# Patient Record
Sex: Male | Born: 1955 | Race: White | Hispanic: No | Marital: Married | Smoking: Current every day smoker
Health system: Southern US, Community
[De-identification: ages and names within clinical notes are randomized; demographics above are authoritative.]

## PROBLEM LIST (undated history)

## (undated) DIAGNOSIS — G510 Bell's palsy: Secondary | ICD-10-CM

## (undated) DIAGNOSIS — I10 Essential (primary) hypertension: Secondary | ICD-10-CM

## (undated) DIAGNOSIS — H409 Unspecified glaucoma: Secondary | ICD-10-CM

## (undated) DIAGNOSIS — J449 Chronic obstructive pulmonary disease, unspecified: Secondary | ICD-10-CM

## (undated) DIAGNOSIS — M419 Scoliosis, unspecified: Secondary | ICD-10-CM

## (undated) DIAGNOSIS — K219 Gastro-esophageal reflux disease without esophagitis: Secondary | ICD-10-CM

## (undated) DIAGNOSIS — B029 Zoster without complications: Secondary | ICD-10-CM

## (undated) DIAGNOSIS — F32A Depression, unspecified: Secondary | ICD-10-CM

## (undated) DIAGNOSIS — J189 Pneumonia, unspecified organism: Secondary | ICD-10-CM

## (undated) DIAGNOSIS — G43909 Migraine, unspecified, not intractable, without status migrainosus: Secondary | ICD-10-CM

## (undated) DIAGNOSIS — I639 Cerebral infarction, unspecified: Secondary | ICD-10-CM

## (undated) DIAGNOSIS — E785 Hyperlipidemia, unspecified: Secondary | ICD-10-CM

## (undated) HISTORY — DX: Cerebral infarction, unspecified: I63.9

## (undated) HISTORY — DX: Zoster without complications: B02.9

## (undated) HISTORY — DX: Pneumonia, unspecified organism: J18.9

---
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
## 2008-05-26 ENCOUNTER — Ambulatory Visit: Payer: Self-pay | Admitting: Family Medicine

## 2008-06-04 ENCOUNTER — Ambulatory Visit: Payer: Self-pay | Admitting: Specialist

## 2008-07-11 ENCOUNTER — Ambulatory Visit: Payer: Self-pay | Admitting: Unknown Physician Specialty

## 2008-07-11 HISTORY — PX: UPPER GI ENDOSCOPY: SHX6162

## 2008-07-11 LAB — HM COLONOSCOPY

## 2008-07-17 ENCOUNTER — Ambulatory Visit: Payer: Self-pay | Admitting: Specialist

## 2013-04-11 LAB — PSA: PSA: 0.6

## 2013-04-11 LAB — TSH: TSH: 1.35 u[IU]/mL (ref 0.41–5.90)

## 2013-06-06 HISTORY — PX: SKIN LESION EXCISION: SHX2412

## 2014-01-26 ENCOUNTER — Emergency Department: Payer: Self-pay | Admitting: Emergency Medicine

## 2014-02-01 ENCOUNTER — Emergency Department: Payer: Self-pay | Admitting: Emergency Medicine

## 2014-02-01 LAB — COMPREHENSIVE METABOLIC PANEL
ALK PHOS: 106 U/L
AST: 22 U/L (ref 15–37)
Albumin: 3.5 g/dL (ref 3.4–5.0)
Anion Gap: 7 (ref 7–16)
BUN: 11 mg/dL (ref 7–18)
Bilirubin,Total: 0.2 mg/dL (ref 0.2–1.0)
CALCIUM: 8.4 mg/dL — AB (ref 8.5–10.1)
CHLORIDE: 103 mmol/L (ref 98–107)
Co2: 30 mmol/L (ref 21–32)
Creatinine: 1.06 mg/dL (ref 0.60–1.30)
EGFR (Non-African Amer.): >60
GLUCOSE: 88 mg/dL (ref 65–99)
OSMOLALITY: 278 (ref 275–301)
Potassium: 3.6 mmol/L (ref 3.5–5.1)
SGPT (ALT): 25 U/L
Sodium: 140 mmol/L (ref 136–145)
Total Protein: 7.5 g/dL (ref 6.4–8.2)

## 2014-02-01 LAB — URINALYSIS, COMPLETE
Bacteria: NONE SEEN
Bilirubin,UR: NEGATIVE
Glucose,UR: NEGATIVE mg/dL (ref 0–75)
Ketone: NEGATIVE
Leukocyte Esterase: NEGATIVE
Nitrite: NEGATIVE
Ph: 7 (ref 4.5–8.0)
Protein: NEGATIVE
SPECIFIC GRAVITY: 1.006 (ref 1.003–1.030)
Squamous Epithelial: NONE SEEN
WBC UR: <1 /HPF (ref 0–5)

## 2014-02-01 LAB — TROPONIN I

## 2014-02-01 LAB — CBC
HCT: 45.1 % (ref 40.0–52.0)
HGB: 14.8 g/dL (ref 13.0–18.0)
MCH: 29.4 pg (ref 26.0–34.0)
MCHC: 32.8 g/dL (ref 32.0–36.0)
MCV: 90 fL (ref 80–100)
Platelet: 335 10*3/uL (ref 150–440)
RBC: 5.02 10*6/uL (ref 4.40–5.90)
RDW: 14.8 % — ABNORMAL HIGH (ref 11.5–14.5)
WBC: 13.9 10*3/uL — ABNORMAL HIGH (ref 3.8–10.6)

## 2014-02-01 LAB — LIPASE, BLOOD: Lipase: 361 U/L (ref 73–393)

## 2014-02-01 LAB — CK TOTAL AND CKMB (NOT AT ARMC): CK, Total: 39 U/L

## 2014-02-06 ENCOUNTER — Emergency Department: Payer: Self-pay | Admitting: Internal Medicine

## 2014-02-26 LAB — LIPID PANEL
CHOLESTEROL: 219 mg/dL — AB (ref 0–200)
HDL: 25 mg/dL — AB (ref 35–70)
LDL Cholesterol: 147 mg/dL
Triglycerides: 237 mg/dL — AB (ref 40–160)

## 2014-05-27 LAB — BASIC METABOLIC PANEL
BUN: 13 mg/dL (ref 4–21)
CREATININE: 1.1 mg/dL (ref 0.6–1.3)
Glucose: 93 mg/dL
POTASSIUM: 4.5 mmol/L (ref 3.4–5.3)
Sodium: 138 mmol/L (ref 137–147)

## 2014-05-27 LAB — HEMOGLOBIN A1C: Hgb A1c MFr Bld: 5.8 % (ref 4.0–6.0)

## 2014-12-24 ENCOUNTER — Ambulatory Visit (INDEPENDENT_AMBULATORY_CARE_PROVIDER_SITE_OTHER): Payer: BLUE CROSS/BLUE SHIELD | Admitting: Family Medicine

## 2014-12-24 ENCOUNTER — Encounter: Payer: Self-pay | Admitting: Family Medicine

## 2014-12-24 VITALS — BP 160/78 | HR 84 | Temp 97.7°F | Resp 16 | Ht 66.0 in | Wt 162.0 lb

## 2014-12-24 DIAGNOSIS — G629 Polyneuropathy, unspecified: Secondary | ICD-10-CM | POA: Insufficient documentation

## 2014-12-24 DIAGNOSIS — E559 Vitamin D deficiency, unspecified: Secondary | ICD-10-CM | POA: Insufficient documentation

## 2014-12-24 DIAGNOSIS — M549 Dorsalgia, unspecified: Secondary | ICD-10-CM

## 2014-12-24 DIAGNOSIS — E785 Hyperlipidemia, unspecified: Secondary | ICD-10-CM | POA: Insufficient documentation

## 2014-12-24 DIAGNOSIS — K2980 Duodenitis without bleeding: Secondary | ICD-10-CM | POA: Insufficient documentation

## 2014-12-24 DIAGNOSIS — M419 Scoliosis, unspecified: Secondary | ICD-10-CM | POA: Insufficient documentation

## 2014-12-24 DIAGNOSIS — G8929 Other chronic pain: Secondary | ICD-10-CM | POA: Insufficient documentation

## 2014-12-24 DIAGNOSIS — F17200 Nicotine dependence, unspecified, uncomplicated: Secondary | ICD-10-CM | POA: Insufficient documentation

## 2014-12-24 DIAGNOSIS — K209 Esophagitis, unspecified without bleeding: Secondary | ICD-10-CM | POA: Insufficient documentation

## 2014-12-24 DIAGNOSIS — Z9889 Other specified postprocedural states: Secondary | ICD-10-CM | POA: Insufficient documentation

## 2014-12-24 DIAGNOSIS — G510 Bell's palsy: Secondary | ICD-10-CM | POA: Insufficient documentation

## 2014-12-24 DIAGNOSIS — R7303 Prediabetes: Secondary | ICD-10-CM | POA: Insufficient documentation

## 2014-12-24 DIAGNOSIS — G43001 Migraine without aura, not intractable, with status migrainosus: Secondary | ICD-10-CM | POA: Diagnosis not present

## 2014-12-24 DIAGNOSIS — G43909 Migraine, unspecified, not intractable, without status migrainosus: Secondary | ICD-10-CM | POA: Insufficient documentation

## 2014-12-24 MED ORDER — KETOROLAC TROMETHAMINE 30 MG/ML IJ SOLN
60.0000 mg | Freq: Once | INTRAMUSCULAR | Status: AC
  Administered 2014-12-24: 60 mg via INTRAMUSCULAR

## 2014-12-24 MED ORDER — KETOROLAC TROMETHAMINE 30 MG/ML IM SOLN: 60.0000 mg | Freq: Once | INTRAMUSCULAR | Status: DC

## 2014-12-24 MED ORDER — OXYCODONE-ACETAMINOPHEN 5-325 MG PO TABS: 1.0000 | ORAL_TABLET | Freq: Four times a day (QID) | ORAL | Status: DC | PRN

## 2014-12-24 MED ORDER — PROMETHAZINE HCL 25 MG/ML IJ SOLN
25.0000 mg | Freq: Once | INTRAMUSCULAR | Status: AC
  Administered 2014-12-24: 25 mg via INTRAMUSCULAR

## 2014-12-24 NOTE — Progress Notes (Signed)
Subjective:    Patient ID: Lucas Ramirez, male    DOB: 16-Feb-1956, 59 y.o.   MRN: [id number]  Headache  Episode onset: x 4 days. The problem occurs constantly. The problem has been gradually worsening. The pain is located in the frontal region. The pain does not radiate. The pain quality is similar to prior headaches. Quality: "a building pressure", intermittent pounding. The pain is at a severity of 9/10. The pain is severe. Associated symptoms include back pain (h/o scoliosis and effusion), eye pain and nausea. Pertinent negatives include no abdominal pain, abnormal behavior, anorexia, blurred vision, coughing, dizziness, drainage, ear pain, eye redness, eye watering, facial sweating, fever, hearing loss, insomnia, loss of balance, muscle aches, neck pain, numbness, phonophobia, photophobia, rhinorrhea, scalp tenderness, seizures, vomiting or weakness. He has tried acetaminophen (Imitrex) for the symptoms. The treatment provided no relief.  Pt reports he took Imitrex (dose unknown) yesterday, with relief. Pt took 2 more Imitrex today, without relief. Pt is experiencing facial assymetry, but has Bell's Palsy.    Review of Systems  Constitutional: Negative for fever.  HENT: Negative for ear pain, hearing loss and rhinorrhea.   Eyes: Positive for pain. Negative for blurred vision, photophobia and redness.  Respiratory: Negative for cough.   Gastrointestinal: Positive for nausea. Negative for vomiting, abdominal pain and anorexia.  Musculoskeletal: Positive for back pain (h/o scoliosis and effusion). Negative for neck pain.  Neurological: Positive for headaches. Negative for dizziness, seizures, weakness, numbness and loss of balance.  Psychiatric/Behavioral: The patient does not have insomnia.    Blood pressure 160/78, pulse 84, temperature 97.7 F (36.5 C), temperature source Oral, resp. rate 16, height 5\' 6"  (1.676 m), weight 162 lb (73.483 kg). s  Patient Active Problem List   Diagnosis  Date Noted  . Back pain, chronic 12/24/2014  . Duodenitis 12/24/2014  . Esophagitis 12/24/2014  . H/O surgical procedure 12/24/2014  . HLD (hyperlipidemia) 12/24/2014  . BP (high blood pressure) 12/24/2014  . Headache, migraine 12/24/2014  . Neuropathy 12/24/2014  . Borderline diabetes 12/24/2014  . Scoliosis 12/24/2014  . Compulsive tobacco user syndrome 12/24/2014  . Avitaminosis D 12/24/2014   No past medical history on file. No current outpatient prescriptions on file prior to visit.   No current facility-administered medications on file prior to visit.   Allergies  Allergen Reactions  . Bupropion Hcl     seizures   No past surgical history on file. Social History   Social History  . Marital Status: Single    Spouse Name: N/A  . Number of Children: N/A  . Years of Education: N/A   Occupational History  . Not on file.   Social History Main Topics  . Smoking status: Current Every Day Smoker -- 1.00 packs/day for 35 years  . Smokeless tobacco: Never Used  . Alcohol Use: No  . Drug Use: No  . Sexual Activity: Not on file   Other Topics Concern  . Not on file   Social History Narrative  . No narrative on file   No family history on file.    Objective:   Physical Exam  Constitutional: He is oriented to person, place, and time. He appears well-developed and well-nourished.  HENT:  Head: Normocephalic and atraumatic.  Right Ear: External ear normal.  Left Ear: External ear normal.  Nose: Nose normal.  Mouth/Throat: Oropharynx is clear and moist.  Neurological: He is alert and oriented to person, place, and time. A cranial nerve deficit (does have some  mild facial droop. ) is present. Coordination normal.  Psychiatric: He has a normal mood and affect. His behavior is normal. Thought content normal.   BP 160/78 mmHg  Pulse 84  Temp(Src) 97.7 F (36.5 C) (Oral)  Resp 16  Ht 5\' 6"  (1.676 m)  Wt 162 lb (73.483 kg)  BMI 26.16 kg/m2     Assessment & Plan:   1. Bell's palsy Stable.   2. Migraine without aura and with status migrainosus, not intractable Had minimal relief with injection.  Will give rx for narcotic, has working in the past. Has had too much Imitrex already today.   Will go to ER if does not improve.  - promethazine (PHENERGAN) injection 25 mg; Inject 1 mL (25 mg total) into the muscle once. - ketorolac (TORADOL) 30 MG/ML injection 60 mg; Inject 2 mLs (60 mg total) into the muscle once. - oxyCODONE-acetaminophen (PERCOCET/ROXICET) 5-325 MG per tablet; Take 1-2 tablets by mouth every 6 (six) hours as needed for severe pain.  Dispense: 30 tablet; Refill: 0  Margarita Rana, MD

## 2015-01-06 ENCOUNTER — Encounter: Payer: Self-pay | Admitting: Family Medicine

## 2015-01-06 ENCOUNTER — Ambulatory Visit (INDEPENDENT_AMBULATORY_CARE_PROVIDER_SITE_OTHER): Payer: BLUE CROSS/BLUE SHIELD | Admitting: Family Medicine

## 2015-01-06 VITALS — BP 146/66 | HR 82 | Temp 98.5°F | Resp 16 | Ht 66.0 in | Wt 162.0 lb

## 2015-01-06 DIAGNOSIS — G43009 Migraine without aura, not intractable, without status migrainosus: Secondary | ICD-10-CM | POA: Diagnosis not present

## 2015-01-06 MED ORDER — SUMATRIPTAN SUCCINATE 50 MG PO TABS: 50.0000 mg | ORAL_TABLET | ORAL | Status: DC | PRN

## 2015-01-06 MED ORDER — FLUOXETINE HCL 20 MG PO TABS: 20.0000 mg | ORAL_TABLET | Freq: Every day | ORAL | Status: DC

## 2015-01-06 NOTE — Progress Notes (Signed)
Patient: Lucas Ramirez Male    DOB: 10/04/1955   59 y.o.   MRN: [id number] Visit Date: 01/06/2015  Today's Provider: Lelon Huh, MD   Chief Complaint  Patient presents with  . Migraine   Subjective:    Migraine  This is a chronic problem. Episode onset: 1-2 hours ago. The problem has been unchanged. The pain is located in the occipital and frontal region. The pain quality is similar to prior headaches. Quality: pressure. Associated symptoms include back pain (lower right side). Pertinent negatives include no abdominal pain, blurred vision, coughing, dizziness, drainage, ear pain, eye pain, eye redness, eye watering, fever, loss of balance, muscle aches, nausea, neck pain, numbness, photophobia, rhinorrhea, scalp tenderness, seizures, sinus pressure, sore throat, swollen glands, tingling, tinnitus, visual change, vomiting or weakness. Exacerbated by: moving and walking around. He has tried nothing for the symptoms. His past medical history is significant for migraine headaches.  Patient comes into today stating that he is not currently experiencing a migraine but feels like one is about to start. Patient usually take Imitrex for Migraines but he reports he has been out of this medication for 2 weeks. Since running out of Imitrex, patient has been using Maxalt which was an old prescription that was prescribed to him in the past.   Patient has remote history of recurrent migraines previously followed by neurologist. Last prophylactic medication was fluoxetine which was effective. He has been off of this medication 12 or 13 years and hadn't had trouble with migraines during all that time. He has had more trouble with back pain secondary to scoliosis the last several months and feels this may be trigger for his migraines.     Allergies  Allergen Reactions  . Bupropion Hcl     seizures   Previous Medications   AMLODIPINE (NORVASC) 10 MG TABLET    Take by mouth.   CHOLECALCIFEROL  (VITAMIN D) 1000 UNITS TABLET    Take 1,000 Units by mouth daily.   LATANOPROST (XALATAN) 0.005 % OPHTHALMIC SOLUTION       MAGNESIUM 500 MG CAPS    Take by mouth.   OMEPRAZOLE 20 MG TBEC    Take by mouth.   OXYCODONE-ACETAMINOPHEN (PERCOCET/ROXICET) 5-325 MG PER TABLET    Take 1-2 tablets by mouth every 6 (six) hours as needed for severe pain.   SUMATRIPTAN SUCCINATE (IMITREX PO)    Take by mouth.    Review of Systems  Constitutional: Negative for fever, chills and appetite change.  HENT: Negative for ear pain, rhinorrhea, sinus pressure, sore throat and tinnitus.   Eyes: Negative for blurred vision, photophobia, pain and redness.  Respiratory: Negative for cough, chest tightness, shortness of breath and wheezing.   Cardiovascular: Negative for chest pain and palpitations.  Gastrointestinal: Negative for nausea, vomiting and abdominal pain.  Musculoskeletal: Positive for back pain (lower right side). Negative for neck pain.  Neurological: Positive for light-headedness and headaches (pressure sensation). Negative for dizziness, tingling, seizures, weakness, numbness and loss of balance.    Social History  Substance Use Topics  . Smoking status: Current Every Day Smoker -- 1.00 packs/day for 35 years  . Smokeless tobacco: Never Used  . Alcohol Use: No   Objective:   BP 146/66 mmHg  Pulse 82  Temp(Src) 98.5 F (36.9 C) (Oral)  Resp 16  Ht 5\' 6"  (1.676 m)  Wt 162 lb (73.483 kg)  BMI 26.16 kg/m2  SpO2 97%  Physical Exam   General  Appearance:    Alert, cooperative, no distress  Eyes:    PERRL, conjunctiva/corneas clear, EOM's intact       Lungs:     Clear to auscultation bilaterally, respirations unlabored  Heart:    Regular rate and rhythm  Neurologic:   Awake, alert, oriented x 3. No apparent focal neurological           defect.           Assessment & Plan:     1. Migraine without aura and without status migrainosus, not intractable Had been in remission for years, but  having relatively frequent headaches since last October. He previously did well with fluoxetine for migraine  Prophylaxis.  - SUMAtriptan (IMITREX) 50 MG tablet; Take 1 tablet (50 mg total) by mouth every 2 (two) hours as needed for migraine. May repeat in 2 hours if headache persists or recurs. No more than 2 in a day  Dispense: 10 tablet; Refill: 5 - FLUoxetine (PROZAC) 20 MG tablet; Take 1 tablet (20 mg total) by mouth daily.  Dispense: 30 tablet; Refill: 3   Call if headaches continue after being back on fluoxetine.       Lelon Huh, MD  [facility]

## 2015-01-20 ENCOUNTER — Telehealth: Payer: Self-pay | Admitting: Family Medicine

## 2015-01-20 MED ORDER — PROPRANOLOL HCL ER 80 MG PO CP24: 80.0000 mg | ORAL_CAPSULE | Freq: Every day | ORAL | Status: DC

## 2015-01-20 NOTE — Telephone Encounter (Signed)
Can change to propranolol LA 80mg  daily, have sent rx to [facility]. Follow up o.v. In 3 weeks.

## 2015-01-20 NOTE — Telephone Encounter (Signed)
Called pt back. Patient stated that Prozac is not really helping with the headaches. Patient stated that he has been taking up to four Imitrex per week. Patient wanted to know if there is something else he can try for headaches. Patient said that he has had a headaches every day, some of which have been exteremly painful. Please advise?

## 2015-01-20 NOTE — Telephone Encounter (Signed)
Pt called saying he is still having lingering headaches most everyday.  Back of head and fore head.   pt's call back is [phone]  Thanks Con Memos

## 2015-01-20 NOTE — Telephone Encounter (Signed)
Patient notified. Expressed understanding.

## 2015-01-21 ENCOUNTER — Emergency Department
Admission: EM | Admit: 2015-01-21 | Discharge: 2015-01-21 | Disposition: A | Discharge status: Home / Self Care | Payer: BLUE CROSS/BLUE SHIELD | Attending: Emergency Medicine | Admitting: Emergency Medicine

## 2015-01-21 ENCOUNTER — Encounter: Payer: Self-pay | Admitting: Emergency Medicine

## 2015-01-21 DIAGNOSIS — G43709 Chronic migraine without aura, not intractable, without status migrainosus: Secondary | ICD-10-CM | POA: Insufficient documentation

## 2015-01-21 DIAGNOSIS — R51 Headache: Secondary | ICD-10-CM | POA: Diagnosis present

## 2015-01-21 DIAGNOSIS — Z79899 Other long term (current) drug therapy: Secondary | ICD-10-CM | POA: Diagnosis not present

## 2015-01-21 DIAGNOSIS — R519 Headache, unspecified: Secondary | ICD-10-CM

## 2015-01-21 DIAGNOSIS — Z720 Tobacco use: Secondary | ICD-10-CM | POA: Insufficient documentation

## 2015-01-21 HISTORY — DX: Migraine, unspecified, not intractable, without status migrainosus: G43.909

## 2015-01-21 MED ORDER — MAGNESIUM SULFATE 2 GM/50ML IV SOLN
2.0000 g | Freq: Once | INTRAVENOUS | Status: AC
  Administered 2015-01-21: 2 g via INTRAVENOUS
  Filled 2015-01-21: qty 50

## 2015-01-21 MED ORDER — DIPHENHYDRAMINE HCL 50 MG/ML IJ SOLN
25.0000 mg | Freq: Once | INTRAMUSCULAR | Status: AC
  Administered 2015-01-21: 25 mg via INTRAVENOUS
  Filled 2015-01-21: qty 1

## 2015-01-21 MED ORDER — DEXAMETHASONE SODIUM PHOSPHATE 10 MG/ML IJ SOLN
10.0000 mg | Freq: Once | INTRAMUSCULAR | Status: AC
  Administered 2015-01-21: 10 mg via INTRAVENOUS
  Filled 2015-01-21: qty 1

## 2015-01-21 MED ORDER — PROCHLORPERAZINE EDISYLATE 5 MG/ML IJ SOLN
10.0000 mg | Freq: Once | INTRAMUSCULAR | Status: AC
  Administered 2015-01-21: 10 mg via INTRAVENOUS
  Filled 2015-01-21: qty 2

## 2015-01-21 MED ORDER — SODIUM CHLORIDE 0.9 % IV BOLUS (SEPSIS)
1000.0000 mL | Freq: Once | INTRAVENOUS | Status: AC
  Administered 2015-01-21: 1000 mL via INTRAVENOUS

## 2015-01-21 NOTE — ED Provider Notes (Addendum)
[facility] Emergency Department Provider Note  ____________________________________________  Time seen: Approximately 7:10 AM  I have reviewed the triage vital signs and the nursing notes.   HISTORY  Chief Complaint Migraine    HPI Lucas Ramirez is a 59 y.o. male with a history of migraines who is presenting today with a chief complaint of migraine headache. He says that his headache started less than at about 10:00 PM last night and reached its maximum intensity about midnight. He says the headache lasted from midnight to 5 AM at a 10 out of 10 which was frontal. He said that he tried Imitrex at home and has been taking a daily Prozac over the past months which has not been relieving his headaches. He says that he has had headaches over his lifetime but they started increasing about a year ago after about a 10 year headache free.Marland Kitchen He says that he has tried multiple medications and recently has doctor as switched him off his Prozac to propranolol and he has not tried to propranolol yet. He has had a CAT scan in the past but this was in early 2000. The CAT scan did not show any acute pathology. He says that he has tried headache cocktails in the past. Emergency department and he has needed a narcotic to finally relieve his pain. He denies any vision changes. Denies any nausea, vomiting, photophobia or phonophobia.Describes the pain as a pressure. Denies any specific dizziness but does say that he feels a little unsteady on his feet when he has these headaches. He thinks that stopping exercise about a month ago increased his headaches.   Past Medical History  Diagnosis Date  . Migraine     Patient Active Problem List   Diagnosis Date Noted  . Back pain, chronic 12/24/2014  . Duodenitis 12/24/2014  . Esophagitis 12/24/2014  . H/O surgical procedure 12/24/2014  . HLD (hyperlipidemia) 12/24/2014  . BP (high blood pressure) 12/24/2014  . Headache, migraine  12/24/2014  . Neuropathy 12/24/2014  . Borderline diabetes 12/24/2014  . Scoliosis 12/24/2014  . Compulsive tobacco user syndrome 12/24/2014  . Avitaminosis D 12/24/2014  . Bell's palsy 12/24/2014    History reviewed. No pertinent past surgical history.  Current Outpatient Rx  Name  Route  Sig  Dispense  Refill  . amLODipine (NORVASC) 10 MG tablet   Oral   Take by mouth.         . cholecalciferol (VITAMIN D) 1000 UNITS tablet   Oral   Take 1,000 Units by mouth daily.         Marland Kitchen FLUoxetine (PROZAC) 20 MG tablet   Oral   Take 1 tablet (20 mg total) by mouth daily.   30 tablet   3   . latanoprost (XALATAN) 0.005 % ophthalmic solution   Both Eyes   Place 1 drop into both eyes at bedtime.          . Magnesium 500 MG CAPS   Oral   Take by mouth.         . Omeprazole 20 MG TBEC   Oral   Take by mouth.         . oxyCODONE-acetaminophen (PERCOCET/ROXICET) 5-325 MG per tablet   Oral   Take 1-2 tablets by mouth every 6 (six) hours as needed for severe pain.   30 tablet   0   . propranolol ER (INDERAL LA) 80 MG 24 hr capsule   Oral   Take 1 capsule (80  mg total) by mouth daily.   30 capsule   1   . rizatriptan (MAXALT-MLT) 5 MG disintegrating tablet   Oral   Take 5 mg by mouth as needed for migraine. May repeat in 2 hours if needed         . SUMAtriptan (IMITREX) 50 MG tablet   Oral   Take 1 tablet (50 mg total) by mouth every 2 (two) hours as needed for migraine. May repeat in 2 hours if headache persists or recurs. No more than 2 in a day   10 tablet   5     Allergies Bupropion hcl  No family history on file.  Social History Social History  Substance Use Topics  . Smoking status: Current Every Day Smoker -- 1.00 packs/day for 35 years  . Smokeless tobacco: Never Used  . Alcohol Use: No    Review of Systems Constitutional: No fever/chills Eyes: No visual changes. ENT: No sore throat. Cardiovascular: Denies chest pain. Respiratory:  Denies shortness of breath. Gastrointestinal: No abdominal pain.  No nausea, no vomiting.  No diarrhea.  No constipation. Genitourinary: Negative for dysuria. Musculoskeletal: Negative for back pain. Skin: Negative for rash. Neurological: Negative for focal weakness or numbness.  10-point ROS otherwise negative.  ____________________________________________   PHYSICAL EXAM:  VITAL SIGNS: ED Triage Vitals  Enc Vitals Group     BP 01/21/15 0651 170/73 mmHg     Pulse Rate 01/21/15 0651 88     Resp 01/21/15 0651 18     Temp 01/21/15 0651 98 F (36.7 C)     Temp Source 01/21/15 0651 Oral     SpO2 01/21/15 0651 98 %     Weight 01/21/15 0651 162 lb (73.483 kg)     Height 01/21/15 0651 5\' 6"  (1.676 m)     Head Cir --      Peak Flow --      Pain Score 01/21/15 0651 9     Pain Loc --      Pain Edu? --      Excl. in [place]? --     Constitutional: Alert and oriented. Well appearing and in no acute distress. Eyes: Conjunctivae are normal. PERRL. EOMI. Head: Atraumatic. Nose: No congestion/rhinnorhea. Mouth/Throat: Mucous membranes are moist.  Oropharynx non-erythematous. Neck: No stridor.   Cardiovascular: Normal rate, regular rhythm. Grossly normal heart sounds.  Good peripheral circulation. No tenderness to the temples or nodularity palpated to the distribution of the temporal arteries. Respiratory: Normal respiratory effort.  No retractions. Lungs CTAB. Gastrointestinal: Soft and nontender. No distention. No abdominal bruits. No CVA tenderness. Musculoskeletal: No lower extremity tenderness nor edema.  No joint effusions. Neurologic:  Normal speech and language. Left-sided peripheral facial droop. Patient says has a history of Bell's palsy and this is his baseline.  Skin:  Skin is warm, dry and intact. No rash noted. Psychiatric: Mood and affect are normal. Speech and behavior are normal.  ____________________________________________   LABS (all labs ordered are listed, but only  abnormal results are displayed)  Labs Reviewed - No data to display ____________________________________________  EKG   ____________________________________________  RADIOLOGY   ____________________________________________   PROCEDURES  ____________________________________________   INITIAL IMPRESSION / ASSESSMENT AND PLAN / ED COURSE  Pertinent labs & imaging results that were available during my care of the patient were reviewed by me and considered in my medical decision making (see chart for details).  ----------------------------------------- 9:15 AM on 01/21/2015 -----------------------------------------  Patient's pain is relieved at this time. Will  not pursue further workup such as imaging or lumbar puncture. Patient says that he is asked to have a lumbar puncture in the past at [facility]. He was switched to propranolol and begin taking this and follow-up with his primary care doctor. He also has a follow-up appointment with neurology on November 1.  FINAL CLINICAL IMPRESSION(S) / ED DIAGNOSES  Acute on chronic migraine headache.   Orbie Pyo, MD 01/21/15 [phone]  No meningismus or pain with range of neck on exam.  Orbie Pyo, MD 01/21/15 [phone]

## 2015-01-21 NOTE — ED Notes (Signed)
Pt presents to ED with c/o "migraine" headache which began at 10pm last night. Pt reports history of similar headaches which begin in his back and are "all over" his head. Pt reports that the quality of pain varies. Pt denies visual change, denies nausea/vomiting, denies sensitivity to light. Pt reports that he took ibuprofen this morning without relief. Pt is awake and alert, speaking in complete, coherent sentences during assessment. Pt with equal grip strength.

## 2015-01-21 NOTE — ED Notes (Signed)
Pt resting in bed with eyes opens. Denies pain at this time.

## 2015-01-21 NOTE — ED Notes (Signed)
Pt assisted to toilet; states he is feeling better and feels like he wants to go home.

## 2015-01-21 NOTE — Discharge Instructions (Signed)
Begin taking your propranolol today. Headaches, Frequently Asked Questions MIGRAINE HEADACHES Q: What is migraine? What causes it? How can I treat it? A: Generally, migraine headaches begin as a dull ache. Then they develop into a constant, throbbing, and pulsating pain. You may experience pain at the temples. You may experience pain at the front or back of one or both sides of the head. The pain is usually accompanied by a combination of:  Nausea.  Vomiting.  Sensitivity to light and noise. Some people (about 15%) experience an aura (see below) before an attack. The cause of migraine is believed to be chemical reactions in the brain. Treatment for migraine may include over-the-counter or prescription medications. It may also include self-help techniques. These include relaxation training and biofeedback.  Q: What is an aura? A: About 15% of people with migraine get an "aura". This is a sign of neurological symptoms that occur before a migraine headache. You may see wavy or jagged lines, dots, or flashing lights. You might experience tunnel vision or blind spots in one or both eyes. The aura can include visual or auditory hallucinations (something imagined). It may include disruptions in smell (such as strange odors), taste or touch. Other symptoms include:  Numbness.  A "pins and needles" sensation.  Difficulty in recalling or speaking the correct word. These neurological events may last as long as 60 minutes. These symptoms will fade as the headache begins. Q: What is a trigger? A: Certain physical or environmental factors can lead to or "trigger" a migraine. These include:  Foods.  Hormonal changes.  Weather.  Stress. It is important to remember that triggers are different for everyone. To help prevent migraine attacks, you need to figure out which triggers affect you. Keep a headache diary. This is a good way to track triggers. The diary will help you talk to your healthcare  professional about your condition. Q: Does weather affect migraines? A: Bright sunshine, hot, humid conditions, and drastic changes in barometric pressure may lead to, or "trigger," a migraine attack in some people. But studies have shown that weather does not act as a trigger for everyone with migraines. Q: What is the link between migraine and hormones? A: Hormones start and regulate many of your body's functions. Hormones keep your body in balance within a constantly changing environment. The levels of hormones in your body are unbalanced at times. Examples are during menstruation, pregnancy, or menopause. That can lead to a migraine attack. In fact, about three quarters of all women with migraine report that their attacks are related to the menstrual cycle.  Q: Is there an increased risk of stroke for migraine sufferers? A: The likelihood of a migraine attack causing a stroke is very remote. That is not to say that migraine sufferers cannot have a stroke associated with their migraines. In persons under age 74, the most common associated factor for stroke is migraine headache. But over the course of a person's normal life span, the occurrence of migraine headache may actually be associated with a reduced risk of dying from cerebrovascular disease due to stroke.  Q: What are acute medications for migraine? A: Acute medications are used to treat the pain of the headache after it has started. Examples over-the-counter medications, NSAIDs, ergots, and triptans.  Q: What are the triptans? A: Triptans are the newest class of abortive medications. They are specifically targeted to treat migraine. Triptans are vasoconstrictors. They moderate some chemical reactions in the brain. The triptans work on receptors  in your brain. Triptans help to restore the balance of a neurotransmitter called serotonin. Fluctuations in levels of serotonin are thought to be a main cause of migraine.  Q: Are over-the-counter  medications for migraine effective? A: Over-the-counter, or "OTC," medications may be effective in relieving mild to moderate pain and associated symptoms of migraine. But you should see your caregiver before beginning any treatment regimen for migraine.  Q: What are preventive medications for migraine? A: Preventive medications for migraine are sometimes referred to as "prophylactic" treatments. They are used to reduce the frequency, severity, and length of migraine attacks. Examples of preventive medications include antiepileptic medications, antidepressants, beta-blockers, calcium channel blockers, and NSAIDs (nonsteroidal anti-inflammatory drugs). Q: Why are anticonvulsants used to treat migraine? A: During the past few years, there has been an increased interest in antiepileptic drugs for the prevention of migraine. They are sometimes referred to as "anticonvulsants". Both epilepsy and migraine may be caused by similar reactions in the brain.  Q: Why are antidepressants used to treat migraine? A: Antidepressants are typically used to treat people with depression. They may reduce migraine frequency by regulating chemical levels, such as serotonin, in the brain.  Q: What alternative therapies are used to treat migraine? A: The term "alternative therapies" is often used to describe treatments considered outside the scope of conventional Western medicine. Examples of alternative therapy include acupuncture, acupressure, and yoga. Another common alternative treatment is herbal therapy. Some herbs are believed to relieve headache pain. Always discuss alternative therapies with your caregiver before proceeding. Some herbal products contain arsenic and other toxins. TENSION HEADACHES Q: What is a tension-type headache? What causes it? How can I treat it? A: Tension-type headaches occur randomly. They are often the result of temporary stress, anxiety, fatigue, or anger. Symptoms include soreness in your  temples, a tightening band-like sensation around your head (a "vice-like" ache). Symptoms can also include a pulling feeling, pressure sensations, and contracting head and neck muscles. The headache begins in your forehead, temples, or the back of your head and neck. Treatment for tension-type headache may include over-the-counter or prescription medications. Treatment may also include self-help techniques such as relaxation training and biofeedback. CLUSTER HEADACHES Q: What is a cluster headache? What causes it? How can I treat it? A: Cluster headache gets its name because the attacks come in groups. The pain arrives with little, if any, warning. It is usually on one side of the head. A tearing or bloodshot eye and a runny nose on the same side of the headache may also accompany the pain. Cluster headaches are believed to be caused by chemical reactions in the brain. They have been described as the most severe and intense of any headache type. Treatment for cluster headache includes prescription medication and oxygen. SINUS HEADACHES Q: What is a sinus headache? What causes it? How can I treat it? A: When a cavity in the bones of the face and skull (a sinus) becomes inflamed, the inflammation will cause localized pain. This condition is usually the result of an allergic reaction, a tumor, or an infection. If your headache is caused by a sinus blockage, such as an infection, you will probably have a fever. An x-ray will confirm a sinus blockage. Your caregiver's treatment might include antibiotics for the infection, as well as antihistamines or decongestants.  REBOUND HEADACHES Q: What is a rebound headache? What causes it? How can I treat it? A: A pattern of taking acute headache medications too often can lead to a  condition known as "rebound headache." A pattern of taking too much headache medication includes taking it more than 2 days per week or in excessive amounts. That means more than the label or a  caregiver advises. With rebound headaches, your medications not only stop relieving pain, they actually begin to cause headaches. Doctors treat rebound headache by tapering the medication that is being overused. Sometimes your caregiver will gradually substitute a different type of treatment or medication. Stopping may be a challenge. Regularly overusing a medication increases the potential for serious side effects. Consult a caregiver if you regularly use headache medications more than 2 days per week or more than the label advises. ADDITIONAL QUESTIONS AND ANSWERS Q: What is biofeedback? A: Biofeedback is a self-help treatment. Biofeedback uses special equipment to monitor your body's involuntary physical responses. Biofeedback monitors:  Breathing.  Pulse.  Heart rate.  Temperature.  Muscle tension.  Brain activity. Biofeedback helps you refine and perfect your relaxation exercises. You learn to control the physical responses that are related to stress. Once the technique has been mastered, you do not need the equipment any more. Q: Are headaches hereditary? A: Four out of five (80%) of people that suffer report a family history of migraine. Scientists are not sure if this is genetic or a family predisposition. Despite the uncertainty, a child has a 50% chance of having migraine if one parent suffers. The child has a 75% chance if both parents suffer.  Q: Can children get headaches? A: By the time they reach high school, most young people have experienced some type of headache. Many safe and effective approaches or medications can prevent a headache from occurring or stop it after it has begun.  Q: What type of doctor should I see to diagnose and treat my headache? A: Start with your primary caregiver. Discuss his or her experience and approach to headaches. Discuss methods of classification, diagnosis, and treatment. Your caregiver may decide to recommend you to a headache specialist,  depending upon your symptoms or other physical conditions. Having diabetes, allergies, etc., may require a more comprehensive and inclusive approach to your headache. The National Head[employer] Foundation will provide, upon request, a list of [facility] physician members in your state. Document Released: 07/02/2003 Document Revised: 07/04/2011 Document Reviewed: 12/10/2007 [facility] Patient Information 2015 [place], [place]. This information is not intended to replace advice given to you by your health care provider. Make sure you discuss any questions you have with your health care provider.

## 2015-01-21 NOTE — ED Notes (Signed)
Pt resting in bed with eyes closed. NAD noted at this time. Awakens to mild stimuli.

## 2015-01-21 NOTE — ED Notes (Signed)
Pt to triage via w/c with no distress noted; pt reports migraine since 10pm with no accomp symptoms; st hx of same; took ibuprofen this am without relief

## 2015-01-21 NOTE — ED Notes (Signed)
Report given to Megan, RN.

## 2015-01-21 NOTE — ED Notes (Signed)
NAD noted at this time. Pt denies comments/concerns at this time. Pt refused wheelchair to the lobby.

## 2015-01-30 ENCOUNTER — Encounter: Payer: Self-pay | Admitting: Family Medicine

## 2015-01-30 ENCOUNTER — Ambulatory Visit (INDEPENDENT_AMBULATORY_CARE_PROVIDER_SITE_OTHER): Payer: BLUE CROSS/BLUE SHIELD | Admitting: Family Medicine

## 2015-01-30 VITALS — BP 164/88 | HR 69 | Temp 97.7°F | Resp 20 | Wt 158.0 lb

## 2015-01-30 DIAGNOSIS — G43009 Migraine without aura, not intractable, without status migrainosus: Secondary | ICD-10-CM | POA: Diagnosis not present

## 2015-01-30 DIAGNOSIS — G43001 Migraine without aura, not intractable, with status migrainosus: Secondary | ICD-10-CM

## 2015-01-30 MED ORDER — SUMATRIPTAN SUCCINATE 100 MG PO TABS: 100.0000 mg | ORAL_TABLET | ORAL | Status: DC | PRN

## 2015-01-30 MED ORDER — OXYCODONE-ACETAMINOPHEN 5-325 MG PO TABS: 1.0000 | ORAL_TABLET | Freq: Four times a day (QID) | ORAL | Status: DC | PRN

## 2015-01-30 MED ORDER — PROMETHAZINE HCL 25 MG/ML IJ SOLN
25.0000 mg | Freq: Once | INTRAMUSCULAR | Status: AC
  Administered 2015-01-30: 25 mg via INTRAMUSCULAR

## 2015-01-30 MED ORDER — KETOROLAC TROMETHAMINE 60 MG/2ML IM SOLN
60.0000 mg | Freq: Once | INTRAMUSCULAR | Status: AC
  Administered 2015-01-30: 60 mg via INTRAMUSCULAR

## 2015-01-30 MED ORDER — PROPRANOLOL HCL ER 120 MG PO CP24: 120.0000 mg | ORAL_CAPSULE | Freq: Every day | ORAL | Status: DC

## 2015-01-30 NOTE — Progress Notes (Signed)
Patient: Lucas Ramirez Male    DOB: 10-15-1955   59 y.o.   MRN: [id number] Visit Date: 01/30/2015  Today's Provider: Lelon Huh, MD   Chief Complaint  Patient presents with  . Migraine    x 1 day   Subjective:    Migraine  This is a chronic problem. The current episode started yesterday. The problem occurs constantly. The problem has been gradually worsening. Pain location: all over. The pain does not radiate. The pain quality is similar to prior headaches. Quality: pressure and throbbing. The pain is at a severity of 10/10. The pain is severe. Associated symptoms include back pain, dizziness (with walking) and nausea (now resolved). Pertinent negatives include no abdominal pain, abnormal behavior, anorexia, blurred vision, coughing, drainage, ear pain, eye pain, eye redness, eye watering, facial sweating, fever, hearing loss, insomnia, loss of balance, muscle aches, neck pain, numbness, photophobia, rhinorrhea, seizures, sinus pressure, sore throat, swollen glands, tingling, tinnitus, visual change, vomiting, weakness or weight loss. Treatments tried: Maxalt and Imitrex. The treatment provided no relief.  Patient was last seen 3 weeks ago for Migraines. Patient was started on Prozac and Imitrex. Patient states the Prozac didn't help so he was changed to Propranolol. Patient comes in today with a severe headache. Patient has not been using Maxalt. He states he has been taking 2 of the Imitrex at a time. Last dose of Imitrex was 2:30am. Headaches have been less frequent since change to propranolol, but still very intense.      Allergies  Allergen Reactions  . Bupropion Hcl     seizures   Previous Medications   AMLODIPINE (NORVASC) 10 MG TABLET    Take by mouth.   CHOLECALCIFEROL (VITAMIN D) 1000 UNITS TABLET    Take 1,000 Units by mouth daily.   LATANOPROST (XALATAN) 0.005 % OPHTHALMIC SOLUTION    Place 1 drop into both eyes at bedtime.    MAGNESIUM 500 MG CAPS    Take by mouth.    OMEPRAZOLE 20 MG TBEC    Take by mouth.   PROPRANOLOL ER (INDERAL LA) 80 MG 24 HR CAPSULE    Take 1 capsule (80 mg total) by mouth daily.   SUMATRIPTAN (IMITREX) 50 MG TABLET    Take 1 tablet (50 mg total) by mouth every 2 (two) hours as needed for migraine. May repeat in 2 hours if headache persists or recurs. No more than 2 in a day    Review of Systems  Constitutional: Negative for fever, chills, weight loss, diaphoresis, appetite change and fatigue.  HENT: Negative for ear pain, hearing loss, rhinorrhea, sinus pressure, sore throat and tinnitus.   Eyes: Negative for blurred vision, photophobia, pain and redness.  Respiratory: Negative for cough, chest tightness, shortness of breath and wheezing.   Cardiovascular: Negative for chest pain and palpitations.  Gastrointestinal: Positive for nausea (now resolved). Negative for vomiting, abdominal pain and anorexia.  Musculoskeletal: Positive for back pain. Negative for neck pain.  Neurological: Positive for dizziness (with walking) and headaches. Negative for tingling, seizures, weakness, numbness and loss of balance.  Psychiatric/Behavioral: The patient does not have insomnia.     Social History  Substance Use Topics  . Smoking status: Current Every Day Smoker -- 1.00 packs/day for 35 years  . Smokeless tobacco: Never Used  . Alcohol Use: No   Objective:   BP 164/88 mmHg  Pulse 69  Temp(Src) 97.7 F (36.5 C) (Oral)  Resp 20  Wt 158 lb (  71.668 kg)  SpO2 97%  Physical Exam   General Appearance:    Alert, cooperative, no distress  Eyes:    PERRL, conjunctiva/corneas clear, EOM's intact       Lungs:     Clear to auscultation bilaterally, respirations unlabored  Heart:    Regular rate and rhythm  Neurologic:   Awake, alert, oriented x 3. No apparent focal neurological           defect.           Assessment & Plan:     1. Migraine without aura and with status migrainosus, not intractable  - oxyCODONE-acetaminophen  (PERCOCET/ROXICET) 5-325 MG tablet; Take 1-2 tablets by mouth every 6 (six) hours as needed for severe pain.  Dispense: 30 tablet; Refill: 0 - promethazine (PHENERGAN) injection 25 mg; Inject 1 mL (25 mg total) into the muscle once. - ketorolac (TORADOL) injection 60 mg; Inject 2 mLs (60 mg total) into the muscle once.  2. Migraine without aura and without status migrainosus, not intractable Increase propranolol and follow up 2 weeks for better prophylaxis. Increase Imitrex. Follow up in 2-3 weeks.   - propranolol ER (INDERAL LA) 120 MG 24 hr capsule; Take 1 capsule (120 mg total) by mouth daily.  Dispense: 30 capsule; Refill: 1 - SUMAtriptan (IMITREX) 100 MG tablet; Take 1 tablet (100 mg total) by mouth every 2 (two) hours as needed for migraine (no more than 2 tablets in a day). May repeat in 2 hours if headache persists or recurs. No more than 2 in a day  Dispense: 9 tablet; Refill: 1       Lelon Huh, MD  [facility]

## 2015-02-10 ENCOUNTER — Other Ambulatory Visit: Payer: Self-pay | Admitting: *Deleted

## 2015-02-10 MED ORDER — AMLODIPINE BESYLATE 10 MG PO TABS: 10.0000 mg | ORAL_TABLET | Freq: Every day | ORAL | Status: DC

## 2015-03-03 ENCOUNTER — Other Ambulatory Visit: Payer: Self-pay | Admitting: *Deleted

## 2015-03-03 DIAGNOSIS — G43009 Migraine without aura, not intractable, without status migrainosus: Secondary | ICD-10-CM

## 2015-03-03 MED ORDER — PROPRANOLOL HCL ER 120 MG PO CP24: 120.0000 mg | ORAL_CAPSULE | Freq: Every day | ORAL | Status: DC

## 2015-03-04 ENCOUNTER — Other Ambulatory Visit: Payer: Self-pay | Admitting: Family Medicine

## 2015-03-04 DIAGNOSIS — G43009 Migraine without aura, not intractable, without status migrainosus: Secondary | ICD-10-CM

## 2015-03-04 MED ORDER — PROPRANOLOL HCL ER 120 MG PO CP24: 120.0000 mg | ORAL_CAPSULE | Freq: Every day | ORAL | Status: DC

## 2015-03-05 ENCOUNTER — Other Ambulatory Visit: Payer: Self-pay | Admitting: *Deleted

## 2015-03-05 DIAGNOSIS — G43009 Migraine without aura, not intractable, without status migrainosus: Secondary | ICD-10-CM

## 2015-03-05 MED ORDER — SUMATRIPTAN SUCCINATE 100 MG PO TABS: 100.0000 mg | ORAL_TABLET | ORAL | Status: DC | PRN

## 2015-04-01 ENCOUNTER — Encounter: Payer: Self-pay | Admitting: Family Medicine

## 2015-04-01 ENCOUNTER — Ambulatory Visit (INDEPENDENT_AMBULATORY_CARE_PROVIDER_SITE_OTHER): Payer: BLUE CROSS/BLUE SHIELD | Admitting: Family Medicine

## 2015-04-01 VITALS — BP 130/78 | HR 64 | Temp 97.8°F | Resp 16 | Ht 66.0 in | Wt 161.0 lb

## 2015-04-01 DIAGNOSIS — G43009 Migraine without aura, not intractable, without status migrainosus: Secondary | ICD-10-CM | POA: Diagnosis not present

## 2015-04-01 DIAGNOSIS — G43001 Migraine without aura, not intractable, with status migrainosus: Secondary | ICD-10-CM

## 2015-04-01 MED ORDER — PROPRANOLOL HCL ER 160 MG PO CP24: 160.0000 mg | ORAL_CAPSULE | Freq: Every day | ORAL | Status: DC

## 2015-04-01 MED ORDER — OXYCODONE-ACETAMINOPHEN 5-325 MG PO TABS: 1.0000 | ORAL_TABLET | Freq: Four times a day (QID) | ORAL | Status: DC | PRN

## 2015-04-01 NOTE — Progress Notes (Signed)
Patient: Lucas Ramirez Male    DOB: [birth year]   59 y.o.   MRN: [id number] Visit Date: 04/01/2015  Today's Provider: Lelon Huh, MD   Chief Complaint  Patient presents with  . Follow-up  . Migraine   Subjective:    HPI  Follow-up for migraine: Patient was seen for migraine 01/30/2015; increased propranolol to 120 x1 qd and also oxyCODONE-acetaminophen (PERCOCET/ROXICET) 5-325 MG tablet; Take 1-2 tablets by mouth every 6 (six) hours as needed for severe pain. Headaches have become much less frequent. He was having headaches every day, but is now only having one per week. Headaches usually resolve with one Imetrex followed by 1-2 Percocets.     Allergies  Allergen Reactions  . Bupropion Hcl     seizures   Previous Medications   AMLODIPINE (NORVASC) 10 MG TABLET    Take 1 tablet (10 mg total) by mouth daily.   CHOLECALCIFEROL (VITAMIN D) 1000 UNITS TABLET    Take 1,000 Units by mouth daily.   LATANOPROST (XALATAN) 0.005 % OPHTHALMIC SOLUTION    Place 1 drop into both eyes at bedtime.    MAGNESIUM 500 MG CAPS    Take 1 capsule by mouth daily.    OMEPRAZOLE 20 MG TBEC    Take 1 tablet by mouth daily.    OXYCODONE-ACETAMINOPHEN (PERCOCET/ROXICET) 5-325 MG TABLET    Take 1-2 tablets by mouth every 6 (six) hours as needed for severe pain.   PROPRANOLOL ER (INDERAL LA) 120 MG 24 HR CAPSULE    Take 1 capsule (120 mg total) by mouth daily.   SUMATRIPTAN (IMITREX) 100 MG TABLET    Take 1 tablet (100 mg total) by mouth every 2 (two) hours as needed for migraine (no more than 2 tablets in a day). May repeat in 2 hours if headache persists or recurs. No more than 2 in a day    Review of Systems  Constitutional: Negative for fever, chills and appetite change.  Respiratory: Negative for chest tightness, shortness of breath and wheezing.   Cardiovascular: Negative for chest pain and palpitations.  Gastrointestinal: Negative for nausea, vomiting and abdominal pain.  Neurological:  Positive for headaches. Negative for dizziness and light-headedness.    Social History  Substance Use Topics  . Smoking status: Current Every Day Smoker -- 1.00 packs/day for 35 years  . Smokeless tobacco: Never Used  . Alcohol Use: No   Objective:   BP 130/78 mmHg  Pulse 64  Temp(Src) 97.8 F (36.6 C) (Oral)  Resp 16  Ht 5\' 6"  (1.676 m)  Wt 161 lb (73.029 kg)  BMI 26.00 kg/m2  SpO2 98%  Physical Exam   General Appearance:    Alert, cooperative, no distress  Eyes:    PERRL, conjunctiva/corneas clear, EOM's intact       Lungs:     Clear to auscultation bilaterally, respirations unlabored  Heart:    Regular rate and rhythm  Neurologic:   Awake, alert, oriented x 3. No apparent focal neurological           defect.          Assessment & Plan:     1. Migraine without aura and without status migrainosus, not intractable Headache frequency improved, but still having at least one a week. Will increase betablocker.  - propranolol ER (INDERAL LA) 160 MG SR capsule; Take 1 capsule (160 mg total) by mouth daily.  Dispense: 30 capsule; Refill: 12  - oxyCODONE-acetaminophen (PERCOCET/ROXICET) 5-325  MG tablet; Take 1-2 tablets by mouth every 6 (six) hours as needed for severe pain.  Dispense: 30 tablet; Refill: 0       Lelon Huh, MD  [facility]

## 2015-05-13 ENCOUNTER — Other Ambulatory Visit: Payer: Self-pay | Admitting: Family Medicine

## 2015-05-13 DIAGNOSIS — G43001 Migraine without aura, not intractable, with status migrainosus: Secondary | ICD-10-CM

## 2015-05-13 MED ORDER — OXYCODONE-ACETAMINOPHEN 5-325 MG PO TABS: 1.0000 | ORAL_TABLET | Freq: Four times a day (QID) | ORAL | Status: DC | PRN

## 2015-05-13 NOTE — Telephone Encounter (Signed)
Pt contacted office for refill request on the following medications:  oxyCODONE-acetaminophen (PERCOCET/ROXICET) 5-325 MG tablet.  [id number]

## 2015-05-20 ENCOUNTER — Other Ambulatory Visit: Payer: Self-pay

## 2015-05-20 DIAGNOSIS — G43009 Migraine without aura, not intractable, without status migrainosus: Secondary | ICD-10-CM

## 2015-05-20 MED ORDER — SUMATRIPTAN SUCCINATE 100 MG PO TABS: 100.0000 mg | ORAL_TABLET | ORAL | Status: DC | PRN

## 2015-05-20 NOTE — Telephone Encounter (Signed)
Pharmacy sent fax requesting refill. 

## 2015-05-27 ENCOUNTER — Other Ambulatory Visit: Payer: Self-pay | Admitting: Neurology

## 2015-05-27 DIAGNOSIS — G43711 Chronic migraine without aura, intractable, with status migrainosus: Secondary | ICD-10-CM

## 2015-06-03 ENCOUNTER — Ambulatory Visit: Payer: BLUE CROSS/BLUE SHIELD

## 2015-06-28 ENCOUNTER — Inpatient Hospital Stay
Admission: EM | Admit: 2015-06-28 | Discharge: 2015-07-02 | DRG: 190 | Disposition: A | Discharge status: Home / Self Care | Payer: BLUE CROSS/BLUE SHIELD | Attending: Internal Medicine | Admitting: Internal Medicine

## 2015-06-28 ENCOUNTER — Encounter: Payer: Self-pay | Admitting: Emergency Medicine

## 2015-06-28 ENCOUNTER — Emergency Department: Payer: BLUE CROSS/BLUE SHIELD

## 2015-06-28 DIAGNOSIS — G43909 Migraine, unspecified, not intractable, without status migrainosus: Secondary | ICD-10-CM | POA: Diagnosis present

## 2015-06-28 DIAGNOSIS — R0602 Shortness of breath: Secondary | ICD-10-CM

## 2015-06-28 DIAGNOSIS — I10 Essential (primary) hypertension: Secondary | ICD-10-CM | POA: Diagnosis present

## 2015-06-28 DIAGNOSIS — H409 Unspecified glaucoma: Secondary | ICD-10-CM | POA: Diagnosis present

## 2015-06-28 DIAGNOSIS — J441 Chronic obstructive pulmonary disease with (acute) exacerbation: Secondary | ICD-10-CM

## 2015-06-28 DIAGNOSIS — J9601 Acute respiratory failure with hypoxia: Secondary | ICD-10-CM | POA: Diagnosis present

## 2015-06-28 DIAGNOSIS — J440 Chronic obstructive pulmonary disease with acute lower respiratory infection: Secondary | ICD-10-CM | POA: Diagnosis present

## 2015-06-28 DIAGNOSIS — K219 Gastro-esophageal reflux disease without esophagitis: Secondary | ICD-10-CM | POA: Diagnosis present

## 2015-06-28 DIAGNOSIS — E785 Hyperlipidemia, unspecified: Secondary | ICD-10-CM | POA: Diagnosis present

## 2015-06-28 DIAGNOSIS — F17200 Nicotine dependence, unspecified, uncomplicated: Secondary | ICD-10-CM | POA: Diagnosis present

## 2015-06-28 DIAGNOSIS — Z8279 Family history of other congenital malformations, deformations and chromosomal abnormalities: Secondary | ICD-10-CM

## 2015-06-28 DIAGNOSIS — J189 Pneumonia, unspecified organism: Secondary | ICD-10-CM

## 2015-06-28 DIAGNOSIS — J9600 Acute respiratory failure, unspecified whether with hypoxia or hypercapnia: Secondary | ICD-10-CM

## 2015-06-28 HISTORY — DX: Essential (primary) hypertension: I10

## 2015-06-28 HISTORY — DX: Pneumonia, unspecified organism: J18.9

## 2015-06-28 HISTORY — DX: Chronic obstructive pulmonary disease, unspecified: J44.9

## 2015-06-28 LAB — INFLUENZA PANEL BY PCR (TYPE A & B)
H1N1FLUPCR: NOT DETECTED
INFLBPCR: NEGATIVE
Influenza A By PCR: NEGATIVE

## 2015-06-28 LAB — TROPONIN I: Troponin I: <0.03 ng/mL (ref <0.031)

## 2015-06-28 LAB — COMPREHENSIVE METABOLIC PANEL
ALK PHOS: 79 U/L (ref 38–126)
ALT: 21 U/L (ref 17–63)
ANION GAP: 10 (ref 5–15)
AST: 27 U/L (ref 15–41)
Albumin: 3.8 g/dL (ref 3.5–5.0)
BUN: 21 mg/dL — ABNORMAL HIGH (ref 6–20)
CALCIUM: 8 mg/dL — AB (ref 8.9–10.3)
CO2: 28 mmol/L (ref 22–32)
CREATININE: 1.17 mg/dL (ref 0.61–1.24)
Chloride: 94 mmol/L — ABNORMAL LOW (ref 101–111)
GLUCOSE: 127 mg/dL — AB (ref 65–99)
Potassium: 4.2 mmol/L (ref 3.5–5.1)
Sodium: 132 mmol/L — ABNORMAL LOW (ref 135–145)
TOTAL PROTEIN: 7.1 g/dL (ref 6.5–8.1)
Total Bilirubin: 0.8 mg/dL (ref 0.3–1.2)

## 2015-06-28 LAB — CBC WITH DIFFERENTIAL/PLATELET
BASOS ABS: 0 10*3/uL (ref 0–0.1)
BASOS PCT: 0 %
EOS PCT: 0 %
Eosinophils Absolute: 0 10*3/uL (ref 0–0.7)
HEMATOCRIT: 40.1 % (ref 40.0–52.0)
Hemoglobin: 13.6 g/dL (ref 13.0–18.0)
LYMPHS PCT: 8 %
Lymphs Abs: 0.9 10*3/uL — ABNORMAL LOW (ref 1.0–3.6)
MCH: 29.4 pg (ref 26.0–34.0)
MCHC: 33.8 g/dL (ref 32.0–36.0)
MCV: 87 fL (ref 80.0–100.0)
MONO ABS: 1.4 10*3/uL — AB (ref 0.2–1.0)
MONOS PCT: 13 %
NEUTROS ABS: 8 10*3/uL — AB (ref 1.4–6.5)
Neutrophils Relative %: 79 %
PLATELETS: 194 10*3/uL (ref 150–440)
RBC: 4.61 MIL/uL (ref 4.40–5.90)
RDW: 13.9 % (ref 11.5–14.5)
WBC: 10.2 10*3/uL (ref 3.8–10.6)

## 2015-06-28 LAB — GLUCOSE, CAPILLARY
GLUCOSE-CAPILLARY: 143 mg/dL — AB (ref 65–99)
Glucose-Capillary: 162 mg/dL — ABNORMAL HIGH (ref 65–99)

## 2015-06-28 LAB — RAPID INFLUENZA A&B ANTIGENS (ARMC ONLY)
INFLUENZA A (ARMC): NEGATIVE
INFLUENZA B (ARMC): NEGATIVE

## 2015-06-28 MED ORDER — VITAMIN D 1000 UNITS PO TABS
1000.0000 [IU] | ORAL_TABLET | Freq: Every day | ORAL | Status: DC
  Administered 2015-06-28 – 2015-07-02 (×5): 1000 [IU] via ORAL
  Filled 2015-06-28 (×5): qty 1

## 2015-06-28 MED ORDER — SODIUM CHLORIDE 0.9 % IV BOLUS (SEPSIS)
500.0000 mL | Freq: Once | INTRAVENOUS | Status: AC
  Administered 2015-06-28: 500 mL via INTRAVENOUS

## 2015-06-28 MED ORDER — BISACODYL 10 MG RE SUPP: 10.0000 mg | Freq: Every day | RECTAL | Status: DC | PRN

## 2015-06-28 MED ORDER — OXYCODONE-ACETAMINOPHEN 5-325 MG PO TABS: 1.0000 | ORAL_TABLET | Freq: Four times a day (QID) | ORAL | Status: DC | PRN

## 2015-06-28 MED ORDER — DEXTROSE 5 % IV SOLN
1.0000 g | INTRAVENOUS | Status: DC
  Administered 2015-06-29 – 2015-07-02 (×4): 1 g via INTRAVENOUS
  Filled 2015-06-28 (×4): qty 10

## 2015-06-28 MED ORDER — ASPIRIN EC 81 MG PO TBEC
81.0000 mg | DELAYED_RELEASE_TABLET | Freq: Every day | ORAL | Status: DC
  Administered 2015-06-28 – 2015-07-02 (×5): 81 mg via ORAL
  Filled 2015-06-28 (×5): qty 1

## 2015-06-28 MED ORDER — ACETAMINOPHEN 325 MG PO TABS: 650.0000 mg | ORAL_TABLET | Freq: Four times a day (QID) | ORAL | Status: DC | PRN

## 2015-06-28 MED ORDER — MORPHINE SULFATE (PF) 2 MG/ML IV SOLN: 2.0000 mg | INTRAVENOUS | Status: DC | PRN

## 2015-06-28 MED ORDER — VITAMIN B-12 1000 MCG PO TABS
1000.0000 ug | ORAL_TABLET | Freq: Every day | ORAL | Status: DC
  Administered 2015-06-28 – 2015-07-02 (×5): 1000 ug via ORAL
  Filled 2015-06-28 (×5): qty 1

## 2015-06-28 MED ORDER — MOMETASONE FURO-FORMOTEROL FUM 200-5 MCG/ACT IN AERO
2.0000 | INHALATION_SPRAY | Freq: Two times a day (BID) | RESPIRATORY_TRACT | Status: DC
  Administered 2015-06-28 – 2015-06-29 (×3): 2 via RESPIRATORY_TRACT
  Filled 2015-06-28: qty 8.8

## 2015-06-28 MED ORDER — IPRATROPIUM-ALBUTEROL 0.5-2.5 (3) MG/3ML IN SOLN
3.0000 mL | Freq: Once | RESPIRATORY_TRACT | Status: AC
  Administered 2015-06-28: 3 mL via RESPIRATORY_TRACT

## 2015-06-28 MED ORDER — DOCUSATE SODIUM 100 MG PO CAPS
100.0000 mg | ORAL_CAPSULE | Freq: Two times a day (BID) | ORAL | Status: DC
  Administered 2015-06-28 – 2015-07-02 (×9): 100 mg via ORAL
  Filled 2015-06-28 (×9): qty 1

## 2015-06-28 MED ORDER — DEXTROSE 5 % IV SOLN
1.0000 g | Freq: Once | INTRAVENOUS | Status: AC
  Administered 2015-06-28: 1 g via INTRAVENOUS
  Filled 2015-06-28: qty 10

## 2015-06-28 MED ORDER — PNEUMOCOCCAL VAC POLYVALENT 25 MCG/0.5ML IJ INJ
0.5000 mL | INJECTION | INTRAMUSCULAR | Status: AC
  Administered 2015-06-30: 0.5 mL via INTRAMUSCULAR
  Filled 2015-06-28: qty 0.5

## 2015-06-28 MED ORDER — IPRATROPIUM-ALBUTEROL 0.5-2.5 (3) MG/3ML IN SOLN
3.0000 mL | Freq: Once | RESPIRATORY_TRACT | Status: AC
  Administered 2015-06-28: 3 mL via RESPIRATORY_TRACT
  Filled 2015-06-28: qty 3

## 2015-06-28 MED ORDER — LATANOPROST 0.005 % OP SOLN
1.0000 [drp] | Freq: Every day | OPHTHALMIC | Status: DC
  Administered 2015-06-28 – 2015-07-01 (×4): 1 [drp] via OPHTHALMIC
  Filled 2015-06-28: qty 2.5

## 2015-06-28 MED ORDER — ONDANSETRON HCL 4 MG/2ML IJ SOLN: 4.0000 mg | Freq: Four times a day (QID) | INTRAMUSCULAR | Status: DC | PRN

## 2015-06-28 MED ORDER — ONDANSETRON HCL 4 MG PO TABS: 4.0000 mg | ORAL_TABLET | Freq: Four times a day (QID) | ORAL | Status: DC | PRN

## 2015-06-28 MED ORDER — MAGNESIUM 500 MG PO CAPS: 1.0000 | ORAL_CAPSULE | Freq: Every day | ORAL | Status: DC

## 2015-06-28 MED ORDER — PANTOPRAZOLE SODIUM 40 MG PO TBEC
40.0000 mg | DELAYED_RELEASE_TABLET | Freq: Every day | ORAL | Status: DC
  Administered 2015-06-28 – 2015-07-02 (×5): 40 mg via ORAL
  Filled 2015-06-28 (×5): qty 1

## 2015-06-28 MED ORDER — AMLODIPINE BESYLATE 10 MG PO TABS
10.0000 mg | ORAL_TABLET | Freq: Every day | ORAL | Status: DC
  Administered 2015-06-28 – 2015-07-02 (×5): 10 mg via ORAL
  Filled 2015-06-28 (×5): qty 1

## 2015-06-28 MED ORDER — METHYLPREDNISOLONE SODIUM SUCC 125 MG IJ SOLR
60.0000 mg | Freq: Four times a day (QID) | INTRAMUSCULAR | Status: DC
  Administered 2015-06-28 – 2015-07-01 (×13): 60 mg via INTRAVENOUS
  Filled 2015-06-28 (×13): qty 2

## 2015-06-28 MED ORDER — MAGNESIUM OXIDE 400 (241.3 MG) MG PO TABS
400.0000 mg | ORAL_TABLET | Freq: Every day | ORAL | Status: DC
  Administered 2015-06-28 – 2015-07-02 (×5): 400 mg via ORAL
  Filled 2015-06-28 (×6): qty 1

## 2015-06-28 MED ORDER — GUAIFENESIN ER 600 MG PO TB12
1200.0000 mg | ORAL_TABLET | Freq: Two times a day (BID) | ORAL | Status: DC
  Administered 2015-06-28 – 2015-07-02 (×9): 1200 mg via ORAL
  Filled 2015-06-28 (×11): qty 2

## 2015-06-28 MED ORDER — SODIUM CHLORIDE 0.9 % IV SOLN
INTRAVENOUS | Status: DC
  Administered 2015-06-28 – 2015-06-29 (×3): via INTRAVENOUS

## 2015-06-28 MED ORDER — HEPARIN SODIUM (PORCINE) 5000 UNIT/ML IJ SOLN
5000.0000 [IU] | Freq: Three times a day (TID) | INTRAMUSCULAR | Status: DC
  Administered 2015-06-28 – 2015-06-29 (×4): 5000 [IU] via SUBCUTANEOUS
  Filled 2015-06-28 (×4): qty 1

## 2015-06-28 MED ORDER — METHYLPREDNISOLONE SODIUM SUCC 125 MG IJ SOLR
125.0000 mg | Freq: Once | INTRAMUSCULAR | Status: AC
  Administered 2015-06-28: 125 mg via INTRAVENOUS
  Filled 2015-06-28: qty 2

## 2015-06-28 MED ORDER — IPRATROPIUM-ALBUTEROL 0.5-2.5 (3) MG/3ML IN SOLN
3.0000 mL | Freq: Four times a day (QID) | RESPIRATORY_TRACT | Status: DC
  Administered 2015-06-28 – 2015-07-01 (×13): 3 mL via RESPIRATORY_TRACT
  Filled 2015-06-28 (×13): qty 3

## 2015-06-28 MED ORDER — PROPRANOLOL HCL ER 80 MG PO CP24
160.0000 mg | ORAL_CAPSULE | Freq: Every day | ORAL | Status: DC
  Administered 2015-06-28: 160 mg via ORAL
  Filled 2015-06-28 (×2): qty 2
  Filled 2015-06-28: qty 1

## 2015-06-28 MED ORDER — IPRATROPIUM-ALBUTEROL 0.5-2.5 (3) MG/3ML IN SOLN
RESPIRATORY_TRACT | Status: AC
  Filled 2015-06-28: qty 3

## 2015-06-28 MED ORDER — AZITHROMYCIN 500 MG IV SOLR
500.0000 mg | INTRAVENOUS | Status: DC
  Administered 2015-06-29 – 2015-07-01 (×3): 500 mg via INTRAVENOUS
  Filled 2015-06-28 (×3): qty 500

## 2015-06-28 MED ORDER — DEXTROSE 5 % IV SOLN
500.0000 mg | Freq: Once | INTRAVENOUS | Status: AC
  Administered 2015-06-28: 500 mg via INTRAVENOUS
  Filled 2015-06-28: qty 500

## 2015-06-28 MED ORDER — SUMATRIPTAN SUCCINATE 100 MG PO TABS
100.0000 mg | ORAL_TABLET | ORAL | Status: DC | PRN
  Filled 2015-06-28: qty 1

## 2015-06-28 MED ORDER — INFLUENZA VAC SPLIT QUAD 0.5 ML IM SUSY
0.5000 mL | PREFILLED_SYRINGE | INTRAMUSCULAR | Status: AC
  Administered 2015-06-30: 0.5 mL via INTRAMUSCULAR
  Filled 2015-06-28: qty 0.5

## 2015-06-28 MED ORDER — ACETAMINOPHEN 650 MG RE SUPP: 650.0000 mg | Freq: Four times a day (QID) | RECTAL | Status: DC | PRN

## 2015-06-28 NOTE — Progress Notes (Signed)
Influenza PCR Negative  Droplet isolation d/c

## 2015-06-28 NOTE — ED Notes (Addendum)
Pt reports waking up this morning and felling really confused and SOB. PT reports recent flu symptoms. O2 sat 76% Triage.

## 2015-06-28 NOTE — H&P (Signed)
History and Physical    ABDULAHI RODICK [id number] DOB: 01-27-1956 DOA: 06/28/2015  Referring physician: Dr. Edd Fabian PCP: Lelon Huh, MD  Specialists: none  Chief Complaint: SOB  HPI: KENNET ALWORTH is a 60 y.o. male has a past medical history significant for COPD/tobacco use, HTN, migraines, and reflux now with 3-4 day hx of SOB and dry cough with nausea. No fever. Denies CP or palpitations. No vomiting or diarrhea. In Er, pt markedly hypoxic in moderate respiratory distress. Minimal improvement with SVN's and IV steroids. Still requiring O2. He is now admitted.  Review of Systems: The patient denies anorexia, fever, weight loss,, vision loss, decreased hearing, hoarseness, chest pain, syncope,  peripheral edema, balance deficits, hemoptysis, abdominal pain, melena, hematochezia, severe indigestion/heartburn, hematuria, incontinence, genital sores, muscle weakness, suspicious skin lesions, transient blindness, difficulty walking, depression, unusual weight change, abnormal bleeding, enlarged lymph nodes, angioedema, and breast masses.   Past Medical History  Diagnosis Date  . Migraine   . Hypertension   . COPD (chronic obstructive pulmonary disease) [facility])    Past Surgical History  Procedure Laterality Date  . Skin lesion excision  06/06/2013    facial left cheek. Dr. Evorn Gong  . Back surgery  1963    correcting Scoliosils per pt  . Upper gi endoscopy  07/11/2008    [facility]. Dr. Donnella Sham. Impression. -LA Grade C reflux esophagitis. 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   Social History:  reports that he has been smoking.  He has never used smokeless tobacco. He reports that he does not drink alcohol or use illicit drugs.  Allergies  Allergen Reactions  . Bupropion Hcl     seizures    Family History  Problem Relation Age of Onset  . Down syndrome Sister   . CAD Neg Hx   . Heart disease Neg Hx   . Colon cancer Neg Hx   . Prostate cancer Neg Hx      Prior to Admission medications   Medication Sig Start Date End Date Taking? Authorizing Provider  amLODipine (NORVASC) 10 MG tablet Take 1 tablet (10 mg total) by mouth daily. 02/10/15  Yes Birdie Sons, MD  propranolol ER (INDERAL LA) 160 MG SR capsule Take 1 capsule (160 mg total) by mouth daily. 04/01/15  Yes Birdie Sons, MD  cholecalciferol (VITAMIN D) 1000 UNITS tablet Take 1,000 Units by mouth daily.    Historical Provider, MD  latanoprost (XALATAN) 0.005 % ophthalmic solution Place 1 drop into both eyes at bedtime.     Historical Provider, MD  Magnesium 500 MG CAPS Take 1 capsule by mouth daily.     Historical Provider, MD  Omeprazole 20 MG TBEC Take 1 tablet by mouth daily.     Historical Provider, MD  oxyCODONE-acetaminophen (PERCOCET/ROXICET) 5-325 MG tablet Take 1-2 tablets by mouth every 6 (six) hours as needed for severe pain. 05/13/15   Birdie Sons, MD  SUMAtriptan (IMITREX) 100 MG tablet Take 1 tablet (100 mg total) by mouth every 2 (two) hours as needed. May repeat in 2 hours. No more than 2 in a day 05/20/15   Birdie Sons, MD  vitamin B-12 (CYANOCOBALAMIN) 500 MCG tablet Take 500 mcg by mouth 2 (two) times daily.    Historical Provider, MD   Physical Exam: Filed Vitals:   06/28/15 0726 06/28/15 0730 06/28/15 0934 06/28/15 1000  BP: 157/98 159/90 115/79 124/63  Pulse: 86 84 71 66  Temp:      TempSrc:  Resp: 17 15    Height: 5\' 9"  (1.753 m)     Weight: 75.9 kg (167 lb 5.3 oz)     SpO2: 97% 100% 95% 95%     General:  Lethargic, in moderate respiratory distress, [place]/AT, WDWN  Eyes: PERRL, EOMI, no scleral icterus, conjunctiva clear  ENT: moist oropharynx without exudate or erythema, TM's benign  Neck: supple, no lymphadenopathy. No bruits or thyromegaly  Cardiovascular: regular rate without MRG; 2+ peripheral pulses, no JVD, no peripheral edema  Respiratory: decreased breath sounds with diffuse wheezes and rhonchi. No dullness. No rales.  Respiratory effort increased  Abdomen: soft, non tender to palpation, positive bowel sounds, no guarding, no rebound. No organomegaly  Skin: no rashes or lesions  Musculoskeletal: normal bulk and tone, no joint swelling  Psychiatric: normal mood and affect, A&OX3  Neurologic: CN 2-12 grossly intact, Motor strength 5/5 in all 4 groups with normal sensory exam and symmetric DTR's  Labs on Admission:  Basic Metabolic Panel:  Recent Labs Lab 06/28/15 0837  NA 132*  K 4.2  CL 94*  CO2 28  GLUCOSE 127*  BUN 21*  CREATININE 1.17  CALCIUM 8.0*   Liver Function Tests:  Recent Labs Lab 06/28/15 0837  AST 27  ALT 21  ALKPHOS 79  BILITOT 0.8  PROT 7.1  ALBUMIN 3.8   No results for input(s): LIPASE, AMYLASE in the last 168 hours. No results for input(s): AMMONIA in the last 168 hours. CBC:  Recent Labs Lab 06/28/15 0837  WBC 10.2  NEUTROABS 8.0*  HGB 13.6  HCT 40.1  MCV 87.0  PLT 194   Cardiac Enzymes:  Recent Labs Lab 06/28/15 0837  TROPONINI <0.03    BNP (last 3 results) No results for input(s): BNP in the last 8760 hours.  ProBNP (last 3 results) No results for input(s): PROBNP in the last 8760 hours.  CBG: No results for input(s): GLUCAP in the last 168 hours.  Radiological Exams on Admission: Dg Chest 2 View  06/28/2015  CLINICAL DATA:  shob and nonproductive cough x 3 days; pt states he doesn't know if he's had a fever; smoker EXAM: CHEST - 2 VIEW COMPARISON:  02/01/2014 FINDINGS: Focal airspace consolidation in the anterior right upper lobe, new since previous. Chronic right pleural effusion or pleural thickening with atelectasis/ consolidation at the right lung base. Patchy left perihilar infiltrates, new since previous. No pneumothorax. Atheromatous aorta. Heart size upper limits normal. Moderate thoracic dextroscoliosis. IMPRESSION: 1. New anterior right upper lobe airspace disease in left perihilar infiltrates. 2. Chronic right pleural  effusion/thickening with adjacent atelectasis/consolidation. Electronically Signed   By: Lucrezia Europe M.D.   On: 06/28/2015 08:34    EKG: Independently reviewed.  Assessment/Plan Principal Problem:   Acute respiratory failure (HCC) Active Problems:   CAP (community acquired pneumonia)   COPD exacerbation ([place])   HTN (hypertension)   Will admit to floor with O2, IV steroids, IV ABX, and SVN's. Cultures sent. Wean O2 as tolerated. Repeat labs and CXR in AM.  Diet: low sodium Fluids: NS@100  DVT Prophylaxis: SQ Heparin  Code Status: FULL  Family Communication: none  Disposition Plan: home  Time spent: 50 min

## 2015-06-28 NOTE — ED Provider Notes (Signed)
[facility] Emergency Department Provider Note  ____________________________________________  Time seen: Approximately 7:39 AM  I have reviewed the triage vital signs and the nursing notes.   HISTORY  Chief Complaint Altered Mental Status and Shortness of Breath    HPI Lucas Ramirez is a 60 y.o. male with history of migraines, hypertension, hyperlipidemia, tobacco use who presents for evaluation of shortness of breath today as well as confusion, gradual onset this morning, constant since onset, now improving with oxygen. The patient reports that he has had flulike symptoms for the past several days including some cough and dry heaving yesterday evening. No diarrhea, no chest pain, no abdominal pain. He denies fevers. This morning when he awoke from sleep his significant other noted that he appeared to be short of breath and he was confused. On arrival to the emergency department, O2 saturation was 76% on room air and his sensorium improved with oxygen.   Past Medical History  Diagnosis Date  . Migraine   . Hypertension     Patient Active Problem List   Diagnosis Date Noted  . Back pain, chronic 12/24/2014  . Duodenitis 12/24/2014  . Esophagitis 12/24/2014  . H/O surgical procedure 12/24/2014  . HLD (hyperlipidemia) 12/24/2014  . Hypertension 12/24/2014  . Headache, migraine 12/24/2014  . Neuropathy ([place]) 12/24/2014  . Borderline diabetes 12/24/2014  . Scoliosis 12/24/2014  . Compulsive tobacco user syndrome 12/24/2014  . Vitamin D deficiency 12/24/2014  . Bell's palsy 12/24/2014    Past Surgical History  Procedure Laterality Date  . Skin lesion excision  06/06/2013    facial left cheek. Dr. Evorn Gong  . Back surgery  1963    correcting Scoliosils per pt  . Upper gi endoscopy  07/11/2008    [facility]. Dr. Donnella Sham. Impression. -LA Grade C reflux esophagitis. 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    Current Outpatient Rx   Name  Route  Sig  Dispense  Refill  . amLODipine (NORVASC) 10 MG tablet   Oral   Take 1 tablet (10 mg total) by mouth daily.   30 tablet   12   . cholecalciferol (VITAMIN D) 1000 UNITS tablet   Oral   Take 1,000 Units by mouth daily.         Marland Kitchen latanoprost (XALATAN) 0.005 % ophthalmic solution   Both Eyes   Place 1 drop into both eyes at bedtime.          . Magnesium 500 MG CAPS   Oral   Take 1 capsule by mouth daily.          . Omeprazole 20 MG TBEC   Oral   Take 1 tablet by mouth daily.          Marland Kitchen oxyCODONE-acetaminophen (PERCOCET/ROXICET) 5-325 MG tablet   Oral   Take 1-2 tablets by mouth every 6 (six) hours as needed for severe pain.   30 tablet   0   . propranolol ER (INDERAL LA) 160 MG SR capsule   Oral   Take 1 capsule (160 mg total) by mouth daily.   30 capsule   12     Please cancel refill for 120mg  propranolol   . SUMAtriptan (IMITREX) 100 MG tablet   Oral   Take 1 tablet (100 mg total) by mouth every 2 (two) hours as needed. May repeat in 2 hours. No more than 2 in a day   9 tablet   5   . vitamin B-12 (CYANOCOBALAMIN) 500 MCG tablet  Oral   Take 500 mcg by mouth 2 (two) times daily.           Allergies Bupropion hcl  Family History  Problem Relation Age of Onset  . Down syndrome Sister   . CAD Neg Hx   . Heart disease Neg Hx   . Colon cancer Neg Hx   . Prostate cancer Neg Hx     Social History Social History  Substance Use Topics  . Smoking status: Current Every Day Smoker -- 1.00 packs/day for 35 years  . Smokeless tobacco: Never Used  . Alcohol Use: No    Review of Systems Constitutional: No fever/chills Eyes: No visual changes. ENT: No sore throat. Cardiovascular: Denies chest pain. Respiratory: + shortness of breath. Gastrointestinal: No abdominal pain.  + nausea, no vomiting.  No diarrhea.  No constipation. Genitourinary: Negative for dysuria. Musculoskeletal: Negative for back pain. Skin: Negative for  rash. Neurological: Negative for headaches, focal weakness or numbness.  10-point ROS otherwise negative.  ____________________________________________   PHYSICAL EXAM:  VITAL SIGNS: ED Triage Vitals  Enc Vitals Group     BP 06/28/15 0726 157/98 mmHg     Pulse Rate 06/28/15 0720 87     Resp 06/28/15 0720 20     Temp 06/28/15 0720 98.1 F (36.7 C)     Temp Source 06/28/15 0720 Oral     SpO2 06/28/15 0720 76 %     Weight 06/28/15 0720 160 lb (72.576 kg)     Height 06/28/15 0720 5\' 6"  (1.676 m)     Head Cir --      Peak Flow --      Pain Score --      Pain Loc --      Pain Edu? --      Excl. in [place]? --     Constitutional: Alert and oriented x 4 though mildly confused at times. In mild to moderate respiratory distress. Eyes: Conjunctivae are normal. PERRL. EOMI. Head: Atraumatic. Nose: No congestion/rhinnorhea. Mouth/Throat: Mucous membranes are moist.  Oropharynx non-erythematous. Neck: No stridor. Supple without meningismus. Cardiovascular: Normal rate, regular rhythm. Grossly normal heart sounds.  Good peripheral circulation. Respiratory: Diffuse expiratory wheeze with moderate air movement. Prolonged expiratory phase. Mild tachypnea. Mild increased work of breathing. Gastrointestinal: Soft and nontender. No distention. No CVA tenderness. Genitourinary: Deferred Musculoskeletal: No lower extremity tenderness nor edema.  No joint effusions. Neurologic:  Normal speech and language. Chronic left facial droop secondary to Bell's palsy. 5 out of 5 strength in bilateral upper and lower extremities, sensation intact to light touch in the extremities. Skin:  Skin is warm, dry and intact. No rash noted. Psychiatric: Mood and affect are normal. Speech and behavior are normal.  ____________________________________________   LABS (all labs ordered are listed, but only abnormal results are displayed)  Labs Reviewed  CBC WITH DIFFERENTIAL/PLATELET - Abnormal; Notable for the  following:    Neutro Abs 8.0 (*)    Lymphs Abs 0.9 (*)    Monocytes Absolute 1.4 (*)    All other components within normal limits  COMPREHENSIVE METABOLIC PANEL - Abnormal; Notable for the following:    Sodium 132 (*)    Chloride 94 (*)    Glucose, Bld 127 (*)    BUN 21 (*)    Calcium 8.0 (*)    All other components within normal limits  RAPID INFLUENZA A&B ANTIGENS ([facility] ONLY)  CULTURE, BLOOD (ROUTINE X 2)  CULTURE, BLOOD (ROUTINE X 2)  TROPONIN I  ____________________________________________  EKG  ED ECG REPORT I, Joanne Gavel, the attending physician, personally viewed and interpreted this ECG.   Date: 06/28/2015  EKG Time: 07:40  Rate: 86  Rhythm: normal sinus rhythm  Axis: normal  Intervals:nonspecific intraventricular conduction delay  ST&T Change: No acute ST elevation.  ____________________________________________  RADIOLOGY  CXR IMPRESSION: 1. New anterior right upper lobe airspace disease in left perihilar infiltrates. 2. Chronic right pleural effusion/thickening with adjacent atelectasis/consolidation. ____________________________________________   PROCEDURES  Procedure(s) performed: None  Critical Care performed: No  ____________________________________________   INITIAL IMPRESSION / ASSESSMENT AND PLAN / ED COURSE  Pertinent labs & imaging results that were available during my care of the patient were reviewed by me and considered in my medical decision making (see chart for details).  Lucas Ramirez is a 60 y.o. male with history of migraines, hypertension, hyperlipidemia, tobacco use who presents for evaluation of shortness of breath today as well as confusion. On exam, he is alert and oriented 4 though very mildly confused sometimes having trouble getting his words out. No aphasia, no dysarthria. He has chronic left facial droop which he reports is secondary to Bell's palsy but intact sensation and strength in the extremities. He does have  expiratory wheeze with mildly increased work of breathing concerning for reactive airway disease. O2 sat is 98% on 6 L supplemental oxygen. He has no formal diagnosis of asthma or COPD but is a heavy smoker. We'll give DuoNeb treatments, steroids, obtain screening labs, chest x-ray, screen for flu. Reassess for disposition.  ----------------------------------------- 10:10 AM on 06/28/2015 ----------------------------------------- Wheezing improved at this time. Labs reviewed and are generally unremarkable. Influenza negative. Chest x-ray shows pneumonia. We'll give IV ceftriaxone and azithromycin. Case discussed with hospitalist, Dr. Doy Hutching, for admission given pneumonia with new oxygen requirement.  ____________________________________________   FINAL CLINICAL IMPRESSION(S) / ED DIAGNOSES  Final diagnoses:  SOB (shortness of breath)  Community acquired pneumonia      Joanne Gavel, MD 06/28/15 1010

## 2015-06-29 ENCOUNTER — Inpatient Hospital Stay: Payer: BLUE CROSS/BLUE SHIELD

## 2015-06-29 LAB — COMPREHENSIVE METABOLIC PANEL
ALBUMIN: 3.6 g/dL (ref 3.5–5.0)
ALK PHOS: 69 U/L (ref 38–126)
ALT: 19 U/L (ref 17–63)
ANION GAP: 9 (ref 5–15)
AST: 27 U/L (ref 15–41)
BUN: 26 mg/dL — ABNORMAL HIGH (ref 6–20)
CO2: 27 mmol/L (ref 22–32)
Calcium: 8.2 mg/dL — ABNORMAL LOW (ref 8.9–10.3)
Chloride: 99 mmol/L — ABNORMAL LOW (ref 101–111)
Creatinine, Ser: 0.95 mg/dL (ref 0.61–1.24)
GFR calc Af Amer: >60 mL/min (ref >60)
GFR calc non Af Amer: >60 mL/min (ref >60)
GLUCOSE: 146 mg/dL — AB (ref 65–99)
POTASSIUM: 4.2 mmol/L (ref 3.5–5.1)
SODIUM: 135 mmol/L (ref 135–145)
Total Bilirubin: 0.5 mg/dL (ref 0.3–1.2)
Total Protein: 6.9 g/dL (ref 6.5–8.1)

## 2015-06-29 LAB — CBC
HEMATOCRIT: 36.4 % — AB (ref 40.0–52.0)
HEMOGLOBIN: 12.5 g/dL — AB (ref 13.0–18.0)
MCH: 30.1 pg (ref 26.0–34.0)
MCHC: 34.5 g/dL (ref 32.0–36.0)
MCV: 87.3 fL (ref 80.0–100.0)
Platelets: 179 10*3/uL (ref 150–440)
RBC: 4.17 MIL/uL — ABNORMAL LOW (ref 4.40–5.90)
RDW: 13.6 % (ref 11.5–14.5)
WBC: 7.6 10*3/uL (ref 3.8–10.6)

## 2015-06-29 LAB — GLUCOSE, CAPILLARY
Glucose-Capillary: 152 mg/dL — ABNORMAL HIGH (ref 65–99)
Glucose-Capillary: 138 mg/dL — ABNORMAL HIGH (ref 65–99)

## 2015-06-29 MED ORDER — BUDESONIDE 0.25 MG/2ML IN SUSP
0.2500 mg | Freq: Two times a day (BID) | RESPIRATORY_TRACT | Status: DC
  Administered 2015-06-29 – 2015-07-02 (×6): 0.25 mg via RESPIRATORY_TRACT
  Filled 2015-06-29 (×6): qty 2

## 2015-06-29 MED ORDER — ENOXAPARIN SODIUM 40 MG/0.4ML ~~LOC~~ SOLN
40.0000 mg | SUBCUTANEOUS | Status: DC
  Administered 2015-06-29 – 2015-07-01 (×3): 40 mg via SUBCUTANEOUS
  Filled 2015-06-29 (×3): qty 0.4

## 2015-06-29 MED ORDER — PROPRANOLOL HCL ER 80 MG PO CP24
160.0000 mg | ORAL_CAPSULE | Freq: Every day | ORAL | Status: DC
  Administered 2015-06-29 – 2015-07-01 (×3): 160 mg via ORAL
  Filled 2015-06-29 (×4): qty 2

## 2015-06-29 NOTE — Progress Notes (Signed)
Patient ID: Lucas Ramirez, male   DOB: 1956-03-02, 60 y.o.   MRN: [id number] [facility] PROGRESS NOTE  Lucas Ramirez [name] DOB: 08/19/1955 DOA: 06/28/2015 PCP: Lucas Huh, MD  HPI/Subjective: Patient's shortness of breath going on for a while now. He's been coughing and wheezing and does not feel well.  Objective: Filed Vitals:   06/29/15 0355 06/29/15 0803  BP: 144/74 132/63  Pulse: 78 76  Temp: 98 F (36.7 C) 97.7 F (36.5 C)  Resp: 18 18    Filed Weights   06/28/15 0720 06/28/15 0726 06/28/15 1613  Weight: 72.576 kg (160 lb) 75.9 kg (167 lb 5.3 oz) 75.751 kg (167 lb)    ROS: Review of Systems  Constitutional: Negative for fever and chills.  Eyes: Negative for blurred vision.  Respiratory: Positive for cough, shortness of breath and wheezing.   Cardiovascular: Negative for chest pain.  Gastrointestinal: Negative for nausea, vomiting, abdominal pain, diarrhea and constipation.  Genitourinary: Negative for dysuria.  Musculoskeletal: Negative for joint pain.  Neurological: Negative for dizziness and headaches.   Exam: Physical Exam  Constitutional: He is oriented to person, place, and time.  HENT:  Nose: No mucosal edema.  Mouth/Throat: No oropharyngeal exudate or posterior oropharyngeal edema.  Eyes: Conjunctivae, EOM and lids are normal. Pupils are equal, round, and reactive to light.  Neck: No JVD present. Carotid bruit is not present. No edema present. No thyroid mass and no thyromegaly present.  Cardiovascular: S1 normal and S2 normal.  Exam reveals no gallop.   No murmur heard. Pulses:      Dorsalis pedis pulses are 2+ on the right side, and 2+ on the left side.  Respiratory: No respiratory distress. He has decreased breath sounds in the right upper field, the right middle field, the right lower field, the left upper field, the left middle field and the left lower field. He has wheezes in the right upper field, the right middle field, the  right lower field, the left upper field, the left middle field and the left lower field. He has no rhonchi. He has no rales.  Audible wheeze heard without the stethoscope  GI: Soft. Bowel sounds are normal. There is no tenderness.  Musculoskeletal:       Right ankle: He exhibits no swelling.       Left ankle: He exhibits no swelling.  Lymphadenopathy:    He has no cervical adenopathy.  Neurological: He is alert and oriented to person, place, and time. No cranial nerve deficit.  Skin: Skin is warm. No rash noted. Nails show no clubbing.  Psychiatric: He has a normal mood and affect.      Data Reviewed: Basic Metabolic Panel:  Recent Labs Lab 06/28/15 0837 06/29/15 0356  NA 132* 135  K 4.2 4.2  CL 94* 99*  CO2 28 27  GLUCOSE 127* 146*  BUN 21* 26*  CREATININE 1.17 0.95  CALCIUM 8.0* 8.2*   Liver Function Tests:  Recent Labs Lab 06/28/15 0837 06/29/15 0356  AST 27 27  ALT 21 19  ALKPHOS 79 69  BILITOT 0.8 0.5  PROT 7.1 6.9  ALBUMIN 3.8 3.6   CBC:  Recent Labs Lab 06/28/15 0837 06/29/15 0356  WBC 10.2 7.6  NEUTROABS 8.0*  --   HGB 13.6 12.5*  HCT 40.1 36.4*  MCV 87.0 87.3  PLT 194 179   Cardiac Enzymes:  Recent Labs Lab 06/28/15 0837  TROPONINI <0.03   CBG:  Recent Labs Lab 06/28/15 1145 06/28/15  2054 06/29/15 0804 06/29/15 1113  GLUCAP 143* 162* 152* 138*    Recent Results (from the past 240 hour(s))  Rapid Influenza A&B Antigens ([facility] only)     Status: None   Collection Time: 06/28/15  8:35 AM  Result Value Ref Range Status   Influenza A ([facility]) NEGATIVE NEGATIVE Final   Influenza B ([facility]) NEGATIVE NEGATIVE Final  Blood culture (routine x 2)     Status: None (Preliminary result)   Collection Time: 06/28/15  8:35 AM  Result Value Ref Range Status   Specimen Description BLOOD LEFT HAND  Final   Special Requests BOTTLES DRAWN AEROBIC AND ANAEROBIC  1CC  Final   Culture NO GROWTH < 24 HOURS  Final   Report Status PENDING  Incomplete   Blood culture (routine x 2)     Status: None (Preliminary result)   Collection Time: 06/28/15  8:37 AM  Result Value Ref Range Status   Specimen Description BLOOD RIGHT HAND  Final   Special Requests   Final    BOTTLES DRAWN AEROBIC AND ANAEROBIC  AER 5CC ANA 3CC   Culture NO GROWTH < 24 HOURS  Final   Report Status PENDING  Incomplete     Studies: Dg Chest 2 View  06/29/2015  CLINICAL DATA:  Cough.  Shortness of breath. EXAM: CHEST  2 VIEW COMPARISON:  06/28/2015.  02/01/2014 .  CT 05/26/2008. FINDINGS: Mediastinum hilar structures are normal. Heart size is stable. No pulmonary venous congestion. Persistent but partially clearing right base infiltrate. Interim near complete clearing of left perihilar infiltrate . Persistent small right pleural thickening consistent with scarring. No pneumothorax . IMPRESSION: 1. Persistent but partially clearing right base infiltrate. Interim near complete clearing of left perihilar infiltrate. 2. Right pleural thickening again noted consistent with scarring. Electronically Signed   By: Marcello Moores  Register   On: 06/29/2015 07:43   Dg Chest 2 View  06/28/2015  CLINICAL DATA:  shob and nonproductive cough x 3 days; pt states he doesn't know if he's had a fever; smoker EXAM: CHEST - 2 VIEW COMPARISON:  02/01/2014 FINDINGS: Focal airspace consolidation in the anterior right upper lobe, new since previous. Chronic right pleural effusion or pleural thickening with atelectasis/ consolidation at the right lung base. Patchy left perihilar infiltrates, new since previous. No pneumothorax. Atheromatous aorta. Heart size upper limits normal. Moderate thoracic dextroscoliosis. IMPRESSION: 1. New anterior right upper lobe airspace disease in left perihilar infiltrates. 2. Chronic right pleural effusion/thickening with adjacent atelectasis/consolidation. Electronically Signed   By: Lucrezia Europe M.D.   On: 06/28/2015 08:34    Scheduled Meds: . amLODipine  10 mg Oral Daily  . aspirin  EC  81 mg Oral Daily  . azithromycin  500 mg Intravenous Q24H  . budesonide (PULMICORT) nebulizer solution  0.25 mg Nebulization BID  . cefTRIAXone (ROCEPHIN)  IV  1 g Intravenous Q24H  . cholecalciferol  1,000 Units Oral Daily  . docusate sodium  100 mg Oral BID  . enoxaparin (LOVENOX) injection  40 mg Subcutaneous Q24H  . guaiFENesin  1,200 mg Oral BID  . Influenza vac split quadrivalent PF  0.5 mL Intramuscular Tomorrow-1000  . ipratropium-albuterol  3 mL Nebulization QID  . latanoprost  1 drop Both Eyes QHS  . magnesium oxide  400 mg Oral Daily  . methylPREDNISolone (SOLU-MEDROL) injection  60 mg Intravenous Q6H  . pantoprazole  40 mg Oral Daily  . pneumococcal 23 valent vaccine  0.5 mL Intramuscular Tomorrow-1000  . propranolol ER  160  mg Oral QHS  . vitamin B-12  1,000 mcg Oral Daily    Assessment/Plan:  1. Acute respiratory failure with hypoxia. The patient was on 5 L of oxygen when I saw him and I dialed down to 3 L. Continue oxygen supplementation as long as it's warranted. Since the patient came in without oxygen and I like to send him home without oxygen. 2. COPD exacerbation with continued tobacco abuse. High-dose Solu-Medrol. Nebulizer treatments and add budesonide nebulizers. Patient must stop smoking. Refused nicotine patch. 3. Bilateral community-acquired pneumonia Rocephin and Zithromax. 4. Hypertension essential continue usual medications  5. Glaucoma unspecified continue latanoprost follow-up with eye doctor as outpatient 6. History migraine on propranolol ER for prevention  Code Status:     Code Status Orders        Start     Ordered   06/28/15 1232  Full code   Continuous     06/28/15 1231    Code Status History    Date Active Date Inactive Code Status Order ID Comments User Context   This patient has a current code status but no historical code status.    Advance Directive Documentation        Most Recent Value   Type of Advance Directive  Living  will   Pre-existing out of facility DNR order (yellow form or pink MOST form)     "MOST" Form in Place?       Family Communication: Wife at the bedside  Disposition Plan: Home once breathing better   Antibiotics:  Rocephin  Zithromax  Time spent: 25 minutes  Loletha Grayer  [facility] [employer]

## 2015-06-29 NOTE — Progress Notes (Signed)
Pt has episodes of anxiety.  He is able to relax/calm himself.  Continues to having rhonchi/wheezing.  Wife in and out of the room to visit. Oxygen at 3 liters

## 2015-06-30 LAB — GLUCOSE, CAPILLARY: GLUCOSE-CAPILLARY: 124 mg/dL — AB (ref 65–99)

## 2015-06-30 MED ORDER — IPRATROPIUM-ALBUTEROL 0.5-2.5 (3) MG/3ML IN SOLN
3.0000 mL | RESPIRATORY_TRACT | Status: DC | PRN
  Administered 2015-06-30 – 2015-07-01 (×2): 3 mL via RESPIRATORY_TRACT
  Filled 2015-06-30 (×2): qty 3

## 2015-06-30 NOTE — Progress Notes (Signed)
SATURATION QUALIFICATIONS: (This note is used to comply with regulatory documentation for home oxygen)  Patient Saturations on Room Air at Rest = 91%  Patient Saturations on Room Air while Ambulating = 87%  Patient Saturations on 2 Liters of oxygen while Ambulating = 93%  Patient Saturations on 2 Liters of oxygen at Rest = 96%  Please briefly explain why patient needs home oxygen: COPD

## 2015-06-30 NOTE — Progress Notes (Signed)
Patient ID: Lucas Ramirez, male   DOB: [birth year], 60 y.o.   MRN: [id number]  [facility] PROGRESS NOTE  Lucas Ramirez [name] DOB: 1955/11/01 DOA: 06/28/2015 PCP: Lelon Huh, MD  HPI/Subjective: Patient still wheezing and short of breath and coughing. Feels a little bit better than yesterday with regards to his air entry. Still has an audible wheeze  Objective: Filed Vitals:   06/30/15 0322 06/30/15 0751  BP: 155/76 151/67  Pulse: 75 71  Temp: 97.3 F (36.3 C) 97.6 F (36.4 C)  Resp: 20 21    Filed Weights   06/28/15 0726 06/28/15 1613 06/30/15 0322  Weight: 75.9 kg (167 lb 5.3 oz) 75.751 kg (167 lb) 74.163 kg (163 lb 8 oz)    ROS: Review of Systems  Constitutional: Negative for fever and chills.  Eyes: Negative for blurred vision.  Respiratory: Positive for cough, shortness of breath and wheezing.   Cardiovascular: Negative for chest pain.  Gastrointestinal: Negative for nausea, vomiting, abdominal pain, diarrhea and constipation.  Genitourinary: Negative for dysuria.  Musculoskeletal: Negative for joint pain.  Neurological: Negative for dizziness and headaches.   Exam: Physical Exam  Constitutional: He is oriented to person, place, and time.  HENT:  Nose: No mucosal edema.  Mouth/Throat: No oropharyngeal exudate or posterior oropharyngeal edema.  Eyes: Conjunctivae, EOM and lids are normal. Pupils are equal, round, and reactive to light.  Neck: No JVD present. Carotid bruit is not present. No edema present. No thyroid mass and no thyromegaly present.  Cardiovascular: S1 normal and S2 normal.  Exam reveals no gallop.   No murmur heard. Pulses:      Dorsalis pedis pulses are 2+ on the right side, and 2+ on the left side.  Respiratory: No respiratory distress. He has decreased breath sounds in the right middle field, the right lower field, the left middle field and the left lower field. He has wheezes in the right middle field, the right lower  field, the left middle field and the left lower field. He has no rhonchi. He has no rales.  Audible wheeze heard without the stethoscope but less than yesterday.  GI: Soft. Bowel sounds are normal. There is no tenderness.  Musculoskeletal:       Right ankle: He exhibits no swelling.       Left ankle: He exhibits no swelling.  Lymphadenopathy:    He has no cervical adenopathy.  Neurological: He is alert and oriented to person, place, and time. No cranial nerve deficit.  Skin: Skin is warm. No rash noted. Nails show no clubbing.  Psychiatric: He has a normal mood and affect.      Data Reviewed: Basic Metabolic Panel:  Recent Labs Lab 06/28/15 0837 06/29/15 0356  NA 132* 135  K 4.2 4.2  CL 94* 99*  CO2 28 27  GLUCOSE 127* 146*  BUN 21* 26*  CREATININE 1.17 0.95  CALCIUM 8.0* 8.2*   Liver Function Tests:  Recent Labs Lab 06/28/15 0837 06/29/15 0356  AST 27 27  ALT 21 19  ALKPHOS 79 69  BILITOT 0.8 0.5  PROT 7.1 6.9  ALBUMIN 3.8 3.6   CBC:  Recent Labs Lab 06/28/15 0837 06/29/15 0356  WBC 10.2 7.6  NEUTROABS 8.0*  --   HGB 13.6 12.5*  HCT 40.1 36.4*  MCV 87.0 87.3  PLT 194 179   Cardiac Enzymes:  Recent Labs Lab 06/28/15 0837  TROPONINI <0.03   CBG:  Recent Labs Lab 06/28/15 1145 06/28/15 2054 06/29/15  0804 06/29/15 1113 06/30/15 0747  GLUCAP 143* 162* 152* 138* 124*    Recent Results (from the past 240 hour(s))  Rapid Influenza A&B Antigens ([facility] only)     Status: None   Collection Time: 06/28/15  8:35 AM  Result Value Ref Range Status   Influenza A ([facility]) NEGATIVE NEGATIVE Final   Influenza B ([facility]) NEGATIVE NEGATIVE Final  Blood culture (routine x 2)     Status: None (Preliminary result)   Collection Time: 06/28/15  8:35 AM  Result Value Ref Range Status   Specimen Description BLOOD LEFT HAND  Final   Special Requests BOTTLES DRAWN AEROBIC AND ANAEROBIC  1CC  Final   Culture NO GROWTH 2 DAYS  Final   Report Status PENDING   Incomplete  Blood culture (routine x 2)     Status: None (Preliminary result)   Collection Time: 06/28/15  8:37 AM  Result Value Ref Range Status   Specimen Description BLOOD RIGHT HAND  Final   Special Requests   Final    BOTTLES DRAWN AEROBIC AND ANAEROBIC  AER 5CC ANA 3CC   Culture NO GROWTH 2 DAYS  Final   Report Status PENDING  Incomplete     Studies: Dg Chest 2 View  06/29/2015  CLINICAL DATA:  Cough.  Shortness of breath. EXAM: CHEST  2 VIEW COMPARISON:  06/28/2015.  02/01/2014 .  CT 05/26/2008. FINDINGS: Mediastinum hilar structures are normal. Heart size is stable. No pulmonary venous congestion. Persistent but partially clearing right base infiltrate. Interim near complete clearing of left perihilar infiltrate . Persistent small right pleural thickening consistent with scarring. No pneumothorax . IMPRESSION: 1. Persistent but partially clearing right base infiltrate. Interim near complete clearing of left perihilar infiltrate. 2. Right pleural thickening again noted consistent with scarring. Electronically Signed   By: [place]   On: 06/29/2015 07:43    Scheduled Meds: . amLODipine  10 mg Oral Daily  . aspirin EC  81 mg Oral Daily  . azithromycin  500 mg Intravenous Q24H  . budesonide (PULMICORT) nebulizer solution  0.25 mg Nebulization BID  . cefTRIAXone (ROCEPHIN)  IV  1 g Intravenous Q24H  . cholecalciferol  1,000 Units Oral Daily  . docusate sodium  100 mg Oral BID  . enoxaparin (LOVENOX) injection  40 mg Subcutaneous Q24H  . guaiFENesin  1,200 mg Oral BID  . ipratropium-albuterol  3 mL Nebulization QID  . latanoprost  1 drop Both Eyes QHS  . magnesium oxide  400 mg Oral Daily  . methylPREDNISolone (SOLU-MEDROL) injection  60 mg Intravenous Q6H  . pantoprazole  40 mg Oral Daily  . propranolol ER  160 mg Oral QHS  . vitamin B-12  1,000 mcg Oral Daily    Assessment/Plan:  1. Acute respiratory failure with hypoxia. The patient was tapered down to 2 L of oxygen.  Continue oxygen supplementation as long as it's warranted. Since the patient came in without oxygen and I like to send him home without oxygen. 2. COPD exacerbation with continued tobacco abuse. High-dose Solu-Medrol. Nebulizer treatments and add budesonide nebulizers. Patient must stop smoking. Refused nicotine patch. 3. Bilateral community-acquired pneumonia Rocephin and Zithromax. 4. Hypertension essential continue usual medications  5. Glaucoma unspecified continue latanoprost follow-up with eye doctor as outpatient 6. History migraine on propranolol ER for prevention  Code Status:     Code Status Orders        Start     Ordered   06/28/15 1232  Full code  Continuous     06/28/15 1231    Code Status History    Date Active Date Inactive Code Status Order ID Comments User Context   This patient has a current code status but no historical code status.    Advance Directive Documentation        Most Recent Value   Type of Advance Directive  Living will   Pre-existing out of facility DNR order (yellow form or pink MOST form)     "MOST" Form in Place?       Family Communication: Wife yesterday Disposition Plan: Home once breathing better   Antibiotics:  Rocephin  Zithromax  Time spent: 24 minutes  Loletha Grayer  [facility] [place] [facility]

## 2015-07-01 LAB — GLUCOSE, CAPILLARY: GLUCOSE-CAPILLARY: 139 mg/dL — AB (ref 65–99)

## 2015-07-01 MED ORDER — HYDROCHLOROTHIAZIDE 12.5 MG PO CAPS
12.5000 mg | ORAL_CAPSULE | Freq: Every day | ORAL | Status: DC
  Administered 2015-07-01 – 2015-07-02 (×2): 12.5 mg via ORAL
  Filled 2015-07-01 (×2): qty 1

## 2015-07-01 MED ORDER — METHYLPREDNISOLONE SODIUM SUCC 40 MG IJ SOLR
40.0000 mg | Freq: Four times a day (QID) | INTRAMUSCULAR | Status: DC
  Administered 2015-07-01 – 2015-07-02 (×3): 40 mg via INTRAVENOUS
  Filled 2015-07-01 (×3): qty 1

## 2015-07-01 MED ORDER — BISACODYL 5 MG PO TBEC
5.0000 mg | DELAYED_RELEASE_TABLET | Freq: Once | ORAL | Status: AC
  Administered 2015-07-01: 5 mg via ORAL
  Filled 2015-07-01: qty 1

## 2015-07-01 MED ORDER — IPRATROPIUM-ALBUTEROL 0.5-2.5 (3) MG/3ML IN SOLN
3.0000 mL | RESPIRATORY_TRACT | Status: DC | PRN
Start: 2015-07-01 — End: 2015-07-02
  Administered 2015-07-01 – 2015-07-02 (×3): 3 mL via RESPIRATORY_TRACT
  Filled 2015-07-01 (×3): qty 3

## 2015-07-01 MED ORDER — NYSTATIN 100000 UNIT/ML MT SUSP
5.0000 mL | Freq: Four times a day (QID) | OROMUCOSAL | Status: DC
  Administered 2015-07-01 – 2015-07-02 (×4): 500000 [IU] via ORAL
  Filled 2015-07-01 (×4): qty 5

## 2015-07-01 MED ORDER — AZITHROMYCIN 250 MG PO TABS: 500.0000 mg | ORAL_TABLET | Freq: Every day | ORAL | Status: DC

## 2015-07-01 MED ORDER — AZITHROMYCIN 250 MG PO TABS
500.0000 mg | ORAL_TABLET | Freq: Every day | ORAL | Status: AC
  Administered 2015-07-02: 500 mg via ORAL
  Filled 2015-07-01: qty 2

## 2015-07-01 NOTE — Progress Notes (Signed)
PHARMACIST - PHYSICIAN COMMUNICATION DR: [place]  CONCERNING: Antibiotic IV to Oral Route Change Policy  RECOMMENDATION: This patient is receiving azithromycin by the intravenous route.  Based on criteria approved by the Pharmacy and Therapeutics Committee, the antibiotic(s) is/are being converted to the equivalent oral dose form(s).   DESCRIPTION: These criteria include: Patient being treated for a respiratory tract infection, urinary tract infection, cellulitis or clostridium difficile associated diarrhea if on metronidazole The patient is not neutropenic and does not exhibit a GI malabsorption state The patient is eating (either orally or via tube) and/or has been taking other orally administered medications for a least 24 hours The patient is improving clinically and has a Tmax < 100.5  If you have questions about this conversion, please contact the Pharmacy Department  []  ( [phone] )  [place] [x]  ( [phone] )  [facility] []  ( [phone]  [place] []  ( [phone] )  [facility] []  ( [phone] )  [facility]   

## 2015-07-01 NOTE — Progress Notes (Signed)
Patient ID: Lucas Ramirez, male   DOB: 12-28-1955, 60 y.o.   MRN: [id number]  [facility] PROGRESS NOTE  ORRIE RAMPLEY [id number] DOB: 12-15-1955 DOA: 06/28/2015 PCP: Lelon Huh, MD  HPI/Subjective: Patient feeling better when I saw him this morning than he had been. He was able to come off the oxygen. Still with cough and shortness of breath.  Objective: Filed Vitals:   07/01/15 1523 07/01/15 1524  BP: 155/85 156/77  Pulse: 73 69  Temp: 98.2 F (36.8 C)   Resp: 20     Filed Weights   06/28/15 1613 06/30/15 0322 07/01/15 0459  Weight: 75.751 kg (167 lb) 74.163 kg (163 lb 8 oz) 68.765 kg (151 lb 9.6 oz)    ROS: Review of Systems  Constitutional: Negative for fever and chills.  Eyes: Negative for blurred vision.  Respiratory: Positive for cough, shortness of breath and wheezing.   Cardiovascular: Negative for chest pain.  Gastrointestinal: Negative for nausea, vomiting, abdominal pain, diarrhea and constipation.  Genitourinary: Negative for dysuria.  Musculoskeletal: Negative for joint pain.  Neurological: Negative for dizziness and headaches.   Exam: Physical Exam  Constitutional: He is oriented to person, place, and time.  HENT:  Nose: No mucosal edema.  Mouth/Throat: No oropharyngeal exudate or posterior oropharyngeal edema.  Eyes: Conjunctivae, EOM and lids are normal. Pupils are equal, round, and reactive to light.  Neck: No JVD present. Carotid bruit is not present. No edema present. No thyroid mass and no thyromegaly present.  Cardiovascular: S1 normal and S2 normal.  Exam reveals no gallop.   No murmur heard. Pulses:      Dorsalis pedis pulses are 2+ on the right side, and 2+ on the left side.  Respiratory: No respiratory distress. He has decreased breath sounds in the right lower field and the left lower field. He has wheezes in the right lower field and the left lower field. He has no rhonchi. He has no rales.  Audible wheeze heard without the  stethoscope but less than yesterday.  GI: Soft. Bowel sounds are normal. There is no tenderness.  Musculoskeletal:       Right ankle: He exhibits no swelling.       Left ankle: He exhibits no swelling.  Lymphadenopathy:    He has no cervical adenopathy.  Neurological: He is alert and oriented to person, place, and time. No cranial nerve deficit.  Skin: Skin is warm. No rash noted. Nails show no clubbing.  Psychiatric: He has a normal mood and affect.      Data Reviewed: Basic Metabolic Panel:  Recent Labs Lab 06/28/15 0837 06/29/15 0356  NA 132* 135  K 4.2 4.2  CL 94* 99*  CO2 28 27  GLUCOSE 127* 146*  BUN 21* 26*  CREATININE 1.17 0.95  CALCIUM 8.0* 8.2*   Liver Function Tests:  Recent Labs Lab 06/28/15 0837 06/29/15 0356  AST 27 27  ALT 21 19  ALKPHOS 79 69  BILITOT 0.8 0.5  PROT 7.1 6.9  ALBUMIN 3.8 3.6   CBC:  Recent Labs Lab 06/28/15 0837 06/29/15 0356  WBC 10.2 7.6  NEUTROABS 8.0*  --   HGB 13.6 12.5*  HCT 40.1 36.4*  MCV 87.0 87.3  PLT 194 179   Cardiac Enzymes:  Recent Labs Lab 06/28/15 0837  TROPONINI <0.03   CBG:  Recent Labs Lab 06/28/15 2054 06/29/15 0804 06/29/15 1113 06/30/15 0747 07/01/15 0737  GLUCAP 162* 152* 138* 124* 139*    Recent Results (from  the past 240 hour(s))  Rapid Influenza A&B Antigens ([facility] only)     Status: None   Collection Time: 06/28/15  8:35 AM  Result Value Ref Range Status   Influenza A ([facility]) NEGATIVE NEGATIVE Final   Influenza B ([facility]) NEGATIVE NEGATIVE Final  Blood culture (routine x 2)     Status: None (Preliminary result)   Collection Time: 06/28/15  8:35 AM  Result Value Ref Range Status   Specimen Description BLOOD LEFT HAND  Final   Special Requests BOTTLES DRAWN AEROBIC AND ANAEROBIC  1CC  Final   Culture NO GROWTH 3 DAYS  Final   Report Status PENDING  Incomplete  Blood culture (routine x 2)     Status: None (Preliminary result)   Collection Time: 06/28/15  8:37 AM  Result Value  Ref Range Status   Specimen Description BLOOD RIGHT HAND  Final   Special Requests   Final    BOTTLES DRAWN AEROBIC AND ANAEROBIC  AER 5CC ANA 3CC   Culture NO GROWTH 3 DAYS  Final   Report Status PENDING  Incomplete      Scheduled Meds: . amLODipine  10 mg Oral Daily  . aspirin EC  81 mg Oral Daily  . [START ON 07/02/2015] azithromycin  500 mg Oral Daily  . bisacodyl  5 mg Oral Once  . budesonide (PULMICORT) nebulizer solution  0.25 mg Nebulization BID  . cefTRIAXone (ROCEPHIN)  IV  1 g Intravenous Q24H  . cholecalciferol  1,000 Units Oral Daily  . docusate sodium  100 mg Oral BID  . enoxaparin (LOVENOX) injection  40 mg Subcutaneous Q24H  . guaiFENesin  1,200 mg Oral BID  . hydrochlorothiazide  12.5 mg Oral Daily  . latanoprost  1 drop Both Eyes QHS  . magnesium oxide  400 mg Oral Daily  . methylPREDNISolone (SOLU-MEDROL) injection  40 mg Intravenous Q6H  . nystatin  5 mL Oral QID  . pantoprazole  40 mg Oral Daily  . propranolol ER  160 mg Oral QHS  . vitamin B-12  1,000 mcg Oral Daily    Assessment/Plan:  1. Acute respiratory failure with hypoxia. The patient was able to come down off the oxygen today 2. COPD exacerbation with continued tobacco abuse. Decrease Solu-Medrol to 40 mg IV every 6 hours. Nebulizer treatments and continue budesonide nebulizers. Patient must stop smoking. Refused nicotine patch. 3. Bilateral community-acquired pneumonia Rocephin and Zithromax. 4. Hypertension essential continue usual medications  5. Glaucoma unspecified continue latanoprost follow-up with eye doctor as outpatient 6. History migraine on propranolol ER for prevention  Code Status:     Code Status Orders        Start     Ordered   06/28/15 1232  Full code   Continuous     06/28/15 1231    Code Status History    Date Active Date Inactive Code Status Order ID Comments User Context   This patient has a current code status but no historical code status.    Advance Directive  Documentation        Most Recent Value   Type of Advance Directive  Living will   Pre-existing out of facility DNR order (yellow form or pink MOST form)     "MOST" Form in Place?       Disposition Plan: Home potentially tomorrow  Antibiotics:  Rocephin  Zithromax  Time spent: 22 minutes  Loletha Grayer  [facility]

## 2015-07-01 NOTE — Progress Notes (Signed)
SATURATION QUALIFICATIONS: (This note is used to comply with regulatory documentation for home oxygen)  Patient Saturations on Room Air at Rest = 96%  Patient Saturations on Room Air while Ambulating = 93%  

## 2015-07-01 NOTE — Care Management (Addendum)
Met with patient who is asking to "get up and walk". MD notified. He states his wife bought a nebulizer but is not aware of having Rx for machine use. He is no longer requiring O2.  PCP Dr. Caryn Section. No RNCM needs.

## 2015-07-02 ENCOUNTER — Telehealth: Payer: Self-pay | Admitting: Family Medicine

## 2015-07-02 LAB — GLUCOSE, CAPILLARY: Glucose-Capillary: 144 mg/dL — ABNORMAL HIGH (ref 65–99)

## 2015-07-02 MED ORDER — ALBUTEROL SULFATE (2.5 MG/3ML) 0.083% IN NEBU: 2.5000 mg | INHALATION_SOLUTION | Freq: Four times a day (QID) | RESPIRATORY_TRACT | Status: DC | PRN

## 2015-07-02 MED ORDER — PREDNISONE 5 MG PO TABS: ORAL_TABLET | ORAL | Status: DC

## 2015-07-02 MED ORDER — FLUTICASONE PROPIONATE HFA 220 MCG/ACT IN AERO: 1.0000 | INHALATION_SPRAY | Freq: Two times a day (BID) | RESPIRATORY_TRACT | Status: DC

## 2015-07-02 MED ORDER — NYSTATIN 100000 UNIT/ML MT SUSP: 5.0000 mL | Freq: Four times a day (QID) | OROMUCOSAL | Status: DC

## 2015-07-02 MED ORDER — PREDNISONE 20 MG PO TABS: 40.0000 mg | ORAL_TABLET | Freq: Every day | ORAL | Status: DC

## 2015-07-02 MED ORDER — CEFUROXIME AXETIL 500 MG PO TABS: 500.0000 mg | ORAL_TABLET | Freq: Two times a day (BID) | ORAL | Status: DC

## 2015-07-02 MED ORDER — ALBUTEROL SULFATE HFA 108 (90 BASE) MCG/ACT IN AERS: 2.0000 | INHALATION_SPRAY | Freq: Four times a day (QID) | RESPIRATORY_TRACT | Status: DC | PRN

## 2015-07-02 MED ORDER — TIOTROPIUM BROMIDE MONOHYDRATE 18 MCG IN CAPS: 18.0000 ug | ORAL_CAPSULE | Freq: Every day | RESPIRATORY_TRACT | Status: DC

## 2015-07-02 MED ORDER — TIOTROPIUM BROMIDE MONOHYDRATE 18 MCG IN CAPS
18.0000 ug | ORAL_CAPSULE | Freq: Every day | RESPIRATORY_TRACT | Status: DC
  Filled 2015-07-02: qty 5

## 2015-07-02 NOTE — Discharge Summary (Signed)
[facility] NAME: Lucas Ramirez    MR#:  [id number]  DATE OF BIRTH:  Sep 07, 1964  DATE OF ADMISSION:  06/28/2015 ADMITTING PHYSICIAN: Idelle Crouch, MD  DATE OF DISCHARGE: 07/02/2015 11:16 AM  PRIMARY CARE PHYSICIAN: Lelon Huh, MD    ADMISSION DIAGNOSIS:  SOB (shortness of breath) [R06.02] Community acquired pneumonia [J18.9] CAP (community acquired pneumonia) [J18.9]  DISCHARGE DIAGNOSIS:  Principal Problem:   Acute respiratory failure ([place]) Active Problems:   CAP (community acquired pneumonia)   COPD exacerbation ([place])   HTN (hypertension)   SECONDARY DIAGNOSIS:   Past Medical History  Diagnosis Date  . Migraine   . Hypertension   . COPD (chronic obstructive pulmonary disease) ([place])     HOSPITAL COURSE:   1. Acute respiratory failure with hypoxia. Patient required oxygen during most of the hospital course, aged 60 He was able to come off oxygen the day before discharge home. 2. COPD exacerbation and multi lobar pneumonia. Patient was started on IV Solu-Medrol and antibiotics. Upon discharge home patient still had some expiratory wheeze but was moving better air. The audible wheeze was heard without a stethoscope had improved. The patient was moving better air and wanted to go home. Patient was prescribed nebulizer of albuterol and albuterol inhaler. Patient prescribed Flovent and Spiriva. Patient was given a prednisone taper. Patient finished course of Zithromax while in the hospital. Ceftin prescribed upon discharge. 3. Tobacco abuse smoking cessation counseling done 3 minutes by me. 4. Essential hypertension blood pressure was elevated during the hospital course. Patient was on Norvasc and propranolol. Hopefully as his breathing improves and the steroids are tapered his blood pressure will improve. Recommend checking this as outpatient. 5. Chronic migraine on propranolol 6. Gastroesophageal reflux disease on  omeprazole   DISCHARGE CONDITIONS:   Satisfactory  CONSULTS OBTAINED:  None  DRUG ALLERGIES:   Allergies  Allergen Reactions  . Bupropion Hcl     seizures    DISCHARGE MEDICATIONS:   Discharge Medication List as of 07/02/2015 10:32 AM    START taking these medications   Details  albuterol (PROVENTIL HFA;VENTOLIN HFA) 108 (90 Base) MCG/ACT inhaler Inhale 2 puffs into the lungs every 6 (six) hours as needed for wheezing or shortness of breath., Starting 07/02/2015, Until Discontinued, Print    albuterol (PROVENTIL) (2.5 MG/3ML) 0.083% nebulizer solution Take 3 mLs (2.5 mg total) by nebulization every 6 (six) hours as needed for wheezing or shortness of breath., Starting 07/02/2015, Until Discontinued, Print    cefUROXime (CEFTIN) 500 MG tablet Take 1 tablet (500 mg total) by mouth 2 (two) times daily with a meal., Starting 07/02/2015, Until Discontinued, Print    fluticasone (FLOVENT HFA) 220 MCG/ACT inhaler Inhale 1 puff into the lungs 2 (two) times daily., Starting 07/02/2015, Until Discontinued, Print    nystatin (MYCOSTATIN) 100000 UNIT/ML suspension Take 5 mLs (500,000 Units total) by mouth 4 (four) times daily., Starting 07/02/2015, Until Discontinued, Print    predniSONE (DELTASONE) 5 MG tablet 8 tabs po day1; 6 tabs po day2; 4 tabs po day3; 3 tabs day4; 2 tabs day5, 1 tab day6 then stop, Print    tiotropium (SPIRIVA) 18 MCG inhalation capsule Place 1 capsule (18 mcg total) into inhaler and inhale daily., Starting 07/02/2015, Until Discontinued, Print      CONTINUE these medications which have NOT CHANGED   Details  amLODipine (NORVASC) 10 MG tablet Take 1 tablet (10 mg total) by mouth daily., Starting 02/10/2015, Until Discontinued, Normal  cholecalciferol (VITAMIN D) 1000 UNITS tablet Take 1,000 Units by mouth daily., Until Discontinued, Historical Med    Cyanocobalamin (RA VITAMIN B-12 TR) 1000 MCG TBCR Take 1,000 mcg by mouth daily., Until Discontinued, Historical Med     latanoprost (XALATAN) 0.005 % ophthalmic solution Place 1 drop into both eyes at bedtime. , Until Discontinued, Historical Med    Magnesium 500 MG CAPS Take 1 capsule by mouth daily. , Until Discontinued, Historical Med    omeprazole (PRILOSEC) 20 MG capsule Take 20 mg by mouth daily., Until Discontinued, Historical Med    propranolol ER (INDERAL LA) 160 MG SR capsule Take 1 capsule (160 mg total) by mouth daily., Starting 04/01/2015, Until Discontinued, Normal    SUMAtriptan (IMITREX) 100 MG tablet Take 1 tablet (100 mg total) by mouth every 2 (two) hours as needed. May repeat in 2 hours. No more than 2 in a day, Starting 05/20/2015, Until Discontinued, Normal    oxyCODONE-acetaminophen (PERCOCET/ROXICET) 5-325 MG tablet Take 1-2 tablets by mouth every 6 (six) hours as needed for severe pain., Starting 05/13/2015, Until Discontinued, Print         DISCHARGE INSTRUCTIONS:   Follow-up PMD one week  If you experience worsening of your admission symptoms, develop shortness of breath, life threatening emergency, suicidal or homicidal thoughts you must seek medical attention immediately by calling 911 or calling your MD immediately  if symptoms less severe.  You Must read complete instructions/literature along with all the possible adverse reactions/side effects for all the Medicines you take and that have been prescribed to you. Take any new Medicines after you have completely understood and accept all the possible adverse reactions/side effects.   Please note  You were cared for by a hospitalist during your hospital stay. If you have any questions about your discharge medications or the care you received while you were in the hospital after you are discharged, you can call the unit and asked to speak with the hospitalist on call if the hospitalist that took care of you is not available. Once you are discharged, your primary care physician will handle any further medical issues. Please note that  NO REFILLS for any discharge medications will be authorized once you are discharged, as it is imperative that you return to your primary care physician (or establish a relationship with a primary care physician if you do not have one) for your aftercare needs so that they can reassess your need for medications and monitor your lab values.    Today   CHIEF COMPLAINT:   Chief Complaint  Patient presents with  . Altered Mental Status  . Shortness of Breath    HISTORY OF PRESENT ILLNESS:  Lucas Ramirez  is a 60 y.o. male presented with altered mental status shortness of breath was found to be in acute respiratory failure with COPD exacerbation and pneumonia.  VITAL SIGNS:  Blood pressure 160/82, pulse 60, temperature 97.8 F (36.6 C), temperature source Oral, resp. rate 18, height 5\' 9"  (1.753 m), weight 68.221 kg (150 lb 6.4 oz), SpO2 96 %.    PHYSICAL EXAMINATION:  GENERAL:  60 y.o.-year-old patient lying in the bed with no acute distress.  EYES: Pupils equal, round, reactive to light and accommodation. No scleral icterus. Extraocular muscles intact.  HEENT: Head atraumatic, normocephalic. Oropharynx and nasopharynx clear.  NECK:  Supple, no jugular venous distention. No thyroid enlargement, no tenderness.  LUNGS: Decreased breath sounds bilaterally, slight expiratory wheezing at lung bases, no rales,rhonchi or crepitation. No use  of accessory muscles of respiration.  CARDIOVASCULAR: S1, S2 normal. No murmurs, rubs, or gallops.  ABDOMEN: Soft, non-tender, non-distended. Bowel sounds present. No organomegaly or mass.  EXTREMITIES: No pedal edema, cyanosis, or clubbing.  NEUROLOGIC: Cranial nerves II through XII are intact. Muscle strength 5/5 in all extremities. Sensation intact. Gait not checked.  PSYCHIATRIC: The patient is alert and oriented x 3.  SKIN: No obvious rash, lesion, or ulcer.   DATA REVIEW:   CBC  Recent Labs Lab 06/29/15 0356  WBC 7.6  HGB 12.5*  HCT 36.4*   PLT 179    Chemistries   Recent Labs Lab 06/29/15 0356  NA 135  K 4.2  CL 99*  CO2 27  GLUCOSE 146*  BUN 26*  CREATININE 0.95  CALCIUM 8.2*  AST 27  ALT 19  ALKPHOS 69  BILITOT 0.5    Cardiac Enzymes  Recent Labs Lab 06/28/15 0837  TROPONINI <0.03    Microbiology Results  Results for orders placed or performed during the hospital encounter of 06/28/15  Rapid Influenza A&B Antigens ([place] only)     Status: None   Collection Time: 06/28/15  8:35 AM  Result Value Ref Range Status   Influenza A ([facility]) NEGATIVE NEGATIVE Final   Influenza B ([facility]) NEGATIVE NEGATIVE Final  Blood culture (routine x 2)     Status: None (Preliminary result)   Collection Time: 06/28/15  8:35 AM  Result Value Ref Range Status   Specimen Description BLOOD LEFT HAND  Final   Special Requests BOTTLES DRAWN AEROBIC AND ANAEROBIC  1CC  Final   Culture NO GROWTH 4 DAYS  Final   Report Status PENDING  Incomplete  Blood culture (routine x 2)     Status: None (Preliminary result)   Collection Time: 06/28/15  8:37 AM  Result Value Ref Range Status   Specimen Description BLOOD RIGHT HAND  Final   Special Requests   Final    BOTTLES DRAWN AEROBIC AND ANAEROBIC  AER 5CC ANA 3CC   Culture NO GROWTH 4 DAYS  Final   Report Status PENDING  Incomplete    Management plans discussed with the patient, family and they are in agreement.  CODE STATUS:  Code Status History    Date Active Date Inactive Code Status Order ID Comments User Context   06/28/2015 12:31 PM 07/02/2015  2:16 PM Full Code [id number]  Idelle Crouch, MD Inpatient    Advance Directive Documentation        Most Recent Value   Type of Advance Directive  Living will   Pre-existing out of facility DNR order (yellow form or pink MOST form)     "MOST" Form in Place?        TOTAL TIME TAKING CARE OF THIS PATIENT: 35 minutes.    Loletha Grayer M.D on 07/02/2015 at 5:27 PM  Between 7am to 6pm - Pager - [phone]  After 6pm go to  [email or web address] - password [email or web address] [facility]  Office  [phone]  CC: Primary care physician; Lelon Huh, MD

## 2015-07-02 NOTE — Discharge Instructions (Signed)

## 2015-07-02 NOTE — Telephone Encounter (Signed)
Pt is being discharged from [facility] today for shortness of breath.  I have schedule a hospital follow up appt/MW

## 2015-07-03 LAB — CULTURE, BLOOD (ROUTINE X 2)
Culture: NO GROWTH
Culture: NO GROWTH

## 2015-07-10 ENCOUNTER — Encounter: Payer: Self-pay | Admitting: Family Medicine

## 2015-07-10 ENCOUNTER — Ambulatory Visit (INDEPENDENT_AMBULATORY_CARE_PROVIDER_SITE_OTHER): Payer: BLUE CROSS/BLUE SHIELD | Admitting: Family Medicine

## 2015-07-10 VITALS — BP 140/70 | HR 67 | Temp 98.3°F | Resp 18 | Ht 66.0 in | Wt 168.0 lb

## 2015-07-10 DIAGNOSIS — J189 Pneumonia, unspecified organism: Secondary | ICD-10-CM | POA: Diagnosis not present

## 2015-07-10 DIAGNOSIS — G43001 Migraine without aura, not intractable, with status migrainosus: Secondary | ICD-10-CM | POA: Diagnosis not present

## 2015-07-10 MED ORDER — OXYCODONE-ACETAMINOPHEN 5-325 MG PO TABS: 1.0000 | ORAL_TABLET | Freq: Four times a day (QID) | ORAL | Status: DC | PRN

## 2015-07-10 NOTE — Progress Notes (Signed)
Patient: Lucas Ramirez Male    DOB: 1956-02-06   60 y.o.   MRN: [id number] Visit Date: 07/10/2015  Today's Provider: Lelon Huh, MD   Chief Complaint  Patient presents with  . Hospitalization Follow-up   Subjective:    HPI    Follow up Hospitalization  Patient was admitted to [facility] on 06/28/2015 and  discharged on 07/02/2015.  He was treated for SOB (shortness of breath)  Community acquired pneumonia . Treatment for this included; Patient required oxygen for COPD during most of the hospital course. He was able to come off oxygen the day before discharge home. Patient was started on IV Solu-Medrol and antibiotics. Patient was prescribed nebulizer of albuterol and albuterol inhaler. Patient prescribed Flovent and Spiriva. Patient was given a prednisone taper. Patient finished course of Zithromax while in the hospital. Ceftin prescribed upon discharge. He reports good compliance with treatment. He reports this condition is Improved.  ----------------------------------------------------------------------    Follow up migraines He states migraines have been much less frequent. He still takes occasional oxycodone which works will. He states he needs refill today.   Allergies  Allergen Reactions  . Bupropion Hcl     seizures   Previous Medications   ALBUTEROL (PROVENTIL HFA;VENTOLIN HFA) 108 (90 BASE) MCG/ACT INHALER    Inhale 2 puffs into the lungs every 6 (six) hours as needed for wheezing or shortness of breath.   ALBUTEROL (PROVENTIL) (2.5 MG/3ML) 0.083% NEBULIZER SOLUTION    Take 3 mLs (2.5 mg total) by nebulization every 6 (six) hours as needed for wheezing or shortness of breath.   AMLODIPINE (NORVASC) 10 MG TABLET    Take 1 tablet (10 mg total) by mouth daily.   CEFUROXIME (CEFTIN) 500 MG TABLET    Take 1 tablet (500 mg total) by mouth 2 (two) times daily with a meal.   CHOLECALCIFEROL (VITAMIN D) 1000 UNITS TABLET    Take 1,000 Units by mouth daily.   CYANOCOBALAMIN  (RA VITAMIN B-12 TR) 1000 MCG TBCR    Take 1,000 mcg by mouth daily.   FLUTICASONE (FLOVENT HFA) 220 MCG/ACT INHALER    Inhale 1 puff into the lungs 2 (two) times daily.   LATANOPROST (XALATAN) 0.005 % OPHTHALMIC SOLUTION    Place 1 drop into both eyes at bedtime.    MAGNESIUM 500 MG CAPS    Take 1 capsule by mouth daily.    NYSTATIN (MYCOSTATIN) 100000 UNIT/ML SUSPENSION    Take 5 mLs (500,000 Units total) by mouth 4 (four) times daily.   OMEPRAZOLE (PRILOSEC) 20 MG CAPSULE    Take 20 mg by mouth daily.   OXYCODONE-ACETAMINOPHEN (PERCOCET/ROXICET) 5-325 MG TABLET    Take 1-2 tablets by mouth every 6 (six) hours as needed for severe pain.   PREDNISONE (DELTASONE) 5 MG TABLET    8 tabs po day1; 6 tabs po day2; 4 tabs po day3; 3 tabs day4; 2 tabs day5, 1 tab day6 then stop   PROPRANOLOL ER (INDERAL LA) 160 MG SR CAPSULE    Take 1 capsule (160 mg total) by mouth daily.   SUMATRIPTAN (IMITREX) 100 MG TABLET    Take 1 tablet (100 mg total) by mouth every 2 (two) hours as needed. May repeat in 2 hours. No more than 2 in a day   TIOTROPIUM (SPIRIVA) 18 MCG INHALATION CAPSULE    Place 1 capsule (18 mcg total) into inhaler and inhale daily.    Review of Systems  Constitutional: Positive for  fatigue. Negative for fever, chills and appetite change.  Respiratory: Negative for chest tightness, shortness of breath and wheezing.   Cardiovascular: Negative for chest pain and palpitations.  Gastrointestinal: Negative for nausea, vomiting and abdominal pain.    Social History  Substance Use Topics  . Smoking status: Current Every Day Smoker -- 1.00 packs/day for 35 years  . Smokeless tobacco: Never Used  . Alcohol Use: No   Objective:   BP 140/70 mmHg  Pulse 67  Temp(Src) 98.3 F (36.8 C) (Oral)  Resp 18  Ht 5\' 6"  (1.676 m)  Wt 168 lb (76.204 kg)  BMI 27.13 kg/m2  SpO2 98%  Physical Exam   General Appearance:    Alert, cooperative, no distress  Eyes:    PERRL, conjunctiva/corneas clear, EOM's  intact       Lungs:     Clear to auscultation bilaterally, respirations unlabored  Heart:    Regular rate and rhythm  Neurologic:   Awake, alert, oriented x 3. No apparent focal neurological           defect.           Assessment & Plan:     1. Pneumonia, unspecified laterality, unspecified part of lung Clinically resolved. CXR to ensure radiological clearing - DG Chest 2 View; Future  2. Migraine without aura and with status migrainosus, not intractable Fairly well controlled. Needs refill of pain medications.  - oxyCODONE-acetaminophen (PERCOCET/ROXICET) 5-325 MG tablet; Take 1-2 tablets by mouth every 6 (six) hours as needed for severe pain.  Dispense: 30 tablet; Refill: 0       Lelon Huh, MD  [facility]

## 2015-07-10 NOTE — Patient Instructions (Addendum)
Please stop smoking  Go to the [facility] on [facility] for Chest Xray

## 2015-08-28 DIAGNOSIS — G43709 Chronic migraine without aura, not intractable, without status migrainosus: Secondary | ICD-10-CM | POA: Diagnosis not present

## 2015-08-28 DIAGNOSIS — G629 Polyneuropathy, unspecified: Secondary | ICD-10-CM | POA: Diagnosis not present

## 2015-08-28 DIAGNOSIS — R202 Paresthesia of skin: Secondary | ICD-10-CM | POA: Diagnosis not present

## 2015-10-15 ENCOUNTER — Encounter: Payer: Self-pay | Admitting: Family Medicine

## 2015-10-15 ENCOUNTER — Ambulatory Visit (INDEPENDENT_AMBULATORY_CARE_PROVIDER_SITE_OTHER): Payer: BLUE CROSS/BLUE SHIELD | Admitting: Family Medicine

## 2015-10-15 VITALS — BP 140/80 | HR 59 | Temp 97.8°F | Resp 16 | Ht 66.0 in | Wt 167.0 lb

## 2015-10-15 DIAGNOSIS — G43009 Migraine without aura, not intractable, without status migrainosus: Secondary | ICD-10-CM | POA: Diagnosis not present

## 2015-10-15 MED ORDER — KETOROLAC TROMETHAMINE 60 MG/2ML IM SOLN
60.0000 mg | Freq: Once | INTRAMUSCULAR | Status: AC
  Administered 2015-10-15: 60 mg via INTRAMUSCULAR

## 2015-10-15 MED ORDER — CLONAZEPAM 1 MG PO TABS: ORAL_TABLET | ORAL | Status: DC

## 2015-10-15 NOTE — Progress Notes (Signed)
Patient: Lucas Ramirez Male    DOB: 1955-09-24   60 y.o.   MRN: [id number] Visit Date: 10/15/2015  Today's Provider: Lelon Huh, MD   Chief Complaint  Patient presents with  . Migraine   Subjective:    Migraine  This is a chronic problem. The current episode started more than 1 year ago. The problem occurs intermittently. The problem has been unchanged. The pain is located in the frontal and vertex region. The pain does not radiate. The pain quality is similar to prior headaches. The quality of the pain is described as dull. Associated symptoms include neck pain, photophobia and a visual change. Pertinent negatives include no abdominal pain, abnormal behavior, anorexia, back pain, blurred vision, coughing, dizziness, drainage, eye pain, eye redness, eye watering, facial sweating, fever, hearing loss, insomnia, loss of balance, muscle aches, nausea, numbness, phonophobia, rhinorrhea, scalp tenderness, seizures, sinus pressure, sore throat, swollen glands, tingling, tinnitus, vomiting, weakness or weight loss. Nothing aggravates the symptoms. Treatments tried: imitrex. The treatment provided moderate relief.    Patient has had recurrent headachesfor over 20 years. Has been having headaches about once a week for several months. Headache he has now started yesterday. Pain is mostly in forehead. Moderate in severity. Headaches greatly improved after taking 2 Imitrex. Yesterday and a klonipin, but flared back up today. Has taken Imitrex today but still has headache. He feels like his headaches are aggravated by stress, and that taking klonipin seems more effective then oxycodone.  Allergies  Allergen Reactions  . Bupropion Hcl     seizures   Current Meds  Medication Sig  . amLODipine (NORVASC) 10 MG tablet Take 1 tablet (10 mg total) by mouth daily.  . cholecalciferol (VITAMIN D) 1000 UNITS tablet Take 1,000 Units by mouth daily.  . Cyanocobalamin (RA VITAMIN B-12 TR) 1000 MCG TBCR  Take 1,000 mcg by mouth daily.  . fluticasone (FLOVENT HFA) 220 MCG/ACT inhaler Inhale 1 puff into the lungs 2 (two) times daily.  . Magnesium 500 MG CAPS Take 1 capsule by mouth daily.   Marland Kitchen nystatin (MYCOSTATIN) 100000 UNIT/ML suspension Take 5 mLs (500,000 Units total) by mouth 4 (four) times daily.  Marland Kitchen omeprazole (PRILOSEC) 20 MG capsule Take 20 mg by mouth daily.  Marland Kitchen oxyCODONE-acetaminophen (PERCOCET/ROXICET) 5-325 MG tablet Take 1-2 tablets by mouth every 6 (six) hours as needed for severe pain.  Marland Kitchen propranolol ER (INDERAL LA) 160 MG SR capsule Take 1 capsule (160 mg total) by mouth daily.  . SUMAtriptan (IMITREX) 100 MG tablet Take 1 tablet (100 mg total) by mouth every 2 (two) hours as needed. May repeat in 2 hours. No more than 2 in a day    Review of Systems  Constitutional: Negative for fever, chills, weight loss and appetite change.  HENT: Negative for hearing loss, rhinorrhea, sinus pressure, sore throat and tinnitus.   Eyes: Positive for photophobia and visual disturbance. Negative for blurred vision, pain and redness.  Respiratory: Negative for cough, chest tightness, shortness of breath and wheezing.   Cardiovascular: Negative for chest pain and palpitations.  Gastrointestinal: Negative for nausea, vomiting, abdominal pain and anorexia.  Musculoskeletal: Positive for neck pain. Negative for back pain.  Neurological: Positive for headaches. Negative for dizziness, tingling, seizures, weakness, numbness and loss of balance.  Psychiatric/Behavioral: The patient does not have insomnia.     Social History  Substance Use Topics  . Smoking status: Current Every Day Smoker -- 1.00 packs/day for 35 years  .  Smokeless tobacco: Never Used  . Alcohol Use: No   Objective:   BP 140/80 mmHg  Pulse 59  Temp(Src) 97.8 F (36.6 C) (Oral)  Resp 16  Ht 5\' 6"  (1.676 m)  Wt 167 lb (75.751 kg)  BMI 26.97 kg/m2  SpO2 100%  Physical Exam   General Appearance:    Alert, cooperative, no  distress  Eyes:    PERRL, conjunctiva/corneas clear, EOM's intact       Lungs:     Clear to auscultation bilaterally, respirations unlabored  Heart:    Regular rate and rhythm  Neurologic:   Awake, alert, oriented x 3. No apparent focal neurological           defect.           Assessment & Plan:     1. Migraine without aura and without status migrainosus, not intractable Seem to be aggravated by stress. Will change from prn oxycodone/apap to prn klonopin. If continues to have headaches weekly will consider changing to hight daily dose of Inderal.  - clonazePAM (KLONOPIN) 1 MG tablet; 1/2 to 1 tablet every six hours as needed for headache  Dispense: 30 tablet; Refill: 1 - ketorolac (TORADOL) injection 60 mg; Inject 2 mLs (60 mg total) into the muscle once.       Lelon Huh, MD  [facility]

## 2015-11-10 ENCOUNTER — Other Ambulatory Visit: Payer: Self-pay | Admitting: Family Medicine

## 2015-11-10 DIAGNOSIS — G43009 Migraine without aura, not intractable, without status migrainosus: Secondary | ICD-10-CM

## 2015-11-12 ENCOUNTER — Other Ambulatory Visit: Payer: Self-pay | Admitting: *Deleted

## 2015-11-12 DIAGNOSIS — G43009 Migraine without aura, not intractable, without status migrainosus: Secondary | ICD-10-CM

## 2015-11-12 MED ORDER — CLONAZEPAM 1 MG PO TABS: ORAL_TABLET | ORAL | Status: DC

## 2015-11-12 NOTE — Telephone Encounter (Signed)
Please call in clonazepam  

## 2015-11-13 NOTE — Telephone Encounter (Signed)
Rx called in to pharmacy. 

## 2015-12-02 DIAGNOSIS — L281 Prurigo nodularis: Secondary | ICD-10-CM | POA: Diagnosis not present

## 2015-12-02 DIAGNOSIS — Z85828 Personal history of other malignant neoplasm of skin: Secondary | ICD-10-CM | POA: Diagnosis not present

## 2016-01-09 ENCOUNTER — Other Ambulatory Visit: Payer: Self-pay | Admitting: Family Medicine

## 2016-01-09 DIAGNOSIS — G43009 Migraine without aura, not intractable, without status migrainosus: Secondary | ICD-10-CM

## 2016-01-11 ENCOUNTER — Ambulatory Visit (INDEPENDENT_AMBULATORY_CARE_PROVIDER_SITE_OTHER): Payer: BLUE CROSS/BLUE SHIELD | Admitting: Family Medicine

## 2016-01-11 ENCOUNTER — Encounter: Payer: Self-pay | Admitting: Family Medicine

## 2016-01-11 VITALS — BP 180/94 | HR 52 | Temp 98.4°F | Resp 16

## 2016-01-11 DIAGNOSIS — G43009 Migraine without aura, not intractable, without status migrainosus: Secondary | ICD-10-CM | POA: Diagnosis not present

## 2016-01-11 MED ORDER — OXYCODONE-ACETAMINOPHEN 7.5-325 MG PO TABS: 1.0000 | ORAL_TABLET | ORAL | 0 refills | Status: DC | PRN

## 2016-01-11 MED ORDER — KETOROLAC TROMETHAMINE 60 MG/2ML IM SOLN
60.0000 mg | Freq: Once | INTRAMUSCULAR | Status: AC
  Administered 2016-01-11: 60 mg via INTRAMUSCULAR

## 2016-01-11 NOTE — Progress Notes (Signed)
Patient: Lucas Ramirez Male    DOB: 11-30-1955   60 y.o.   MRN: [id number] Visit Date: 01/11/2016  Today's Provider: Lelon Huh, MD   Chief Complaint  Patient presents with  . Migraine   Subjective:    Patient has had migraine every day since Saturday 01/09/16. Patient stated that migraines have been going away with Imitrex but he is out of medication. Patient woke up this morning with another migriane.   He states that current migraine is identical to migraines in the past which usually respond to Imitrex. It goes across both sides of his forehead. Marland KitchenNo visual disturbance or any other neurological symptoms. No confusion or any other mental states changes. No nausea. He does have mild photophobia.     Allergies  Allergen Reactions  . Bupropion Hcl     seizures     Current Outpatient Prescriptions:  .  albuterol (PROVENTIL HFA;VENTOLIN HFA) 108 (90 Base) MCG/ACT inhaler, Inhale 2 puffs into the lungs every 6 (six) hours as needed for wheezing or shortness of breath. (Patient not taking: Reported on 10/15/2015), Disp: 1 Inhaler, Rfl: 0 .  albuterol (PROVENTIL) (2.5 MG/3ML) 0.083% nebulizer solution, Take 3 mLs (2.5 mg total) by nebulization every 6 (six) hours as needed for wheezing or shortness of breath. (Patient not taking: Reported on 10/15/2015), Disp: 75 mL, Rfl: 0 .  amLODipine (NORVASC) 10 MG tablet, Take 1 tablet (10 mg total) by mouth daily., Disp: 30 tablet, Rfl: 12 .  cholecalciferol (VITAMIN D) 1000 UNITS tablet, Take 1,000 Units by mouth daily., Disp: , Rfl:  .  clonazePAM (KLONOPIN) 1 MG tablet, 1/2 to 1 tablet every six hours as needed for headache, Disp: 30 tablet, Rfl: 1 .  Cyanocobalamin (RA VITAMIN B-12 TR) 1000 MCG TBCR, Take 1,000 mcg by mouth daily., Disp: , Rfl:  .  fluticasone (FLOVENT HFA) 220 MCG/ACT inhaler, Inhale 1 puff into the lungs 2 (two) times daily., Disp: 1 Inhaler, Rfl: 0 .  latanoprost (XALATAN) 0.005 % ophthalmic solution, Place 1 drop  into both eyes at bedtime. Reported on 10/15/2015, Disp: , Rfl:  .  Magnesium 500 MG CAPS, Take 1 capsule by mouth daily. , Disp: , Rfl:  .  nystatin (MYCOSTATIN) 100000 UNIT/ML suspension, Take 5 mLs (500,000 Units total) by mouth 4 (four) times daily., Disp: 60 mL, Rfl: 0 .  omeprazole (PRILOSEC) 20 MG capsule, Take 20 mg by mouth daily., Disp: , Rfl:  .  propranolol ER (INDERAL LA) 160 MG SR capsule, Take 1 capsule (160 mg total) by mouth daily., Disp: 30 capsule, Rfl: 12 .  SUMAtriptan (IMITREX) 100 MG tablet, TAKE 1 TABLET BY MOUTH AT ONSET OF HEADACHE. MAY TAKE AN ADDITIONAL TABLET IN 2 HOURS IF NEEDED MAX 200 MG PER DAY, Disp: 9 tablet, Rfl: 5 .  tiotropium (SPIRIVA) 18 MCG inhalation capsule, Place 1 capsule (18 mcg total) into inhaler and inhale daily. (Patient not taking: Reported on 10/15/2015), Disp: 30 capsule, Rfl: 0  Review of Systems  Constitutional: Negative for appetite change and chills.  Respiratory: Negative for chest tightness, shortness of breath and wheezing.   Cardiovascular: Negative for chest pain and palpitations.    Social History  Substance Use Topics  . Smoking status: Current Every Day Smoker    Packs/day: 1.00    Years: 35.00  . Smokeless tobacco: Never Used  . Alcohol use No   Objective:   BP (!) 180/94 (BP Location: Right Arm, Patient Position: Sitting, Cuff  Size: Normal)   Pulse (!) 52   Temp 98.4 F (36.9 C) (Oral)   Resp 16   SpO2 100%   Physical Exam   General Appearance:    Alert, cooperative, no distress  Eyes:    PERRL, conjunctiva/corneas clear, EOM's intact       Lungs:     Clear to auscultation bilaterally, respirations unlabored  Heart:    Regular rate and rhythm  Neurologic:   Awake, alert, oriented x 3. No apparent focal neurological           defect.           Assessment & Plan:     1. Migraine without aura and without status migrainosus, not intractable Advised not to take Imitrex so long as his blood pressure remains  elevated. Given ketorolac in the office and rx Percocet to take prn at home. Call if headache not resolved within 24 hours. Go to Er if h develops any fever or neurological symptoms, or if current symptoms worsen.  - ketorolac (TORADOL) injection 60 mg; Inject 2 mLs (60 mg total) into the muscle once.       Lelon Huh, MD  [facility]

## 2016-01-12 ENCOUNTER — Other Ambulatory Visit: Payer: Self-pay | Admitting: *Deleted

## 2016-01-12 DIAGNOSIS — G43009 Migraine without aura, not intractable, without status migrainosus: Secondary | ICD-10-CM

## 2016-01-12 MED ORDER — CLONAZEPAM 1 MG PO TABS: ORAL_TABLET | ORAL | 2 refills | Status: DC

## 2016-01-12 NOTE — Telephone Encounter (Signed)
Please call in clonazepam  

## 2016-01-12 NOTE — Telephone Encounter (Signed)
Rx called in to pharmacy. 

## 2016-01-25 ENCOUNTER — Ambulatory Visit (INDEPENDENT_AMBULATORY_CARE_PROVIDER_SITE_OTHER): Payer: BLUE CROSS/BLUE SHIELD | Admitting: Family Medicine

## 2016-01-25 ENCOUNTER — Encounter: Payer: Self-pay | Admitting: Family Medicine

## 2016-01-25 ENCOUNTER — Telehealth: Payer: Self-pay | Admitting: Family Medicine

## 2016-01-25 VITALS — BP 124/68 | HR 59 | Temp 97.9°F | Resp 14 | Wt 160.0 lb

## 2016-01-25 DIAGNOSIS — R7303 Prediabetes: Secondary | ICD-10-CM

## 2016-01-25 DIAGNOSIS — R197 Diarrhea, unspecified: Secondary | ICD-10-CM | POA: Diagnosis not present

## 2016-01-25 MED ORDER — OMEPRAZOLE 20 MG PO CPDR: 20.0000 mg | DELAYED_RELEASE_CAPSULE | Freq: Every day | ORAL | 5 refills | Status: DC

## 2016-01-25 NOTE — Progress Notes (Signed)
Patient: Lucas Ramirez Male    DOB: 1955-08-07   60 y.o.   MRN: [id number] Visit Date: 01/25/2016  Today's Provider: Vernie Murders, PA   Chief Complaint  Patient presents with  . Diarrhea   Subjective:    Diarrhea   This is a new problem. Episode onset: Friday. The problem occurs 5 to 10 times per day. The problem has been gradually improving. The stool consistency is described as watery. The patient states that diarrhea does not awaken him from sleep. Nothing aggravates the symptoms. He has tried anti-motility drug for the symptoms. The treatment provided moderate relief.   Past Medical History:  Diagnosis Date  . COPD (chronic obstructive pulmonary disease) ([place])   . Hypertension   . Migraine    Past Surgical History:  Procedure Laterality Date  . [place]   correcting Scoliosils per pt  . SKIN LESION EXCISION  06/06/2013   facial left cheek. Dr. Evorn Gong  . UPPER GI ENDOSCOPY  07/11/2008   [facility]. Dr. Donnella Sham. Impression. -LA Grade C reflux esophagitis. 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 Down syndrome Sister   . CAD Neg Hx   . Heart disease Neg Hx   . Colon cancer Neg Hx   . Prostate cancer Neg Hx    Allergies  Allergen Reactions  . Bupropion Hcl     seizures     Previous Medications   ALBUTEROL (PROVENTIL HFA;VENTOLIN HFA) 108 (90 BASE) MCG/ACT INHALER    Inhale 2 puffs into the lungs every 6 (six) hours as needed for wheezing or shortness of breath.   ALBUTEROL (PROVENTIL) (2.5 MG/3ML) 0.083% NEBULIZER SOLUTION    Take 3 mLs (2.5 mg total) by nebulization every 6 (six) hours as needed for wheezing or shortness of breath.   AMLODIPINE (NORVASC) 10 MG TABLET    Take 1 tablet (10 mg total) by mouth daily.   CHOLECALCIFEROL (VITAMIN D) 1000 UNITS TABLET    Take 1,000 Units by mouth daily.   CLONAZEPAM (KLONOPIN) 1 MG TABLET    1/2 to 1 tablet every six hours as needed for  headache   CYANOCOBALAMIN (RA VITAMIN B-12 TR) 1000 MCG TBCR    Take 1,000 mcg by mouth daily.   FLUTICASONE (FLOVENT HFA) 220 MCG/ACT INHALER    Inhale 1 puff into the lungs 2 (two) times daily.   LATANOPROST (XALATAN) 0.005 % OPHTHALMIC SOLUTION    Place 1 drop into both eyes at bedtime. Reported on 10/15/2015   MAGNESIUM 500 MG CAPS    Take 1 capsule by mouth daily.    NYSTATIN (MYCOSTATIN) 100000 UNIT/ML SUSPENSION    Take 5 mLs (500,000 Units total) by mouth 4 (four) times daily.   OMEPRAZOLE (PRILOSEC) 20 MG CAPSULE    Take 20 mg by mouth daily.   OXYCODONE-ACETAMINOPHEN (PERCOCET) 7.5-325 MG TABLET    Take 1 tablet by mouth every 4 (four) hours as needed for severe pain.   PROPRANOLOL ER (INDERAL LA) 160 MG SR CAPSULE    Take 1 capsule (160 mg total) by mouth daily.   SUMATRIPTAN (IMITREX) 100 MG TABLET    TAKE 1 TABLET BY MOUTH AT ONSET OF HEADACHE. MAY TAKE AN ADDITIONAL TABLET IN 2 HOURS IF NEEDED MAX 200 MG PER DAY   TIOTROPIUM (SPIRIVA) 18 MCG INHALATION CAPSULE    Place 1 capsule (18 mcg total) into inhaler and inhale daily.    Review of Systems  Constitutional: Positive for appetite change.  Respiratory: Negative.   Cardiovascular: Negative.   Gastrointestinal: Positive for diarrhea.    Social History  Substance Use Topics  . Smoking status: Current Every Day Smoker    Packs/day: 1.00    Years: 35.00  . Smokeless tobacco: Never Used  . Alcohol use No   Objective:   BP 124/68 (BP Location: Right Arm, Patient Position: Sitting, Cuff Size: Normal)   Pulse (!) 59   Temp 97.9 F (36.6 C) (Oral)   Resp 14   Wt 160 lb (72.6 kg)   BMI 25.82 kg/m  Wt Readings from Last 3 Encounters:  01/25/16 160 lb (72.6 kg)  10/15/15 167 lb (75.8 kg)  07/10/15 168 lb (76.2 kg)   Physical Exam  Constitutional: He is oriented to person, place, and time. He appears well-developed and well-nourished. No distress.  HENT:  Head: Normocephalic and atraumatic.  Right Ear: Hearing normal.    Left Ear: Hearing normal.  Nose: Nose normal.  Eyes: Conjunctivae and lids are normal. Right eye exhibits no discharge. Left eye exhibits no discharge. No scleral icterus.  Cardiovascular: Normal rate and regular rhythm.   Pulmonary/Chest: Effort normal and breath sounds normal. No respiratory distress.  Abdominal: Soft.  Slightly hyperactive bowel sounds. No tenderness.  Musculoskeletal: He exhibits deformity.  Spinal surgeries as a child due to scoliosis.  Neurological: He is alert and oriented to person, place, and time.  Skin: Skin is intact. No lesion and no rash noted.  Psychiatric: He has a normal mood and affect. His speech is normal and behavior is normal. Thought content normal.      Assessment & Plan:     1. Diarrhea, unspecified type Onset 01-20-16 without blood in stools, fever or vomiting. History of esophagitis and erosive gastritis per colonoscopy by Dr. Donnella Sham. Has been advised to stop the Omeprazole per gastroenterology office at [facility]. May use Imodium-AD and increase fluid intake. Check CBC and CMP to rule out dehydration or infection. Recheck prn. - CBC with Differential/Platelet - Comprehensive metabolic panel

## 2016-01-25 NOTE — Telephone Encounter (Signed)
Pt stated that he was put on omeprazole (PRILOSEC) 20 MG capsule by GI when he had his endoscopy. Pt stated that they had written the RX a few times over the years but when he asked for a refill today he was advised to contact his PCP. Pt is requesting that Dr. Caryn Section take over this medication. [facility]. Please advise. Thanks TNP

## 2016-01-26 ENCOUNTER — Telehealth: Payer: Self-pay

## 2016-01-26 DIAGNOSIS — G43709 Chronic migraine without aura, not intractable, without status migrainosus: Secondary | ICD-10-CM | POA: Diagnosis not present

## 2016-01-26 LAB — CBC WITH DIFFERENTIAL/PLATELET
BASOS ABS: 0 10*3/uL (ref 0.0–0.2)
Basos: 0 %
EOS (ABSOLUTE): 0.1 10*3/uL (ref 0.0–0.4)
Eos: 0 %
Hematocrit: 44.9 % (ref 37.5–51.0)
Hemoglobin: 15.3 g/dL (ref 12.6–17.7)
Immature Grans (Abs): 0 10*3/uL (ref 0.0–0.1)
Immature Granulocytes: 0 %
LYMPHS ABS: 2.2 10*3/uL (ref 0.7–3.1)
Lymphs: 12 %
MCH: 29.8 pg (ref 26.6–33.0)
MCHC: 34.1 g/dL (ref 31.5–35.7)
MCV: 87 fL (ref 79–97)
MONOS ABS: 1.8 10*3/uL — AB (ref 0.1–0.9)
Monocytes: 10 %
Neutrophils Absolute: 13.8 10*3/uL — ABNORMAL HIGH (ref 1.4–7.0)
Neutrophils: 78 %
Platelets: 312 10*3/uL (ref 150–379)
RBC: 5.14 x10E6/uL (ref 4.14–5.80)
RDW: 14.9 % (ref 12.3–15.4)
WBC: 17.9 10*3/uL — AB (ref 3.4–10.8)

## 2016-01-26 LAB — COMPREHENSIVE METABOLIC PANEL
ALK PHOS: 89 IU/L (ref 39–117)
ALT: 9 IU/L (ref 0–44)
AST: 12 IU/L (ref 0–40)
Albumin/Globulin Ratio: 2.3 — ABNORMAL HIGH (ref 1.2–2.2)
Albumin: 4.6 g/dL (ref 3.6–4.8)
BILIRUBIN TOTAL: 0.5 mg/dL (ref 0.0–1.2)
BUN/Creatinine Ratio: 13 (ref 10–24)
BUN: 18 mg/dL (ref 8–27)
CHLORIDE: 101 mmol/L (ref 96–106)
CO2: 22 mmol/L (ref 18–29)
CREATININE: 1.36 mg/dL — AB (ref 0.76–1.27)
Calcium: 9.1 mg/dL (ref 8.6–10.2)
GFR calc Af Amer: 65 mL/min/{1.73_m2} (ref >59)
GFR calc non Af Amer: 56 mL/min/{1.73_m2} — ABNORMAL LOW (ref >59)
GLOBULIN, TOTAL: 2 g/dL (ref 1.5–4.5)
GLUCOSE: 97 mg/dL (ref 65–99)
Potassium: 5.1 mmol/L (ref 3.5–5.2)
SODIUM: 138 mmol/L (ref 134–144)
Total Protein: 6.6 g/dL (ref 6.0–8.5)

## 2016-01-26 MED ORDER — CIPROFLOXACIN HCL 500 MG PO TABS: 500.0000 mg | ORAL_TABLET | Freq: Two times a day (BID) | ORAL | 0 refills | Status: DC

## 2016-01-26 NOTE — Telephone Encounter (Signed)
Patient advised. Patient verbalized understanding. RX sent to pharmacy.

## 2016-01-26 NOTE — Telephone Encounter (Signed)
-----   Message from Margo Common, [place] sent at 01/26/2016  8:14 AM EDT ----- WBC count high. Usually seen with bacterial infection. Recommend Cipro 500 mg BID #14 and recheck in 2-3 days. May have some dehydration with creatinine elevation. Drink extra fluids. If diarrhea continues, will need to get stool cultures.

## 2016-02-02 DIAGNOSIS — G43719 Chronic migraine without aura, intractable, without status migrainosus: Secondary | ICD-10-CM | POA: Diagnosis not present

## 2016-02-09 DIAGNOSIS — G43719 Chronic migraine without aura, intractable, without status migrainosus: Secondary | ICD-10-CM | POA: Diagnosis not present

## 2016-02-12 ENCOUNTER — Other Ambulatory Visit: Payer: Self-pay | Admitting: Family Medicine

## 2016-02-29 ENCOUNTER — Encounter: Payer: Self-pay | Admitting: Physician Assistant

## 2016-02-29 ENCOUNTER — Ambulatory Visit (INDEPENDENT_AMBULATORY_CARE_PROVIDER_SITE_OTHER): Payer: BLUE CROSS/BLUE SHIELD | Admitting: Physician Assistant

## 2016-02-29 VITALS — BP 180/86 | HR 72 | Temp 97.6°F | Resp 16 | Wt 159.0 lb

## 2016-02-29 DIAGNOSIS — G43009 Migraine without aura, not intractable, without status migrainosus: Secondary | ICD-10-CM

## 2016-02-29 MED ORDER — KETOROLAC TROMETHAMINE 60 MG/2ML IM SOLN: 60.0000 mg | Freq: Once | INTRAMUSCULAR | Status: AC

## 2016-02-29 MED ORDER — OXYCODONE-ACETAMINOPHEN 7.5-325 MG PO TABS: 1.0000 | ORAL_TABLET | ORAL | 0 refills | Status: DC | PRN

## 2016-02-29 NOTE — Progress Notes (Signed)
Patient: Lucas Ramirez Male    DOB: 04/12/1956   60 y.o.   MRN: [id number] Visit Date: 02/29/2016  Today's Provider: Trinna Post, PA-C   Chief Complaint  Patient presents with  . Migraine    Started this morning when he woke up.   Subjective:    Migraine   This is a recurrent problem. The current episode started today. The problem occurs constantly. The problem has been unchanged. The pain is located in the bilateral and frontal region. The pain quality is similar to prior headaches. The quality of the pain is described as aching and throbbing. The pain is at a severity of 8/10. Associated symptoms include back pain (Back pain when he has a migraine. ). Pertinent negatives include no dizziness, neck pain, numbness, seizures or weakness. His past medical history is significant for migraine headaches.   Patient is a 60 year old male with history of migraines and Bell's palsy taking sumatriptan and percocet as abortive tx presenting today with his usual migraine. He reports this is not the worst headache of his life. No nausea, vomiting, photophobia, phonophobia. Has been seen in clinic for the same multiple times and is followed by Dr. Manuella Ghazi in neurology and receives sphenopalatine ganglion blocks which help for about two weeks. 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 No weakness, disorientation.      Allergies  Allergen Reactions  . Bupropion Hcl     seizures     Current Outpatient Prescriptions:  .  albuterol (PROVENTIL HFA;VENTOLIN HFA) 108 (90 Base) MCG/ACT inhaler, Inhale 2 puffs into the lungs every 6 (six) hours as needed for wheezing or shortness of breath., Disp: 1 Inhaler, Rfl: 0 .  albuterol (PROVENTIL) (2.5 MG/3ML) 0.083% nebulizer solution, Take 3 mLs (2.5 mg total) by nebulization every 6 (six) hours as needed for wheezing or shortness of breath., Disp: 75 mL, Rfl:  0 .  amLODipine (NORVASC) 10 MG tablet, TAKE 1 TABLET BY MOUTH ONCE DAILY., Disp: 30 tablet, Rfl: 12 .  cholecalciferol (VITAMIN D) 1000 UNITS tablet, Take 1,000 Units by mouth daily., Disp: , Rfl:  .  Cyanocobalamin (RA VITAMIN B-12 TR) 1000 MCG TBCR, Take 1,000 mcg by mouth daily., Disp: , Rfl:  .  fluticasone (FLOVENT HFA) 220 MCG/ACT inhaler, Inhale 1 puff into the lungs 2 (two) times daily., Disp: 1 Inhaler, Rfl: 0 .  latanoprost (XALATAN) 0.005 % ophthalmic solution, Place 1 drop into both eyes at bedtime. Reported on 10/15/2015, Disp: , Rfl:  .  Magnesium 500 MG CAPS, Take 1 capsule by mouth daily. , Disp: , Rfl:  .  omeprazole (PRILOSEC) 20 MG capsule, Take 1 capsule (20 mg total) by mouth daily., Disp: 30 capsule, Rfl: 5 .  propranolol ER (INDERAL LA) 160 MG SR capsule, Take 1 capsule (160 mg total) by mouth daily., Disp: 30 capsule, Rfl: 12 .  SUMAtriptan (IMITREX) 100 MG tablet, TAKE 1 TABLET BY MOUTH AT ONSET OF HEADACHE. MAY TAKE AN ADDITIONAL TABLET IN 2 HOURS IF NEEDED MAX 200 MG PER DAY, Disp: 9 tablet, Rfl: 5 .  tiotropium (SPIRIVA) 18 MCG inhalation capsule, Place 1 capsule (18 mcg total) into inhaler and inhale daily., Disp: 30 capsule, Rfl: 0 .  clonazePAM (KLONOPIN) 1 MG tablet, 1/2 to 1 tablet every six hours as needed for headache (Patient not taking: Reported on 02/29/2016), Disp: 30  tablet, Rfl: 2 .  oxyCODONE-acetaminophen (PERCOCET) 7.5-325 MG tablet, Take 1 tablet by mouth every 4 (four) hours as needed for severe pain., Disp: 20 tablet, Rfl: 0  Current Facility-Administered Medications:  .  ketorolac (TORADOL) injection 60 mg, 60 mg, Intramuscular, Once, Trinna Post, PA-C  Review of Systems  Constitutional: Negative.   Respiratory: Negative.   Cardiovascular: Negative.   Gastrointestinal: Negative.   Musculoskeletal: Positive for back pain (Back pain when he has a migraine. ). Negative for arthralgias, gait problem, joint swelling, myalgias, neck pain and neck  stiffness.  Neurological: Positive for headaches. Negative for dizziness, tremors, seizures, syncope, speech difficulty, weakness, light-headedness and numbness.    Social History  Substance Use Topics  . Smoking status: Current Every Day Smoker    Packs/day: 1.00    Years: 35.00  . Smokeless tobacco: Never Used  . Alcohol use No   Objective:   BP (!) 180/86 (BP Location: Left Arm, Patient Position: Sitting, Cuff Size: Normal)   Pulse 72   Temp 97.6 F (36.4 C) (Oral)   Resp 16   Wt 159 lb (72.1 kg)   BMI 25.66 kg/m   Physical Exam  Constitutional: He is oriented to person, place, and time. He appears well-developed and well-nourished.  HENT:  Head: Normocephalic.  Eyes: Pupils are equal, round, and reactive to light.  Neck: Normal range of motion.  Musculoskeletal: He exhibits no edema.  Neurological: He is alert and oriented to person, place, and time. A cranial nerve deficit is present. No sensory deficit. GCS eye subscore is 4. GCS verbal subscore is 5. GCS motor subscore is 6.  Patient has facial asymmetry with weakness on his left side. Left sided downturned mouth upon smiling, impaired ability to puff cheek or raise eyebrow. This is not new, according to patient. Tongue protrudes midline, sensation in tact. Bilateral extremity strength equal and normal.   Skin: Skin is warm and dry.  Psychiatric: He has a normal mood and affect. His behavior is normal.        Assessment & Plan:      Problem List Items Addressed This Visit    None    Visit Diagnoses    Migraine without aura and without status migrainosus, not intractable    -  Primary   Relevant Medications   oxyCODONE-acetaminophen (PERCOCET) 7.5-325 MG tablet   ketorolac (TORADOL) injection 60 mg (Start on 02/29/2016 12:00 PM)     Patient is 60 y/o presenting with his usual migraines. Does have residual Bell's palsy weakness. Given Toradol 60 mg IM in office with marked improvement today. Patient requesting  refill of Percocet. Dr. Caryn Section signed for this and advised patient to come back in and see Dr. Caryn Section if migraines significantly worsen for reassessment.   Return if symptoms worsen or fail to improve.  The entirety of the information documented in the History of Present Illness, Review of Systems and Physical Exam were personally obtained by me. Portions of this information were initially documented by Ashley Royalty, CMA and reviewed by me for thoroughness and accuracy.    Patient Instructions  Migraine Headache A migraine headache is an intense, throbbing pain on one or both sides of your head. A migraine can last for 30 minutes to several hours. CAUSES  The exact cause of a migraine headache is not always known. However, a migraine may be caused when nerves in the brain become irritated and release chemicals that cause inflammation. This causes pain. Certain things may also  trigger migraines, such as:  Alcohol.  Smoking.  Stress.  Menstruation.  Aged cheeses.  Foods or drinks that contain nitrates, glutamate, aspartame, or tyramine.  Lack of sleep.  Chocolate.  Caffeine.  Hunger.  Physical exertion.  Fatigue.  Medicines used to treat chest pain (nitroglycerine), birth control pills, estrogen, and some blood pressure medicines. SIGNS AND SYMPTOMS  Pain on one or both sides of your head.  Pulsating or throbbing pain.  Severe pain that prevents daily activities.  Pain that is aggravated by any physical activity.  Nausea, vomiting, or both.  Dizziness.  Pain with exposure to bright lights, loud noises, or activity.  General sensitivity to bright lights, loud noises, or smells. Before you get a migraine, you may get warning signs that a migraine is coming (aura). An aura may include:  Seeing flashing lights.  Seeing bright spots, halos, or zigzag lines.  Having tunnel vision or blurred vision.  Having feelings of numbness or tingling.  Having trouble  talking.  Having muscle weakness. DIAGNOSIS  A migraine headache is often diagnosed based on:  Symptoms.  Physical exam.  A CT scan or MRI of your head. These imaging tests cannot diagnose migraines, but they can help rule out other causes of headaches. TREATMENT Medicines may be given for pain and nausea. Medicines can also be given to help prevent recurrent migraines.  HOME CARE INSTRUCTIONS  Only take over-the-counter or prescription medicines for pain or discomfort as directed by your health care provider. The use of long-term narcotics is not recommended.  Lie down in a dark, quiet room when you have a migraine.  Keep a journal to find out what may trigger your migraine headaches. For example, write down:  What you eat and drink.  How much sleep you get.  Any change to your diet or medicines.  Limit alcohol consumption.  Quit smoking if you smoke.  Get 7-9 hours of sleep, or as recommended by your health care provider.  Limit stress.  Keep lights dim if bright lights bother you and make your migraines worse. SEEK IMMEDIATE MEDICAL CARE IF:   Your migraine becomes severe.  You have a fever.  You have a stiff neck.  You have vision loss.  You have muscular weakness or loss of muscle control.  You start losing your balance or have trouble walking.  You feel faint or pass out.  You have severe symptoms that are different from your first symptoms. MAKE SURE YOU:   Understand these instructions.  Will watch your condition.  Will get help right away if you are not doing well or get worse.   This information is not intended to replace advice given to you by your health care provider. Make sure you discuss any questions you have with your health care provider.   Document Released: 04/11/2005 Document Revised: 05/02/2014 Document Reviewed: 12/17/2012 Elsevier Interactive Patient Education 2016 [facility]

## 2016-02-29 NOTE — Patient Instructions (Signed)

## 2016-03-23 DIAGNOSIS — G43719 Chronic migraine without aura, intractable, without status migrainosus: Secondary | ICD-10-CM | POA: Diagnosis not present

## 2016-04-01 DIAGNOSIS — G43719 Chronic migraine without aura, intractable, without status migrainosus: Secondary | ICD-10-CM | POA: Diagnosis not present

## 2016-04-05 ENCOUNTER — Other Ambulatory Visit: Payer: Self-pay | Admitting: Family Medicine

## 2016-04-05 DIAGNOSIS — G43009 Migraine without aura, not intractable, without status migrainosus: Secondary | ICD-10-CM

## 2016-04-05 DIAGNOSIS — H401130 Primary open-angle glaucoma, bilateral, stage unspecified: Secondary | ICD-10-CM | POA: Diagnosis not present

## 2016-04-28 DIAGNOSIS — H401130 Primary open-angle glaucoma, bilateral, stage unspecified: Secondary | ICD-10-CM | POA: Diagnosis not present

## 2016-05-18 ENCOUNTER — Encounter: Payer: Self-pay | Admitting: Family Medicine

## 2016-05-18 ENCOUNTER — Ambulatory Visit (INDEPENDENT_AMBULATORY_CARE_PROVIDER_SITE_OTHER): Payer: BLUE CROSS/BLUE SHIELD | Admitting: Family Medicine

## 2016-05-18 VITALS — BP 132/60 | HR 67 | Temp 97.9°F | Resp 16 | Wt 159.0 lb

## 2016-05-18 DIAGNOSIS — G43009 Migraine without aura, not intractable, without status migrainosus: Secondary | ICD-10-CM | POA: Diagnosis not present

## 2016-05-18 MED ORDER — TOPIRAMATE 25 MG PO TABS: ORAL_TABLET | ORAL | 0 refills | Status: DC

## 2016-05-18 MED ORDER — OXYCODONE-ACETAMINOPHEN 7.5-325 MG PO TABS: 1.0000 | ORAL_TABLET | ORAL | 0 refills | Status: DC | PRN

## 2016-05-18 NOTE — Progress Notes (Signed)
Patient: Lucas Ramirez Male    DOB: [birth year]   61 y.o.   MRN: [id number] Visit Date: 05/18/2016  Today's Provider: Lelon Huh, MD   Chief Complaint  Patient presents with  . Migraine   Subjective:    HPI Follow up of Migraine headache:  Patient was last seen for this problem 2 months ago by Marton Redwood PA. During that visit, patient was given Toradol 60mg  IM injection in the office. Percocet was also refilled and patient was advised to follow up if Migraines significantly worsened. Today patient comes in wanting to discuss preventative medication for migraines. He reports his migraines occur twice a week. Usually improves with Imitrex, but occasionally has to take percocet too.   Had been followed by Dr. Manuella Ghazi over the last few years States he had SPG injection which didn't work as well some other prescriptions which didn't help. He had taken combination Topamax, Depakote, and fluoxetine for several years during which he had no significant headaches. He has been on propranolol the last few years which significant improvement, but still not ideally controled. He would like to go back on topiramate.     Allergies  Allergen Reactions  . Bupropion Hcl     seizures     Current Outpatient Prescriptions:  .  amLODipine (NORVASC) 10 MG tablet, TAKE 1 TABLET BY MOUTH ONCE DAILY., Disp: 30 tablet, Rfl: 12 .  cholecalciferol (VITAMIN D) 1000 UNITS tablet, Take 1,000 Units by mouth daily., Disp: , Rfl:  .  clonazePAM (KLONOPIN) 1 MG tablet, 1/2 to 1 tablet every six hours as needed for headache, Disp: 30 tablet, Rfl: 2 .  Cyanocobalamin (RA VITAMIN B-12 TR) 1000 MCG TBCR, Take 1,000 mcg by mouth daily., Disp: , Rfl:  .  latanoprost (XALATAN) 0.005 % ophthalmic solution, Place 1 drop into both eyes at bedtime. Reported on 10/15/2015, Disp: , Rfl:  .  Magnesium 500 MG CAPS, Take 1 capsule by mouth daily. , Disp: , Rfl:  .  omeprazole (PRILOSEC) 20 MG capsule, Take 1 capsule (20 mg  total) by mouth daily., Disp: 30 capsule, Rfl: 5 .  oxyCODONE-acetaminophen (PERCOCET) 7.5-325 MG tablet, Take 1 tablet by mouth every 4 (four) hours as needed for severe pain., Disp: 20 tablet, Rfl: 0 .  propranolol ER (INDERAL LA) 160 MG SR capsule, TAKE 1 CAPSULE BY MOUTH ONCE DAILY, Disp: 30 each, Rfl: 5 .  SUMAtriptan (IMITREX) 100 MG tablet, TAKE 1 TABLET BY MOUTH AT ONSET OF HEADACHE. MAY TAKE AN ADDITIONAL TABLET IN 2 HOURS IF NEEDED MAX 200 MG PER DAY, Disp: 9 tablet, Rfl: 5  Review of Systems  Constitutional: Negative for appetite change, chills and fever.  Respiratory: Negative for chest tightness, shortness of breath and wheezing.   Cardiovascular: Negative for chest pain and palpitations.  Gastrointestinal: Negative for abdominal pain, nausea and vomiting.  Endocrine: Negative for cold intolerance, heat intolerance, polydipsia, polyphagia and polyuria.  Neurological: Positive for headaches.    Social History  Substance Use Topics  . Smoking status: Current Every Day Smoker    Packs/day: 1.00    Years: 35.00  . Smokeless tobacco: Never Used  . Alcohol use No   Objective:   BP 132/60 (BP Location: Left Arm, Patient Position: Sitting, Cuff Size: Normal)   Pulse 67   Temp 97.9 F (36.6 C) (Oral)   Resp 16   Wt 159 lb (72.1 kg)   SpO2 95% Comment: room air  BMI 25.66 kg/m  Physical Exam   General Appearance:    Alert, cooperative, no distress  Eyes:    PERRL, conjunctiva/corneas clear, EOM's intact       Lungs:     Clear to auscultation bilaterally, respirations unlabored  Heart:    Regular rate and rhythm  Neurologic:   Awake, alert, oriented x 3. No apparent focal neurological           defect.           Assessment & Plan:     1. Migraine without aura and without status migrainosus, not intractable Continue propranolol. Add topiramate. Follow up 3 weeks.   - topiramate (TOPAMAX) 25 MG tablet; One tablet at bedtime for one week, then increase to two at  bedtime for one week, then three at bedtime for one week.  Dispense: 75 tablet; Refill: 0 - oxyCODONE-acetaminophen (PERCOCET) 7.5-325 MG tablet; Take 1 tablet by mouth every 4 (four) hours as needed for severe pain.  Dispense: 20 tablet; Refill: 0       Lelon Huh, MD  [facility]

## 2016-05-20 DIAGNOSIS — H401131 Primary open-angle glaucoma, bilateral, mild stage: Secondary | ICD-10-CM | POA: Diagnosis not present

## 2016-06-03 DIAGNOSIS — Z85828 Personal history of other malignant neoplasm of skin: Secondary | ICD-10-CM | POA: Diagnosis not present

## 2016-06-08 ENCOUNTER — Ambulatory Visit: Payer: BLUE CROSS/BLUE SHIELD | Admitting: Family Medicine

## 2016-06-13 ENCOUNTER — Ambulatory Visit (INDEPENDENT_AMBULATORY_CARE_PROVIDER_SITE_OTHER): Payer: BLUE CROSS/BLUE SHIELD | Admitting: Family Medicine

## 2016-06-13 ENCOUNTER — Encounter: Payer: Self-pay | Admitting: Family Medicine

## 2016-06-13 DIAGNOSIS — G43009 Migraine without aura, not intractable, without status migrainosus: Secondary | ICD-10-CM | POA: Diagnosis not present

## 2016-06-13 MED ORDER — TOPIRAMATE 50 MG PO TABS: 50.0000 mg | ORAL_TABLET | Freq: Every day | ORAL | 5 refills | Status: DC

## 2016-06-13 MED ORDER — TOPIRAMATE 50 MG PO TABS: ORAL_TABLET | ORAL | 5 refills | Status: DC

## 2016-06-13 NOTE — Progress Notes (Signed)
Patient: Lucas Ramirez Male    DOB: 12/22/1955   61 y.o.   MRN: [id number] Visit Date: 06/13/2016  Today's Provider: Lelon Huh, MD   Chief Complaint  Patient presents with  . Migraine    3 week follow up   Subjective:    HPI Follow up of Migraine Headaches:  Patient was last seen for this problem 3 weeks ago. Changes made during that visit includes adding Topiramate. Patient reports good compliance with treatment, good tolerance and good symptom control. Patient states he has not had any headaches almost 3 weeks. He has a few auras and took Imitrex without developing headache. State his appetite has diminished.   Wt Readings from Last 3 Encounters:  06/13/16 155 lb (70.3 kg)  05/18/16 159 lb (72.1 kg)  02/29/16 159 lb (72.1 kg)       Allergies  Allergen Reactions  . Bupropion Hcl     seizures     Current Outpatient Prescriptions:  .  amLODipine (NORVASC) 10 MG tablet, TAKE 1 TABLET BY MOUTH ONCE DAILY., Disp: 30 tablet, Rfl: 12 .  cholecalciferol (VITAMIN D) 1000 UNITS tablet, Take 1,000 Units by mouth daily., Disp: , Rfl:  .  clonazePAM (KLONOPIN) 1 MG tablet, 1/2 to 1 tablet every six hours as needed for headache, Disp: 30 tablet, Rfl: 2 .  Cyanocobalamin (RA VITAMIN B-12 TR) 1000 MCG TBCR, Take 1,000 mcg by mouth daily., Disp: , Rfl:  .  latanoprost (XALATAN) 0.005 % ophthalmic solution, Place 1 drop into both eyes at bedtime. Reported on 10/15/2015, Disp: , Rfl:  .  Magnesium 500 MG CAPS, Take 1 capsule by mouth daily. , Disp: , Rfl:  .  omeprazole (PRILOSEC) 20 MG capsule, Take 1 capsule (20 mg total) by mouth daily., Disp: 30 capsule, Rfl: 5 .  oxyCODONE-acetaminophen (PERCOCET) 7.5-325 MG tablet, Take 1 tablet by mouth every 4 (four) hours as needed for severe pain., Disp: 20 tablet, Rfl: 0 .  propranolol ER (INDERAL LA) 160 MG SR capsule, TAKE 1 CAPSULE BY MOUTH ONCE DAILY, Disp: 30 each, Rfl: 5 .  SUMAtriptan (IMITREX) 100 MG tablet, TAKE 1 TABLET BY  MOUTH AT ONSET OF HEADACHE. MAY TAKE AN ADDITIONAL TABLET IN 2 HOURS IF NEEDED MAX 200 MG PER DAY, Disp: 9 tablet, Rfl: 5 .  topiramate (TOPAMAX) 25 MG tablet, One tablet at bedtime for one week, then increase to two at bedtime for one week, then three at bedtime for one week., Disp: 75 tablet, Rfl: 0  Review of Systems  Constitutional: Negative for appetite change, chills and fever.  Respiratory: Negative for chest tightness, shortness of breath and wheezing.   Cardiovascular: Negative for chest pain and palpitations.  Gastrointestinal: Negative for abdominal pain, nausea and vomiting.  Neurological: Negative for headaches.    Social History  Substance Use Topics  . Smoking status: Current Every Day Smoker    Packs/day: 0.50    Years: 35.00  . Smokeless tobacco: Never Used  . Alcohol use No   Objective:   BP 110/60 (BP Location: Left Arm, Patient Position: Sitting, Cuff Size: Normal)   Pulse 70   Temp 99 F (37.2 C) (Oral)   Resp 16   Wt 155 lb (70.3 kg)   SpO2 97% Comment: room air  BMI 25.02 kg/m   Physical Exam General appearance: alert, well developed, well nourished, cooperative and in no distress Head: Normocephalic, without obvious abnormality, atraumatic Respiratory: Respirations even and unlabored, normal respiratory rate  Extremities: No gross deformities Skin: Skin color, texture, turgor normal. No rashes seen  Psych: Appropriate mood and affect. Neurologic: Mental status: Alert, oriented to person, place, and time, thought content appropriate.      Assessment & Plan:     1. Migraine without aura and without status migrainosus, not intractable Much better with addition of topiramate.He states headaches resolved when he titrated to 50. We cut ack to 50 a day. Call if headaches return in which case we will increase to BID.  - topiramate (TOPAMAX) 50 MG tablet; One tablet at bedtime.  Dispense: 30 tablet; Refill: [place], MD  [facility]

## 2016-06-16 ENCOUNTER — Other Ambulatory Visit: Payer: Self-pay | Admitting: Family Medicine

## 2016-06-16 DIAGNOSIS — G43009 Migraine without aura, not intractable, without status migrainosus: Secondary | ICD-10-CM

## 2016-06-16 MED ORDER — TOPIRAMATE 50 MG PO TABS: 50.0000 mg | ORAL_TABLET | Freq: Every day | ORAL | 5 refills | Status: DC

## 2016-07-16 ENCOUNTER — Other Ambulatory Visit: Payer: Self-pay | Admitting: Family Medicine

## 2016-07-16 DIAGNOSIS — G43009 Migraine without aura, not intractable, without status migrainosus: Secondary | ICD-10-CM

## 2016-08-02 ENCOUNTER — Other Ambulatory Visit: Payer: Self-pay | Admitting: Family Medicine

## 2016-08-02 DIAGNOSIS — G43009 Migraine without aura, not intractable, without status migrainosus: Secondary | ICD-10-CM

## 2016-08-02 MED ORDER — OXYCODONE-ACETAMINOPHEN 7.5-325 MG PO TABS: 1.0000 | ORAL_TABLET | ORAL | 0 refills | Status: DC | PRN

## 2016-08-02 NOTE — Telephone Encounter (Signed)
Pt contacted office for refill request on the following medications:  oxyCODONE-acetaminophen (PERCOCET) 7.5-325 MG tablet.  XA#[phone]

## 2016-08-11 ENCOUNTER — Ambulatory Visit (INDEPENDENT_AMBULATORY_CARE_PROVIDER_SITE_OTHER): Payer: BLUE CROSS/BLUE SHIELD | Admitting: Family Medicine

## 2016-08-11 ENCOUNTER — Encounter: Payer: Self-pay | Admitting: Family Medicine

## 2016-08-11 DIAGNOSIS — G43009 Migraine without aura, not intractable, without status migrainosus: Secondary | ICD-10-CM | POA: Diagnosis not present

## 2016-08-11 MED ORDER — TOPIRAMATE 50 MG PO TABS: 50.0000 mg | ORAL_TABLET | Freq: Two times a day (BID) | ORAL | 1 refills | Status: DC

## 2016-08-11 MED ORDER — OXYCODONE-ACETAMINOPHEN 7.5-325 MG PO TABS: 1.0000 | ORAL_TABLET | ORAL | 0 refills | Status: DC | PRN

## 2016-08-11 NOTE — Progress Notes (Signed)
Patient: Lucas Ramirez Male    DOB: [birth year]   61 y.o.   MRN: [id number] Visit Date: 08/11/2016  Today's Provider: Lelon Huh, MD   Chief Complaint  Patient presents with  . Headache   Subjective:    HPI Patient comes in today c/o headache. Patient was seen in the office on 06/13/2016 with similar symptoms and reports that his headaches had been improving. At that time we changed dose form 3 x 75mg  to 1 x 50mg  topiramate daily to simplify regiment.   He reports that his last headache was on Saturday (about 5 days ago). Started having milder headaches over the last 2 months, but one last week was much more intense and lasted about 3 days and required hydrocodone/apap prescription to abort headache. Once it resolved he has had no more headaches.     Allergies  Allergen Reactions  . Bupropion Hcl     seizures     Current Outpatient Prescriptions:  .  amLODipine (NORVASC) 10 MG tablet, TAKE 1 TABLET BY MOUTH ONCE DAILY., Disp: 30 tablet, Rfl: 12 .  cholecalciferol (VITAMIN D) 1000 UNITS tablet, Take 1,000 Units by mouth daily., Disp: , Rfl:  .  clonazePAM (KLONOPIN) 1 MG tablet, 1/2 to 1 tablet every six hours as needed for headache, Disp: 30 tablet, Rfl: 2 .  Cyanocobalamin (RA VITAMIN B-12 TR) 1000 MCG TBCR, Take 1,000 mcg by mouth daily., Disp: , Rfl:  .  latanoprost (XALATAN) 0.005 % ophthalmic solution, Place 1 drop into both eyes at bedtime. Reported on 10/15/2015, Disp: , Rfl:  .  Magnesium 500 MG CAPS, Take 1 capsule by mouth daily. , Disp: , Rfl:  .  omeprazole (PRILOSEC) 20 MG capsule, TAKE 1 CAPSULE BY MOUTH ONCE DAILY, Disp: 30 capsule, Rfl: 5 .  oxyCODONE-acetaminophen (PERCOCET) 7.5-325 MG tablet, Take 1 tablet by mouth every 4 (four) hours as needed for severe pain., Disp: 20 tablet, Rfl: 0 .  propranolol ER (INDERAL LA) 160 MG SR capsule, TAKE 1 CAPSULE BY MOUTH ONCE DAILY, Disp: 30 each, Rfl: 5 .  SUMAtriptan (IMITREX) 100 MG tablet, TAKE 1 TABLET BY MOUTH  AT ONSET OF HEADACHE AND MAY TAKE AN ADDITIONAL TABLET IN 2 HOURS IF NEEDED UP TO 2 TABLETS PER 24 HOURS, Disp: 9 tablet, Rfl: 5 .  topiramate (TOPAMAX) 50 MG tablet, Take 1 tablet (50 mg total) by mouth daily., Disp: 30 tablet, Rfl: 5  Review of Systems  Constitutional: Negative.   Respiratory: Negative.   Cardiovascular: Negative.   Musculoskeletal: Negative.   Neurological: Positive for headaches.  Psychiatric/Behavioral: Negative.     Social History  Substance Use Topics  . Smoking status: Current Every Day Smoker    Packs/day: 0.50    Years: 35.00  . Smokeless tobacco: Never Used  . Alcohol use No   Objective:   BP 132/60 (BP Location: Left Arm, Patient Position: Sitting, Cuff Size: Normal)   Pulse (!) 55   Temp 99 F (37.2 C)   Resp 16   Wt 152 lb (68.9 kg)   SpO2 98%   BMI 24.53 kg/m     Physical Exam   General Appearance:    Alert, cooperative, no distress  Eyes:    PERRL, conjunctiva/corneas clear, EOM's intact       Lungs:     Clear to auscultation bilaterally, respirations unlabored  Heart:    Regular rate and rhythm  Neurologic:   Awake, alert, oriented x 3. No apparent  focal neurological           defect.           Assessment & Plan:     1. Migraine without aura and without status migrainosus, not intractable Will increase to 2 x 50 topiramate a day. Consider change to 100mg  tablets if effective and well tolerated. Advised patient he may reduce to 1 1/2 x 50mg  if any adverse effects from higher dosage.   - topiramate (TOPAMAX) 50 MG tablet; Take 1 tablet (50 mg total) by mouth 2 (two) times daily.  Dispense: 60 tablet; Refill: 1 - oxyCODONE-acetaminophen (PERCOCET) 7.5-325 MG tablet; Take 1 tablet by mouth every 4 (four) hours as needed for severe pain.  Dispense: 20 tablet; Refill: 0       Lelon Huh, MD  [facility]

## 2016-09-16 ENCOUNTER — Encounter: Payer: Self-pay | Admitting: Family Medicine

## 2016-09-16 ENCOUNTER — Ambulatory Visit (INDEPENDENT_AMBULATORY_CARE_PROVIDER_SITE_OTHER): Payer: BLUE CROSS/BLUE SHIELD | Admitting: Family Medicine

## 2016-09-16 VITALS — BP 142/72 | HR 60 | Temp 97.6°F | Resp 16 | Wt 151.0 lb

## 2016-09-16 DIAGNOSIS — G43009 Migraine without aura, not intractable, without status migrainosus: Secondary | ICD-10-CM

## 2016-09-16 DIAGNOSIS — I10 Essential (primary) hypertension: Secondary | ICD-10-CM | POA: Diagnosis not present

## 2016-09-16 MED ORDER — AMLODIPINE BESYLATE 10 MG PO TABS: 10.0000 mg | ORAL_TABLET | Freq: Every day | ORAL | 12 refills | Status: DC

## 2016-09-16 MED ORDER — OXYCODONE-ACETAMINOPHEN 7.5-325 MG PO TABS: 1.0000 | ORAL_TABLET | ORAL | 0 refills | Status: DC | PRN

## 2016-09-16 MED ORDER — PROPRANOLOL HCL ER 160 MG PO CP24: 160.0000 mg | ORAL_CAPSULE | Freq: Every day | ORAL | 12 refills | Status: DC

## 2016-09-16 NOTE — Progress Notes (Signed)
Patient: Lucas Ramirez Male    DOB: 09-Jan-1956   61 y.o.   MRN: [id number] Visit Date: 09/16/2016  Today's Provider: Lelon Huh, MD   Chief Complaint  Patient presents with  . Migraine    follow up    Subjective:    HPI Patient comes in today for a follow up. He was last seen in the office 1 month ago. Since his last visit, topiramate was increased to 50mg  twice daily. Patient reports that he is still having headaches, but they have lessened since he stopped the torpiramate 1 week ago. Patient reports that his headaches were worse when the dose was increased. He reports that he was having 2-3 headaches a week. Patient reports that his last headache was yesterday, and it was relieved with taking Imitrex.  Has recently started on regular exercise program and headaches have became much less frequent. Imitrex is effective when he takes it.     Allergies  Allergen Reactions  . Bupropion Hcl     seizures     Current Outpatient Prescriptions:  .  amLODipine (NORVASC) 10 MG tablet, TAKE 1 TABLET BY MOUTH ONCE DAILY., Disp: 30 tablet, Rfl: 12 .  cholecalciferol (VITAMIN D) 1000 UNITS tablet, Take 1,000 Units by mouth daily., Disp: , Rfl:  .  clonazePAM (KLONOPIN) 1 MG tablet, 1/2 to 1 tablet every six hours as needed for headache, Disp: 30 tablet, Rfl: 2 .  Cyanocobalamin (RA VITAMIN B-12 TR) 1000 MCG TBCR, Take 1,000 mcg by mouth daily., Disp: , Rfl:  .  latanoprost (XALATAN) 0.005 % ophthalmic solution, Place 1 drop into both eyes at bedtime. Reported on 10/15/2015, Disp: , Rfl:  .  Magnesium 500 MG CAPS, Take 1 capsule by mouth daily. , Disp: , Rfl:  .  omeprazole (PRILOSEC) 20 MG capsule, TAKE 1 CAPSULE BY MOUTH ONCE DAILY, Disp: 30 capsule, Rfl: 5 .  oxyCODONE-acetaminophen (PERCOCET) 7.5-325 MG tablet, Take 1 tablet by mouth every 4 (four) hours as needed for severe pain., Disp: 20 tablet, Rfl: 0 .  propranolol ER (INDERAL LA) 160 MG SR capsule, TAKE 1 CAPSULE BY MOUTH ONCE  DAILY, Disp: 30 each, Rfl: 5 .  SUMAtriptan (IMITREX) 100 MG tablet, TAKE 1 TABLET BY MOUTH AT ONSET OF HEADACHE AND MAY TAKE AN ADDITIONAL TABLET IN 2 HOURS IF NEEDED UP TO 2 TABLETS PER 24 HOURS, Disp: 9 tablet, Rfl: 5 .  topiramate (TOPAMAX) 50 MG tablet, Take 1 tablet (50 mg total) by mouth 2 (two) times daily. (Patient not taking: Reported on 09/16/2016), Disp: 60 tablet, Rfl: 1  Review of Systems  Constitutional: Positive for fatigue.  Respiratory: Negative.   Cardiovascular: Negative.   Neurological: Positive for headaches.    Social History  Substance Use Topics  . Smoking status: Current Every Day Smoker    Packs/day: 0.50    Years: 35.00  . Smokeless tobacco: Never Used  . Alcohol use No   Objective:   BP (!) 142/72 (BP Location: Right Arm, Patient Position: Sitting, Cuff Size: Normal)   Pulse 60   Temp 97.6 F (36.4 C)   Resp 16   Wt 151 lb (68.5 kg)   SpO2 99%   BMI 24.37 kg/m  Vitals:   09/16/16 0856  BP: (!) 142/72  Pulse: 60  Resp: 16  Temp: 97.6 F (36.4 C)  SpO2: 99%  Weight: 151 lb (68.5 kg)     Physical Exam  General appearance: alert, well developed, well nourished,  cooperative and in no distress Head: Normocephalic, without obvious abnormality, atraumatic Respiratory: Respirations even and unlabored, normal respiratory rate Extremities: No gross deformities Skin: Skin color, texture, turgor normal. No rashes seen  Psych: Appropriate mood and affect. Neurologic: Mental status: Alert, oriented to person, place, and time, thought content appropriate.     Assessment & Plan:     1. Migraine without aura and without status migrainosus, not intractable Off .topiramate due to lack of effectiveness. Continue prpranolol. Doing better on regular exercise program.  - propranolol ER (INDERAL LA) 160 MG SR capsule; Take 1 capsule (160 mg total) by mouth daily.  Dispense: 30 each; Refill: 12 - oxyCODONE-acetaminophen (PERCOCET) 7.5-325 MG tablet; Take 1  tablet by mouth every 4 (four) hours as needed for severe pain.  Dispense: 20 tablet; Refill: 0  2. Essential hypertension Up today since he missed 2 days of amlodipine. Will return in about 3 months for CPE and BP check.        Lelon Huh, MD  [facility]

## 2016-09-26 ENCOUNTER — Emergency Department
Admission: EM | Admit: 2016-09-26 | Discharge: 2016-09-26 | Disposition: A | Discharge status: Home / Self Care | Payer: BLUE CROSS/BLUE SHIELD | Attending: Dermatology | Admitting: Dermatology

## 2016-09-26 ENCOUNTER — Encounter: Payer: Self-pay | Admitting: *Deleted

## 2016-09-26 DIAGNOSIS — I10 Essential (primary) hypertension: Secondary | ICD-10-CM | POA: Diagnosis not present

## 2016-09-26 DIAGNOSIS — F17200 Nicotine dependence, unspecified, uncomplicated: Secondary | ICD-10-CM | POA: Insufficient documentation

## 2016-09-26 DIAGNOSIS — G43909 Migraine, unspecified, not intractable, without status migrainosus: Secondary | ICD-10-CM | POA: Diagnosis not present

## 2016-09-26 DIAGNOSIS — Z5321 Procedure and treatment not carried out due to patient leaving prior to being seen by health care provider: Secondary | ICD-10-CM | POA: Diagnosis not present

## 2016-09-26 DIAGNOSIS — J449 Chronic obstructive pulmonary disease, unspecified: Secondary | ICD-10-CM | POA: Diagnosis not present

## 2016-09-26 NOTE — ED Triage Notes (Signed)
Pt has a history of migraines, pt reports migraines started this morning and is no different from previous migraines, pt denies nausea/vomiting pt denies any other symptoms, pt took Imitrex and tylenol with no relief

## 2016-10-06 ENCOUNTER — Other Ambulatory Visit: Payer: Self-pay | Admitting: Family Medicine

## 2016-10-06 DIAGNOSIS — G43009 Migraine without aura, not intractable, without status migrainosus: Secondary | ICD-10-CM

## 2016-10-06 MED ORDER — OXYCODONE-ACETAMINOPHEN 7.5-325 MG PO TABS: 1.0000 | ORAL_TABLET | ORAL | 0 refills | Status: DC | PRN

## 2016-10-06 NOTE — Telephone Encounter (Signed)
Pt contacted office for refill request on the following medications: oxyCODONE-acetaminophen (PERCOCET) 7.5-325 MG tablet Last Rx: 09/16/16 (Quantity 20 tablets) LOV: 09/16/16 Please advise. Thanks TNP

## 2016-11-08 ENCOUNTER — Other Ambulatory Visit: Payer: Self-pay | Admitting: Family Medicine

## 2016-11-08 DIAGNOSIS — G43009 Migraine without aura, not intractable, without status migrainosus: Secondary | ICD-10-CM

## 2016-11-08 MED ORDER — OXYCODONE-ACETAMINOPHEN 7.5-325 MG PO TABS: 1.0000 | ORAL_TABLET | ORAL | 0 refills | Status: DC | PRN

## 2016-11-08 NOTE — Telephone Encounter (Signed)
Pt needs refill on his  Oxycodone 7.5-325  Thanks teri

## 2016-11-23 ENCOUNTER — Ambulatory Visit (INDEPENDENT_AMBULATORY_CARE_PROVIDER_SITE_OTHER): Payer: BLUE CROSS/BLUE SHIELD | Admitting: Family Medicine

## 2016-11-23 ENCOUNTER — Encounter: Payer: Self-pay | Admitting: Family Medicine

## 2016-11-23 VITALS — BP 144/72 | HR 64 | Temp 97.5°F | Resp 20 | Wt 154.0 lb

## 2016-11-23 DIAGNOSIS — G43009 Migraine without aura, not intractable, without status migrainosus: Secondary | ICD-10-CM | POA: Diagnosis not present

## 2016-11-23 DIAGNOSIS — G43109 Migraine with aura, not intractable, without status migrainosus: Secondary | ICD-10-CM

## 2016-11-23 MED ORDER — OXYCODONE-ACETAMINOPHEN 7.5-325 MG PO TABS: 1.0000 | ORAL_TABLET | ORAL | 0 refills | Status: DC | PRN

## 2016-11-23 MED ORDER — KETOROLAC TROMETHAMINE 60 MG/2ML IM SOLN
60.0000 mg | Freq: Once | INTRAMUSCULAR | Status: AC
  Administered 2016-11-23: 60 mg via INTRAMUSCULAR

## 2016-11-23 MED ORDER — PROMETHAZINE HCL 25 MG/ML IJ SOLN
25.0000 mg | Freq: Once | INTRAMUSCULAR | Status: AC
Start: 2016-11-23 — End: 2016-11-23
  Administered 2016-11-23: 25 mg via INTRAMUSCULAR

## 2016-11-23 NOTE — Progress Notes (Signed)
Patient: Lucas Ramirez Male    DOB: 01/20/1956   61 y.o.   MRN: [id number] Visit Date: 11/23/2016  Today's Provider: Lelon Huh, MD   Chief Complaint  Patient presents with  . Migraine   Subjective:    HPI Patient comes in today c/o migraine. He does have history of recurrent migraines. He states that this one started yesterday around 2pm. He took Imitrex and Percocet which improved his symptoms. He states that the pain was resolved until 1am this morning. He states that he took another Imitrex this morning around 7am, but it has not helped. He also mentions having an aura or a sense of lightheadedness during his migraine.   Pain is across both sides of forehead. No nausea or vomiting. Very similar.     Allergies  Allergen Reactions  . Bupropion Hcl     seizures     Current Outpatient Prescriptions:  .  amLODipine (NORVASC) 10 MG tablet, Take 1 tablet (10 mg total) by mouth daily., Disp: 30 tablet, Rfl: 12 .  cholecalciferol (VITAMIN D) 1000 UNITS tablet, Take 1,000 Units by mouth daily., Disp: , Rfl:  .  clonazePAM (KLONOPIN) 1 MG tablet, 1/2 to 1 tablet every six hours as needed for headache, Disp: 30 tablet, Rfl: 2 .  Cyanocobalamin (RA VITAMIN B-12 TR) 1000 MCG TBCR, Take 1,000 mcg by mouth daily., Disp: , Rfl:  .  latanoprost (XALATAN) 0.005 % ophthalmic solution, Place 1 drop into both eyes at bedtime. Reported on 10/15/2015, Disp: , Rfl:  .  Magnesium 500 MG CAPS, Take 1 capsule by mouth daily. , Disp: , Rfl:  .  omeprazole (PRILOSEC) 20 MG capsule, TAKE 1 CAPSULE BY MOUTH ONCE DAILY, Disp: 30 capsule, Rfl: 5 .  oxyCODONE-acetaminophen (PERCOCET) 7.5-325 MG tablet, Take 1 tablet by mouth every 4 (four) hours as needed for severe pain., Disp: 20 tablet, Rfl: 0 .  propranolol ER (INDERAL LA) 160 MG SR capsule, Take 1 capsule (160 mg total) by mouth daily., Disp: 30 each, Rfl: 12 .  SUMAtriptan (IMITREX) 100 MG tablet, TAKE 1 TABLET BY MOUTH AT ONSET OF HEADACHE AND  MAY TAKE AN ADDITIONAL TABLET IN 2 HOURS IF NEEDED UP TO 2 TABLETS PER 24 HOURS, Disp: 9 tablet, Rfl: 5  Review of Systems  Constitutional: Positive for activity change and fatigue.  Cardiovascular: Negative.   Neurological: Positive for dizziness, weakness, light-headedness and headaches.    Social History  Substance Use Topics  . Smoking status: Current Every Day Smoker    Packs/day: 0.50    Years: 35.00  . Smokeless tobacco: Never Used  . Alcohol use No   Objective:   BP (!) 144/72 (BP Location: Left Arm, Patient Position: Sitting, Cuff Size: Normal)   Pulse 64   Temp (!) 97.5 F (36.4 C)   Resp 20   Wt 154 lb (69.9 kg)   SpO2 98%   BMI 24.86 kg/m  Vitals:   11/23/16 0856  BP: (!) 144/72  Pulse: 64  Resp: 20  Temp: (!) 97.5 F (36.4 C)  SpO2: 98%  Weight: 154 lb (69.9 kg)      Physical Exam   General Appearance:    Alert, cooperative, no distress  Eyes:    PERRL, conjunctiva/corneas clear, EOM's intact       Lungs:     Clear to auscultation bilaterally, respirations unlabored  Heart:    Regular rate and rhythm  Neurologic:   Awake, alert, oriented x  3. No apparent focal neurological           defect.           Assessment & Plan:     1. Migraine with aura and without status migrainosus, not intractable  - promethazine (PHENERGAN) injection 25 mg; Inject 1 mL (25 mg total) into the muscle once. - ketorolac (TORADOL) injection 60 mg; Inject 2 mLs (60 mg total) into the muscle once.  - oxyCODONE-acetaminophen (PERCOCET) 7.5-325 MG tablet; Take 1 tablet by mouth every 4 (four) hours as needed for severe pain.  Dispense: 20 tablet; Refill: 0      Patient reports his wife brought him to his appointment and will pick him up shortly to drive him home  The entirety of the information documented in the History of Present Illness, Review of Systems and Physical Exam were personally obtained by me. Portions of this information were initially documented by  Wilburt Finlay, CMA and reviewed by me for thoroughness and accuracy.    Lelon Huh, MD  [facility]

## 2016-11-29 DIAGNOSIS — H401131 Primary open-angle glaucoma, bilateral, mild stage: Secondary | ICD-10-CM | POA: Diagnosis not present

## 2016-12-14 ENCOUNTER — Other Ambulatory Visit: Payer: Self-pay | Admitting: Family Medicine

## 2016-12-14 DIAGNOSIS — G43009 Migraine without aura, not intractable, without status migrainosus: Secondary | ICD-10-CM

## 2016-12-22 ENCOUNTER — Encounter: Payer: BLUE CROSS/BLUE SHIELD | Admitting: Family Medicine

## 2016-12-30 ENCOUNTER — Ambulatory Visit (INDEPENDENT_AMBULATORY_CARE_PROVIDER_SITE_OTHER): Payer: BLUE CROSS/BLUE SHIELD | Admitting: Family Medicine

## 2016-12-30 ENCOUNTER — Other Ambulatory Visit: Payer: Self-pay | Admitting: Emergency Medicine

## 2016-12-30 ENCOUNTER — Encounter: Payer: Self-pay | Admitting: Family Medicine

## 2016-12-30 DIAGNOSIS — G43009 Migraine without aura, not intractable, without status migrainosus: Secondary | ICD-10-CM | POA: Diagnosis not present

## 2016-12-30 MED ORDER — CLONAZEPAM 1 MG PO TABS: ORAL_TABLET | ORAL | 3 refills | Status: DC

## 2016-12-30 MED ORDER — TOPIRAMATE 50 MG PO TABS: ORAL_TABLET | ORAL | 1 refills | Status: DC

## 2016-12-30 MED ORDER — KETOROLAC TROMETHAMINE 60 MG/2ML IM SOLN
60.0000 mg | Freq: Once | INTRAMUSCULAR | Status: AC
  Administered 2016-12-30: 60 mg via INTRAMUSCULAR

## 2016-12-30 MED ORDER — OXYCODONE-ACETAMINOPHEN 7.5-325 MG PO TABS: 1.0000 | ORAL_TABLET | ORAL | 0 refills | Status: DC | PRN

## 2016-12-30 MED ORDER — PROMETHAZINE HCL 25 MG/ML IJ SOLN
25.0000 mg | Freq: Once | INTRAMUSCULAR | Status: AC
  Administered 2016-12-30: 25 mg via INTRAMUSCULAR

## 2016-12-30 NOTE — Telephone Encounter (Signed)
Please call in clonazepam  

## 2016-12-30 NOTE — Progress Notes (Signed)
Patient: Lucas Ramirez Male    DOB: [birth year]   61 y.o.   MRN: [id number] Visit Date: 12/30/2016  Today's Provider: Lelon Huh, MD   Chief Complaint  Patient presents with  . Headache   Subjective:    Patient is here concerning recurrent headaches. Patient stated that he has had 3 severe migraines this week.  He had been taking topiramate in the spring up to 100mg  a day. Had seemed to be helping, but headaches worsened in May after dose had been increased. He stopped taking in mid May and headaches improved for several weeks. He returned with worsening headaches August 1 treated with phenergan and ketorolac. Headaches stopped for 3 weeks and did not take any oxycodone or imitrex then developed 3 in the last week as above. Seem to be triggered by stress and lack of sleep.     Allergies  Allergen Reactions  . Bupropion Hcl     seizures     Current Outpatient Prescriptions:  .  amLODipine (NORVASC) 10 MG tablet, Take 1 tablet (10 mg total) by mouth daily., Disp: 30 tablet, Rfl: 12 .  cholecalciferol (VITAMIN D) 1000 UNITS tablet, Take 1,000 Units by mouth daily., Disp: , Rfl:  .  clonazePAM (KLONOPIN) 1 MG tablet, 1/2 to 1 tablet every six hours as needed for headache, Disp: 30 tablet, Rfl: 2 .  Cyanocobalamin (RA VITAMIN B-12 TR) 1000 MCG TBCR, Take 1,000 mcg by mouth daily., Disp: , Rfl:  .  latanoprost (XALATAN) 0.005 % ophthalmic solution, Place 1 drop into both eyes at bedtime. Reported on 10/15/2015, Disp: , Rfl:  .  Magnesium 500 MG CAPS, Take 1 capsule by mouth daily. , Disp: , Rfl:  .  omeprazole (PRILOSEC) 20 MG capsule, TAKE 1 CAPSULE BY MOUTH ONCE DAILY, Disp: 30 capsule, Rfl: 5 .  oxyCODONE-acetaminophen (PERCOCET) 7.5-325 MG tablet, Take 1 tablet by mouth every 4 (four) hours as needed for severe pain., Disp: 20 tablet, Rfl: 0 .  propranolol ER (INDERAL LA) 160 MG SR capsule, Take 1 capsule (160 mg total) by mouth daily., Disp: 30 each, Rfl: 12 .  SUMAtriptan  (IMITREX) 100 MG tablet, TAKE 1 TABLET BY MOUTH AT ONSET OF HEADACHE. MAY TAKE AN ADDITIONAL TABLET IN 2 HOURS IF NEEDED. MAX OF 200mg /24hr, Disp: 9 tablet, Rfl: 6  Review of Systems  Constitutional: Negative for appetite change, chills and fever.  Respiratory: Negative for chest tightness, shortness of breath and wheezing.   Cardiovascular: Negative for chest pain and palpitations.  Gastrointestinal: Negative for abdominal pain, nausea and vomiting.  Neurological: Positive for headaches.    Social History  Substance Use Topics  . Smoking status: Current Every Day Smoker    Packs/day: 0.50    Years: 35.00  . Smokeless tobacco: Never Used  . Alcohol use No   Objective:   BP 134/70 (BP Location: Right Arm, Patient Position: Sitting, Cuff Size: Normal)   Pulse 69   Temp (!) 97.5 F (36.4 C) (Oral)   Resp 16   Wt 159 lb (72.1 kg)   SpO2 99%   BMI 25.66 kg/m  Vitals:   12/30/16 1055  BP: 134/70  Pulse: 69  Resp: 16  Temp: (!) 97.5 F (36.4 C)  TempSrc: Oral  SpO2: 99%  Weight: 159 lb (72.1 kg)     Physical Exam   General Appearance:    Alert, cooperative, no distress, sitting in chair with lights turned off.   Eyes:  PERRL, conjunctiva/corneas clear, EOM's intact       Lungs:     Clear to auscultation bilaterally, respirations unlabored  Heart:    Regular rate and rhythm  Neurologic:   Awake, alert, oriented x 3. No apparent focal neurological           defect.           Assessment & Plan:     1. Migraine without aura and without status migrainosus, not intractable Restart - topiramate (TOPAMAX) 50 MG tablet; 1/2 tablet at bedtime for six days, then increase to 1/2 tablet twice a day  He states he has 50mg  topiramate tablets at home.   - oxyCODONE-acetaminophen (PERCOCET) 7.5-325 MG tablet; Take 1 tablet by mouth every 4 (four) hours as needed for severe pain.  Dispense: 20 tablet; Refill: 0 - ketorolac (TORADOL) injection 60 mg; Inject 2 mLs (60 mg total)  into the muscle once. - promethazine (PHENERGAN) injection 25 mg; Inject 1 mL (25 mg total) into the muscle once.       Lelon Huh, MD  [facility]

## 2016-12-30 NOTE — Telephone Encounter (Signed)
Called in rx as below.

## 2017-02-13 ENCOUNTER — Other Ambulatory Visit: Payer: Self-pay | Admitting: Family Medicine

## 2017-03-22 ENCOUNTER — Other Ambulatory Visit: Payer: Self-pay

## 2017-03-22 ENCOUNTER — Ambulatory Visit: Payer: BLUE CROSS/BLUE SHIELD | Admitting: Family Medicine

## 2017-03-22 ENCOUNTER — Telehealth: Payer: Self-pay | Admitting: Family Medicine

## 2017-03-22 ENCOUNTER — Encounter: Payer: Self-pay | Admitting: Family Medicine

## 2017-03-22 VITALS — BP 144/84 | HR 57 | Temp 97.8°F | Resp 16 | Wt 161.0 lb

## 2017-03-22 DIAGNOSIS — Z23 Encounter for immunization: Secondary | ICD-10-CM

## 2017-03-22 DIAGNOSIS — G43009 Migraine without aura, not intractable, without status migrainosus: Secondary | ICD-10-CM

## 2017-03-22 MED ORDER — OXYCODONE-ACETAMINOPHEN 7.5-325 MG PO TABS: 1.0000 | ORAL_TABLET | ORAL | 0 refills | Status: DC | PRN

## 2017-03-22 MED ORDER — GABAPENTIN 300 MG PO CAPS: 300.0000 mg | ORAL_CAPSULE | Freq: Every day | ORAL | 2 refills | Status: DC | PRN

## 2017-03-22 NOTE — Progress Notes (Signed)
Patient: Lucas Ramirez Male    DOB: 27-Jan-1956   61 y.o.   MRN: [id number] Visit Date: 03/22/2017  Today's Provider: Lelon Huh, MD   Chief Complaint  Patient presents with  . Headache   Subjective:    HPI  Patient is here to discuss headache medication.  States states he tried taking his wife's prescription for gabapentin recently and found it to help with headaches. He had taken it many years ago but didn't do well with it back then He is still taking 50mg  topiramate daily. Had trouble with appetite at higher dose. Is having migraines sporadically, three in the last week, but had none for six weeks before than. He thinks stress from his step-son is a trigger factor.   Allergies  Allergen Reactions  . Bupropion Hcl     seizures     Current Outpatient Medications:  .  amLODipine (NORVASC) 10 MG tablet, Take 1 tablet (10 mg total) by mouth daily., Disp: 30 tablet, Rfl: 12 .  clonazePAM (KLONOPIN) 1 MG tablet, 1/2 to 1 tablet every six hours as needed, Disp: 30 tablet, Rfl: 3 .  Cyanocobalamin (RA VITAMIN B-12 TR) 1000 MCG TBCR, Take 1,000 mcg by mouth daily., Disp: , Rfl:  .  latanoprost (XALATAN) 0.005 % ophthalmic solution, Place 1 drop into both eyes at bedtime. Reported on 10/15/2015, Disp: , Rfl:  .  Magnesium 500 MG CAPS, Take 1 capsule by mouth daily. , Disp: , Rfl:  .  omeprazole (PRILOSEC) 20 MG capsule, TAKE 1 CAPSULE BY MOUTH ONCE DAILY, Disp: 30 capsule, Rfl: 5 .  oxyCODONE-acetaminophen (PERCOCET) 7.5-325 MG tablet, Take 1 tablet by mouth every 4 (four) hours as needed for severe pain., Disp: 20 tablet, Rfl: 0 .  propranolol ER (INDERAL LA) 160 MG SR capsule, Take 1 capsule (160 mg total) by mouth daily., Disp: 30 each, Rfl: 12 .  SUMAtriptan (IMITREX) 100 MG tablet, TAKE 1 TABLET BY MOUTH AT ONSET OF HEADACHE. MAY TAKE AN ADDITIONAL TABLET IN 2 HOURS IF NEEDED. MAX OF 200mg /24hr, Disp: 9 tablet, Rfl: 6 .  topiramate (TOPAMAX) 50 MG tablet, 1/2 tablet at  bedtime for six days, then increase to 1/2 tablet twice a day, Disp: 2 tablet, Rfl: 1 .  cholecalciferol (VITAMIN D) 1000 UNITS tablet, Take 1,000 Units by mouth daily., Disp: , Rfl:   Review of Systems  Constitutional: Negative for appetite change, chills and fever.  Respiratory: Negative for chest tightness, shortness of breath and wheezing.   Cardiovascular: Negative for chest pain and palpitations.  Gastrointestinal: Negative for abdominal pain, nausea and vomiting.  Neurological: Positive for headaches.    Social History   Tobacco Use  . Smoking status: Current Every Day Smoker    Packs/day: 0.50    Years: 35.00    Pack years: 17.50  . Smokeless tobacco: Never Used  Substance Use Topics  . Alcohol use: No   Objective:   BP (!) 144/84 (BP Location: Right Arm, Patient Position: Sitting, Cuff Size: Normal)   Pulse (!) 57   Temp 97.8 F (36.6 C) (Oral)   Resp 16   Wt 161 lb (73 kg)   SpO2 99%   BMI 25.99 kg/m  Vitals:   03/22/17 1144  BP: (!) 144/84  Pulse: (!) 57  Resp: 16  Temp: 97.8 F (36.6 C)  TempSrc: Oral  SpO2: 99%  Weight: 161 lb (73 kg)     Physical Exam   General Appearance:  Alert, cooperative, no distress  Eyes:    PERRL, conjunctiva/corneas clear, EOM's intact       Lungs:     Clear to auscultation bilaterally, respirations unlabored  Heart:    Regular rate and rhythm  Neurologic:   Awake, alert, oriented x 3. No apparent focal neurological           defect.           Assessment & Plan:     1. Migraine without aura and without status migrainosus, not intractable Try adding - gabapentin (NEURONTIN) 300 MG capsule; Take 1 capsule (300 mg total) by mouth daily as needed.  Dispense: 30 capsule; Refill: 2  He can try taking prn, but may take QHS if he needs more prophylaxis.   Refill  oxyCODONE-acetaminophen (PERCOCET) 7.5-325 MG tablet; Take 1 tablet by mouth every 4 (four) hours as needed for severe pain.  Dispense: 20 tablet; Refill:  0  2. Need for influenza vaccination  - Flu Vaccine QUAD 36+ mos IM       Lelon Huh, MD  [facility]

## 2017-03-22 NOTE — Telephone Encounter (Signed)
Pt contacted office for refill request on the following medications:  oxyCODONE-acetaminophen (PERCOCET) 7.5-325 MG tablet  [facility].  GP#[phone]

## 2017-04-01 ENCOUNTER — Other Ambulatory Visit: Payer: Self-pay | Admitting: Family Medicine

## 2017-04-01 DIAGNOSIS — G43009 Migraine without aura, not intractable, without status migrainosus: Secondary | ICD-10-CM

## 2017-04-25 DIAGNOSIS — B029 Zoster without complications: Secondary | ICD-10-CM

## 2017-04-25 HISTORY — DX: Zoster without complications: B02.9

## 2017-04-28 ENCOUNTER — Other Ambulatory Visit: Payer: Self-pay | Admitting: Family Medicine

## 2017-04-28 DIAGNOSIS — H401131 Primary open-angle glaucoma, bilateral, mild stage: Secondary | ICD-10-CM | POA: Diagnosis not present

## 2017-04-28 DIAGNOSIS — G43009 Migraine without aura, not intractable, without status migrainosus: Secondary | ICD-10-CM

## 2017-04-28 MED ORDER — OXYCODONE-ACETAMINOPHEN 7.5-325 MG PO TABS: 1.0000 | ORAL_TABLET | ORAL | 0 refills | Status: DC | PRN

## 2017-04-28 NOTE — Telephone Encounter (Signed)
Patient needs refill on Percocet sent to  [facility]

## 2017-05-05 DIAGNOSIS — H401131 Primary open-angle glaucoma, bilateral, mild stage: Secondary | ICD-10-CM | POA: Diagnosis not present

## 2017-05-31 ENCOUNTER — Other Ambulatory Visit: Payer: Self-pay

## 2017-05-31 DIAGNOSIS — G43009 Migraine without aura, not intractable, without status migrainosus: Secondary | ICD-10-CM

## 2017-05-31 MED ORDER — OXYCODONE-ACETAMINOPHEN 7.5-325 MG PO TABS: 1.0000 | ORAL_TABLET | ORAL | 0 refills | Status: DC | PRN

## 2017-05-31 NOTE — Telephone Encounter (Signed)
Patient called requesting a refill on medication. Thanks!

## 2017-06-27 ENCOUNTER — Other Ambulatory Visit: Payer: Self-pay | Admitting: Family Medicine

## 2017-06-27 DIAGNOSIS — G43009 Migraine without aura, not intractable, without status migrainosus: Secondary | ICD-10-CM

## 2017-06-27 MED ORDER — OXYCODONE-ACETAMINOPHEN 7.5-325 MG PO TABS: 1.0000 | ORAL_TABLET | ORAL | 0 refills | Status: DC | PRN

## 2017-06-27 NOTE — Telephone Encounter (Signed)
Patient is requesting a refill on the following medication ° °oxyCODONE-acetaminophen (PERCOCET) 7.5-325 MG tablet  ° °He uses [facility]. °

## 2017-07-04 DIAGNOSIS — Z85828 Personal history of other malignant neoplasm of skin: Secondary | ICD-10-CM | POA: Diagnosis not present

## 2017-07-04 DIAGNOSIS — D485 Neoplasm of uncertain behavior of skin: Secondary | ICD-10-CM | POA: Diagnosis not present

## 2017-07-05 DIAGNOSIS — L281 Prurigo nodularis: Secondary | ICD-10-CM | POA: Diagnosis not present

## 2017-08-04 ENCOUNTER — Other Ambulatory Visit: Payer: Self-pay | Admitting: Family Medicine

## 2017-08-04 DIAGNOSIS — G43009 Migraine without aura, not intractable, without status migrainosus: Secondary | ICD-10-CM

## 2017-08-04 MED ORDER — OXYCODONE-ACETAMINOPHEN 7.5-325 MG PO TABS: 1.0000 | ORAL_TABLET | ORAL | 0 refills | Status: DC | PRN

## 2017-08-04 NOTE — Telephone Encounter (Signed)
Please review. Thanks!  

## 2017-08-04 NOTE — Telephone Encounter (Signed)
Pt needs refill on his Percocet  725.325  [facility] in [place]  Call back is [phone]  Thanks Con Memos

## 2017-08-23 ENCOUNTER — Other Ambulatory Visit: Payer: Self-pay | Admitting: Family Medicine

## 2017-09-06 ENCOUNTER — Other Ambulatory Visit: Payer: Self-pay

## 2017-09-06 DIAGNOSIS — G43009 Migraine without aura, not intractable, without status migrainosus: Secondary | ICD-10-CM

## 2017-09-06 MED ORDER — OXYCODONE-ACETAMINOPHEN 7.5-325 MG PO TABS: 1.0000 | ORAL_TABLET | ORAL | 0 refills | Status: DC | PRN

## 2017-09-06 NOTE — Telephone Encounter (Signed)
Patient called requesting refills on medication.

## 2017-09-21 ENCOUNTER — Other Ambulatory Visit: Payer: Self-pay | Admitting: Family Medicine

## 2017-09-21 DIAGNOSIS — G43009 Migraine without aura, not intractable, without status migrainosus: Secondary | ICD-10-CM

## 2017-10-19 ENCOUNTER — Other Ambulatory Visit: Payer: Self-pay | Admitting: Family Medicine

## 2017-10-19 DIAGNOSIS — G43009 Migraine without aura, not intractable, without status migrainosus: Secondary | ICD-10-CM

## 2017-10-27 ENCOUNTER — Other Ambulatory Visit: Payer: Self-pay

## 2017-10-27 DIAGNOSIS — G43009 Migraine without aura, not intractable, without status migrainosus: Secondary | ICD-10-CM

## 2017-10-27 NOTE — Telephone Encounter (Signed)
Pt requesting a refill of percocet. [facility]. Contact information is correct.

## 2017-10-28 MED ORDER — OXYCODONE-ACETAMINOPHEN 7.5-325 MG PO TABS: 1.0000 | ORAL_TABLET | ORAL | 0 refills | Status: DC | PRN

## 2017-11-02 DIAGNOSIS — H401131 Primary open-angle glaucoma, bilateral, mild stage: Secondary | ICD-10-CM | POA: Diagnosis not present

## 2018-01-22 ENCOUNTER — Other Ambulatory Visit: Payer: Self-pay | Admitting: Family Medicine

## 2018-01-22 DIAGNOSIS — G43009 Migraine without aura, not intractable, without status migrainosus: Secondary | ICD-10-CM

## 2018-02-01 ENCOUNTER — Other Ambulatory Visit: Payer: Self-pay | Admitting: Family Medicine

## 2018-02-01 DIAGNOSIS — G43009 Migraine without aura, not intractable, without status migrainosus: Secondary | ICD-10-CM

## 2018-02-01 MED ORDER — OXYCODONE-ACETAMINOPHEN 7.5-325 MG PO TABS: 1.0000 | ORAL_TABLET | ORAL | 0 refills | Status: DC | PRN

## 2018-02-01 NOTE — Telephone Encounter (Signed)
Pt requesting refill oxyCODONE-acetaminophen (PERCOCET) 7.5-325 MG tablet sent to [facility].  Pt states he is out of medication.

## 2018-02-01 NOTE — Telephone Encounter (Signed)
Please review. Thanks!  

## 2018-02-26 ENCOUNTER — Other Ambulatory Visit: Payer: Self-pay | Admitting: Family Medicine

## 2018-02-26 DIAGNOSIS — G43009 Migraine without aura, not intractable, without status migrainosus: Secondary | ICD-10-CM

## 2018-02-26 MED ORDER — OXYCODONE-ACETAMINOPHEN 7.5-325 MG PO TABS: 1.0000 | ORAL_TABLET | ORAL | 0 refills | Status: DC | PRN

## 2018-02-26 NOTE — Telephone Encounter (Signed)
Patient needs refill on Percocet 7.5 -325 mg. Sent to [facility].

## 2018-04-03 ENCOUNTER — Other Ambulatory Visit: Payer: Self-pay

## 2018-04-03 DIAGNOSIS — G43009 Migraine without aura, not intractable, without status migrainosus: Secondary | ICD-10-CM

## 2018-04-03 MED ORDER — OXYCODONE-ACETAMINOPHEN 7.5-325 MG PO TABS: 1.0000 | ORAL_TABLET | ORAL | 0 refills | Status: DC | PRN

## 2018-04-03 NOTE — Telephone Encounter (Signed)
Patient called office requesting refill on Percocet sent to [facility]. KW

## 2018-05-01 DIAGNOSIS — H401131 Primary open-angle glaucoma, bilateral, mild stage: Secondary | ICD-10-CM | POA: Diagnosis not present

## 2018-05-08 DIAGNOSIS — H401131 Primary open-angle glaucoma, bilateral, mild stage: Secondary | ICD-10-CM | POA: Diagnosis not present

## 2018-05-15 ENCOUNTER — Telehealth: Payer: Self-pay | Admitting: Family Medicine

## 2018-05-15 DIAGNOSIS — G43009 Migraine without aura, not intractable, without status migrainosus: Secondary | ICD-10-CM

## 2018-05-15 NOTE — Telephone Encounter (Signed)
Please review

## 2018-05-15 NOTE — Telephone Encounter (Signed)
Patient is requesting a refill on the following medication  oxyCODONE-acetaminophen (PERCOCET) 7.5-325 MG tablet   He uses [facility].

## 2018-05-16 MED ORDER — OXYCODONE-ACETAMINOPHEN 7.5-325 MG PO TABS: 1.0000 | ORAL_TABLET | ORAL | 0 refills | Status: DC | PRN

## 2018-05-16 NOTE — Addendum Note (Signed)
Addended by: Birdie Sons on: 05/16/2018 12:46 PM   Modules accepted: Orders

## 2018-05-18 ENCOUNTER — Ambulatory Visit: Payer: BLUE CROSS/BLUE SHIELD | Admitting: Family Medicine

## 2018-05-18 ENCOUNTER — Encounter: Payer: Self-pay | Admitting: Family Medicine

## 2018-05-18 VITALS — BP 128/77 | HR 62 | Temp 97.8°F | Resp 16 | Wt 162.0 lb

## 2018-05-18 DIAGNOSIS — M79672 Pain in left foot: Secondary | ICD-10-CM

## 2018-05-18 DIAGNOSIS — R200 Anesthesia of skin: Secondary | ICD-10-CM

## 2018-05-18 DIAGNOSIS — G43109 Migraine with aura, not intractable, without status migrainosus: Secondary | ICD-10-CM | POA: Diagnosis not present

## 2018-05-18 DIAGNOSIS — R202 Paresthesia of skin: Secondary | ICD-10-CM

## 2018-05-18 NOTE — Patient Instructions (Signed)
.   Please bring all of your medications to every appointment so we can make sure that our medication list is the same as yours.   

## 2018-05-18 NOTE — Progress Notes (Signed)
Patient: Lucas Ramirez Male    DOB: Jan 09, 1956   63 y.o.   MRN: [id number] Visit Date: 05/18/2018  Today's Provider: Lelon Huh, MD   Chief Complaint  Patient presents with  . Foot Problem   Subjective:     HPI Feet Problems: Patient comes in today requesting a referral to a specialist to evaluate his feet. He states he has have numbness and pain in his feet. This has been ongoing for 10 years. He reports recently the pain has worsened, especially of the dorsum of his left foot associated with swelling over the last few days.  The top of his left foot is sore to touch. These is some mild swelling of the left foot also. Patient is starting to affect his gait. Painful to bear weight and walk.   Was seen by Dr. Manuella Ghazi in 2016 and 2017 with numbness in his right foot. Reviewed extensive normal labs done at that time, including normal nerve conduction studies.    Allergies  Allergen Reactions  . Bupropion Hcl     seizures     Current Outpatient Medications:  .  amLODipine (NORVASC) 10 MG tablet, TAKE 1 TABLET BY MOUTH ONCE DAILY, Disp: 30 tablet, Rfl: 12 .  cholecalciferol (VITAMIN D) 1000 UNITS tablet, Take 1,000 Units by mouth daily., Disp: , Rfl:  .  clonazePAM (KLONOPIN) 1 MG tablet, TAKE 1/2 TO 1 TABLET BY MOUTH EVERY 6 HOURS AS NEEDED, Disp: 30 tablet, Rfl: 3 .  Cyanocobalamin (RA VITAMIN B-12 TR) 1000 MCG TBCR, Take 1,000 mcg by mouth daily., Disp: , Rfl:  .  gabapentin (NEURONTIN) 300 MG capsule, TAKE 1 CAPSULE BY MOUTH ONCE DAILY AS NEEDED, Disp: 30 capsule, Rfl: 11 .  latanoprost (XALATAN) 0.005 % ophthalmic solution, Place 1 drop into both eyes at bedtime. Reported on 10/15/2015, Disp: , Rfl:  .  Magnesium 500 MG CAPS, Take 1 capsule by mouth daily. , Disp: , Rfl:  .  omeprazole (PRILOSEC) 20 MG capsule, TAKE 1 CAPSULE BY MOUTH ONCE DAILY, Disp: 30 capsule, Rfl: 11 .  oxyCODONE-acetaminophen (PERCOCET) 7.5-325 MG tablet, Take 1 tablet by mouth every 4 (four) hours as  needed for severe pain., Disp: 20 tablet, Rfl: 0 .  propranolol ER (INDERAL LA) 160 MG SR capsule, TAKE 1 CAPSULE BY MOUTH ONCE DAILY, Disp: 30 each, Rfl: 12 .  SUMAtriptan (IMITREX) 100 MG tablet, TAKE 1 TABLET BY MOUTH AT ONSET OF HEADACHE MAY TAKE AN ADDITIONAL TABLET IN 2 HOURS IF NEEDED MAX OF 200MG /24HR, Disp: 9 tablet, Rfl: 6 .  topiramate (TOPAMAX) 50 MG tablet, TAKE 1 TABLET BY MOUTH ONCE DAILY, Disp: 30 tablet, Rfl: 11  Review of Systems  Constitutional: Negative for appetite change, chills and fever.  Respiratory: Negative for chest tightness, shortness of breath and wheezing.   Cardiovascular: Negative for chest pain and palpitations.  Gastrointestinal: Negative for abdominal pain, nausea and vomiting.  Musculoskeletal: Positive for arthralgias and joint swelling.  Neurological: Positive for numbness and headaches.    Social History   Tobacco Use  . Smoking status: Current Every Day Smoker    Packs/day: 0.25    Years: 35.00    Pack years: 8.75    Types: Cigarettes  . Smokeless tobacco: Never Used  Substance Use Topics  . Alcohol use: No      Objective:   BP 128/77 (BP Location: Left Arm, Patient Position: Sitting, Cuff Size: Large)   Pulse 62   Temp 97.8 F (36.6  C) (Oral)   Resp 16   Wt 162 lb (73.5 kg)   SpO2 99% Comment: room air  BMI 26.15 kg/m  Vitals:   05/18/18 0844  BP: 128/77  Pulse: 62  Resp: 16  Temp: 97.8 F (36.6 C)  TempSrc: Oral  SpO2: 99%  Weight: 162 lb (73.5 kg)    Physical Exam  General appearance: alert, well developed, well nourished, cooperative and in no distress Head: Normocephalic, without obvious abnormality, atraumatic Respiratory: Respirations even and unlabored, normal respiratory rate Extremities: slightly swollen tender dorsum of left mid foot.      Assessment & Plan    1. Foot pain, left Recent onset in absence of trauma - Ambulatory referral to Podiatry  2. Numbness and tingling of foot Negative neurology  work up by Dr. Brigitte Pulse 3 years ago.  - Ambulatory referral to Podiatry  3. Migraine with aura and without status migrainosus, not intractable Fairly well controlled on current medication regiment, reporting only requiring hydrocodone/apap once or twice every couple of weeks.      Lelon Huh, MD  [facility]

## 2018-05-22 ENCOUNTER — Ambulatory Visit: Payer: BLUE CROSS/BLUE SHIELD | Admitting: Podiatry

## 2018-05-22 ENCOUNTER — Other Ambulatory Visit: Payer: Self-pay | Admitting: Podiatry

## 2018-05-22 ENCOUNTER — Encounter: Payer: Self-pay | Admitting: Podiatry

## 2018-05-22 ENCOUNTER — Ambulatory Visit (INDEPENDENT_AMBULATORY_CARE_PROVIDER_SITE_OTHER): Payer: BLUE CROSS/BLUE SHIELD

## 2018-05-22 DIAGNOSIS — G5761 Lesion of plantar nerve, right lower limb: Secondary | ICD-10-CM

## 2018-05-22 DIAGNOSIS — M722 Plantar fascial fibromatosis: Secondary | ICD-10-CM

## 2018-05-22 DIAGNOSIS — M7752 Other enthesopathy of left foot: Secondary | ICD-10-CM | POA: Diagnosis not present

## 2018-05-22 DIAGNOSIS — M779 Enthesopathy, unspecified: Principal | ICD-10-CM

## 2018-05-22 DIAGNOSIS — M778 Other enthesopathies, not elsewhere classified: Secondary | ICD-10-CM

## 2018-05-22 MED ORDER — MELOXICAM 15 MG PO TABS: 15.0000 mg | ORAL_TABLET | Freq: Every day | ORAL | 1 refills | Status: AC

## 2018-05-29 NOTE — Progress Notes (Signed)
   HPI: 63 year old male presenting today as a new patient with a chief complaint of sharp, shooting pain on the dorsal left foot, beginning one week ago, and the plantar right foot, beginning a few years ago. He reports associated numbness of the right plantar forefoot. Walking and bending his toes increases the pain. He has not done anything for treatment. Patient is here for further evaluation and treatment.   Past Medical History:  Diagnosis Date  . CAP (community acquired pneumonia) 06/28/2015  . COPD (chronic obstructive pulmonary disease) ([place])   . Hypertension   . Migraine   . Shingles 2019   dx by dermatologist by patient report      Physical Exam: General: The patient is alert and oriented x3 in no acute distress.  Dermatology: Skin is warm, dry and supple bilateral lower extremities. Negative for open lesions or macerations.  Vascular: Palpable pedal pulses bilaterally. No edema or erythema noted. Capillary refill within normal limits.  Neurological: Epicritic and protective threshold grossly intact bilaterally.   Musculoskeletal Exam: Sharp pain with palpation of the 3rd interspace and lateral compression of the metatarsal heads consistent with neuroma.  Positive Conley Canal sign with loadbearing of the forefoot. Tenderness to palpation of the dorsal left foot.   Radiographic Exam:  Normal osseous mineralization. Joint spaces preserved. No fracture/dislocation/boney destruction.    Assessment: 1. Morton's neuroma 3rd interspace right foot 2. Extensor tendinitis left    Plan of Care:  1. Patient was evaluated. X-Rays reviewed.  2. Injection of 0.5 mLs Celestone Soluspan injected into the extensor tendon sheath of the left foot.  3. Prescription for Meloxicam provided to patient. 4. Recommended good shoe gear.  5. Return to clinic as needed.    Edrick Kins, DPM [facility]  Dr. Edrick Kins, [address]                                         [place], [place] [address]                Office [phone]  Fax [phone]

## 2018-05-31 ENCOUNTER — Other Ambulatory Visit: Payer: Self-pay | Admitting: Family Medicine

## 2018-05-31 DIAGNOSIS — G43009 Migraine without aura, not intractable, without status migrainosus: Secondary | ICD-10-CM

## 2018-06-15 ENCOUNTER — Other Ambulatory Visit: Payer: Self-pay

## 2018-06-15 DIAGNOSIS — G43009 Migraine without aura, not intractable, without status migrainosus: Secondary | ICD-10-CM

## 2018-06-15 MED ORDER — OXYCODONE-ACETAMINOPHEN 7.5-325 MG PO TABS: 1.0000 | ORAL_TABLET | ORAL | 0 refills | Status: DC | PRN

## 2018-06-15 NOTE — Telephone Encounter (Signed)
Patient requesting refills. [facility]. Thanks!

## 2018-06-29 ENCOUNTER — Other Ambulatory Visit: Payer: Self-pay | Admitting: Family Medicine

## 2018-06-29 DIAGNOSIS — G43009 Migraine without aura, not intractable, without status migrainosus: Secondary | ICD-10-CM

## 2018-07-03 ENCOUNTER — Encounter: Payer: Self-pay | Admitting: Podiatry

## 2018-07-03 ENCOUNTER — Ambulatory Visit: Payer: BLUE CROSS/BLUE SHIELD | Admitting: Podiatry

## 2018-07-03 DIAGNOSIS — G5761 Lesion of plantar nerve, right lower limb: Secondary | ICD-10-CM | POA: Diagnosis not present

## 2018-07-03 DIAGNOSIS — M7752 Other enthesopathy of left foot: Secondary | ICD-10-CM | POA: Diagnosis not present

## 2018-07-03 MED ORDER — MELOXICAM 15 MG PO TABS: 15.0000 mg | ORAL_TABLET | Freq: Every day | ORAL | 1 refills | Status: DC

## 2018-07-05 NOTE — Progress Notes (Signed)
   HPI: 63 year old male presenting today for follow up evaluation of right foot pain secondary to a Morton's neuroma. He reports intermittent sharp, stabbing pain that worsened in the past 1-2 weeks. There are no worsening factors noted and he has been taking Tylenol for treatment. Patient is here for further evaluation and treatment.   Past Medical History:  Diagnosis Date  . CAP (community acquired pneumonia) 06/28/2015  . COPD (chronic obstructive pulmonary disease) ([place])   . Hypertension   . Migraine   . Shingles 2019   dx by dermatologist by patient report      Physical Exam: General: The patient is alert and oriented x3 in no acute distress.  Dermatology: Skin is warm, dry and supple bilateral lower extremities. Negative for open lesions or macerations.  Vascular: Palpable pedal pulses bilaterally. No edema or erythema noted. Capillary refill within normal limits.  Neurological: Epicritic and protective threshold grossly intact bilaterally.   Musculoskeletal Exam: Sharp pain with palpation of the 3rd interspace and lateral compression of the metatarsal heads consistent with neuroma.  Positive Conley Canal sign with loadbearing of the forefoot.    Radiographic Exam:  Normal osseous mineralization. Joint spaces preserved. No fracture/dislocation/boney destruction.    Assessment: 1. Morton's neuroma 3rd interspace right foot 2. Extensor tendinitis left - resolved   Plan of Care:  1. Patient was evaluated. X-Rays reviewed.  2. Injection of 0.5 mLs Celestone Soluspan injected into the neuroma of the right foot.  3. Refill prescription for Meloxicam provided to patient. 4. Continue wearing SAS shoes.   5. Return to clinic in 4 weeks.    Edrick Kins, DPM [facility]  Dr. Edrick Kins, [address]                                        [place], [place] [address]                Office [phone]  Fax [phone]

## 2018-07-09 ENCOUNTER — Telehealth: Payer: Self-pay | Admitting: *Deleted

## 2018-07-09 NOTE — Telephone Encounter (Signed)
Pt states he received a shot in the right foot neuroma. I reviewed the clinical and he received a steroid shot in the right foot. I told pt that occasionally after receiving a steroid injection some pts experience a steroid flare of symptoms. Pt states he is using tylenol. I told pt he could use the tylenol as directed on the package and continue the meloxicam, go in to a stiff bottom shoe and ice 3-4 times a day for 15 minutes.

## 2018-07-09 NOTE — Telephone Encounter (Signed)
Pt called states he has an appt on 07/31/2018, but there have been some changes in the foot since the last appt.

## 2018-07-13 ENCOUNTER — Other Ambulatory Visit: Payer: Self-pay | Admitting: Family Medicine

## 2018-07-13 DIAGNOSIS — G43009 Migraine without aura, not intractable, without status migrainosus: Secondary | ICD-10-CM

## 2018-07-13 MED ORDER — OXYCODONE-ACETAMINOPHEN 7.5-325 MG PO TABS: 1.0000 | ORAL_TABLET | ORAL | 0 refills | Status: DC | PRN

## 2018-07-13 NOTE — Telephone Encounter (Signed)
Please review. Thanks!  

## 2018-07-13 NOTE — Telephone Encounter (Signed)
Needs refill on  Oxycodone 7-325  [facility]  CB# [phone]    TERI

## 2018-07-24 ENCOUNTER — Encounter: Payer: Self-pay | Admitting: Podiatry

## 2018-07-24 ENCOUNTER — Other Ambulatory Visit: Payer: Self-pay

## 2018-07-24 ENCOUNTER — Ambulatory Visit (INDEPENDENT_AMBULATORY_CARE_PROVIDER_SITE_OTHER): Payer: BLUE CROSS/BLUE SHIELD | Admitting: Podiatry

## 2018-07-24 VITALS — Temp 97.1°F

## 2018-07-24 DIAGNOSIS — G5761 Lesion of plantar nerve, right lower limb: Secondary | ICD-10-CM

## 2018-07-24 DIAGNOSIS — M7752 Other enthesopathy of left foot: Secondary | ICD-10-CM | POA: Diagnosis not present

## 2018-07-24 DIAGNOSIS — G43009 Migraine without aura, not intractable, without status migrainosus: Secondary | ICD-10-CM | POA: Diagnosis not present

## 2018-07-24 MED ORDER — GABAPENTIN 100 MG PO CAPS: 100.0000 mg | ORAL_CAPSULE | Freq: Three times a day (TID) | ORAL | 2 refills | Status: DC

## 2018-07-24 NOTE — Progress Notes (Signed)
   HPI: 63 year old male presenting today for follow up evaluation of right foot pain secondary to a Morton's neuroma.  Patient continues to experience sharp stabbing sensations intermittently throughout the day that last approximately 1-2 seconds.  The injections he received last visit on 07/03/2018 did not help to alleviate symptoms.  Sharp pain is elicited whenever he twists his foot a certain way.  Patient is now complaining of sharp stabbing shooting sensations that extend proximal up to the level of the leg.  Past Medical History:  Diagnosis Date  . CAP (community acquired pneumonia) 06/28/2015  . COPD (chronic obstructive pulmonary disease) ([place])   . Hypertension   . Migraine   . Shingles 2019   dx by dermatologist by patient report      Physical Exam: General: The patient is alert and oriented x3 in no acute distress.  Dermatology: Skin is warm, dry and supple bilateral lower extremities. Negative for open lesions or macerations.  Vascular: Palpable pedal pulses bilaterally. No edema or erythema noted. Capillary refill within normal limits.  Neurological: Epicritic and protective threshold grossly intact bilaterally.   Musculoskeletal Exam: Negative for any Sharp pain with palpation of the 3rd interspace and lateral compression of the metatarsal heads that would be consistent with neuroma.  There is diffuse tenderness throughout the forefoot consistent with metatarsalgia  Assessment: 1. Morton's neuroma 3rd interspace right foot 2. Extensor tendinitis left - resolved 3.  Metatarsalgia right foot 4.  Generalized neuritis right   Plan of Care:  1. Patient was evaluated.   2.  Today wearing her prescribed gabapentin 100 mg 3 times daily 3.  Continue meloxicam daily 4.  Continue wearing SAS shoes 5.  Return to clinic in 6 weeks 6.  If gabapentin does help to alleviate symptoms we may need to recommend nerve conduction study to determine the level of the neurological pathology  and check large fiber motor neurons   Edrick Kins, DPM [facility]  Dr. Edrick Kins, [address]                                        [place],  [address]                Office [phone]  Fax [phone]

## 2018-07-31 ENCOUNTER — Ambulatory Visit: Payer: BLUE CROSS/BLUE SHIELD | Admitting: Podiatry

## 2018-08-06 ENCOUNTER — Other Ambulatory Visit: Payer: Self-pay

## 2018-08-06 DIAGNOSIS — G43009 Migraine without aura, not intractable, without status migrainosus: Secondary | ICD-10-CM

## 2018-08-06 MED ORDER — OXYCODONE-ACETAMINOPHEN 7.5-325 MG PO TABS: 1.0000 | ORAL_TABLET | ORAL | 0 refills | Status: DC | PRN

## 2018-08-06 NOTE — Telephone Encounter (Signed)
Patient is requesting a refill on his Percocet 7.5-325 MG tablets. Please advise.

## 2018-08-07 ENCOUNTER — Encounter: Payer: Self-pay | Admitting: Podiatry

## 2018-08-07 ENCOUNTER — Ambulatory Visit (INDEPENDENT_AMBULATORY_CARE_PROVIDER_SITE_OTHER): Payer: BLUE CROSS/BLUE SHIELD | Admitting: Podiatry

## 2018-08-07 ENCOUNTER — Other Ambulatory Visit: Payer: Self-pay

## 2018-08-07 VITALS — Temp 97.9°F

## 2018-08-07 DIAGNOSIS — G5761 Lesion of plantar nerve, right lower limb: Secondary | ICD-10-CM | POA: Diagnosis not present

## 2018-08-07 MED ORDER — TRAMADOL HCL 50 MG PO TABS: 50.0000 mg | ORAL_TABLET | Freq: Three times a day (TID) | ORAL | 0 refills | Status: DC | PRN

## 2018-08-07 NOTE — Progress Notes (Signed)
   HPI: 63 year old male presenting today for follow up evaluation of right foot pain secondary to a Morton's neuroma and generalized metatarsalgia.  Patient continues to experience sharp pains and he says the pain is getting worse in the right foot.  Gabapentin and meloxicam are of no help.  He states the pain is actually worse.  Pain is now diffuse throughout the entire forefoot  Past Medical History:  Diagnosis Date  . CAP (community acquired pneumonia) 06/28/2015  . COPD (chronic obstructive pulmonary disease) ([place])   . Hypertension   . Migraine   . Shingles 2019   dx by dermatologist by patient report      Physical Exam: General: The patient is alert and oriented x3 in no acute distress.  Dermatology: Skin is warm, dry and supple bilateral lower extremities. Negative for open lesions or macerations.  Vascular: Palpable pedal pulses bilaterally. No edema or erythema noted. Capillary refill within normal limits.  Neurological: Epicritic and protective threshold grossly intact bilaterally.   Musculoskeletal Exam: Negative for any Sharp pain with palpation of the 3rd interspace and lateral compression of the metatarsal heads that would be consistent with neuroma.  There is diffuse tenderness throughout the forefoot consistent with metatarsalgia  Assessment: 1. Morton's neuroma 3rd interspace right foot-unchanged 2. Extensor tendinitis left - resolved 3.  Metatarsalgia right foot-unchanged 4.  Generalized neuritis right-unchanged   Plan of Care:  1. Patient was evaluated.   2.  Continue wearing SAS shoes 3.  Patient says that he has seen a neurologist for this in the past but nerve conduction studies taken are within normal limits 4.  Metatarsal pads dispensed to the patient to try to offload the forefoot 5.  Prescription for tramadol 50 mg as needed 6.  Continue gabapentin and meloxicam daily as needed 7.  Return to clinic as needed  Edrick Kins, DPM [facility]  Dr. Edrick Kins, [address]                                        [place], [place] [address]                Office [phone]  Fax [phone]      .

## 2018-08-15 ENCOUNTER — Telehealth: Payer: Self-pay

## 2018-08-15 NOTE — Telephone Encounter (Signed)
  CB# [phone]  Patient is still having a lot of pain in his foot.  He states that the Neurologist and the Podiatrist have not been able to give him any answers.  He is out of pain medication and would like to be referred to someone else that might be able to help.

## 2018-08-17 ENCOUNTER — Telehealth: Payer: Self-pay

## 2018-08-17 DIAGNOSIS — R200 Anesthesia of skin: Secondary | ICD-10-CM

## 2018-08-17 DIAGNOSIS — R202 Paresthesia of skin: Secondary | ICD-10-CM

## 2018-08-17 DIAGNOSIS — M79672 Pain in left foot: Secondary | ICD-10-CM

## 2018-08-17 DIAGNOSIS — G43009 Migraine without aura, not intractable, without status migrainosus: Secondary | ICD-10-CM

## 2018-08-17 MED ORDER — GABAPENTIN 300 MG PO CAPS: 300.0000 mg | ORAL_CAPSULE | Freq: Every day | ORAL | 3 refills | Status: DC

## 2018-08-17 MED ORDER — PREDNISONE 10 MG PO TABS: ORAL_TABLET | ORAL | 0 refills | Status: AC

## 2018-08-17 MED ORDER — OXYCODONE-ACETAMINOPHEN 7.5-325 MG PO TABS: 1.0000 | ORAL_TABLET | ORAL | 0 refills | Status: DC | PRN

## 2018-08-17 NOTE — Telephone Encounter (Signed)
He reports he continues to have burning and stinging pain across top of foot sometimes going into anterior ankle and leg that are keeping him up night. Had injection of Morton's neuroma by Dr. Rebekah Chesterfield and trial of gabapentin, neither of which made much difference. Had normal NCS by Dr. Manuella Ghazi in 2017. He is wondering if there is anything else to try and getting a second opinion. May have nerve entrapment. Will try course of prednisone, start back on hs gabapentin, and initiate referral to another podiatrist.

## 2018-08-17 NOTE — Telephone Encounter (Signed)
Patient calling back regarding message he left yesterday. He would like to speak with you if possible.

## 2018-08-23 DIAGNOSIS — M79671 Pain in right foot: Secondary | ICD-10-CM | POA: Diagnosis not present

## 2018-08-23 DIAGNOSIS — M65871 Other synovitis and tenosynovitis, right ankle and foot: Secondary | ICD-10-CM | POA: Diagnosis not present

## 2018-08-23 DIAGNOSIS — G5761 Lesion of plantar nerve, right lower limb: Secondary | ICD-10-CM | POA: Diagnosis not present

## 2018-08-27 ENCOUNTER — Other Ambulatory Visit: Payer: Self-pay | Admitting: Family Medicine

## 2018-09-04 ENCOUNTER — Ambulatory Visit: Payer: BLUE CROSS/BLUE SHIELD | Admitting: Podiatry

## 2018-09-07 ENCOUNTER — Ambulatory Visit: Payer: BLUE CROSS/BLUE SHIELD | Admitting: Family Medicine

## 2018-09-07 ENCOUNTER — Ambulatory Visit
Admission: RE | Admit: 2018-09-07 | Discharge: 2018-09-07 | Disposition: A | Discharge status: Home / Self Care | Payer: BLUE CROSS/BLUE SHIELD | Source: Ambulatory Visit | Attending: Family Medicine | Admitting: Family Medicine

## 2018-09-07 ENCOUNTER — Encounter: Payer: Self-pay | Admitting: Family Medicine

## 2018-09-07 ENCOUNTER — Other Ambulatory Visit: Payer: Self-pay

## 2018-09-07 VITALS — BP 122/70 | HR 60 | Temp 98.2°F | Resp 16 | Wt 159.0 lb

## 2018-09-07 DIAGNOSIS — G43009 Migraine without aura, not intractable, without status migrainosus: Secondary | ICD-10-CM | POA: Diagnosis not present

## 2018-09-07 DIAGNOSIS — M5417 Radiculopathy, lumbosacral region: Secondary | ICD-10-CM | POA: Insufficient documentation

## 2018-09-07 DIAGNOSIS — M79672 Pain in left foot: Secondary | ICD-10-CM | POA: Diagnosis not present

## 2018-09-07 DIAGNOSIS — M4185 Other forms of scoliosis, thoracolumbar region: Secondary | ICD-10-CM | POA: Diagnosis not present

## 2018-09-07 MED ORDER — GABAPENTIN 300 MG PO CAPS: ORAL_CAPSULE | ORAL | 3 refills | Status: DC

## 2018-09-07 MED ORDER — OXYCODONE-ACETAMINOPHEN 7.5-325 MG PO TABS: 1.0000 | ORAL_TABLET | ORAL | 0 refills | Status: DC | PRN

## 2018-09-07 NOTE — Progress Notes (Signed)
Patient: Lucas Ramirez Male    DOB: January 11, 1956   63 y.o.   MRN: [id number] Visit Date: 09/07/2018  Today's Provider: Lelon Huh, MD   Chief Complaint  Patient presents with  . Foot Pain   Subjective:     Foot Pain  Associated symptoms include arthralgias and myalgias. Pertinent negatives include no chills, diaphoresis or fatigue.  Patient is here to discuss right foot pain. He reports that this is an ongoing issue. He reports that he has seen 2 podiatrist since he has had this issue. He reports that he was treated with anti inflammatory medications, which has not helped. He reports that one podiatrist feels that it may be a nerve issue. I prescribed a trial of gabapentin after telephone call on 08-13-2018 which he state brings pain down from a six to a four. He has started taking one during the day in addition to one a nigh. He has taken gabapentin with minimal relief. He reports that the pain is starting to radiate up the lateral aspect of leg to his knee and is worse on right. He did have this evaluated by Dr. Brigitte Pulse a few years ago and normal NCS. He does have history of scoliosis, but back pain is no worse than usual.   Allergies  Allergen Reactions  . Bupropion Hcl     seizures     Current Outpatient Medications:  .  amLODipine (NORVASC) 10 MG tablet, TAKE 1 TABLET BY MOUTH ONCE DAILY, Disp: 30 tablet, Rfl: 12 .  cholecalciferol (VITAMIN D) 1000 UNITS tablet, Take 1,000 Units by mouth daily., Disp: , Rfl:  .  clonazePAM (KLONOPIN) 1 MG tablet, TAKE 1/2 TO 1 TABLET BY MOUTH EVERY 6 HOURS AS NEEDED, Disp: 30 tablet, Rfl: 5 .  Cyanocobalamin (RA VITAMIN B-12 TR) 1000 MCG TBCR, Take 1,000 mcg by mouth daily., Disp: , Rfl:  .  gabapentin (NEURONTIN) 300 MG capsule, Take 1 capsule (300 mg total) by mouth at bedtime., Disp: 30 capsule, Rfl: 3 .  latanoprost (XALATAN) 0.005 % ophthalmic solution, Place 1 drop into both eyes at bedtime. Reported on 10/15/2015, Disp: , Rfl:  .   Magnesium 500 MG CAPS, Take 1 capsule by mouth daily. , Disp: , Rfl:  .  meloxicam (MOBIC) 15 MG tablet, Take 1 tablet (15 mg total) by mouth daily., Disp: 30 tablet, Rfl: 1 .  omeprazole (PRILOSEC) 20 MG capsule, TAKE 1 CAPSULE BY MOUTH ONCE DAILY, Disp: 30 capsule, Rfl: 11 .  oxyCODONE-acetaminophen (PERCOCET) 7.5-325 MG tablet, Take 1 tablet by mouth every 4 (four) hours as needed for severe pain., Disp: 20 tablet, Rfl: 0 .  propranolol ER (INDERAL LA) 160 MG SR capsule, TAKE 1 CAPSULE BY MOUTH ONCE DAILY, Disp: 30 each, Rfl: 12 .  SUMAtriptan (IMITREX) 100 MG tablet, TAKE 1 TABLET BY MOUTH AT ONSET OF HEADACHE MAY TAKE AN ADDITIONAL TABLET IN 2 HOURS IF NEEDED MAX OF 200MG /24HR, Disp: 9 tablet, Rfl: 6 .  topiramate (TOPAMAX) 50 MG tablet, TAKE 1 TABLET BY MOUTH ONCE DAILY., Disp: 30 tablet, Rfl: 5 .  traMADol (ULTRAM) 50 MG tablet, Take 1 tablet (50 mg total) by mouth every 8 (eight) hours as needed., Disp: 30 tablet, Rfl: 0  Review of Systems  Constitutional: Negative for activity change, appetite change, chills, diaphoresis and fatigue.  HENT: Negative.   Respiratory: Negative.   Cardiovascular: Negative.   Musculoskeletal: Positive for arthralgias and myalgias.  Skin: Negative.   Neurological: Negative.  Hematological: Negative.     Social History   Tobacco Use  . Smoking status: Current Every Day Smoker    Packs/day: 0.25    Years: 35.00    Pack years: 8.75    Types: Cigarettes  . Smokeless tobacco: Never Used  Substance Use Topics  . Alcohol use: No      Objective:   BP 122/70 (BP Location: Left Arm, Patient Position: Sitting, Cuff Size: Normal)   Pulse 60   Temp 98.2 F (36.8 C)   Resp 16   Wt 159 lb (72.1 kg)   SpO2 98%   BMI 25.66 kg/m  Vitals:   09/07/18 1133  BP: 122/70  Pulse: 60  Resp: 16  Temp: 98.2 F (36.8 C)  SpO2: 98%  Weight: 159 lb (72.1 kg)     Physical Exam  General appearance: alert, well developed, well nourished, cooperative and  in no distress Head: Normocephalic, without obvious abnormality, atraumatic Respiratory: Respirations even and unlabored, normal respiratory rate Extremities: No gross deformities Skin: Skin color, texture, turgor normal. No rashes seen  Psych: Appropriate mood and affect. Neurologic: Mental status: Alert, oriented to person, place, and time, thought content appropriate.     Assessment & Plan    1. Foot pain, left Mild improvement with introduction of gabapentin. Has follow up scheduled with Dr. Cleda Mccreedy next week, but not thought to be due to intrinsic food disorder, although he does have a Morton's neuroma - gabapentin (NEURONTIN) 300 MG capsule; Take one every morning and two every evening  Dispense: 90 capsule; Refill: 3  2. Lumbosacral radiculopathy  - gabapentin (NEURONTIN) 300 MG capsule; Take one every morning and two every evening  Dispense: 90 capsule; Refill: 3 - DG Lumbar Spine Complete; Future  3. Migraine without aura and without status migrainosus, not intractable  - oxyCODONE-acetaminophen (PERCOCET) 7.5-325 MG tablet; Take 1 tablet by mouth every 4 (four) hours as needed for severe pain.  Dispense: 20 tablet; Refill: 0     The entirety of the information documented in the History of Present Illness, Review of Systems and Physical Exam were personally obtained by me. Portions of this information were initially documented by Wilburt Finlay, CMA and reviewed by me for thoroughness and accuracy.   Lelon Huh, MD  [facility]

## 2018-09-07 NOTE — Patient Instructions (Signed)
.   Please review the attached list of medications and notify my office if there are any errors.   . Please bring all of your medications to every appointment so we can make sure that our medication list is the same as yours.   

## 2018-09-10 ENCOUNTER — Telehealth: Payer: Self-pay

## 2018-09-10 NOTE — Telephone Encounter (Signed)
Pt advised.   Thanks,   -Sammie Denner  

## 2018-09-10 NOTE — Telephone Encounter (Signed)
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 Gabapentin should help with this if we get the dose adjusted. Continue taking one in the morning and two at night for two weeks, then increase to two twice a day. Let me know if not better in 3-4 weeks.

## 2018-09-13 DIAGNOSIS — M65871 Other synovitis and tenosynovitis, right ankle and foot: Secondary | ICD-10-CM | POA: Diagnosis not present

## 2018-09-13 DIAGNOSIS — G5761 Lesion of plantar nerve, right lower limb: Secondary | ICD-10-CM | POA: Diagnosis not present

## 2018-09-13 DIAGNOSIS — M79671 Pain in right foot: Secondary | ICD-10-CM | POA: Diagnosis not present

## 2018-09-26 ENCOUNTER — Telehealth: Payer: Self-pay

## 2018-09-26 DIAGNOSIS — M5417 Radiculopathy, lumbosacral region: Secondary | ICD-10-CM

## 2018-09-26 DIAGNOSIS — M79672 Pain in left foot: Secondary | ICD-10-CM

## 2018-09-26 NOTE — Telephone Encounter (Signed)
Patient states you increased his Gabapentin to 2 BID and the current prescription does not reflect that and he will run out too soon.  Can a new Rx be sent to [facility]  CB# [phone]

## 2018-09-26 NOTE — Addendum Note (Signed)
Addended by: Ashley Royalty E on: 09/26/2018 03:02 PM   Modules accepted: Orders

## 2018-09-27 ENCOUNTER — Other Ambulatory Visit: Payer: Self-pay | Admitting: Family Medicine

## 2018-09-27 MED ORDER — GABAPENTIN 300 MG PO CAPS: 600.0000 mg | ORAL_CAPSULE | Freq: Two times a day (BID) | ORAL | 4 refills | Status: DC

## 2018-09-27 NOTE — Addendum Note (Signed)
Addended by: Birdie Sons on: 09/27/2018 09:15 AM   Modules accepted: Orders

## 2018-10-02 ENCOUNTER — Encounter: Payer: Self-pay | Admitting: Family Medicine

## 2018-10-03 ENCOUNTER — Other Ambulatory Visit: Payer: Self-pay | Admitting: Family Medicine

## 2018-10-03 DIAGNOSIS — G43009 Migraine without aura, not intractable, without status migrainosus: Secondary | ICD-10-CM

## 2018-10-03 MED ORDER — OXYCODONE-ACETAMINOPHEN 7.5-325 MG PO TABS: 1.0000 | ORAL_TABLET | ORAL | 0 refills | Status: DC | PRN

## 2018-10-03 NOTE — Telephone Encounter (Signed)
pt needs a refill on   Oxycodone 7.5 / 325  [facility]  thanks [employer]

## 2018-10-10 ENCOUNTER — Other Ambulatory Visit: Payer: Self-pay | Admitting: Family Medicine

## 2018-10-10 DIAGNOSIS — G43009 Migraine without aura, not intractable, without status migrainosus: Secondary | ICD-10-CM

## 2018-10-16 ENCOUNTER — Other Ambulatory Visit: Payer: Self-pay

## 2018-10-16 ENCOUNTER — Encounter: Payer: Self-pay | Admitting: Family Medicine

## 2018-10-16 ENCOUNTER — Ambulatory Visit: Payer: BC Managed Care – PPO | Admitting: Family Medicine

## 2018-10-16 VITALS — BP 124/72 | HR 61 | Temp 98.0°F | Wt 156.0 lb

## 2018-10-16 DIAGNOSIS — M4135 Thoracogenic scoliosis, thoracolumbar region: Secondary | ICD-10-CM | POA: Diagnosis not present

## 2018-10-16 DIAGNOSIS — G629 Polyneuropathy, unspecified: Secondary | ICD-10-CM

## 2018-10-16 MED ORDER — GABAPENTIN 800 MG PO TABS: 800.0000 mg | ORAL_TABLET | Freq: Three times a day (TID) | ORAL | 3 refills | Status: DC | PRN

## 2018-10-16 NOTE — Progress Notes (Signed)
Patient: Lucas Ramirez Male    DOB: 22-Oct-1955   63 y.o.   MRN: [id number] Visit Date: 10/16/2018  Today's Provider: Lelon Huh, MD   Chief Complaint  Patient presents with  . Foot Pain    (Nerve pain)  Right foot   Subjective:     Foot Pain The problem occurs constantly. The problem has been gradually worsening (Pt states it now involves the ankle and knee). Associated symptoms include arthralgias, joint swelling and myalgias. Pertinent negatives include no neck pain.  Also having intermittent sharp stabbing pain to the outside of his knee and ankle, often into toes on right side only. Has not been having much back pain. Had xr back a month ago showing severe thoraco-lumbo levoscoliosis. Initially seemed to improve when gabapentin was started, but seems to be progressing despite titrating up to 600mg  up to three times a day, although he tried stopping gabepentin pain got much worse.   Allergies  Allergen Reactions  . Bupropion Hcl     seizures     Current Outpatient Medications:  .  amLODipine (NORVASC) 10 MG tablet, TAKE 1 TABLET BY MOUTH ONCE DAILY, Disp: 30 tablet, Rfl: 12 .  clonazePAM (KLONOPIN) 1 MG tablet, TAKE 1/2 TO 1 TABLET BY MOUTH EVERY 6 HOURS AS NEEDED, Disp: 30 tablet, Rfl: 5 .  Cyanocobalamin (RA VITAMIN B-12 TR) 1000 MCG TBCR, Take 1,000 mcg by mouth daily., Disp: , Rfl:  .  gabapentin (NEURONTIN) 300 MG capsule, Take 2 capsules (600 mg total) by mouth 2 (two) times daily., Disp: 120 capsule, Rfl: 4 .  latanoprost (XALATAN) 0.005 % ophthalmic solution, Place 1 drop into both eyes at bedtime. Reported on 10/15/2015, Disp: , Rfl:  .  Magnesium 500 MG CAPS, Take 1 capsule by mouth daily. , Disp: , Rfl:  .  omeprazole (PRILOSEC) 20 MG capsule, TAKE 1 CAPSULE BY MOUTH ONCE DAILY, Disp: 30 capsule, Rfl: 11 .  oxyCODONE-acetaminophen (PERCOCET) 7.5-325 MG tablet, Take 1 tablet by mouth every 4 (four) hours as needed for severe pain., Disp: 20 tablet, Rfl: 0 .   propranolol ER (INDERAL LA) 160 MG SR capsule, TAKE 1 CAPSULE BY MOUTH ONCE DAILY, Disp: 30 each, Rfl: 12 .  SUMAtriptan (IMITREX) 100 MG tablet, TAKE 1 TABLET BY MOUTH AT ONSET OF HEADACHE MAY TAKE AN ADDITIONAL TABLET IN 2 HOURS IF NEEDED MAX OF 200MG /24HR, Disp: 9 tablet, Rfl: 6 .  topiramate (TOPAMAX) 50 MG tablet, TAKE 1 TABLET BY MOUTH ONCE DAILY., Disp: 30 tablet, Rfl: 5 .  cholecalciferol (VITAMIN D) 1000 UNITS tablet, Take 1,000 Units by mouth daily., Disp: , Rfl:  .  meloxicam (MOBIC) 15 MG tablet, Take 1 tablet (15 mg total) by mouth daily. (Patient not taking: Reported on 10/16/2018), Disp: 30 tablet, Rfl: 1 .  traMADol (ULTRAM) 50 MG tablet, Take 1 tablet (50 mg total) by mouth every 8 (eight) hours as needed. (Patient not taking: Reported on 10/16/2018), Disp: 30 tablet, Rfl: 0  Review of Systems  Constitutional: Negative.   Respiratory: Negative.   Musculoskeletal: Positive for arthralgias, gait problem, joint swelling and myalgias. Negative for back pain, neck pain and neck stiffness.    Social History   Tobacco Use  . Smoking status: Current Every Day Smoker    Packs/day: 0.25    Years: 35.00    Pack years: 8.75    Types: Cigarettes  . Smokeless tobacco: Never Used  Substance Use Topics  . Alcohol use: No  Objective:   BP 124/72 (BP Location: Right Arm, Patient Position: Sitting, Cuff Size: Normal)   Pulse 61   Temp 98 F (36.7 C) (Oral)   Wt 156 lb (70.8 kg)   BMI 25.18 kg/m  Vitals:   10/16/18 0905  BP: 124/72  Pulse: 61  Temp: 98 F (36.7 C)  TempSrc: Oral  Weight: 156 lb (70.8 kg)     Physical Exam   General Appearance:    Alert, cooperative, no distress  Eyes:    PERRL, conjunctiva/corneas clear, EOM's intact       Lungs:     Clear to auscultation bilaterally, respirations unlabored  Heart:    Regular rate and rhythm  Neurologic:   Awake, alert, oriented x 3. No tenderness of affected areas of right LE. Mild tenderness over lumbar spine.  Prominent scoliosis.           Assessment & Plan    1. Neuropathy Has responded to gabapentin, but continues to have pain. Has been taking 600mg  up to three times a day, will increase- gabapentin (NEURONTIN) 800 MG tablet; Take 1 tablet (800 mg total) by mouth 3 (three) times daily as needed.  Dispense: 90 tablet; Refill: 3 - Ambulatory referral to Neurology  2. Thoracogenic scoliosis of thoracolumbar region Suspect this may be contributing to neuropathy. Will refer to neurology as above to investigate further. Consider referral to pain clinic.   The entirety of the information documented in the History of Present Illness, Review of Systems and Physical Exam were personally obtained by me. Portions of this information were initially documented by Ashley Royalty, CMA and reviewed by me for thoroughness and accuracy.       Lelon Huh, MD  [facility]

## 2018-10-16 NOTE — Patient Instructions (Signed)
.   Please review the attached list of medications and notify my office if there are any errors.   . Please bring all of your medications to every appointment so we can make sure that our medication list is the same as yours.   

## 2018-10-29 ENCOUNTER — Telehealth: Payer: Self-pay | Admitting: Family Medicine

## 2018-10-29 NOTE — Telephone Encounter (Signed)
Judson Roch, will you look into this? Order for Neurology referreal was placed 10/16/2018. Please update patient on the status of this referral. Thanks.

## 2018-10-29 NOTE — Telephone Encounter (Signed)
Pt called in regards to getting a referral to a neurologist. Pt states that it was discussed in his last visit on 10-16-2018 but he hasn't heard anything. Please advise

## 2018-11-06 DIAGNOSIS — H401131 Primary open-angle glaucoma, bilateral, mild stage: Secondary | ICD-10-CM | POA: Diagnosis not present

## 2018-11-07 ENCOUNTER — Other Ambulatory Visit: Payer: Self-pay | Admitting: Family Medicine

## 2018-11-07 DIAGNOSIS — G43009 Migraine without aura, not intractable, without status migrainosus: Secondary | ICD-10-CM

## 2018-11-07 MED ORDER — OXYCODONE-ACETAMINOPHEN 7.5-325 MG PO TABS: 1.0000 | ORAL_TABLET | ORAL | 0 refills | Status: DC | PRN

## 2018-11-07 NOTE — Telephone Encounter (Signed)
Pt needs a refill on his Oxycodone 7.5-325  [facility]  [place]

## 2018-11-14 DIAGNOSIS — Z131 Encounter for screening for diabetes mellitus: Secondary | ICD-10-CM | POA: Diagnosis not present

## 2018-11-14 DIAGNOSIS — G43709 Chronic migraine without aura, not intractable, without status migrainosus: Secondary | ICD-10-CM | POA: Diagnosis not present

## 2018-11-14 DIAGNOSIS — E538 Deficiency of other specified B group vitamins: Secondary | ICD-10-CM | POA: Diagnosis not present

## 2018-11-14 DIAGNOSIS — E559 Vitamin D deficiency, unspecified: Secondary | ICD-10-CM | POA: Diagnosis not present

## 2018-11-14 DIAGNOSIS — G629 Polyneuropathy, unspecified: Secondary | ICD-10-CM | POA: Diagnosis not present

## 2018-11-14 DIAGNOSIS — M5441 Lumbago with sciatica, right side: Secondary | ICD-10-CM | POA: Diagnosis not present

## 2018-11-14 LAB — HEPATIC FUNCTION PANEL
ALT: 12 (ref 10–40)
AST: 15 (ref 14–40)
Alkaline Phosphatase: 92 (ref 25–125)
Bilirubin, Total: 0.6

## 2018-11-14 LAB — BASIC METABOLIC PANEL
BUN: 14 (ref 4–21)
Creatinine: 1.1 (ref 0.6–1.3)
Glucose: 109
Potassium: 4.4 (ref 3.4–5.3)
Sodium: 137 (ref 137–147)

## 2018-11-14 LAB — CBC AND DIFFERENTIAL
HCT: 45 (ref 41–53)
Hemoglobin: 14.8 (ref 13.5–17.5)
Platelets: 301 (ref 150–399)
WBC: 8.3

## 2018-11-14 LAB — VITAMIN B12: Vitamin B-12: 372

## 2018-11-14 LAB — HEMOGLOBIN A1C: Hemoglobin A1C: 5.3

## 2018-11-14 LAB — VITAMIN D 25 HYDROXY (VIT D DEFICIENCY, FRACTURES): Vit D, 25-Hydroxy: 23.6

## 2018-11-23 ENCOUNTER — Other Ambulatory Visit: Payer: Self-pay | Admitting: Neurology

## 2018-11-23 DIAGNOSIS — G629 Polyneuropathy, unspecified: Secondary | ICD-10-CM

## 2018-11-28 ENCOUNTER — Encounter: Payer: Self-pay | Admitting: Family Medicine

## 2018-11-28 ENCOUNTER — Other Ambulatory Visit: Payer: Self-pay | Admitting: Family Medicine

## 2018-11-28 ENCOUNTER — Telehealth: Payer: Self-pay | Admitting: Family Medicine

## 2018-11-28 DIAGNOSIS — E531 Pyridoxine deficiency: Secondary | ICD-10-CM | POA: Insufficient documentation

## 2018-11-28 DIAGNOSIS — G43009 Migraine without aura, not intractable, without status migrainosus: Secondary | ICD-10-CM

## 2018-11-28 DIAGNOSIS — R7989 Other specified abnormal findings of blood chemistry: Secondary | ICD-10-CM

## 2018-11-28 NOTE — Telephone Encounter (Signed)
Received labs from Dr. Trena Platt office about slightly abnormal TSH. Need to recheck TSH and T4 to see if his thyroid functions are consistently abnormal.

## 2018-11-28 NOTE — Telephone Encounter (Signed)
Pt advised.   Thanks,   -Laura  

## 2018-12-01 ENCOUNTER — Other Ambulatory Visit: Payer: Self-pay | Admitting: Family Medicine

## 2018-12-01 DIAGNOSIS — G43009 Migraine without aura, not intractable, without status migrainosus: Secondary | ICD-10-CM

## 2018-12-04 ENCOUNTER — Other Ambulatory Visit: Payer: Self-pay

## 2018-12-04 ENCOUNTER — Ambulatory Visit
Admission: RE | Admit: 2018-12-04 | Discharge: 2018-12-04 | Disposition: A | Discharge status: Home / Self Care | Payer: BC Managed Care – PPO | Source: Ambulatory Visit | Attending: Neurology | Admitting: Neurology

## 2018-12-04 DIAGNOSIS — G629 Polyneuropathy, unspecified: Secondary | ICD-10-CM | POA: Insufficient documentation

## 2018-12-04 DIAGNOSIS — M48061 Spinal stenosis, lumbar region without neurogenic claudication: Secondary | ICD-10-CM | POA: Diagnosis not present

## 2018-12-05 DIAGNOSIS — R200 Anesthesia of skin: Secondary | ICD-10-CM | POA: Diagnosis not present

## 2018-12-05 DIAGNOSIS — R937 Abnormal findings on diagnostic imaging of other parts of musculoskeletal system: Secondary | ICD-10-CM | POA: Diagnosis not present

## 2018-12-06 DIAGNOSIS — E039 Hypothyroidism, unspecified: Secondary | ICD-10-CM | POA: Diagnosis not present

## 2018-12-06 DIAGNOSIS — R7989 Other specified abnormal findings of blood chemistry: Secondary | ICD-10-CM | POA: Diagnosis not present

## 2018-12-07 LAB — TSH: TSH: 1.03 u[IU]/mL (ref 0.450–4.500)

## 2018-12-07 LAB — T4: T4, Total: 8.5 ug/dL (ref 4.5–12.0)

## 2018-12-10 ENCOUNTER — Telehealth: Payer: Self-pay

## 2018-12-10 NOTE — Telephone Encounter (Signed)
-----   Message from Birdie Sons, MD sent at 12/07/2018  7:52 AM EDT ----- Repeat thyroid functions are normal. Thyroid functions tend to fluctuate, this is normal and no additional follow up is needed.

## 2018-12-10 NOTE — Telephone Encounter (Signed)
Pt advised.   Thanks,   -Shatonya Passon  

## 2018-12-24 ENCOUNTER — Other Ambulatory Visit: Payer: Self-pay

## 2018-12-24 DIAGNOSIS — G43009 Migraine without aura, not intractable, without status migrainosus: Secondary | ICD-10-CM

## 2018-12-24 MED ORDER — OXYCODONE-ACETAMINOPHEN 7.5-325 MG PO TABS: 1.0000 | ORAL_TABLET | ORAL | 0 refills | Status: DC | PRN

## 2019-01-16 ENCOUNTER — Other Ambulatory Visit: Payer: Self-pay | Admitting: Family Medicine

## 2019-01-16 DIAGNOSIS — G629 Polyneuropathy, unspecified: Secondary | ICD-10-CM

## 2019-01-25 ENCOUNTER — Encounter: Payer: Self-pay | Admitting: Family Medicine

## 2019-01-25 ENCOUNTER — Ambulatory Visit: Payer: BC Managed Care – PPO | Admitting: Family Medicine

## 2019-01-25 ENCOUNTER — Other Ambulatory Visit: Payer: Self-pay

## 2019-01-25 VITALS — BP 148/76 | HR 64 | Temp 98.0°F | Resp 16 | Wt 163.0 lb

## 2019-01-25 DIAGNOSIS — G43009 Migraine without aura, not intractable, without status migrainosus: Secondary | ICD-10-CM

## 2019-01-25 DIAGNOSIS — I10 Essential (primary) hypertension: Secondary | ICD-10-CM

## 2019-01-25 DIAGNOSIS — R609 Edema, unspecified: Secondary | ICD-10-CM | POA: Diagnosis not present

## 2019-01-25 DIAGNOSIS — Z23 Encounter for immunization: Secondary | ICD-10-CM | POA: Diagnosis not present

## 2019-01-25 MED ORDER — LISINOPRIL 10 MG PO TABS: 10.0000 mg | ORAL_TABLET | Freq: Every day | ORAL | 1 refills | Status: DC

## 2019-01-25 MED ORDER — OXYCODONE-ACETAMINOPHEN 7.5-325 MG PO TABS: 1.0000 | ORAL_TABLET | ORAL | 0 refills | Status: DC | PRN

## 2019-01-25 NOTE — Patient Instructions (Addendum)
.   Please review the attached list of medications and notify my office if there are any errors.   . Please bring all of your medications to every appointment so we can make sure that our medication list is the same as yours.   

## 2019-01-25 NOTE — Progress Notes (Signed)
Patient: Lucas Ramirez Male    DOB: 02-19-1956   63 y.o.   MRN: [id number] Visit Date: 01/25/2019  Today's Provider: Lelon Huh, MD   Chief Complaint  Patient presents with  . Foot Swelling   Subjective:   HPI Patient comes in today c/o swelling in his right foot. He feels that the amlodipine could be causing his symptoms. He has taken this medication for several years, but in the last few months he has noticed his foot swelling after taking the amlodipine. He has not taken the medication in 5 days, and reports that his foot is much better. Swelling was only in his right foot, no color changes, but was sore and tight before stopping amlodipine. States has happened several times in the past and always resolves after stopping amlodipine.   BP Readings from Last 3 Encounters:  01/25/19 (!) 148/76  10/16/18 124/72  09/07/18 122/70   Wt Readings from Last 3 Encounters:  01/25/19 163 lb (73.9 kg)  10/16/18 156 lb (70.8 kg)  09/07/18 159 lb (72.1 kg)     Allergies  Allergen Reactions  . Bupropion Hcl     seizures     Current Outpatient Medications:  .  cholecalciferol (VITAMIN D) 1000 UNITS tablet, Take 1,000 Units by mouth daily., Disp: , Rfl:  .  clonazePAM (KLONOPIN) 1 MG tablet, TAKE 1/2 TO 1 TABLET BY MOUTH EVERY 6 HOURS AS NEEDED, Disp: 30 tablet, Rfl: 5 .  Cyanocobalamin (RA VITAMIN B-12 TR) 1000 MCG TBCR, Take 1,000 mcg by mouth daily., Disp: , Rfl:  .  gabapentin (NEURONTIN) 800 MG tablet, TAKE 1 TABLET BY MOUTH 3 TIMES DAILY AS NEEDED, Disp: 90 tablet, Rfl: 3 .  latanoprost (XALATAN) 0.005 % ophthalmic solution, Place 1 drop into both eyes at bedtime. Reported on 10/15/2015, Disp: , Rfl:  .  Magnesium 500 MG CAPS, Take 1 capsule by mouth daily. , Disp: , Rfl:  .  omeprazole (PRILOSEC) 20 MG capsule, TAKE 1 CAPSULE BY MOUTH ONCE DAILY, Disp: 30 capsule, Rfl: 11 .  oxyCODONE-acetaminophen (PERCOCET) 7.5-325 MG tablet, Take 1 tablet by mouth every 4 (four) hours as  needed for severe pain., Disp: 20 tablet, Rfl: 0 .  propranolol ER (INDERAL LA) 160 MG SR capsule, TAKE 1 CAPSULE BY MOUTH ONCE DAILY, Disp: 30 each, Rfl: 12 .  SUMAtriptan (IMITREX) 100 MG tablet, TAKE 1 TABLET BY MOUTH AT ONSET OF HEADACHE MAY TAKE AN ADDITIONAL TABLET IN 2 HOURS IF NEEDED MAX OF 200MG /24HR, Disp: 9 tablet, Rfl: 6 .  topiramate (TOPAMAX) 50 MG tablet, TAKE 1 TABLET BY MOUTH ONCE DAILY, Disp: 30 tablet, Rfl: 5 .  amLODipine (NORVASC) 10 MG tablet, TAKE 1 TABLET BY MOUTH ONCE DAILY (Patient not taking: Reported on 01/25/2019), Disp: 30 tablet, Rfl: 12 .  meloxicam (MOBIC) 15 MG tablet, Take 1 tablet (15 mg total) by mouth daily. (Patient not taking: Reported on 10/16/2018), Disp: 30 tablet, Rfl: 1 .  traMADol (ULTRAM) 50 MG tablet, Take 1 tablet (50 mg total) by mouth every 8 (eight) hours as needed. (Patient not taking: Reported on 10/16/2018), Disp: 30 tablet, Rfl: 0  Review of Systems  Constitutional: Negative.   Respiratory: Negative for stridor.   Cardiovascular: Negative for chest pain, palpitations and leg swelling.  Musculoskeletal: Positive for arthralgias. Negative for myalgias.  Neurological: Negative for dizziness and headaches.  Psychiatric/Behavioral: Negative for agitation, self-injury and sleep disturbance. The patient is not nervous/anxious.     Social History  Tobacco Use  . Smoking status: Current Every Day Smoker    Packs/day: 0.25    Years: 35.00    Pack years: 8.75    Types: Cigarettes  . Smokeless tobacco: Never Used  Substance Use Topics  . Alcohol use: No      Objective:   BP (!) 148/76   Pulse 64   Temp 98 F (36.7 C)   Resp 16   Wt 163 lb (73.9 kg)   SpO2 98%   BMI 26.31 kg/m  Vitals:   01/25/19 0852  BP: (!) 148/76  Pulse: 64  Resp: 16  Temp: 98 F (36.7 C)  SpO2: 98%  Weight: 163 lb (73.9 kg)  Body mass index is 26.31 kg/m.   Physical Exam  General appearance: Well developed, well nourished male, cooperative and in no  acute distress Head: Normocephalic, without obvious abnormality, atraumatic Respiratory: Respirations even and unlabored, normal respiratory rate Extremities: All extremities are intact. Trace right pedal edema.  Skin: Skin color, texture, turgor normal. No rashes seen      Assessment & Plan    1. Edema, unspecified type Exacerbated by amlodipine, will keep on hold for now and try ACEI as below.   2. Essential hypertension Reviewed and abstracted recent labs at [facility] in July.  Counseled on potential adverse effects of ACEI. start- lisinopril (ZESTRIL) 10 MG tablet; Take 1 tablet (10 mg total) by mouth daily.  Dispense: 30 tablet; Refill: 1  Return in a month for BP check.   3. Migraine without aura and without status migrainosus, not intractable Refill  - oxyCODONE-acetaminophen (PERCOCET) 7.5-325 MG tablet; Take 1 tablet by mouth every 4 (four) hours as needed for severe pain.  Dispense: 20 tablet; Refill: 0  4. Need for influenza vaccination  - Flu Vaccine QUAD 6+ mos PF IM (Fluarix Quad PF)    The entirety of the information documented in the History of Present Illness, Review of Systems and Physical Exam were personally obtained by me. Portions of this information were initially documented by Wilburt Finlay, CMA and reviewed by me for thoroughness and accuracy.      Lelon Huh, MD  [facility]

## 2019-01-30 DIAGNOSIS — M48062 Spinal stenosis, lumbar region with neurogenic claudication: Secondary | ICD-10-CM | POA: Diagnosis not present

## 2019-01-30 DIAGNOSIS — M5416 Radiculopathy, lumbar region: Secondary | ICD-10-CM | POA: Diagnosis not present

## 2019-01-30 DIAGNOSIS — M5136 Other intervertebral disc degeneration, lumbar region: Secondary | ICD-10-CM | POA: Diagnosis not present

## 2019-02-21 NOTE — Progress Notes (Signed)
Patient: Lucas Ramirez Male    DOB: 1955-07-28   63 y.o.   MRN: [id number] Visit Date: 02/22/2019  Today's Provider: Lelon Huh, MD   Chief Complaint  Patient presents with  . Hypertension   Subjective:     HPI  Hypertension, follow-up:  BP Readings from Last 3 Encounters:  01/25/19 (!) 148/76  10/16/18 124/72  09/07/18 122/70    He was last seen for hypertension 4 weeks ago.  BP at that visit was 148/76. Management since that visit includes starting Lisinopril 10mg  daily. Patient was advised to keep Amlodipine 10mg  on hold due to edema He reports good compliance with treatment. He is not having side effects.  He is not exercising. He is adherent to low salt diet.   Outside blood pressures are not being checked at home. He is experiencing none.  Patient denies chest pain, chest pressure/discomfort, claudication, dyspnea, exertional chest pressure/discomfort, fatigue, irregular heart beat, lower extremity edema, near-syncope, orthopnea, palpitations, paroxysmal nocturnal dyspnea, syncope and tachypnea.   Cardiovascular risk factors include advanced age (older than 60 for men, 31 for women), hypertension and male gender.  Use of agents associated with hypertension: none.     Weight trend: stable Wt Readings from Last 3 Encounters:  01/25/19 163 lb (73.9 kg)  10/16/18 156 lb (70.8 kg)  09/07/18 159 lb (72.1 kg)   ------------------------------------------------------------------------  He states he continues to have neuropathic pain in his feet which he describes as intermittent sharp shooting pain across the tops of his feet. Gabapentin has not helped at all. oxycodone/apap works well. He reports Dr. Manuella Ghazi had also started him on duloxetine which has not helped either.   He is also due for follow up lipids and vitamin d. Is doing well on current meds with no adverse effects.   Allergies  Allergen Reactions  . Bupropion Hcl     seizures     Current  Outpatient Medications:  .  cholecalciferol (VITAMIN D) 1000 UNITS tablet, Take 1,000 Units by mouth daily., Disp: , Rfl:  .  clonazePAM (KLONOPIN) 1 MG tablet, TAKE 1/2 TO 1 TABLET BY MOUTH EVERY 6 HOURS AS NEEDED, Disp: 30 tablet, Rfl: 5 .  Cyanocobalamin (RA VITAMIN B-12 TR) 1000 MCG TBCR, Take 1,000 mcg by mouth daily., Disp: , Rfl:  .  DULoxetine (CYMBALTA) 20 MG capsule, Take 20 mg by mouth daily., Disp: , Rfl:  .  gabapentin (NEURONTIN) 800 MG tablet, TAKE 1 TABLET BY MOUTH 3 TIMES DAILY AS NEEDED, Disp: 90 tablet, Rfl: 3 .  latanoprost (XALATAN) 0.005 % ophthalmic solution, Place 1 drop into both eyes at bedtime. Reported on 10/15/2015, Disp: , Rfl:  .  lisinopril (ZESTRIL) 10 MG tablet, Take 1 tablet (10 mg total) by mouth daily., Disp: 30 tablet, Rfl: 1 .  Magnesium 500 MG CAPS, Take 1 capsule by mouth daily. , Disp: , Rfl:  .  omeprazole (PRILOSEC) 20 MG capsule, TAKE 1 CAPSULE BY MOUTH ONCE DAILY, Disp: 30 capsule, Rfl: 11 .  oxyCODONE-acetaminophen (PERCOCET) 7.5-325 MG tablet, Take 1 tablet by mouth every 4 (four) hours as needed for severe pain., Disp: 20 tablet, Rfl: 0 .  propranolol ER (INDERAL LA) 160 MG SR capsule, TAKE 1 CAPSULE BY MOUTH ONCE DAILY, Disp: 30 each, Rfl: 12 .  SUMAtriptan (IMITREX) 100 MG tablet, TAKE 1 TABLET BY MOUTH AT ONSET OF HEADACHE MAY TAKE AN ADDITIONAL TABLET IN 2 HOURS IF NEEDED MAX OF 200MG /24HR, Disp: 9 tablet, Rfl: 6 .  topiramate (TOPAMAX) 50 MG tablet, TAKE 1 TABLET BY MOUTH ONCE DAILY, Disp: 30 tablet, Rfl: 5 .  meloxicam (MOBIC) 15 MG tablet, Take 1 tablet (15 mg total) by mouth daily. (Patient not taking: Reported on 10/16/2018), Disp: 30 tablet, Rfl: 1 .  traMADol (ULTRAM) 50 MG tablet, Take 1 tablet (50 mg total) by mouth every 8 (eight) hours as needed. (Patient not taking: Reported on 10/16/2018), Disp: 30 tablet, Rfl: 0  Review of Systems  Constitutional: Negative for appetite change, chills and fever.  Respiratory: Negative for chest  tightness, shortness of breath and wheezing.   Cardiovascular: Negative for chest pain and palpitations.  Gastrointestinal: Negative for abdominal pain, nausea and vomiting.    Social History   Tobacco Use  . Smoking status: Current Every Day Smoker    Packs/day: 0.25    Years: 35.00    Pack years: 8.75    Types: Cigarettes  . Smokeless tobacco: Never Used  Substance Use Topics  . Alcohol use: No      Objective:   BP (!) 160/75 (BP Location: Left Arm, Patient Position: Sitting, Cuff Size: Normal)   Pulse (!) 52   Temp (!) 96.2 F (35.7 C) (Temporal)   Ht 5\' 6"  (1.676 m)   Wt 164 lb 3.2 oz (74.5 kg)   SpO2 99%   BMI 26.50 kg/m   Physical Exam  General appearance: Well developed, well nourished male, cooperative and in no acute distress Head: Normocephalic, without obvious abnormality, atraumatic Respiratory: Respirations even and unlabored, normal respiratory rate Extremities: All extremities are intact.  Skin: Skin color, texture, turgor normal. No rashes seen  Psych: Appropriate mood and affect. Neurologic: Mental status: Alert, oriented to person, place, and time, thought content appropriate.      Assessment & Plan    1. Essential hypertension Systolic is out of control since change from amlodipine to lisinopril. Consider adding hctz after reviewing labs.   - Comprehensive metabolic panel  2. Neuritis May respond to carbamazepine. He is already on several neurological agents. He is going to discuss this further with Dr. Manuella Ghazi. He reports - oxyCODONE-acetaminophen (PERCOCET) 7.5-325 MG tablet; Take 1 tablet by mouth every 4 (four) hours as needed for severe pain.  Dispense: 20 tablet; Refill: 0 has been effective   3. Hyperlipidemia, unspecified hyperlipidemia type  Not currently on medication - Lipid panel  4. Vitamin D deficiency  - VITAMIN D 25 Hydroxy (Vit-D Deficiency, Fractures)  5. . Migraine without aura and without status migrainosus, not  intractable Well controlled on topiramate  6. Prostate cancer screening  - PSA      Lelon Huh, MD  [facility]

## 2019-02-22 ENCOUNTER — Encounter: Payer: Self-pay | Admitting: Family Medicine

## 2019-02-22 ENCOUNTER — Other Ambulatory Visit: Payer: Self-pay | Admitting: Family Medicine

## 2019-02-22 ENCOUNTER — Ambulatory Visit: Payer: BC Managed Care – PPO | Admitting: Family Medicine

## 2019-02-22 ENCOUNTER — Other Ambulatory Visit: Payer: Self-pay

## 2019-02-22 VITALS — BP 160/75 | HR 52 | Temp 96.2°F | Ht 66.0 in | Wt 164.2 lb

## 2019-02-22 DIAGNOSIS — G43009 Migraine without aura, not intractable, without status migrainosus: Secondary | ICD-10-CM

## 2019-02-22 DIAGNOSIS — I10 Essential (primary) hypertension: Secondary | ICD-10-CM

## 2019-02-22 DIAGNOSIS — E785 Hyperlipidemia, unspecified: Secondary | ICD-10-CM

## 2019-02-22 DIAGNOSIS — Z125 Encounter for screening for malignant neoplasm of prostate: Secondary | ICD-10-CM | POA: Diagnosis not present

## 2019-02-22 DIAGNOSIS — M792 Neuralgia and neuritis, unspecified: Secondary | ICD-10-CM | POA: Diagnosis not present

## 2019-02-22 DIAGNOSIS — E559 Vitamin D deficiency, unspecified: Secondary | ICD-10-CM

## 2019-02-22 DIAGNOSIS — G629 Polyneuropathy, unspecified: Secondary | ICD-10-CM

## 2019-02-22 MED ORDER — OXYCODONE-ACETAMINOPHEN 7.5-325 MG PO TABS: 1.0000 | ORAL_TABLET | ORAL | 0 refills | Status: DC | PRN

## 2019-02-22 NOTE — Patient Instructions (Signed)
.   Please review the attached list of medications and notify my office if there are any errors.   . Please bring all of your medications to every appointment so we can make sure that our medication list is the same as yours.   . It is especially important to get the annual flu vaccine this year. If you haven't had it already, please go to your pharmacy or call the office as soon as possible to schedule you flu shot.  

## 2019-02-23 LAB — COMPREHENSIVE METABOLIC PANEL
ALT: 7 IU/L (ref 0–44)
AST: 10 IU/L (ref 0–40)
Albumin/Globulin Ratio: 2.3 — ABNORMAL HIGH (ref 1.2–2.2)
Albumin: 4.4 g/dL (ref 3.8–4.8)
Alkaline Phosphatase: 84 IU/L (ref 39–117)
BUN/Creatinine Ratio: 12 (ref 10–24)
BUN: 13 mg/dL (ref 8–27)
Bilirubin Total: 0.2 mg/dL (ref 0.0–1.2)
CO2: 24 mmol/L (ref 20–29)
Calcium: 7.9 mg/dL — ABNORMAL LOW (ref 8.6–10.2)
Chloride: 103 mmol/L (ref 96–106)
Creatinine, Ser: 1.1 mg/dL (ref 0.76–1.27)
GFR calc Af Amer: 82 mL/min/{1.73_m2} (ref >59)
GFR calc non Af Amer: 71 mL/min/{1.73_m2} (ref >59)
Globulin, Total: 1.9 g/dL (ref 1.5–4.5)
Glucose: 104 mg/dL — ABNORMAL HIGH (ref 65–99)
Potassium: 5.2 mmol/L (ref 3.5–5.2)
Sodium: 140 mmol/L (ref 134–144)
Total Protein: 6.3 g/dL (ref 6.0–8.5)

## 2019-02-23 LAB — PSA: Prostate Specific Ag, Serum: 0.4 ng/mL (ref 0.0–4.0)

## 2019-02-23 LAB — LIPID PANEL
Chol/HDL Ratio: 7.7 ratio — ABNORMAL HIGH (ref 0.0–5.0)
Cholesterol, Total: 247 mg/dL — ABNORMAL HIGH (ref 100–199)
HDL: 32 mg/dL — ABNORMAL LOW (ref >39)
LDL Chol Calc (NIH): 175 mg/dL — ABNORMAL HIGH (ref 0–99)
Triglycerides: 212 mg/dL — ABNORMAL HIGH (ref 0–149)
VLDL Cholesterol Cal: 40 mg/dL (ref 5–40)

## 2019-02-23 LAB — VITAMIN D 25 HYDROXY (VIT D DEFICIENCY, FRACTURES): Vit D, 25-Hydroxy: 28.1 ng/mL — ABNORMAL LOW (ref 30.0–100.0)

## 2019-02-25 ENCOUNTER — Telehealth: Payer: Self-pay

## 2019-02-25 MED ORDER — ROSUVASTATIN CALCIUM 10 MG PO TABS: 10.0000 mg | ORAL_TABLET | Freq: Every day | ORAL | 1 refills | Status: DC

## 2019-02-25 MED ORDER — HYDROCHLOROTHIAZIDE 12.5 MG PO CAPS: 12.5000 mg | ORAL_CAPSULE | Freq: Every day | ORAL | 1 refills | Status: DC

## 2019-02-25 NOTE — Telephone Encounter (Signed)
-----   Message from Birdie Sons, MD sent at 02/23/2019  6:06 PM EDT ----- Vitamin d levels are better, continue current dose of vitamin D Cholesterol is much too high at 247, needs to be under 180. Need to start rosuvastatin 10mg  once daily, can send in prescription #90, rf x 1.  Need to start hctz 12.5mg  once daily, #90, rf x 1 in addition to lisinopril Schedule follow up for lipids and HTN in 2 months.

## 2019-02-25 NOTE — Telephone Encounter (Signed)
Patient advised. RX sent to [facility] drug.Follow up scheduled.

## 2019-03-05 DIAGNOSIS — M5136 Other intervertebral disc degeneration, lumbar region: Secondary | ICD-10-CM | POA: Diagnosis not present

## 2019-03-05 DIAGNOSIS — M48062 Spinal stenosis, lumbar region with neurogenic claudication: Secondary | ICD-10-CM | POA: Diagnosis not present

## 2019-03-05 DIAGNOSIS — M5416 Radiculopathy, lumbar region: Secondary | ICD-10-CM | POA: Diagnosis not present

## 2019-03-18 DIAGNOSIS — M5416 Radiculopathy, lumbar region: Secondary | ICD-10-CM | POA: Diagnosis not present

## 2019-03-18 DIAGNOSIS — G43709 Chronic migraine without aura, not intractable, without status migrainosus: Secondary | ICD-10-CM | POA: Diagnosis not present

## 2019-03-18 DIAGNOSIS — M48061 Spinal stenosis, lumbar region without neurogenic claudication: Secondary | ICD-10-CM | POA: Diagnosis not present

## 2019-03-18 DIAGNOSIS — G629 Polyneuropathy, unspecified: Secondary | ICD-10-CM | POA: Diagnosis not present

## 2019-03-19 ENCOUNTER — Other Ambulatory Visit: Payer: Self-pay | Admitting: Family Medicine

## 2019-03-19 DIAGNOSIS — M792 Neuralgia and neuritis, unspecified: Secondary | ICD-10-CM

## 2019-03-19 DIAGNOSIS — G629 Polyneuropathy, unspecified: Secondary | ICD-10-CM

## 2019-03-19 MED ORDER — OXYCODONE-ACETAMINOPHEN 7.5-325 MG PO TABS: 1.0000 | ORAL_TABLET | ORAL | 0 refills | Status: DC | PRN

## 2019-03-19 NOTE — Telephone Encounter (Signed)
Requested medication (s) are due for refill today: yes  Requested medication (s) are on the active medication list: yes  Last refill:  02/22/2019  Future visit scheduled: yes  Notes to clinic:  Not delegated    Requested Prescriptions  Pending Prescriptions Disp Refills   oxyCODONE-acetaminophen (PERCOCET) 7.5-325 MG tablet 20 tablet 0    Sig: Take 1 tablet by mouth every 4 (four) hours as needed for severe pain.     Not Delegated - Analgesics:  Opioid Agonist Combinations Failed - 03/19/2019  8:44 AM      Failed - This refill cannot be delegated      Failed - Urine Drug Screen completed in last 360 days.      Passed - Valid encounter within last 6 months    Recent Outpatient Visits          3 weeks ago Essential hypertension   [facility] Birdie Sons, MD   1 month ago Edema, unspecified type   [facility] Birdie Sons, MD   5 months ago Neuropathy   [facility] Birdie Sons, MD   6 months ago Foot pain, left   [facility] Birdie Sons, MD   10 months ago Foot pain, left   [facility] Birdie Sons, MD      Future Appointments            In 1 month Fisher, Kirstie Peri, MD [facility], [place]

## 2019-03-19 NOTE — Telephone Encounter (Signed)
Medication Refill - Medication:  oxyCODONE-acetaminophen (PERCOCET) 7.5-325 MG tablet  Has the patient contacted their pharmacy? Yes advised to call office.   Preferred Pharmacy (with phone number or street name):  [facility], [place]. [phone] (Phone) [phone] (Fax)    Agent: Please be advised that RX refills may take up to 3 business days. We ask that you follow-up with your pharmacy.

## 2019-03-26 ENCOUNTER — Ambulatory Visit
Admission: RE | Admit: 2019-03-26 | Discharge: 2019-03-26 | Disposition: A | Discharge status: Home / Self Care | Payer: BC Managed Care – PPO | Source: Ambulatory Visit | Attending: Neurosurgery | Admitting: Neurosurgery

## 2019-03-26 ENCOUNTER — Other Ambulatory Visit: Payer: Self-pay | Admitting: Neurosurgery

## 2019-03-26 DIAGNOSIS — M4185 Other forms of scoliosis, thoracolumbar region: Secondary | ICD-10-CM

## 2019-03-26 DIAGNOSIS — M5416 Radiculopathy, lumbar region: Secondary | ICD-10-CM | POA: Diagnosis not present

## 2019-03-31 DIAGNOSIS — R591 Generalized enlarged lymph nodes: Secondary | ICD-10-CM | POA: Insufficient documentation

## 2019-03-31 NOTE — Progress Notes (Signed)
[place]  [phone] [phone]) [phone]  ID: SKYLEN DEBORD OB: 02/22/1956  MR#: [id number]  [id number]  Patient Care Team: Birdie Sons, MD as PCP - General (Family Medicine)  CHIEF COMPLAINT: Lymphadenopathy  INTERVAL HISTORY: Patient is a 63 year old male with chronic back pain from scoliosis with multiple back surgeries.  In August 2020, he was noted incidentally to have an enlarged 2.3 cm para-aortic lymph node.  He was referred for further evaluation and additional imaging.  He currently feels at his baseline.  He has no new neurologic complaints.  He denies any recent fevers or illnesses.  He denies any night sweats.  He has a good appetite and denies weight loss.  He has no chest pain, shortness of breath, cough, or hemoptysis.  He denies any nausea, vomiting, constipation, or diarrhea.  He has no urinary complaints.  Patient feels essentially at his baseline offers no further specific complaints today.  REVIEW OF SYSTEMS:   Review of Systems  Constitutional: Negative.  Negative for diaphoresis, fever, malaise/fatigue and weight loss.  Respiratory: Negative.  Negative for cough, hemoptysis and shortness of breath.   Cardiovascular: Negative.  Negative for chest pain and leg swelling.  Gastrointestinal: Negative.  Negative for abdominal pain.  Genitourinary: Negative.  Negative for dysuria.  Musculoskeletal: Positive for back pain.  Skin: Negative.  Negative for rash.  Neurological: Negative.  Negative for dizziness, focal weakness, weakness and headaches.  Psychiatric/Behavioral: Negative.  The patient is not nervous/anxious.     As per HPI. Otherwise, a complete review of systems is negative.  PAST MEDICAL HISTORY: Past Medical History:  Diagnosis Date  . CAP (community acquired pneumonia) 06/28/2015  . COPD (chronic obstructive pulmonary disease) ([place])   . Hypertension   . Migraine   . Shingles 2019   dx by dermatologist by  patient report    PAST SURGICAL HISTORY: Past Surgical History:  Procedure Laterality Date  . [place]   correcting Scoliosils per pt  . SKIN LESION EXCISION  06/06/2013   facial left cheek. Dr. Evorn Gong  . UPPER GI ENDOSCOPY  07/11/2008   [facility]. Dr. Donnella Sham. Impression. -LA Grade C reflux esophagitis. 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 Down syndrome Sister   . CAD Neg Hx   . Heart disease Neg Hx   . Colon cancer Neg Hx   . Prostate cancer Neg Hx     ADVANCED DIRECTIVES (Y/N):  N  HEALTH MAINTENANCE: Social History   Tobacco Use  . Smoking status: Current Every Day Smoker    Packs/day: 0.25    Years: 35.00    Pack years: 8.75    Types: Cigarettes  . Smokeless tobacco: Never Used  Substance Use Topics  . Alcohol use: No  . Drug use: No     Colonoscopy:  PAP:  Bone density:  Lipid panel:  Allergies  Allergen Reactions  . Bupropion Hcl     seizures    Current Outpatient Medications  Medication Sig Dispense Refill  . cholecalciferol (VITAMIN D) 1000 UNITS tablet Take 1,000 Units by mouth daily.    . clonazePAM (KLONOPIN) 1 MG tablet TAKE 1/2 TO 1 TABLET BY MOUTH EVERY 6 HOURS AS NEEDED 30 tablet 5  . Cyanocobalamin (RA VITAMIN B-12 TR) 1000 MCG TBCR Take 1,000 mcg by mouth daily.    . DULoxetine (CYMBALTA) 20 MG capsule Take 20 mg by mouth daily.    Marland Kitchen  gabapentin (NEURONTIN) 800 MG tablet TAKE 1 TABLET BY MOUTH 3 TIMES DAILY AS NEEDED 90 tablet 3  . hydrochlorothiazide (MICROZIDE) 12.5 MG capsule Take 1 capsule (12.5 mg total) by mouth daily. 90 capsule 1  . latanoprost (XALATAN) 0.005 % ophthalmic solution Place 1 drop into both eyes at bedtime. Reported on 10/15/2015    . lisinopril (ZESTRIL) 10 MG tablet TAKE 1 TABLET BY MOUTH ONCE DAILY 30 tablet 12  . omeprazole (PRILOSEC) 20 MG capsule TAKE 1 CAPSULE BY MOUTH ONCE DAILY 30 capsule 11  . propranolol  ER (INDERAL LA) 160 MG SR capsule TAKE 1 CAPSULE BY MOUTH ONCE DAILY 30 each 12  . SUMAtriptan (IMITREX) 100 MG tablet TAKE 1 TABLET BY MOUTH AT ONSET OF HEADACHE MAY TAKE AN ADDITIONAL TABLET IN 2 HOURS IF NEEDED MAX OF 200MG /24HR 9 tablet 6  . topiramate (TOPAMAX) 50 MG tablet TAKE 1 TABLET BY MOUTH ONCE DAILY 30 tablet 5  . Magnesium 500 MG CAPS Take 1 capsule by mouth daily.     . meloxicam (MOBIC) 15 MG tablet Take 1 tablet (15 mg total) by mouth daily. (Patient not taking: Reported on 10/16/2018) 30 tablet 1  . oxyCODONE-acetaminophen (PERCOCET) 7.5-325 MG tablet Take 1 tablet by mouth every 4 (four) hours as needed for severe pain. 20 tablet 0  . rosuvastatin (CRESTOR) 10 MG tablet Take 1 tablet (10 mg total) by mouth daily. (Patient not taking: Reported on 04/01/2019) 90 tablet 1  . traMADol (ULTRAM) 50 MG tablet Take 1 tablet (50 mg total) by mouth every 8 (eight) hours as needed. (Patient not taking: Reported on 10/16/2018) 30 tablet 0   No current facility-administered medications for this visit.     OBJECTIVE: Vitals:   04/01/19 1504  BP: (!) 156/71  Resp: 18  Temp: (!) 96.8 F (36 C)  SpO2: 100%     Body mass index is 26.42 kg/m.    ECOG FS:0 - Asymptomatic  General: Well-developed, well-nourished, no acute distress. Eyes: Pink conjunctiva, anicteric sclera. HEENT: Normocephalic, moist mucous membranes, clear oropharnyx. Lungs: Clear to auscultation bilaterally. Heart: Regular rate and rhythm. No rubs, murmurs, or gallops. Abdomen: Soft, nontender, nondistended. No organomegaly noted, normoactive bowel sounds. Musculoskeletal: No edema, cyanosis, or clubbing. Neuro: Alert, answering all questions appropriately. Cranial nerves grossly intact. Skin: No rashes or petechiae noted. Psych: Normal affect. Lymphatics: No cervical, calvicular, axillary or inguinal LAD.   LAB RESULTS:  Lab Results  Component Value Date   NA 140 02/22/2019   K 5.2 02/22/2019   CL 103  02/22/2019   CO2 24 02/22/2019   GLUCOSE 104 (H) 02/22/2019   BUN 13 02/22/2019   CREATININE 1.10 02/22/2019   CALCIUM 7.9 (L) 02/22/2019   PROT 6.3 02/22/2019   ALBUMIN 4.4 02/22/2019   AST 10 02/22/2019   ALT 7 02/22/2019   ALKPHOS 84 02/22/2019   BILITOT 0.2 02/22/2019   GFRNONAA 71 02/22/2019   GFRAA 82 02/22/2019    Lab Results  Component Value Date   WBC 7.8 04/01/2019   NEUTROABS 4.9 04/01/2019   HGB 14.7 04/01/2019   HCT 45.3 04/01/2019   MCV 90.8 04/01/2019   PLT 261 04/01/2019     STUDIES: Dg Scoliosis Eval Complete Spine 2 Or 3 Views  Result Date: 03/26/2019 CLINICAL DATA:  Scoliosis. EXAM: DG SCOLIOSIS EVAL COMPLETE SPINE 2-3V COMPARISON:  Plain film of the lumbar spine dated 09/07/2018. 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 Commensurate dextroscoliosis within the midthoracic spine measures approximately 60 degrees. No acute appearing osseous abnormality. Chronic appearing pleural thickening and/or small pleural effusion at the RIGHT lung base. Visualized bowel gas pattern is nonobstructive. IMPRESSION: 1. 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 2. Commensurate dextroscoliosis within the midthoracic spine measures approximately 60 degrees. Electronically Signed   By: Franki Cabot M.D.   On: 03/26/2019 13:50    ASSESSMENT: Lymphadenopathy.  PLAN:   1. Lymphadenopathy: MRI from December 04, 2018 reviewed independently with 2.3 cm para-aortic lymph node.  Will order CT scan of the chest, abdomen, pelvis for further evaluation and to assess for additional lymphadenopathy.  If this is in fact lymphoma, it is likely indolent.  Previous SPEP was reported as negative.  Peripheral blood flow cytometry is pending at time of dictation.  Patient will have video assisted telemedicine visit in approximately 3  weeks to discuss the results and any additional diagnostic planning necessary.  I spent a total of 60 minutes face-to-face with the patient of which greater than 50% of the visit was spent in counseling and coordination of care as detailed above.   Patient expressed understanding and was in agreement with this plan. He also understands that He can call clinic at any time with any questions, concerns, or complaints.   Cancer Staging No matching staging information was found for the patient.  Lloyd Huger, MD   04/02/2019 7:06 AM

## 2019-04-01 ENCOUNTER — Other Ambulatory Visit: Payer: Self-pay

## 2019-04-01 ENCOUNTER — Inpatient Hospital Stay: Payer: BC Managed Care – PPO

## 2019-04-01 ENCOUNTER — Inpatient Hospital Stay: Payer: BC Managed Care – PPO | Attending: Oncology | Admitting: Oncology

## 2019-04-01 ENCOUNTER — Encounter: Payer: Self-pay | Admitting: Oncology

## 2019-04-01 DIAGNOSIS — J449 Chronic obstructive pulmonary disease, unspecified: Secondary | ICD-10-CM | POA: Insufficient documentation

## 2019-04-01 DIAGNOSIS — M419 Scoliosis, unspecified: Secondary | ICD-10-CM | POA: Diagnosis not present

## 2019-04-01 DIAGNOSIS — R591 Generalized enlarged lymph nodes: Secondary | ICD-10-CM

## 2019-04-01 DIAGNOSIS — I10 Essential (primary) hypertension: Secondary | ICD-10-CM | POA: Diagnosis not present

## 2019-04-01 DIAGNOSIS — M549 Dorsalgia, unspecified: Secondary | ICD-10-CM | POA: Diagnosis not present

## 2019-04-01 DIAGNOSIS — F17210 Nicotine dependence, cigarettes, uncomplicated: Secondary | ICD-10-CM | POA: Diagnosis not present

## 2019-04-01 DIAGNOSIS — G8929 Other chronic pain: Secondary | ICD-10-CM | POA: Diagnosis not present

## 2019-04-01 LAB — CBC WITH DIFFERENTIAL/PLATELET
Abs Immature Granulocytes: 0.02 10*3/uL (ref 0.00–0.07)
Basophils Absolute: 0 10*3/uL (ref 0.0–0.1)
Basophils Relative: 1 %
Eosinophils Absolute: 0.1 10*3/uL (ref 0.0–0.5)
Eosinophils Relative: 2 %
HCT: 45.3 % (ref 39.0–52.0)
Hemoglobin: 14.7 g/dL (ref 13.0–17.0)
Immature Granulocytes: 0 %
Lymphocytes Relative: 25 %
Lymphs Abs: 1.9 10*3/uL (ref 0.7–4.0)
MCH: 29.5 pg (ref 26.0–34.0)
MCHC: 32.5 g/dL (ref 30.0–36.0)
MCV: 90.8 fL (ref 80.0–100.0)
Monocytes Absolute: 0.7 10*3/uL (ref 0.1–1.0)
Monocytes Relative: 10 %
Neutro Abs: 4.9 10*3/uL (ref 1.7–7.7)
Neutrophils Relative %: 62 %
Platelets: 261 10*3/uL (ref 150–400)
RBC: 4.99 MIL/uL (ref 4.22–5.81)
RDW: 13.5 % (ref 11.5–15.5)
WBC: 7.8 10*3/uL (ref 4.0–10.5)
nRBC: 0 % (ref 0.0–0.2)

## 2019-04-01 NOTE — Progress Notes (Signed)
New Patient consult for abnormal MRI, abnormal area seen.

## 2019-04-02 DIAGNOSIS — M5136 Other intervertebral disc degeneration, lumbar region: Secondary | ICD-10-CM | POA: Diagnosis not present

## 2019-04-02 DIAGNOSIS — M5416 Radiculopathy, lumbar region: Secondary | ICD-10-CM | POA: Diagnosis not present

## 2019-04-02 DIAGNOSIS — M48062 Spinal stenosis, lumbar region with neurogenic claudication: Secondary | ICD-10-CM | POA: Diagnosis not present

## 2019-04-03 DIAGNOSIS — M5416 Radiculopathy, lumbar region: Secondary | ICD-10-CM | POA: Diagnosis not present

## 2019-04-03 DIAGNOSIS — M6281 Muscle weakness (generalized): Secondary | ICD-10-CM | POA: Diagnosis not present

## 2019-04-04 LAB — COMP PANEL: LEUKEMIA/LYMPHOMA

## 2019-04-10 DIAGNOSIS — M5416 Radiculopathy, lumbar region: Secondary | ICD-10-CM | POA: Diagnosis not present

## 2019-04-15 ENCOUNTER — Other Ambulatory Visit: Payer: Self-pay | Admitting: Family Medicine

## 2019-04-15 DIAGNOSIS — M792 Neuralgia and neuritis, unspecified: Secondary | ICD-10-CM

## 2019-04-15 DIAGNOSIS — G629 Polyneuropathy, unspecified: Secondary | ICD-10-CM

## 2019-04-15 MED ORDER — OXYCODONE-ACETAMINOPHEN 7.5-325 MG PO TABS: 1.0000 | ORAL_TABLET | ORAL | 0 refills | Status: DC | PRN

## 2019-04-15 NOTE — Telephone Encounter (Signed)
Copied from [place] [phone]. Topic: Quick Communication - Rx Refill/Question >> Apr 15, 2019  9:18 AM Rainey Pines A wrote: Medication: oxyCODONE-acetaminophen (PERCOCET) 7.5-325 MG tablet   Has the patient contacted their pharmacy?Yes (Agent: If no, request that the patient contact the pharmacy for the refill.) (Agent: If yes, when and what did the pharmacy advise?)Contact PCP  Preferred Pharmacy (with phone number or street name): [facility], [place].  Phone:  [phone] Fax:  [phone]     Agent: Please be advised that RX refills may take up to 3 business days. We ask that you follow-up with your pharmacy.

## 2019-04-16 ENCOUNTER — Other Ambulatory Visit: Payer: Self-pay

## 2019-04-16 ENCOUNTER — Ambulatory Visit
Admission: RE | Admit: 2019-04-16 | Discharge: 2019-04-16 | Disposition: A | Discharge status: Home / Self Care | Payer: BC Managed Care – PPO | Source: Ambulatory Visit | Attending: Oncology | Admitting: Oncology

## 2019-04-16 ENCOUNTER — Telehealth: Payer: BC Managed Care – PPO | Admitting: Oncology

## 2019-04-16 DIAGNOSIS — K5730 Diverticulosis of large intestine without perforation or abscess without bleeding: Secondary | ICD-10-CM | POA: Diagnosis not present

## 2019-04-16 DIAGNOSIS — R591 Generalized enlarged lymph nodes: Secondary | ICD-10-CM | POA: Diagnosis not present

## 2019-04-16 DIAGNOSIS — J984 Other disorders of lung: Secondary | ICD-10-CM | POA: Diagnosis not present

## 2019-04-16 DIAGNOSIS — J438 Other emphysema: Secondary | ICD-10-CM | POA: Diagnosis not present

## 2019-04-16 DIAGNOSIS — I700 Atherosclerosis of aorta: Secondary | ICD-10-CM | POA: Diagnosis not present

## 2019-04-16 DIAGNOSIS — M4185 Other forms of scoliosis, thoracolumbar region: Secondary | ICD-10-CM | POA: Diagnosis not present

## 2019-04-16 LAB — POCT I-STAT CREATININE: Creatinine, Ser: 1.5 mg/dL — ABNORMAL HIGH (ref 0.61–1.24)

## 2019-04-16 MED ORDER — IOHEXOL 300 MG/ML  SOLN
100.0000 mL | Freq: Once | INTRAMUSCULAR | Status: AC | PRN
  Administered 2019-04-16: 09:00:00 100 mL via INTRAVENOUS

## 2019-04-17 DIAGNOSIS — M6281 Muscle weakness (generalized): Secondary | ICD-10-CM | POA: Diagnosis not present

## 2019-04-17 DIAGNOSIS — M5416 Radiculopathy, lumbar region: Secondary | ICD-10-CM | POA: Diagnosis not present

## 2019-04-17 NOTE — Progress Notes (Signed)
[place]  [phone] Fax:(336) [phone]  ID: Lucas Ramirez OB: 04/30/1955  MR#: [id number]  [id number]  Patient Care Team: Birdie Sons, MD as PCP - General (Family Medicine)  I connected with Lucas Ramirez on 04/23/19 at  2:00 PM EST by video enabled telemedicine visit and verified that I am speaking with the correct person using two identifiers.   I discussed the limitations, risks, security and privacy concerns of performing an evaluation and management service by telemedicine and the availability of in-person appointments. I also discussed with the patient that there may be a patient responsible charge related to this service. The patient expressed understanding and agreed to proceed.   Other persons participating in the visit and their role in the encounter: Patient, MD.  Patient's location: [place]. Provider's location: Clinic.  CHIEF COMPLAINT: Lymphadenopathy  INTERVAL HISTORY: Patient agreed to video enabled telemedicine visit for further evaluation and discussion of his imaging results.  He continues to feel well and is asymptomatic.  He has no new neurologic complaints.  He denies any recent fevers or illnesses.  He denies any night sweats.  He has a good appetite and denies weight loss.  He has no chest pain, shortness of breath, cough, or hemoptysis.  He denies any nausea, vomiting, constipation, or diarrhea.  He has no urinary complaints.  Patient offers no specific complaints today.  REVIEW OF SYSTEMS:   Review of Systems  Constitutional: Negative.  Negative for diaphoresis, fever, malaise/fatigue and weight loss.  Respiratory: Negative.  Negative for cough, hemoptysis and shortness of breath.   Cardiovascular: Negative.  Negative for chest pain and leg swelling.  Gastrointestinal: Negative.  Negative for abdominal pain.  Genitourinary: Negative.  Negative for dysuria.  Musculoskeletal: Positive for back pain.  Skin:  Negative.  Negative for rash.  Neurological: Negative.  Negative for dizziness, focal weakness, weakness and headaches.  Psychiatric/Behavioral: Negative.  The patient is not nervous/anxious.     As per HPI. Otherwise, a complete review of systems is negative.  PAST MEDICAL HISTORY: Past Medical History:  Diagnosis Date  . CAP (community acquired pneumonia) 06/28/2015  . COPD (chronic obstructive pulmonary disease) ([place])   . Hypertension   . Migraine   . Shingles 2019   dx by dermatologist by patient report    PAST SURGICAL HISTORY: Past Surgical History:  Procedure Laterality Date  . [place]   correcting Scoliosils per pt  . SKIN LESION EXCISION  06/06/2013   facial left cheek. Dr. Evorn Gong  . UPPER GI ENDOSCOPY  07/11/2008   [facility]. Dr. Donnella Sham. Impression. -LA Grade C reflux esophagitis. 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 Down syndrome Sister   . CAD Neg Hx   . Heart disease Neg Hx   . Colon cancer Neg Hx   . Prostate cancer Neg Hx     ADVANCED DIRECTIVES (Y/N):  N  HEALTH MAINTENANCE: Social History   Tobacco Use  . Smoking status: Current Every Day Smoker    Packs/day: 0.25    Years: 35.00    Pack years: 8.75    Types: Cigarettes  . Smokeless tobacco: Never Used  Substance Use Topics  . Alcohol use: No  . Drug use: No     Colonoscopy:  PAP:  Bone density:  Lipid panel:  Allergies  Allergen Reactions  . Bupropion Hcl     seizures  Current Outpatient Medications  Medication Sig Dispense Refill  . cholecalciferol (VITAMIN D) 1000 UNITS tablet Take 1,000 Units by mouth daily.    . clonazePAM (KLONOPIN) 1 MG tablet TAKE 1/2 TO 1 TABLET BY MOUTH EVERY 6 HOURS AS NEEDED 30 tablet 5  . Cyanocobalamin (RA VITAMIN B-12 TR) 1000 MCG TBCR Take 1,000 mcg by mouth daily.    . DULoxetine (CYMBALTA) 20 MG capsule Take 20 mg by mouth daily.    Marland Kitchen  gabapentin (NEURONTIN) 800 MG tablet TAKE 1 TABLET BY MOUTH 3 TIMES DAILY AS NEEDED 90 tablet 3  . hydrochlorothiazide (MICROZIDE) 12.5 MG capsule Take 1 capsule (12.5 mg total) by mouth daily. 90 capsule 1  . latanoprost (XALATAN) 0.005 % ophthalmic solution Place 1 drop into both eyes at bedtime. Reported on 10/15/2015    . lisinopril (ZESTRIL) 10 MG tablet TAKE 1 TABLET BY MOUTH ONCE DAILY 30 tablet 12  . Magnesium 500 MG CAPS Take 1 capsule by mouth daily.     . meloxicam (MOBIC) 15 MG tablet Take 1 tablet (15 mg total) by mouth daily. (Patient not taking: Reported on 10/16/2018) 30 tablet 1  . omeprazole (PRILOSEC) 20 MG capsule TAKE 1 CAPSULE BY MOUTH ONCE DAILY 30 capsule 11  . oxyCODONE-acetaminophen (PERCOCET) 7.5-325 MG tablet Take 1 tablet by mouth every 4 (four) hours as needed for severe pain. 20 tablet 0  . propranolol ER (INDERAL LA) 160 MG SR capsule TAKE 1 CAPSULE BY MOUTH ONCE DAILY 30 each 12  . rosuvastatin (CRESTOR) 10 MG tablet Take 1 tablet (10 mg total) by mouth daily. (Patient not taking: Reported on 04/01/2019) 90 tablet 1  . SUMAtriptan (IMITREX) 100 MG tablet TAKE 1 TABLET BY MOUTH AT ONSET OF HEADACHE MAY TAKE AN ADDITIONAL TABLET IN 2 HOURS IF NEEDED MAX OF 200MG /24HR 9 tablet 6  . topiramate (TOPAMAX) 50 MG tablet TAKE 1 TABLET BY MOUTH ONCE DAILY 30 tablet 5   No current facility-administered medications for this visit.    OBJECTIVE: There were no vitals filed for this visit.   There is no height or weight on file to calculate BMI.    ECOG FS:0 - Asymptomatic  General: Well-developed, well-nourished, no acute distress. HEENT: Normocephalic. Neuro: Alert, answering all questions appropriately. Cranial nerves grossly intact. Psych: Normal affect.  LAB RESULTS:  Lab Results  Component Value Date   NA 140 02/22/2019   K 5.2 02/22/2019   CL 103 02/22/2019   CO2 24 02/22/2019   GLUCOSE 104 (H) 02/22/2019   BUN 13 02/22/2019   CREATININE 1.50 (H) 04/16/2019    CALCIUM 7.9 (L) 02/22/2019   PROT 6.3 02/22/2019   ALBUMIN 4.4 02/22/2019   AST 10 02/22/2019   ALT 7 02/22/2019   ALKPHOS 84 02/22/2019   BILITOT 0.2 02/22/2019   GFRNONAA 71 02/22/2019   GFRAA 82 02/22/2019    Lab Results  Component Value Date   WBC 7.8 04/01/2019   NEUTROABS 4.9 04/01/2019   HGB 14.7 04/01/2019   HCT 45.3 04/01/2019   MCV 90.8 04/01/2019   PLT 261 04/01/2019     STUDIES: CT Chest W Contrast  Result Date: 04/16/2019 CLINICAL DATA:  63 year old male with history of lymphadenopathy. Follow-up study. EXAM: CT CHEST, ABDOMEN, AND PELVIS WITH CONTRAST TECHNIQUE: Multidetector CT imaging of the chest, abdomen and pelvis was performed following the standard protocol during bolus administration of intravenous contrast. CONTRAST:  152mL OMNIPAQUE IOHEXOL 300 MG/ML  SOLN COMPARISON:  Chest CT 05/26/2008. PET-CT 06/04/2008. MRI of  the lumbar spine 12/04/2018. FINDINGS: CT CHEST FINDINGS Cardiovascular: Heart size is normal. There is no significant pericardial fluid, thickening or pericardial calcification. Aortic atherosclerosis. No definite coronary artery calcifications. Mediastinum/Nodes: No pathologically enlarged mediastinal or hilar lymph nodes. Esophagus is unremarkable in appearance. No axillary lymphadenopathy. Lungs/Pleura: Areas of pleural thickening, calcification and trace volume of pleural fluid in the right hemithorax. This is associated with areas of pleuroparenchymal thickening and architectural distortion, the appearance of which suggests developing rounded atelectasis both in the right middle and lower lobes. Scattered areas of mild scarring are noted in the left lung as well. Mild paraseptal emphysema. No definite suspicious appearing pulmonary nodules or masses are noted. Musculoskeletal: Severe S shaped scoliosis of the thoracolumbar spine convex to the right in the thoracic region and to the left in the lumbar region. There are no aggressive appearing lytic or  blastic lesions noted in the visualized portions of the skeleton. CT ABDOMEN PELVIS FINDINGS Hepatobiliary: No suspicious cystic or solid hepatic lesions. No intra or extrahepatic biliary ductal dilatation. Gallbladder is normal in appearance. Pancreas: No pancreatic mass. No pancreatic ductal dilatation. No pancreatic or peripancreatic fluid collections or inflammatory changes. Spleen: Unremarkable. Adrenals/Urinary Tract: Exophytic lesion in the upper pole the right kidney (axial image 51 of series 2) measuring 1.9 cm is intermediate to high attenuation (61 HU), incompletely characterized without noncontrast imaging. Left kidney and bilateral adrenal glands are normal in appearance. No hydroureteronephrosis. Urinary bladder is normal in appearance. Stomach/Bowel: Normal appearance of the stomach. No pathologic dilatation of small bowel or colon. Numerous colonic diverticulae are noted, particularly in the sigmoid colon, without surrounding inflammatory changes to suggest an acute diverticulitis at this time. Normal appendix. Vascular/Lymphatic: Aortic atherosclerosis, without evidence of aneurysm or dissection in the abdominal or pelvic vasculature. Left para-aortic lymph node measuring 1.5 cm in short axis (axial image 56 of series 2), decreased in size compared to the recent MRI of the lumbar spine, and decreased in size compared to remote prior CT 05/26/2008 at which point this measured 1.9 cm in short axis, presumably benign. No other lymphadenopathy noted elsewhere in the abdomen or pelvis. Reproductive: Prostate gland and seminal vesicles are unremarkable in appearance. Other: No significant volume of ascites.  No pneumoperitoneum. Musculoskeletal: There are no aggressive appearing lytic or blastic lesions noted in the visualized portions of the skeleton. IMPRESSION: 1. The left para-aortic lymph node of concern on the recent lumbar spine MRI has been present dating back to at least 2010 and is decreased in  size compared to both the recent prior MRI examination as well as a remote prior CT from 2010, presumably a benign finding. Exophytic 1.9 cm lesion in the upper pole the right kidney is indeterminate on today's study, but suspicious for potential neoplasm. Follow-up nonemergent abdominal MRI with and without IV gadolinium is recommended in the near future to better characterize this finding. 2. Chronic right-sided pleuroparenchymal scarring with areas of probable developing rounded atelectasis, likely sequela of remote right-sided infection. 3. Mild paraseptal emphysema. 4. Aortic atherosclerosis. 5. Colonic diverticulosis without evidence of acute diverticulitis at this time. 6. Additional incidental findings, as above Electronically Signed   By: Vinnie Langton M.D.   On: 04/16/2019 10:20   CT Abdomen Pelvis W Contrast  Result Date: 04/16/2019 CLINICAL DATA:  63 year old male with history of lymphadenopathy. Follow-up study. EXAM: CT CHEST, ABDOMEN, AND PELVIS WITH CONTRAST TECHNIQUE: Multidetector CT imaging of the chest, abdomen and pelvis was performed following the standard protocol during bolus administration of  intravenous contrast. CONTRAST:  125mL OMNIPAQUE IOHEXOL 300 MG/ML  SOLN COMPARISON:  Chest CT 05/26/2008. PET-CT 06/04/2008. MRI of the lumbar spine 12/04/2018. FINDINGS: CT CHEST FINDINGS Cardiovascular: Heart size is normal. There is no significant pericardial fluid, thickening or pericardial calcification. Aortic atherosclerosis. No definite coronary artery calcifications. Mediastinum/Nodes: No pathologically enlarged mediastinal or hilar lymph nodes. Esophagus is unremarkable in appearance. No axillary lymphadenopathy. Lungs/Pleura: Areas of pleural thickening, calcification and trace volume of pleural fluid in the right hemithorax. This is associated with areas of pleuroparenchymal thickening and architectural distortion, the appearance of which suggests developing rounded atelectasis both  in the right middle and lower lobes. Scattered areas of mild scarring are noted in the left lung as well. Mild paraseptal emphysema. No definite suspicious appearing pulmonary nodules or masses are noted. Musculoskeletal: Severe S shaped scoliosis of the thoracolumbar spine convex to the right in the thoracic region and to the left in the lumbar region. There are no aggressive appearing lytic or blastic lesions noted in the visualized portions of the skeleton. CT ABDOMEN PELVIS FINDINGS Hepatobiliary: No suspicious cystic or solid hepatic lesions. No intra or extrahepatic biliary ductal dilatation. Gallbladder is normal in appearance. Pancreas: No pancreatic mass. No pancreatic ductal dilatation. No pancreatic or peripancreatic fluid collections or inflammatory changes. Spleen: Unremarkable. Adrenals/Urinary Tract: Exophytic lesion in the upper pole the right kidney (axial image 51 of series 2) measuring 1.9 cm is intermediate to high attenuation (61 HU), incompletely characterized without noncontrast imaging. Left kidney and bilateral adrenal glands are normal in appearance. No hydroureteronephrosis. Urinary bladder is normal in appearance. Stomach/Bowel: Normal appearance of the stomach. No pathologic dilatation of small bowel or colon. Numerous colonic diverticulae are noted, particularly in the sigmoid colon, without surrounding inflammatory changes to suggest an acute diverticulitis at this time. Normal appendix. Vascular/Lymphatic: Aortic atherosclerosis, without evidence of aneurysm or dissection in the abdominal or pelvic vasculature. Left para-aortic lymph node measuring 1.5 cm in short axis (axial image 56 of series 2), decreased in size compared to the recent MRI of the lumbar spine, and decreased in size compared to remote prior CT 05/26/2008 at which point this measured 1.9 cm in short axis, presumably benign. No other lymphadenopathy noted elsewhere in the abdomen or pelvis. Reproductive: Prostate  gland and seminal vesicles are unremarkable in appearance. Other: No significant volume of ascites.  No pneumoperitoneum. Musculoskeletal: There are no aggressive appearing lytic or blastic lesions noted in the visualized portions of the skeleton. IMPRESSION: 1. The left para-aortic lymph node of concern on the recent lumbar spine MRI has been present dating back to at least 2010 and is decreased in size compared to both the recent prior MRI examination as well as a remote prior CT from 2010, presumably a benign finding. Exophytic 1.9 cm lesion in the upper pole the right kidney is indeterminate on today's study, but suspicious for potential neoplasm. Follow-up nonemergent abdominal MRI with and without IV gadolinium is recommended in the near future to better characterize this finding. 2. Chronic right-sided pleuroparenchymal scarring with areas of probable developing rounded atelectasis, likely sequela of remote right-sided infection. 3. Mild paraseptal emphysema. 4. Aortic atherosclerosis. 5. Colonic diverticulosis without evidence of acute diverticulitis at this time. 6. Additional incidental findings, as above Electronically Signed   By: Vinnie Langton M.D.   On: 04/16/2019 10:20   DG SCOLIOSIS EVAL COMPLETE SPINE 2 OR 3 VIEWS  Result Date: 03/26/2019 CLINICAL DATA:  Scoliosis. EXAM: DG SCOLIOSIS EVAL COMPLETE SPINE 2-3V COMPARISON:  Plain film  of the lumbar spine dated 09/07/2018. 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 Commensurate dextroscoliosis within the midthoracic spine measures approximately 60 degrees. No acute appearing osseous abnormality. Chronic appearing pleural thickening and/or small pleural effusion at the RIGHT lung base. Visualized bowel gas pattern is nonobstructive. IMPRESSION: 1. 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 2. Commensurate dextroscoliosis within the midthoracic spine measures approximately 60 degrees. Electronically Signed   By: Franki Cabot M.D.   On: 03/26/2019 13:50    ASSESSMENT: Lymphadenopathy.  PLAN:   1. Lymphadenopathy: Chronic and unchanged.  CT scan results from April 16, 2019 reviewed independently and report as above.  Patient noted to have enlarged periaortic lymph node on imaging in 2010.  No further intervention is needed. 2.  Renal mass: Imaging results revealed a 1.9 cm exophytic right kidney mass.  Recommendation was to have a dedicated abdominal MRI for further evaluation.  Patient states he does not wish to pursue this at this time secondary to financial reasons, but did agree to repeat MRI and follow-up in 6 months to discuss the results.    I provided 15 minutes of face-to-face video visit time during this encounter which included chart review. Greater than 50% was spent counseling as documented under my assessment & plan.   Patient expressed understanding and was in agreement with this plan. He also understands that He can call clinic at any time with any questions, concerns, or complaints.    Lloyd Huger, MD   04/23/2019 3:34 PM

## 2019-04-22 ENCOUNTER — Encounter: Payer: Self-pay | Admitting: Oncology

## 2019-04-22 NOTE — Progress Notes (Signed)
Patient prescreened for appointment. Patient has no concerns or questions.  

## 2019-04-23 ENCOUNTER — Inpatient Hospital Stay (HOSPITAL_BASED_OUTPATIENT_CLINIC_OR_DEPARTMENT_OTHER): Payer: BC Managed Care – PPO | Admitting: Oncology

## 2019-04-23 DIAGNOSIS — R591 Generalized enlarged lymph nodes: Secondary | ICD-10-CM

## 2019-04-30 ENCOUNTER — Other Ambulatory Visit: Payer: Self-pay

## 2019-04-30 ENCOUNTER — Ambulatory Visit (INDEPENDENT_AMBULATORY_CARE_PROVIDER_SITE_OTHER): Payer: BC Managed Care – PPO | Admitting: Family Medicine

## 2019-04-30 ENCOUNTER — Encounter: Payer: Self-pay | Admitting: Family Medicine

## 2019-04-30 VITALS — BP 160/83 | HR 67 | Wt 167.0 lb

## 2019-04-30 DIAGNOSIS — E782 Mixed hyperlipidemia: Secondary | ICD-10-CM

## 2019-04-30 DIAGNOSIS — I10 Essential (primary) hypertension: Secondary | ICD-10-CM

## 2019-04-30 MED ORDER — HYDROCHLOROTHIAZIDE 25 MG PO TABS: 25.0000 mg | ORAL_TABLET | Freq: Every day | ORAL | 1 refills | Status: DC

## 2019-04-30 NOTE — Patient Instructions (Signed)
.   Please review the attached list of medications and notify my office if there are any errors.   . Please bring all of your medications to every appointment so we can make sure that our medication list is the same as yours.   

## 2019-04-30 NOTE — Progress Notes (Signed)
Patient: Lucas Ramirez Male    DOB: 05-29-1955   64 y.o.   MRN: [id number] Visit Date: 04/30/2019  Today's Provider: Lelon Huh, MD   Chief Complaint  Patient presents with  . Hypertension  . Hyperlipidemia   Subjective:     HPI  Lipid/Cholesterol, Follow-up:   Last seen for this 2 months ago.  Management changes since that visit include starting Rosuvastatin 10mg  daily. . Last Lipid Panel:    Component Value Date/Time   CHOL 247 (H) 02/22/2019 0930   TRIG 212 (H) 02/22/2019 0930   HDL 32 (L) 02/22/2019 0930   CHOLHDL 7.7 (H) 02/22/2019 0930   LDLCALC 175 (H) 02/22/2019 0930    Risk factors for vascular disease include hypercholesterolemia and hypertension  He reports poor compliance with treatment. He filled prescription but has not started taking medication because he thought he could get cholesterol down with dietary improvement.  He is not having side effects.  Current symptoms include none  Weight trend: stable Prior visit with dietician: no Current diet: well balanced Current exercise: physical therapy  Wt Readings from Last 3 Encounters:  04/30/19 167 lb (75.8 kg)  04/01/19 163 lb 11.2 oz (74.3 kg)  02/22/19 164 lb 3.2 oz (74.5 kg)    -------------------------------------------------------------------  Hypertension, follow-up:  BP Readings from Last 3 Encounters:  04/30/19 (!) 160/83  04/01/19 (!) 156/71  02/22/19 (!) 160/75    He was last seen for hypertension 2 months ago.  BP at that visit was 160/75. Management since that visit includes starting HCTZ 12.5mg  daily in addition to Lisinopril. He reports good compliance with treatment. He is not having side effects.  He is not exercising regularly, just doing PT He is adherent to low salt diet.   Outside blood pressures are not being checked at home. He is experiencing none.  Patient denies chest pain, chest pressure/discomfort, irregular heart beat and palpitations.   Cardiovascular  risk factors include advanced age (older than 43 for men, 58 for women), dyslipidemia, hypertension and male gender.  Use of agents associated with hypertension: none.     Weight trend: stable Wt Readings from Last 3 Encounters:  04/30/19 167 lb (75.8 kg)  04/01/19 163 lb 11.2 oz (74.3 kg)  02/22/19 164 lb 3.2 oz (74.5 kg)    Current diet: in general, a "healthy" diet    ------------------------------------------------------------------------  Allergies  Allergen Reactions  . Bupropion Hcl     seizures     Current Outpatient Medications:  .  cholecalciferol (VITAMIN D) 1000 UNITS tablet, Take 1,000 Units by mouth daily., Disp: , Rfl:  .  clonazePAM (KLONOPIN) 1 MG tablet, TAKE 1/2 TO 1 TABLET BY MOUTH EVERY 6 HOURS AS NEEDED, Disp: 30 tablet, Rfl: 5 .  Cyanocobalamin (RA VITAMIN B-12 TR) 1000 MCG TBCR, Take 1,000 mcg by mouth daily., Disp: , Rfl:  .  DULoxetine (CYMBALTA) 20 MG capsule, Take 20 mg by mouth daily., Disp: , Rfl:  .  gabapentin (NEURONTIN) 800 MG tablet, TAKE 1 TABLET BY MOUTH 3 TIMES DAILY AS NEEDED, Disp: 90 tablet, Rfl: 3 .  hydrochlorothiazide (MICROZIDE) 12.5 MG capsule, Take 1 capsule (12.5 mg total) by mouth daily., Disp: 90 capsule, Rfl: 1 .  latanoprost (XALATAN) 0.005 % ophthalmic solution, Place 1 drop into both eyes at bedtime. Reported on 10/15/2015, Disp: , Rfl:  .  lisinopril (ZESTRIL) 10 MG tablet, TAKE 1 TABLET BY MOUTH ONCE DAILY, Disp: 30 tablet, Rfl: 12 .  Magnesium  500 MG CAPS, Take 1 capsule by mouth daily. , Disp: , Rfl:  .  meloxicam (MOBIC) 15 MG tablet, Take 1 tablet (15 mg total) by mouth daily., Disp: 30 tablet, Rfl: 1 .  omeprazole (PRILOSEC) 20 MG capsule, TAKE 1 CAPSULE BY MOUTH ONCE DAILY, Disp: 30 capsule, Rfl: 11 .  oxyCODONE-acetaminophen (PERCOCET) 7.5-325 MG tablet, Take 1 tablet by mouth every 4 (four) hours as needed for severe pain., Disp: 20 tablet, Rfl: 0 .  propranolol ER (INDERAL LA) 160 MG SR capsule, TAKE 1 CAPSULE BY MOUTH  ONCE DAILY, Disp: 30 each, Rfl: 12 .  rosuvastatin (CRESTOR) 10 MG tablet, Take 1 tablet (10 mg total) by mouth daily., Disp: 90 tablet, Rfl: 1 (PATIENT NOT TAKING) .  SUMAtriptan (IMITREX) 100 MG tablet, TAKE 1 TABLET BY MOUTH AT ONSET OF HEADACHE MAY TAKE AN ADDITIONAL TABLET IN 2 HOURS IF NEEDED MAX OF 200MG /24HR, Disp: 9 tablet, Rfl: 6 .  topiramate (TOPAMAX) 50 MG tablet, TAKE 1 TABLET BY MOUTH ONCE DAILY, Disp: 30 tablet, Rfl: 5  Review of Systems  Constitutional: Negative for appetite change, chills and fever.  Respiratory: Negative for chest tightness, shortness of breath and wheezing.   Cardiovascular: Negative for chest pain and palpitations.  Gastrointestinal: Negative for abdominal pain, nausea and vomiting.    Social History   Tobacco Use  . Smoking status: Current Every Day Smoker    Packs/day: 0.25    Years: 35.00    Pack years: 8.75    Types: Cigarettes  . Smokeless tobacco: Never Used  Substance Use Topics  . Alcohol use: No      Objective:   BP (!) 160/83 (BP Location: Right Arm, Patient Position: Sitting, Cuff Size: Normal)   Pulse 67   Wt 167 lb (75.8 kg)   BMI 26.95 kg/m  Vitals:   04/30/19 1621  BP: (!) 160/83  Pulse: 67  Weight: 167 lb (75.8 kg)  Body mass index is 26.95 kg/m.   Physical Exam  General appearance: Well developed, well nourished male, cooperative and in no acute distress Head: Normocephalic, without obvious abnormality, atraumatic Respiratory: Respirations even and unlabored, normal respiratory rate Extremities: All extremities are intact.  Skin: Skin color, texture, turgor normal. No rashes seen  Psych: Appropriate mood and affect. Neurologic: Mental status: Alert, oriented to person, place, and time, thought content appropriate.      Assessment & Plan    1. Essential hypertension Tolerating initiation of hctz but BP not to goal. Increase to - hydrochlorothiazide (HYDRODIURIL) 25 MG tablet; Take 1 tablet (25 mg total) by  mouth daily.  Dispense: 90 tablet; Refill: 1  2. Mixed hyperlipidemia He has not yet started rosuvastatin but now states he will give it a try and let me know if he has any adverse effects.   Return in 6 weeks to check BP and lipids.  The entirety of the information documented in the History of Present Illness, Review of Systems and Physical Exam were personally obtained by me. Portions of this information were initially documented by Idelle Jo, CMA and reviewed by me for thoroughness and accuracy.     Lelon Huh, MD  [facility]

## 2019-05-01 DIAGNOSIS — M6281 Muscle weakness (generalized): Secondary | ICD-10-CM | POA: Diagnosis not present

## 2019-05-01 DIAGNOSIS — M5416 Radiculopathy, lumbar region: Secondary | ICD-10-CM | POA: Diagnosis not present

## 2019-05-07 DIAGNOSIS — L570 Actinic keratosis: Secondary | ICD-10-CM | POA: Diagnosis not present

## 2019-05-07 DIAGNOSIS — D2262 Melanocytic nevi of left upper limb, including shoulder: Secondary | ICD-10-CM | POA: Diagnosis not present

## 2019-05-07 DIAGNOSIS — D225 Melanocytic nevi of trunk: Secondary | ICD-10-CM | POA: Diagnosis not present

## 2019-05-07 DIAGNOSIS — Z85828 Personal history of other malignant neoplasm of skin: Secondary | ICD-10-CM | POA: Diagnosis not present

## 2019-05-07 DIAGNOSIS — D2271 Melanocytic nevi of right lower limb, including hip: Secondary | ICD-10-CM | POA: Diagnosis not present

## 2019-05-07 DIAGNOSIS — X32XXXA Exposure to sunlight, initial encounter: Secondary | ICD-10-CM | POA: Diagnosis not present

## 2019-05-14 DIAGNOSIS — M5416 Radiculopathy, lumbar region: Secondary | ICD-10-CM | POA: Diagnosis not present

## 2019-05-14 DIAGNOSIS — M48062 Spinal stenosis, lumbar region with neurogenic claudication: Secondary | ICD-10-CM | POA: Diagnosis not present

## 2019-05-14 DIAGNOSIS — M5136 Other intervertebral disc degeneration, lumbar region: Secondary | ICD-10-CM | POA: Diagnosis not present

## 2019-05-16 ENCOUNTER — Other Ambulatory Visit: Payer: Self-pay | Admitting: Family Medicine

## 2019-05-16 DIAGNOSIS — M792 Neuralgia and neuritis, unspecified: Secondary | ICD-10-CM

## 2019-05-16 DIAGNOSIS — G629 Polyneuropathy, unspecified: Secondary | ICD-10-CM

## 2019-05-16 MED ORDER — OXYCODONE-ACETAMINOPHEN 7.5-325 MG PO TABS: 1.0000 | ORAL_TABLET | ORAL | 0 refills | Status: DC | PRN

## 2019-05-16 NOTE — Telephone Encounter (Signed)
Medication Refill - Medication:  oxyCODONE-acetaminophen (PERCOCET) 7.5-325 MG tablet [id number]   Has the patient contacted their pharmacy? No. (Agent: If no, request that the patient contact the pharmacy for the refill.) (Agent: If yes, when and what did the pharmacy advise?)  Preferred Pharmacy (with phone number or street name): [facility]   Agent: Please be advised that RX refills may take up to 3 business days. We ask that you follow-up with your pharmacy.

## 2019-05-28 DIAGNOSIS — M48062 Spinal stenosis, lumbar region with neurogenic claudication: Secondary | ICD-10-CM | POA: Diagnosis not present

## 2019-05-28 DIAGNOSIS — M5416 Radiculopathy, lumbar region: Secondary | ICD-10-CM | POA: Diagnosis not present

## 2019-05-28 DIAGNOSIS — M5136 Other intervertebral disc degeneration, lumbar region: Secondary | ICD-10-CM | POA: Diagnosis not present

## 2019-06-11 ENCOUNTER — Other Ambulatory Visit: Payer: Self-pay | Admitting: Family Medicine

## 2019-06-11 ENCOUNTER — Encounter: Payer: Self-pay | Admitting: Family Medicine

## 2019-06-11 ENCOUNTER — Other Ambulatory Visit: Payer: Self-pay

## 2019-06-11 ENCOUNTER — Ambulatory Visit (INDEPENDENT_AMBULATORY_CARE_PROVIDER_SITE_OTHER): Payer: BC Managed Care – PPO | Admitting: Family Medicine

## 2019-06-11 VITALS — BP 123/77 | HR 71 | Temp 96.8°F | Wt 167.0 lb

## 2019-06-11 DIAGNOSIS — E785 Hyperlipidemia, unspecified: Secondary | ICD-10-CM | POA: Diagnosis not present

## 2019-06-11 DIAGNOSIS — G629 Polyneuropathy, unspecified: Secondary | ICD-10-CM | POA: Diagnosis not present

## 2019-06-11 DIAGNOSIS — Z1211 Encounter for screening for malignant neoplasm of colon: Secondary | ICD-10-CM | POA: Diagnosis not present

## 2019-06-11 DIAGNOSIS — I10 Essential (primary) hypertension: Secondary | ICD-10-CM | POA: Diagnosis not present

## 2019-06-11 DIAGNOSIS — Z1159 Encounter for screening for other viral diseases: Secondary | ICD-10-CM

## 2019-06-11 DIAGNOSIS — Z114 Encounter for screening for human immunodeficiency virus [HIV]: Secondary | ICD-10-CM

## 2019-06-11 MED ORDER — OXYCODONE-ACETAMINOPHEN 7.5-325 MG PO TABS: 1.0000 | ORAL_TABLET | ORAL | 0 refills | Status: DC | PRN

## 2019-06-11 NOTE — Progress Notes (Signed)
Patient: Lucas Ramirez Male    DOB: [birth year]   64 y.o.   MRN: [id number] Visit Date: 06/11/2019  Today's Provider: Lelon Huh, MD   Chief Complaint  Patient presents with  . Hypertension   Subjective:     HPI  Hypertension, follow-up:  BP Readings from Last 3 Encounters:  06/11/19 123/77  04/30/19 (!) 160/83  04/01/19 (!) 156/71    He was last seen for hypertension 6 weeks ago.  BP at that visit was 160/83. Management since that visit includes increased HCTZ to 25mg  daily. He reports good compliance with treatment. He is not having side effects.  He is not exercising. He is adherent to low salt diet.   Outside blood pressures are not being checked at home. He is experiencing none.  Patient denies chest pain, chest pressure/discomfort, irregular heart beat and palpitations.   Cardiovascular risk factors include advanced age (older than 13 for men, 37 for women), hypertension and male gender.  Use of agents associated with hypertension: none.     Weight trend: stable Wt Readings from Last 3 Encounters:  06/11/19 167 lb (75.8 kg)  04/30/19 167 lb (75.8 kg)  04/01/19 163 lb 11.2 oz (74.3 kg)    Current diet: in general, a "healthy" diet    ------------------------------------------------------------------------  Allergies  Allergen Reactions  . Bupropion Hcl     seizures     Current Outpatient Medications:  .  cholecalciferol (VITAMIN D) 1000 UNITS tablet, Take 1,000 Units by mouth daily., Disp: , Rfl:  .  clonazePAM (KLONOPIN) 1 MG tablet, TAKE 1/2 TO 1 TABLET BY MOUTH EVERY 6 HOURS AS NEEDED, Disp: 30 tablet, Rfl: 5 .  Cyanocobalamin (RA VITAMIN B-12 TR) 1000 MCG TBCR, Take 1,000 mcg by mouth daily., Disp: , Rfl:  .  DULoxetine (CYMBALTA) 20 MG capsule, Take 20 mg by mouth daily., Disp: , Rfl:  .  gabapentin (NEURONTIN) 800 MG tablet, TAKE 1 TABLET BY MOUTH 3 TIMES DAILY AS NEEDED AS DIRECTED, Disp: 90 tablet, Rfl: 0 .  hydrochlorothiazide  (HYDRODIURIL) 25 MG tablet, Take 1 tablet (25 mg total) by mouth daily., Disp: 90 tablet, Rfl: 1 .  latanoprost (XALATAN) 0.005 % ophthalmic solution, Place 1 drop into both eyes at bedtime. Reported on 10/15/2015, Disp: , Rfl:  .  lisinopril (ZESTRIL) 10 MG tablet, TAKE 1 TABLET BY MOUTH ONCE DAILY, Disp: 30 tablet, Rfl: 12 .  Magnesium 500 MG CAPS, Take 1 capsule by mouth daily. , Disp: , Rfl:  .  meloxicam (MOBIC) 15 MG tablet, Take 1 tablet (15 mg total) by mouth daily., Disp: 30 tablet, Rfl: 1 .  omeprazole (PRILOSEC) 20 MG capsule, TAKE 1 CAPSULE BY MOUTH ONCE DAILY, Disp: 30 capsule, Rfl: 11 .  oxyCODONE-acetaminophen (PERCOCET) 7.5-325 MG tablet, Take 1 tablet by mouth every 4 (four) hours as needed for severe pain., Disp: 20 tablet, Rfl: 0 .  propranolol ER (INDERAL LA) 160 MG SR capsule, TAKE 1 CAPSULE BY MOUTH ONCE DAILY, Disp: 30 each, Rfl: 12 .  rosuvastatin (CRESTOR) 10 MG tablet, Take 1 tablet (10 mg total) by mouth daily., Disp: 90 tablet, Rfl: 1 .  SUMAtriptan (IMITREX) 100 MG tablet, TAKE 1 TABLET BY MOUTH AT ONSET OF HEADACHE MAY TAKE AN ADDITIONAL TABLET IN 2 HOURS IF NEEDED MAX OF 200MG /24HR, Disp: 9 tablet, Rfl: 6 .  topiramate (TOPAMAX) 50 MG tablet, TAKE 1 TABLET BY MOUTH ONCE DAILY, Disp: 30 tablet, Rfl: 5  Review of Systems  Constitutional: Negative for appetite change, chills and fever.  Respiratory: Negative for chest tightness, shortness of breath and wheezing.   Cardiovascular: Negative for chest pain and palpitations.  Gastrointestinal: Negative for abdominal pain, nausea and vomiting.    Social History   Tobacco Use  . Smoking status: Current Every Day Smoker    Packs/day: 0.25    Years: 35.00    Pack years: 8.75    Types: Cigarettes  . Smokeless tobacco: Never Used  Substance Use Topics  . Alcohol use: No      Objective:   BP 123/77 (BP Location: Right Arm, Patient Position: Sitting, Cuff Size: Normal)   Pulse 71   Temp (!) 96.8 F (36 C)  (Temporal)   Wt 167 lb (75.8 kg)   BMI 26.95 kg/m  Vitals:   06/11/19 1604  BP: 123/77  Pulse: 71  Temp: (!) 96.8 F (36 C)  TempSrc: Temporal  Weight: 167 lb (75.8 kg)  Body mass index is 26.95 kg/m.   Physical Exam  General appearance:  Overweight male, cooperative and in no acute distress Head: Normocephalic, without obvious abnormality, atraumatic Respiratory: Respirations even and unlabored, normal respiratory rate Extremities: All extremities are intact.  Skin: Skin color, texture, turgor normal. No rashes seen  Psych: Appropriate mood and affect. Neurologic: Mental status: Alert, oriented to person, place, and time, thought content appropriate.       Assessment & Plan    1. Essential hypertension Much better with increased dose of hctz. Continue current medications.   - Comprehensive metabolic panel - Magnesium  2. Hyperlipidemia, unspecified hyperlipidemia type Tolerating initiation of rosuvastatin well with no adverse effects.  - Lipid panel - Comprehensive metabolic panel  3. Neuropathy refill- oxyCODONE-acetaminophen (PERCOCET) 7.5-325 MG tablet; Take 1 tablet by mouth every 4 (four) hours as needed for severe pain.  Dispense: 20 tablet; Refill: 0  4. Colon cancer screening  - Ambulatory referral to gastroenterology for colonoscopy  5. Encounter for screening for HIV  - HIV Antibody (routine testing w rflx)  6. Need for hepatitis C screening test  - Hepatitis C antibody     Lelon Huh, MD  [facility]

## 2019-06-11 NOTE — Patient Instructions (Signed)
.   Please review the attached list of medications and notify my office if there are any errors.    Please bring all of your medications to every appointment so we can make sure that our medication list is the same as yours.   . Please go to the lab draw station in [address] on the second floor of [facility]  when you are fasting for 8 hours. Normal hours are 8:00am to 12:30pm and 1:30pm to 4:00pm Monday through Friday     

## 2019-06-12 ENCOUNTER — Telehealth: Payer: Self-pay

## 2019-06-12 ENCOUNTER — Other Ambulatory Visit: Payer: Self-pay

## 2019-06-12 DIAGNOSIS — Z1211 Encounter for screening for malignant neoplasm of colon: Secondary | ICD-10-CM

## 2019-06-12 NOTE — Telephone Encounter (Signed)
Gastroenterology Pre-Procedure Review  Request Date: Thursday 07/04/19 Requesting Physician: Dr. Vicente Males  PATIENT REVIEW QUESTIONS: The patient responded to the following health history questions as indicated:    1. Are you having any GI issues? yes (occasional diarrhea, appt offered and declined) 2. Do you have a personal history of Polyps? no 3. Do you have a family history of Colon Cancer or Polyps? no 4. Diabetes Mellitus? no 5. Joint replacements in the past 12 months?no 6. Major health problems in the past 3 months?no 7. Any artificial heart valves, MVP, or defibrillator?no    MEDICATIONS & ALLERGIES:    Patient reports the following regarding taking any anticoagulation/antiplatelet therapy:   Plavix, Coumadin, Eliquis, Xarelto, Lovenox, Pradaxa, Brilinta, or Effient? no Aspirin? no  Patient confirms/reports the following medications:  Current Outpatient Medications  Medication Sig Dispense Refill  . cholecalciferol (VITAMIN D) 1000 UNITS tablet Take 1,000 Units by mouth daily.    . clonazePAM (KLONOPIN) 1 MG tablet TAKE 1/2 TO 1 TABLET BY MOUTH EVERY 6 HOURS AS NEEDED 30 tablet 5  . Cyanocobalamin (RA VITAMIN B-12 TR) 1000 MCG TBCR Take 1,000 mcg by mouth daily.    . DULoxetine (CYMBALTA) 20 MG capsule Take 20 mg by mouth daily.    Marland Kitchen gabapentin (NEURONTIN) 800 MG tablet TAKE 1 TABLET BY MOUTH 3 TIMES DAILY AS NEEDED AS DIRECTED 90 tablet 0  . hydrochlorothiazide (HYDRODIURIL) 25 MG tablet Take 1 tablet (25 mg total) by mouth daily. 90 tablet 1  . latanoprost (XALATAN) 0.005 % ophthalmic solution Place 1 drop into both eyes at bedtime. Reported on 10/15/2015    . lisinopril (ZESTRIL) 10 MG tablet TAKE 1 TABLET BY MOUTH ONCE DAILY 30 tablet 12  . Magnesium 500 MG CAPS Take 1 capsule by mouth daily.     . meloxicam (MOBIC) 15 MG tablet Take 1 tablet (15 mg total) by mouth daily. 30 tablet 1  . omeprazole (PRILOSEC) 20 MG capsule TAKE 1 CAPSULE BY MOUTH ONCE DAILY 30 capsule 11  .  oxyCODONE-acetaminophen (PERCOCET) 7.5-325 MG tablet Take 1 tablet by mouth every 4 (four) hours as needed for severe pain. 20 tablet 0  . propranolol ER (INDERAL LA) 160 MG SR capsule TAKE 1 CAPSULE BY MOUTH ONCE DAILY 30 each 12  . rosuvastatin (CRESTOR) 10 MG tablet Take 1 tablet (10 mg total) by mouth daily. 90 tablet 1  . SUMAtriptan (IMITREX) 100 MG tablet TAKE 1 TABLET BY MOUTH AT ONSET OF HEADACHE MAY TAKE AN ADDITIONAL TABLET IN 2 HOURS IF NEEDED MAX OF 200MG /24HR 9 tablet 6   No current facility-administered medications for this visit.    Patient confirms/reports the following allergies:  Allergies  Allergen Reactions  . Bupropion Hcl     seizures    No orders of the defined types were placed in this encounter.   AUTHORIZATION INFORMATION Primary Insurance: 1D#: Group #:  Secondary Insurance: 1D#: Group #:  SCHEDULE INFORMATION: Date: 07/04/19 Time: Location:[facility]

## 2019-06-18 DIAGNOSIS — I10 Essential (primary) hypertension: Secondary | ICD-10-CM | POA: Diagnosis not present

## 2019-06-18 DIAGNOSIS — Z1159 Encounter for screening for other viral diseases: Secondary | ICD-10-CM | POA: Diagnosis not present

## 2019-06-18 DIAGNOSIS — Z114 Encounter for screening for human immunodeficiency virus [HIV]: Secondary | ICD-10-CM | POA: Diagnosis not present

## 2019-06-18 DIAGNOSIS — E785 Hyperlipidemia, unspecified: Secondary | ICD-10-CM | POA: Diagnosis not present

## 2019-06-19 LAB — COMPREHENSIVE METABOLIC PANEL
ALT: 11 IU/L (ref 0–44)
AST: 14 IU/L (ref 0–40)
Albumin/Globulin Ratio: 2 (ref 1.2–2.2)
Albumin: 4.3 g/dL (ref 3.8–4.8)
Alkaline Phosphatase: 72 IU/L (ref 39–117)
BUN/Creatinine Ratio: 17 (ref 10–24)
BUN: 25 mg/dL (ref 8–27)
Bilirubin Total: 0.3 mg/dL (ref 0.0–1.2)
CO2: 24 mmol/L (ref 20–29)
Calcium: 9.6 mg/dL (ref 8.6–10.2)
Chloride: 96 mmol/L (ref 96–106)
Creatinine, Ser: 1.48 mg/dL — ABNORMAL HIGH (ref 0.76–1.27)
GFR calc Af Amer: 57 mL/min/{1.73_m2} — ABNORMAL LOW (ref >59)
GFR calc non Af Amer: 50 mL/min/{1.73_m2} — ABNORMAL LOW (ref >59)
Globulin, Total: 2.2 g/dL (ref 1.5–4.5)
Glucose: 97 mg/dL (ref 65–99)
Potassium: 4.6 mmol/L (ref 3.5–5.2)
Sodium: 137 mmol/L (ref 134–144)
Total Protein: 6.5 g/dL (ref 6.0–8.5)

## 2019-06-19 LAB — HIV ANTIBODY (ROUTINE TESTING W REFLEX): HIV Screen 4th Generation wRfx: NONREACTIVE

## 2019-06-19 LAB — LIPID PANEL
Chol/HDL Ratio: 4.3 ratio (ref 0.0–5.0)
Cholesterol, Total: 126 mg/dL (ref 100–199)
HDL: 29 mg/dL — ABNORMAL LOW (ref >39)
LDL Chol Calc (NIH): 73 mg/dL (ref 0–99)
Triglycerides: 133 mg/dL (ref 0–149)
VLDL Cholesterol Cal: 24 mg/dL (ref 5–40)

## 2019-06-19 LAB — HEPATITIS C ANTIBODY: Hep C Virus Ab: <0.1 s/co ratio (ref 0.0–0.9)

## 2019-06-19 LAB — MAGNESIUM: Magnesium: 2 mg/dL (ref 1.6–2.3)

## 2019-06-25 DIAGNOSIS — H401131 Primary open-angle glaucoma, bilateral, mild stage: Secondary | ICD-10-CM | POA: Diagnosis not present

## 2019-07-02 ENCOUNTER — Other Ambulatory Visit
Admission: RE | Admit: 2019-07-02 | Discharge: 2019-07-02 | Disposition: A | Discharge status: Home / Self Care | Payer: BC Managed Care – PPO | Source: Ambulatory Visit | Attending: Gastroenterology | Admitting: Gastroenterology

## 2019-07-02 DIAGNOSIS — Z20822 Contact with and (suspected) exposure to covid-19: Secondary | ICD-10-CM | POA: Diagnosis not present

## 2019-07-02 DIAGNOSIS — Z01812 Encounter for preprocedural laboratory examination: Secondary | ICD-10-CM | POA: Diagnosis not present

## 2019-07-02 LAB — SARS CORONAVIRUS 2 (TAT 6-24 HRS): SARS Coronavirus 2: NEGATIVE

## 2019-07-04 ENCOUNTER — Ambulatory Visit
Admission: RE | Admit: 2019-07-04 | Discharge: 2019-07-04 | Disposition: A | Discharge status: Home / Self Care | Payer: BC Managed Care – PPO | Attending: Gastroenterology | Admitting: Gastroenterology

## 2019-07-04 ENCOUNTER — Ambulatory Visit: Payer: BC Managed Care – PPO | Admitting: Anesthesiology

## 2019-07-04 ENCOUNTER — Encounter
Admission: RE | Disposition: A | Discharge status: Home / Self Care | Payer: Self-pay | Source: Home / Self Care | Attending: Gastroenterology

## 2019-07-04 ENCOUNTER — Encounter: Payer: Self-pay | Admitting: Gastroenterology

## 2019-07-04 DIAGNOSIS — D120 Benign neoplasm of cecum: Secondary | ICD-10-CM | POA: Insufficient documentation

## 2019-07-04 DIAGNOSIS — D122 Benign neoplasm of ascending colon: Secondary | ICD-10-CM | POA: Diagnosis not present

## 2019-07-04 DIAGNOSIS — K635 Polyp of colon: Secondary | ICD-10-CM

## 2019-07-04 DIAGNOSIS — K5730 Diverticulosis of large intestine without perforation or abscess without bleeding: Secondary | ICD-10-CM | POA: Insufficient documentation

## 2019-07-04 DIAGNOSIS — G510 Bell's palsy: Secondary | ICD-10-CM | POA: Diagnosis not present

## 2019-07-04 DIAGNOSIS — Z79899 Other long term (current) drug therapy: Secondary | ICD-10-CM | POA: Diagnosis not present

## 2019-07-04 DIAGNOSIS — Z791 Long term (current) use of non-steroidal anti-inflammatories (NSAID): Secondary | ICD-10-CM | POA: Insufficient documentation

## 2019-07-04 DIAGNOSIS — K644 Residual hemorrhoidal skin tags: Secondary | ICD-10-CM | POA: Insufficient documentation

## 2019-07-04 DIAGNOSIS — K648 Other hemorrhoids: Secondary | ICD-10-CM | POA: Diagnosis not present

## 2019-07-04 DIAGNOSIS — F17210 Nicotine dependence, cigarettes, uncomplicated: Secondary | ICD-10-CM | POA: Insufficient documentation

## 2019-07-04 DIAGNOSIS — K5790 Diverticulosis of intestine, part unspecified, without perforation or abscess without bleeding: Secondary | ICD-10-CM | POA: Diagnosis not present

## 2019-07-04 DIAGNOSIS — I10 Essential (primary) hypertension: Secondary | ICD-10-CM | POA: Diagnosis not present

## 2019-07-04 DIAGNOSIS — Z888 Allergy status to other drugs, medicaments and biological substances status: Secondary | ICD-10-CM | POA: Diagnosis not present

## 2019-07-04 DIAGNOSIS — G43909 Migraine, unspecified, not intractable, without status migrainosus: Secondary | ICD-10-CM | POA: Insufficient documentation

## 2019-07-04 DIAGNOSIS — Z1211 Encounter for screening for malignant neoplasm of colon: Secondary | ICD-10-CM | POA: Diagnosis not present

## 2019-07-04 DIAGNOSIS — J449 Chronic obstructive pulmonary disease, unspecified: Secondary | ICD-10-CM | POA: Insufficient documentation

## 2019-07-04 DIAGNOSIS — K621 Rectal polyp: Secondary | ICD-10-CM | POA: Diagnosis not present

## 2019-07-04 HISTORY — PX: COLONOSCOPY WITH PROPOFOL: SHX5780

## 2019-07-04 SURGERY — COLONOSCOPY WITH PROPOFOL
Anesthesia: General

## 2019-07-04 MED ORDER — PROPOFOL 10 MG/ML IV BOLUS
INTRAVENOUS | Status: DC | PRN
  Administered 2019-07-04: 70 mg via INTRAVENOUS

## 2019-07-04 MED ORDER — PROPOFOL 500 MG/50ML IV EMUL
INTRAVENOUS | Status: DC | PRN
  Administered 2019-07-04: 140 ug/kg/min via INTRAVENOUS

## 2019-07-04 MED ORDER — SODIUM CHLORIDE 0.9 % IV SOLN: INTRAVENOUS | Status: DC

## 2019-07-04 MED ORDER — LIDOCAINE HCL (CARDIAC) PF 100 MG/5ML IV SOSY
PREFILLED_SYRINGE | INTRAVENOUS | Status: DC | PRN
  Administered 2019-07-04: 60 mg via INTRAVENOUS

## 2019-07-04 NOTE — Anesthesia Postprocedure Evaluation (Signed)
Anesthesia Post Note  Patient: Lucas Ramirez  Procedure(s) Performed: COLONOSCOPY WITH PROPOFOL (N/A )  Patient location during evaluation: PACU Anesthesia Type: General Level of consciousness: awake and alert Pain management: pain level controlled Vital Signs Assessment: post-procedure vital signs reviewed and stable Respiratory status: spontaneous breathing, nonlabored ventilation and respiratory function stable Cardiovascular status: blood pressure returned to baseline and stable Postop Assessment: no apparent nausea or vomiting Anesthetic complications: no     Last Vitals:  Vitals:   07/04/19 0950 07/04/19 1000  BP: (!) 132/106 (!) 158/76  Pulse: (!) 57   Resp: 16 16  Temp:    SpO2: 100% 100%    Last Pain:  Vitals:   07/04/19 0801  TempSrc: Temporal  PainSc: [name]

## 2019-07-04 NOTE — H&P (Signed)
Cephas Darby, MD [address]  [place]  [place], [place] [address]  Main: [phone]  Fax: [phone] Pager: [phone]  Primary Care Physician:  Birdie Sons, MD Primary Gastroenterologist:  Dr. Cephas Darby  Pre-Procedure History & Physical: HPI:  Lucas Ramirez is a 64 y.o. male is here for an colonoscopy.   Past Medical History:  Diagnosis Date  . CAP (community acquired pneumonia) 06/28/2015  . COPD (chronic obstructive pulmonary disease) ([place])   . Hypertension   . Migraine   . Shingles 2019   dx by dermatologist by patient report    Past Surgical History:  Procedure Laterality Date  . [place]   correcting Scoliosils per pt  . SKIN LESION EXCISION  06/06/2013   facial left cheek. Dr. Evorn Gong  . UPPER GI ENDOSCOPY  07/11/2008   [facility]. Dr. Donnella Sham. Impression. -LA Grade C reflux esophagitis. 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    Prior to Admission medications   Medication Sig Start Date End Date Taking? Authorizing Provider  hydrochlorothiazide (HYDRODIURIL) 25 MG tablet Take 1 tablet (25 mg total) by mouth daily. 04/30/19  Yes Birdie Sons, MD  lisinopril (ZESTRIL) 10 MG tablet TAKE 1 TABLET BY MOUTH ONCE DAILY 02/22/19  Yes Birdie Sons, MD  propranolol ER (INDERAL LA) 160 MG SR capsule TAKE 1 CAPSULE BY MOUTH ONCE DAILY 10/10/18  Yes Birdie Sons, MD  cholecalciferol (VITAMIN D) 1000 UNITS tablet Take 1,000 Units by mouth daily.    [provider]  clonazePAM (KLONOPIN) 1 MG tablet TAKE 1/2 TO 1 TABLET BY MOUTH EVERY 6 HOURS AS NEEDED 11/29/18   Birdie Sons, MD  Cyanocobalamin (RA VITAMIN B-12 TR) 1000 MCG TBCR Take 1,000 mcg by mouth daily.    [provider]  DULoxetine (CYMBALTA) 20 MG capsule Take 20 mg by mouth daily.    Vladimir Crofts, MD  gabapentin (NEURONTIN) 800 MG tablet TAKE 1 TABLET BY MOUTH 3 TIMES DAILY AS NEEDED AS DIRECTED 06/11/19   Birdie Sons, MD    latanoprost (XALATAN) 0.005 % ophthalmic solution Place 1 drop into both eyes at bedtime. Reported on 10/15/2015    [provider]  Magnesium 500 MG CAPS Take 1 capsule by mouth daily.     [provider]  meloxicam (MOBIC) 15 MG tablet Take 1 tablet (15 mg total) by mouth daily. 07/03/18   Edrick Kins, DPM  omeprazole (PRILOSEC) 20 MG capsule TAKE 1 CAPSULE BY MOUTH ONCE DAILY 08/27/18   Birdie Sons, MD  oxyCODONE-acetaminophen (PERCOCET) 7.5-325 MG tablet Take 1 tablet by mouth every 4 (four) hours as needed for severe pain. 06/11/19   Birdie Sons, MD  rosuvastatin (CRESTOR) 10 MG tablet Take 1 tablet (10 mg total) by mouth daily. 02/25/19   Birdie Sons, MD  SUMAtriptan (IMITREX) 100 MG tablet TAKE 1 TABLET BY MOUTH AT ONSET OF HEADACHE MAY TAKE AN ADDITIONAL TABLET IN 2 HOURS IF NEEDED MAX OF 200MG /24HR 10/19/17   Birdie Sons, MD    Allergies as of 06/12/2019 - Review Complete 06/11/2019  Allergen Reaction Noted  . Bupropion hcl  12/24/2014    Family History  Problem Relation Age of Onset  . Down syndrome Sister   . CAD Neg Hx   . Heart disease Neg Hx   . Colon cancer Neg Hx   . Prostate cancer Neg Hx     Social History   Socioeconomic History  .  Marital status: Married    Spouse name: Not on file  . Number of children: Not on file  . Years of education: Not on file  . Highest education level: Not on file  Occupational History  . Not on file  Tobacco Use  . Smoking status: Current Every Day Smoker    Packs/day: 0.25    Years: 35.00    Pack years: 8.75    Types: Cigarettes  . Smokeless tobacco: Never Used  Substance and Sexual Activity  . Alcohol use: No  . Drug use: No  . Sexual activity: Not on file  Other Topics Concern  . Not on file  Social History Narrative  . Not on file   Social Determinants of Health   Financial Resource Strain:   . Difficulty of Paying Living Expenses:   Food Insecurity:   . Worried About Paediatric nurse in the Last Year:   . Arboriculturist in the Last Year:   Transportation Needs:   . Film/video editor (Medical):   Marland Kitchen Lack of Transportation (Non-Medical):   Physical Activity:   . Days of Exercise per Week:   . Minutes of Exercise per Session:   Stress:   . Feeling of Stress :   Social Connections:   . Frequency of Communication with Friends and Family:   . Frequency of Social Gatherings with Friends and Family:   . Attends Religious Services:   . Active Member of Clubs or Organizations:   . Attends Archivist Meetings:   Marland Kitchen Marital Status:   Intimate Partner Violence:   . Fear of Current or Ex-Partner:   . Emotionally Abused:   Marland Kitchen Physically Abused:   . Sexually Abused:     Review of Systems: See HPI, otherwise negative ROS  Physical Exam: BP (!) 150/73   Pulse 60   Temp (!) 96.3 F (35.7 C) (Temporal)   Resp 17   Ht 5\' 6"  (1.676 m)   Wt 76.2 kg   SpO2 100%   BMI 27.12 kg/m  General:   Alert,  pleasant and cooperative in NAD Head:  Normocephalic and atraumatic. Neck:  Supple; no masses or thyromegaly. Lungs:  Clear throughout to auscultation.    Heart:  Regular rate and rhythm. Abdomen:  Soft, nontender and nondistended. Normal bowel sounds, without guarding, and without rebound.   Neurologic:  Alert and  oriented x4;  grossly normal neurologically.  Impression/Plan: Lucas Ramirez is here for an colonoscopy to be performed for colon cancer screening  Risks, benefits, limitations, and alternatives regarding  colonoscopy have been reviewed with the patient.  Questions have been answered.  All parties agreeable.   Sherri Sear, MD  07/04/2019, 8:17 AM

## 2019-07-04 NOTE — Op Note (Signed)
[facility] Gastroenterology Patient Name: Lucas Ramirez Procedure Date: 07/04/2019 8:54 AM MRN: [id number] Account #: [id number] Date of Birth: June 05, 1955 Admit Type: Outpatient Age: 64 Room: [facility] ENDO ROOM 4 Gender: Male Note Status: Finalized Procedure:             Colonoscopy Indications:           Screening for colorectal malignant neoplasm, Last                         colonoscopy: March 2010 Providers:             Lin Landsman MD, MD Referring MD:          Kirstie Peri. Caryn Section, MD (Referring MD) Medicines:             Monitored Anesthesia Care Complications:         No immediate complications. Estimated blood loss: None. Procedure:             Pre-Anesthesia Assessment:                        - Prior to the procedure, a History and Physical was                         performed, and patient medications and allergies were                         reviewed. The patient is competent. The risks and                         benefits of the procedure and the sedation options and                         risks were discussed with the patient. All questions                         were answered and informed consent was obtained.                         Patient identification and proposed procedure were                         verified by the physician, the nurse, the                         anesthesiologist, the anesthetist and the technician                         in the pre-procedure area in the procedure room in the                         endoscopy suite. Mental Status Examination: alert and                         oriented. Airway Examination: normal oropharyngeal                         airway and neck mobility. Respiratory Examination:  clear to auscultation. CV Examination: normal.                         Prophylactic Antibiotics: The patient does not require                         prophylactic antibiotics. Prior Anticoagulants: The                        patient has taken no previous anticoagulant or                         antiplatelet agents. ASA Grade Assessment: II - A                         patient with mild systemic disease. After reviewing                         the risks and benefits, the patient was deemed in                         satisfactory condition to undergo the procedure. The                         anesthesia plan was to use monitored anesthesia care                         (MAC). Immediately prior to administration of                         medications, the patient was re-assessed for adequacy                         to receive sedatives. The heart rate, respiratory                         rate, oxygen saturations, blood pressure, adequacy of                         pulmonary ventilation, and response to care were                         monitored throughout the procedure. The physical                         status of the patient was re-assessed after the                         procedure.                        After obtaining informed consent, the colonoscope was                         passed under direct vision. Throughout the procedure,                         the patient's blood pressure, pulse, and oxygen  saturations were monitored continuously. The                         Colonoscope was introduced through the anus and                         advanced to the the cecum, identified by appendiceal                         orifice and ileocecal valve. The colonoscopy was                         performed with moderate difficulty due to multiple                         diverticula in the colon and significant looping.                         Successful completion of the procedure was aided by                         applying abdominal pressure. The patient tolerated the                         procedure well. The quality of the bowel preparation                          was evaluated using the BBPS [facility] Bowel Preparation                         Scale) with scores of: Right Colon = 3, Transverse                         Colon = 3 and Left Colon = 3 (entire mucosa seen well                         with no residual staining, small fragments of stool or                         opaque liquid). The total BBPS score equals 9. Findings:      The perianal and digital rectal examinations were normal. Pertinent       negatives include normal sphincter tone and no palpable rectal lesions.      A 10 mm polyp was found in the cecum in between 2 diverticula. The polyp       was sessile. Preparations were made for mucosal resection. Saline was       injected to raise the lesion. Snare mucosal resection with hot and cold       snare was performed in 2 pieces. Resection was complete, and retrieval       was complete.      A 6 mm polyp was found in the ascending colon. The polyp was sessile.       The polyp was removed with a cold snare. Resection and retrieval were       complete.      A 4 mm polyp was found in the rectum. The polyp was sessile. The polyp  was removed with a cold snare. Resection and retrieval were complete.      Multiple diverticula were found in the entire colon.      Non-bleeding external and internal hemorrhoids were found during       retroflexion. The hemorrhoids were medium-sized.      A 3 mm polyp was found in the cecum. The polyp was sessile. The polyp       was removed with a cold snare. Resection and retrieval were complete. Impression:            - One 10 mm polyp in the cecum, removed with mucosal                         resection. Resected and retrieved.                        - One 6 mm polyp in the ascending colon, removed with                         a cold snare. Resected and retrieved.                        - One 4 mm polyp in the rectum, removed with a cold                         snare. Resected and retrieved.                         - Diverticulosis in the entire examined colon.                        - Non-bleeding external and internal hemorrhoids.                        - Mucosal resection was performed. Resection was                         complete, and retrieval was complete. Recommendation:        - Discharge patient to home (with escort).                        - Resume previous diet today.                        - Continue present medications.                        - Await pathology results.                        - Repeat colonoscopy in 3 - 5 years for surveillance                         based on pathology results. Procedure Code(s):     --- Professional ---                        [phone], Colonoscopy, flexible; with endoscopic mucosal  resection                        [id number], 59, Colonoscopy, flexible; with removal of                         tumor(s), polyp(s), or other lesion(s) by snare                         technique Diagnosis Code(s):     --- Professional ---                        Z12.11, Encounter for screening for malignant neoplasm                         of colon                        K63.5, Polyp of colon                        K64.8, Other hemorrhoids                        K62.1, Rectal polyp                        K57.30, Diverticulosis of large intestine without                         perforation or abscess without bleeding CPT copyright 2019 American Medical Association. All rights reserved. The codes documented in this report are preliminary and upon coder review may  be revised to meet current compliance requirements. Dr. Ulyess Mort Lin Landsman MD, MD 07/04/2019 9:32:02 AM This report has been signed electronically. Number of Addenda: 0 Note Initiated On: 07/04/2019 8:54 AM Scope Withdrawal Time: 0 hours 19 minutes 57 seconds  Total Procedure Duration: 0 hours 24 minutes 22 seconds  Estimated Blood Loss:  Estimated blood loss: none.       [facility]

## 2019-07-04 NOTE — Transfer of Care (Signed)
Immediate Anesthesia Transfer of Care Note  Patient: Lucas Ramirez  Procedure(s) Performed: COLONOSCOPY WITH PROPOFOL (N/A )  Patient Location: PACU  Anesthesia Type:General  Level of Consciousness: sedated  Airway & Oxygen Therapy: Patient Spontanous Breathing  Post-op Assessment: Report given to RN and Post -op Vital signs reviewed and stable  Post vital signs: Reviewed and stable  Last Vitals:  Vitals Value Taken Time  BP 142/70 07/04/19 0930  Temp    Pulse 63 07/04/19 0930  Resp 17 07/04/19 0930  SpO2 100 % 07/04/19 0930  Vitals shown include unvalidated device data.  Last Pain:  Vitals:   07/04/19 0801  TempSrc: Temporal  PainSc: 2          Complications: No apparent anesthesia complications

## 2019-07-04 NOTE — Anesthesia Preprocedure Evaluation (Addendum)
Anesthesia Evaluation  Patient identified by MRN, date of birth, ID band Patient awake    Reviewed: Allergy & Precautions, H&P , NPO status , Patient's Chart, lab work & pertinent test results  Airway Mallampati: II  TM Distance: >3 FB Neck ROM: full    Dental  (+) Teeth Intact   Pulmonary COPD, Current Smoker,           Cardiovascular hypertension, (-) angina(-) Past MI      Neuro/Psych  Headaches, Bell's palsy negative psych ROS   GI/Hepatic negative GI ROS, Neg liver ROS,   Endo/Other  negative endocrine ROS  Renal/GU negative Renal ROS  negative genitourinary   Musculoskeletal   Abdominal   Peds  Hematology negative hematology ROS (+)   Anesthesia Other Findings Past Medical History: 06/28/2015: CAP (community acquired pneumonia) No date: COPD (chronic obstructive pulmonary disease) ([place]) No date: Hypertension No date: Migraine 2019: Shingles     Comment:  dx by dermatologist by patient report  Past Surgical History: 1963: BACK SURGERY     Comment:  correcting Scoliosils per pt 06/06/2013: SKIN LESION EXCISION     Comment:  facial left cheek. Dr. Evorn Gong 07/11/2008: UPPER GI ENDOSCOPY     Comment:  [place]. Dr. Donnella Sham. Impression. -LA Grade C reflux               esophagitis. -Non-bleeding erosive gastropathy.-               Duodentitis without hemorrhage  BMI    Body Mass Index: 27.12 kg/m      Reproductive/Obstetrics negative OB ROS                            Anesthesia Physical Anesthesia Plan  ASA: II  Anesthesia Plan: General   Post-op Pain Management:    Induction:   PONV Risk Score and Plan: Propofol infusion and TIVA  Airway Management Planned:   Additional Equipment:   Intra-op Plan:   Post-operative Plan:   Informed Consent: I have reviewed the patients History and Physical, chart, labs and discussed the procedure including the risks, benefits and  alternatives for the proposed anesthesia with the patient or authorized representative who has indicated his/her understanding and acceptance.     Dental Advisory Given  Plan Discussed with: Anesthesiologist  Anesthesia Plan Comments:         Anesthesia Quick Evaluation

## 2019-07-05 ENCOUNTER — Encounter: Payer: Self-pay | Admitting: Gastroenterology

## 2019-07-05 ENCOUNTER — Encounter: Payer: Self-pay | Admitting: Family Medicine

## 2019-07-05 ENCOUNTER — Encounter: Payer: Self-pay | Admitting: *Deleted

## 2019-07-05 DIAGNOSIS — Z86010 Personal history of colonic polyps: Secondary | ICD-10-CM | POA: Insufficient documentation

## 2019-07-05 LAB — SURGICAL PATHOLOGY

## 2019-07-08 ENCOUNTER — Other Ambulatory Visit: Payer: Self-pay | Admitting: Family Medicine

## 2019-07-08 DIAGNOSIS — G629 Polyneuropathy, unspecified: Secondary | ICD-10-CM

## 2019-07-08 MED ORDER — OXYCODONE-ACETAMINOPHEN 7.5-325 MG PO TABS: 1.0000 | ORAL_TABLET | ORAL | 0 refills | Status: DC | PRN

## 2019-07-08 NOTE — Telephone Encounter (Signed)
Requested medication (s) are due for refill today: yes  Requested medication (s) are on the active medication list: yes  Last refill: 06/11/19  Future visit scheduled:no  Notes to clinic:  not delegated   Requested Prescriptions  Pending Prescriptions Disp Refills   oxyCODONE-acetaminophen (PERCOCET) 7.5-325 MG tablet 20 tablet 0    Sig: Take 1 tablet by mouth every 4 (four) hours as needed for severe pain.      Not Delegated - Analgesics:  Opioid Agonist Combinations Failed - 07/08/2019  8:25 AM      Failed - This refill cannot be delegated      Failed - Urine Drug Screen completed in last 360 days.      Passed - Valid encounter within last 6 months    Recent Outpatient Visits           3 weeks ago Essential hypertension   [facility] Birdie Sons, MD   2 months ago Essential hypertension   [facility] Birdie Sons, MD   4 months ago Essential hypertension   [facility] Birdie Sons, MD   5 months ago Edema, unspecified type   [facility] Birdie Sons, MD   8 months ago Neuropathy   [facility] Birdie Sons, MD       Future Appointments             In 3 months Grayland Ormond, Kathlene November, MD [facility]

## 2019-07-08 NOTE — Telephone Encounter (Signed)
Medication Refill - Medication:  oxyCODONE-acetaminophen (PERCOCET) 7.5-325 MG tablet   Has the patient contacted their pharmacy? Yes advised to call office.   Preferred Pharmacy (with phone number or street name):  [facility] [place], [place]. Phone:  [phone]  Fax:  [phone]       Agent: Please be advised that RX refills may take up to 3 business days. We ask that you follow-up with your pharmacy.

## 2019-07-10 ENCOUNTER — Other Ambulatory Visit: Payer: Self-pay | Admitting: Family Medicine

## 2019-07-10 DIAGNOSIS — G629 Polyneuropathy, unspecified: Secondary | ICD-10-CM

## 2019-07-15 ENCOUNTER — Ambulatory Visit: Payer: BC Managed Care – PPO | Attending: Internal Medicine

## 2019-07-15 DIAGNOSIS — Z23 Encounter for immunization: Secondary | ICD-10-CM

## 2019-07-15 DIAGNOSIS — H401131 Primary open-angle glaucoma, bilateral, mild stage: Secondary | ICD-10-CM | POA: Diagnosis not present

## 2019-07-15 NOTE — Progress Notes (Signed)
   Covid-19 Vaccination Clinic  Name:  Lucas Ramirez    MRN: [id number] DOB: 12-26-1955  07/15/2019  [name] was observed post Covid-19 immunization for 15 minutes without incident. He was provided with Vaccine Information Sheet and instruction to access the V-Safe system.   [name] was instructed to call 911 with any severe reactions post vaccine: Marland Kitchen Difficulty breathing  . Swelling of face and throat  . A fast heartbeat  . A bad rash all over body  . Dizziness and weakness   Immunizations Administered    Name Date Dose VIS Date Route   Pfizer COVID-19 Vaccine 07/15/2019  3:50 PM 0.3 mL 04/05/2019 Intramuscular   Manufacturer: [place]   Lot: [id number]   [place]: [id number]

## 2019-07-22 ENCOUNTER — Other Ambulatory Visit: Payer: Self-pay

## 2019-07-22 ENCOUNTER — Other Ambulatory Visit: Payer: Self-pay | Admitting: Family Medicine

## 2019-07-22 DIAGNOSIS — G43009 Migraine without aura, not intractable, without status migrainosus: Secondary | ICD-10-CM

## 2019-07-22 NOTE — Telephone Encounter (Signed)
Please review

## 2019-07-23 MED ORDER — CLONAZEPAM 1 MG PO TABS: ORAL_TABLET | ORAL | 5 refills | Status: DC

## 2019-07-23 NOTE — Addendum Note (Signed)
Addended by: Birdie Sons on: 07/23/2019 10:19 AM   Modules accepted: Orders

## 2019-08-01 DIAGNOSIS — M9903 Segmental and somatic dysfunction of lumbar region: Secondary | ICD-10-CM | POA: Diagnosis not present

## 2019-08-01 DIAGNOSIS — M9904 Segmental and somatic dysfunction of sacral region: Secondary | ICD-10-CM | POA: Diagnosis not present

## 2019-08-01 DIAGNOSIS — M5441 Lumbago with sciatica, right side: Secondary | ICD-10-CM | POA: Diagnosis not present

## 2019-08-01 DIAGNOSIS — M9953 Intervertebral disc stenosis of neural canal of lumbar region: Secondary | ICD-10-CM | POA: Diagnosis not present

## 2019-08-05 ENCOUNTER — Ambulatory Visit: Payer: BC Managed Care – PPO | Attending: Internal Medicine

## 2019-08-05 DIAGNOSIS — Z23 Encounter for immunization: Secondary | ICD-10-CM

## 2019-08-05 NOTE — Progress Notes (Signed)
   Covid-19 Vaccination Clinic  Name:  Lucas Ramirez    MRN: [id number] DOB: Sep 12, 1955  08/05/2019  [name] was observed post Covid-19 immunization for 15 minutes without incident. He was provided with Vaccine Information Sheet and instruction to access the V-Safe system.   [name] was instructed to call 911 with any severe reactions post vaccine: Marland Kitchen Difficulty breathing  . Swelling of face and throat  . A fast heartbeat  . A bad rash all over body  . Dizziness and weakness   Immunizations Administered    Name Date Dose VIS Date Route   Pfizer COVID-19 Vaccine 08/05/2019  3:22 PM 0.3 mL 04/05/2019 Intramuscular   Manufacturer: [place]   Lot: [id number]   [place]: [id number]

## 2019-08-06 DIAGNOSIS — M9953 Intervertebral disc stenosis of neural canal of lumbar region: Secondary | ICD-10-CM | POA: Diagnosis not present

## 2019-08-06 DIAGNOSIS — M9903 Segmental and somatic dysfunction of lumbar region: Secondary | ICD-10-CM | POA: Diagnosis not present

## 2019-08-06 DIAGNOSIS — M5441 Lumbago with sciatica, right side: Secondary | ICD-10-CM | POA: Diagnosis not present

## 2019-08-06 DIAGNOSIS — M9904 Segmental and somatic dysfunction of sacral region: Secondary | ICD-10-CM | POA: Diagnosis not present

## 2019-08-12 DIAGNOSIS — M5441 Lumbago with sciatica, right side: Secondary | ICD-10-CM | POA: Diagnosis not present

## 2019-08-12 DIAGNOSIS — M9903 Segmental and somatic dysfunction of lumbar region: Secondary | ICD-10-CM | POA: Diagnosis not present

## 2019-08-12 DIAGNOSIS — M9904 Segmental and somatic dysfunction of sacral region: Secondary | ICD-10-CM | POA: Diagnosis not present

## 2019-08-12 DIAGNOSIS — M9953 Intervertebral disc stenosis of neural canal of lumbar region: Secondary | ICD-10-CM | POA: Diagnosis not present

## 2019-08-14 DIAGNOSIS — M9953 Intervertebral disc stenosis of neural canal of lumbar region: Secondary | ICD-10-CM | POA: Diagnosis not present

## 2019-08-14 DIAGNOSIS — M5441 Lumbago with sciatica, right side: Secondary | ICD-10-CM | POA: Diagnosis not present

## 2019-08-14 DIAGNOSIS — M9904 Segmental and somatic dysfunction of sacral region: Secondary | ICD-10-CM | POA: Diagnosis not present

## 2019-08-14 DIAGNOSIS — M9903 Segmental and somatic dysfunction of lumbar region: Secondary | ICD-10-CM | POA: Diagnosis not present

## 2019-08-16 ENCOUNTER — Other Ambulatory Visit: Payer: Self-pay | Admitting: Family Medicine

## 2019-08-16 DIAGNOSIS — G629 Polyneuropathy, unspecified: Secondary | ICD-10-CM

## 2019-08-16 NOTE — Telephone Encounter (Signed)
Copied from [place] [id number]. Topic: Quick Communication - Rx Refill/Question >> Aug 16, 2019  2:15 PM Leward Quan A wrote: Medication: oxyCODONE-acetaminophen (PERCOCET) 7.5-325 MG tablet   Has the patient contacted their pharmacy? Yes.   (Agent: If no, request that the patient contact the pharmacy for the refill.) (Agent: If yes, when and what did the pharmacy advise?)  Preferred Pharmacy (with phone number or street name): [facility] [place], [place].  Phone:  [phone] Fax:  [phone]     Agent: Please be advised that RX refills may take up to 3 business days. We ask that you follow-up with your pharmacy.

## 2019-08-19 DIAGNOSIS — M5441 Lumbago with sciatica, right side: Secondary | ICD-10-CM | POA: Diagnosis not present

## 2019-08-19 DIAGNOSIS — M9904 Segmental and somatic dysfunction of sacral region: Secondary | ICD-10-CM | POA: Diagnosis not present

## 2019-08-19 DIAGNOSIS — M9953 Intervertebral disc stenosis of neural canal of lumbar region: Secondary | ICD-10-CM | POA: Diagnosis not present

## 2019-08-19 DIAGNOSIS — M9903 Segmental and somatic dysfunction of lumbar region: Secondary | ICD-10-CM | POA: Diagnosis not present

## 2019-08-19 MED ORDER — OXYCODONE-ACETAMINOPHEN 7.5-325 MG PO TABS: 1.0000 | ORAL_TABLET | ORAL | 0 refills | Status: DC | PRN

## 2019-08-21 DIAGNOSIS — M9903 Segmental and somatic dysfunction of lumbar region: Secondary | ICD-10-CM | POA: Diagnosis not present

## 2019-08-21 DIAGNOSIS — M9953 Intervertebral disc stenosis of neural canal of lumbar region: Secondary | ICD-10-CM | POA: Diagnosis not present

## 2019-08-21 DIAGNOSIS — M9904 Segmental and somatic dysfunction of sacral region: Secondary | ICD-10-CM | POA: Diagnosis not present

## 2019-08-21 DIAGNOSIS — M5441 Lumbago with sciatica, right side: Secondary | ICD-10-CM | POA: Diagnosis not present

## 2019-08-22 DIAGNOSIS — M9904 Segmental and somatic dysfunction of sacral region: Secondary | ICD-10-CM | POA: Diagnosis not present

## 2019-08-22 DIAGNOSIS — M9903 Segmental and somatic dysfunction of lumbar region: Secondary | ICD-10-CM | POA: Diagnosis not present

## 2019-08-22 DIAGNOSIS — M5441 Lumbago with sciatica, right side: Secondary | ICD-10-CM | POA: Diagnosis not present

## 2019-08-22 DIAGNOSIS — M9953 Intervertebral disc stenosis of neural canal of lumbar region: Secondary | ICD-10-CM | POA: Diagnosis not present

## 2019-08-26 DIAGNOSIS — M5441 Lumbago with sciatica, right side: Secondary | ICD-10-CM | POA: Diagnosis not present

## 2019-08-26 DIAGNOSIS — M9953 Intervertebral disc stenosis of neural canal of lumbar region: Secondary | ICD-10-CM | POA: Diagnosis not present

## 2019-08-26 DIAGNOSIS — M9904 Segmental and somatic dysfunction of sacral region: Secondary | ICD-10-CM | POA: Diagnosis not present

## 2019-08-26 DIAGNOSIS — M9903 Segmental and somatic dysfunction of lumbar region: Secondary | ICD-10-CM | POA: Diagnosis not present

## 2019-08-27 DIAGNOSIS — M9953 Intervertebral disc stenosis of neural canal of lumbar region: Secondary | ICD-10-CM | POA: Diagnosis not present

## 2019-08-27 DIAGNOSIS — M5441 Lumbago with sciatica, right side: Secondary | ICD-10-CM | POA: Diagnosis not present

## 2019-08-27 DIAGNOSIS — M9903 Segmental and somatic dysfunction of lumbar region: Secondary | ICD-10-CM | POA: Diagnosis not present

## 2019-08-27 DIAGNOSIS — M9904 Segmental and somatic dysfunction of sacral region: Secondary | ICD-10-CM | POA: Diagnosis not present

## 2019-08-29 DIAGNOSIS — M9953 Intervertebral disc stenosis of neural canal of lumbar region: Secondary | ICD-10-CM | POA: Diagnosis not present

## 2019-08-29 DIAGNOSIS — M9904 Segmental and somatic dysfunction of sacral region: Secondary | ICD-10-CM | POA: Diagnosis not present

## 2019-08-29 DIAGNOSIS — M5441 Lumbago with sciatica, right side: Secondary | ICD-10-CM | POA: Diagnosis not present

## 2019-08-29 DIAGNOSIS — M9903 Segmental and somatic dysfunction of lumbar region: Secondary | ICD-10-CM | POA: Diagnosis not present

## 2019-09-03 DIAGNOSIS — M9953 Intervertebral disc stenosis of neural canal of lumbar region: Secondary | ICD-10-CM | POA: Diagnosis not present

## 2019-09-03 DIAGNOSIS — M9904 Segmental and somatic dysfunction of sacral region: Secondary | ICD-10-CM | POA: Diagnosis not present

## 2019-09-03 DIAGNOSIS — M9903 Segmental and somatic dysfunction of lumbar region: Secondary | ICD-10-CM | POA: Diagnosis not present

## 2019-09-03 DIAGNOSIS — M5441 Lumbago with sciatica, right side: Secondary | ICD-10-CM | POA: Diagnosis not present

## 2019-09-04 ENCOUNTER — Other Ambulatory Visit: Payer: Self-pay | Admitting: Family Medicine

## 2019-09-04 DIAGNOSIS — M9953 Intervertebral disc stenosis of neural canal of lumbar region: Secondary | ICD-10-CM | POA: Diagnosis not present

## 2019-09-04 DIAGNOSIS — M5441 Lumbago with sciatica, right side: Secondary | ICD-10-CM | POA: Diagnosis not present

## 2019-09-04 DIAGNOSIS — M9904 Segmental and somatic dysfunction of sacral region: Secondary | ICD-10-CM | POA: Diagnosis not present

## 2019-09-04 DIAGNOSIS — M9903 Segmental and somatic dysfunction of lumbar region: Secondary | ICD-10-CM | POA: Diagnosis not present

## 2019-09-05 DIAGNOSIS — M9903 Segmental and somatic dysfunction of lumbar region: Secondary | ICD-10-CM | POA: Diagnosis not present

## 2019-09-05 DIAGNOSIS — M5441 Lumbago with sciatica, right side: Secondary | ICD-10-CM | POA: Diagnosis not present

## 2019-09-05 DIAGNOSIS — M9904 Segmental and somatic dysfunction of sacral region: Secondary | ICD-10-CM | POA: Diagnosis not present

## 2019-09-05 DIAGNOSIS — M9953 Intervertebral disc stenosis of neural canal of lumbar region: Secondary | ICD-10-CM | POA: Diagnosis not present

## 2019-09-10 DIAGNOSIS — M9903 Segmental and somatic dysfunction of lumbar region: Secondary | ICD-10-CM | POA: Diagnosis not present

## 2019-09-10 DIAGNOSIS — M9953 Intervertebral disc stenosis of neural canal of lumbar region: Secondary | ICD-10-CM | POA: Diagnosis not present

## 2019-09-10 DIAGNOSIS — M5441 Lumbago with sciatica, right side: Secondary | ICD-10-CM | POA: Diagnosis not present

## 2019-09-10 DIAGNOSIS — M9904 Segmental and somatic dysfunction of sacral region: Secondary | ICD-10-CM | POA: Diagnosis not present

## 2019-09-12 DIAGNOSIS — M5441 Lumbago with sciatica, right side: Secondary | ICD-10-CM | POA: Diagnosis not present

## 2019-09-12 DIAGNOSIS — M9903 Segmental and somatic dysfunction of lumbar region: Secondary | ICD-10-CM | POA: Diagnosis not present

## 2019-09-12 DIAGNOSIS — M9904 Segmental and somatic dysfunction of sacral region: Secondary | ICD-10-CM | POA: Diagnosis not present

## 2019-09-12 DIAGNOSIS — M9953 Intervertebral disc stenosis of neural canal of lumbar region: Secondary | ICD-10-CM | POA: Diagnosis not present

## 2019-09-17 DIAGNOSIS — M5441 Lumbago with sciatica, right side: Secondary | ICD-10-CM | POA: Diagnosis not present

## 2019-09-17 DIAGNOSIS — M9904 Segmental and somatic dysfunction of sacral region: Secondary | ICD-10-CM | POA: Diagnosis not present

## 2019-09-17 DIAGNOSIS — M9953 Intervertebral disc stenosis of neural canal of lumbar region: Secondary | ICD-10-CM | POA: Diagnosis not present

## 2019-09-17 DIAGNOSIS — M9903 Segmental and somatic dysfunction of lumbar region: Secondary | ICD-10-CM | POA: Diagnosis not present

## 2019-09-19 DIAGNOSIS — M9904 Segmental and somatic dysfunction of sacral region: Secondary | ICD-10-CM | POA: Diagnosis not present

## 2019-09-19 DIAGNOSIS — M5441 Lumbago with sciatica, right side: Secondary | ICD-10-CM | POA: Diagnosis not present

## 2019-09-19 DIAGNOSIS — M9903 Segmental and somatic dysfunction of lumbar region: Secondary | ICD-10-CM | POA: Diagnosis not present

## 2019-09-19 DIAGNOSIS — M9953 Intervertebral disc stenosis of neural canal of lumbar region: Secondary | ICD-10-CM | POA: Diagnosis not present

## 2019-09-24 ENCOUNTER — Other Ambulatory Visit: Payer: Self-pay | Admitting: Family Medicine

## 2019-09-24 DIAGNOSIS — G629 Polyneuropathy, unspecified: Secondary | ICD-10-CM

## 2019-09-24 MED ORDER — OXYCODONE-ACETAMINOPHEN 7.5-325 MG PO TABS: 1.0000 | ORAL_TABLET | ORAL | 0 refills | Status: DC | PRN

## 2019-09-24 NOTE — Telephone Encounter (Signed)
Requested medication (s) are due for refill today:  Yes  Requested medication (s) are on the active medication list:  Yes  Future visit scheduled:  No  Last Refill: 08/19/19; #20; no refills  Note to clinic: Med. not delegated  Requested Prescriptions  Pending Prescriptions Disp Refills   oxyCODONE-acetaminophen (PERCOCET) 7.5-325 MG tablet 20 tablet 0    Sig: Take 1 tablet by mouth every 4 (four) hours as needed for severe pain.      Not Delegated - Analgesics:  Opioid Agonist Combinations Failed - 09/24/2019 11:30 AM      Failed - This refill cannot be delegated      Failed - Urine Drug Screen completed in last 360 days.      Passed - Valid encounter within last 6 months    Recent Outpatient Visits           3 months ago Essential hypertension   [facility] Birdie Sons, MD   4 months ago Essential hypertension   [facility] Birdie Sons, MD   7 months ago Essential hypertension   [facility] Birdie Sons, MD   8 months ago Edema, unspecified type   [facility] Birdie Sons, MD   11 months ago Neuropathy   [facility] Birdie Sons, MD       Future Appointments             In 4 weeks Grayland Ormond, Kathlene November, MD [facility]

## 2019-09-24 NOTE — Telephone Encounter (Signed)
Copied from [place] [id number]. Topic: Quick Communication - Rx Refill/Question >> Sep 24, 2019 11:26 AM Leward Quan A wrote: Medication: oxyCODONE-acetaminophen (PERCOCET) 7.5-325 MG tablet   Has the patient contacted their pharmacy? Yes.   (Agent: If no, request that the patient contact the pharmacy for the refill.) (Agent: If yes, when and what did the pharmacy advise?)  Preferred Pharmacy (with phone number or street name): [facility] [place], [place].  Phone:  [phone] Fax:  [phone]     Agent: Please be advised that RX refills may take up to 3 business days. We ask that you follow-up with your pharmacy.

## 2019-09-26 DIAGNOSIS — M9953 Intervertebral disc stenosis of neural canal of lumbar region: Secondary | ICD-10-CM | POA: Diagnosis not present

## 2019-09-26 DIAGNOSIS — M9903 Segmental and somatic dysfunction of lumbar region: Secondary | ICD-10-CM | POA: Diagnosis not present

## 2019-09-26 DIAGNOSIS — M5441 Lumbago with sciatica, right side: Secondary | ICD-10-CM | POA: Diagnosis not present

## 2019-09-26 DIAGNOSIS — M9904 Segmental and somatic dysfunction of sacral region: Secondary | ICD-10-CM | POA: Diagnosis not present

## 2019-10-01 ENCOUNTER — Other Ambulatory Visit: Payer: Self-pay | Admitting: Family Medicine

## 2019-10-01 DIAGNOSIS — G629 Polyneuropathy, unspecified: Secondary | ICD-10-CM

## 2019-10-03 ENCOUNTER — Encounter: Payer: Self-pay | Admitting: Physician Assistant

## 2019-10-03 ENCOUNTER — Other Ambulatory Visit: Payer: Self-pay

## 2019-10-03 ENCOUNTER — Ambulatory Visit: Payer: BC Managed Care – PPO | Admitting: Physician Assistant

## 2019-10-03 VITALS — BP 166/80 | HR 64 | Temp 96.2°F | Resp 16 | Wt 163.0 lb

## 2019-10-03 DIAGNOSIS — G43009 Migraine without aura, not intractable, without status migrainosus: Secondary | ICD-10-CM | POA: Diagnosis not present

## 2019-10-03 MED ORDER — KETOROLAC TROMETHAMINE 30 MG/ML IJ SOLN: 30.0000 mg | Freq: Once | INTRAMUSCULAR | Status: DC

## 2019-10-03 MED ORDER — KETOROLAC TROMETHAMINE 60 MG/2ML IM SOLN
60.0000 mg | Freq: Once | INTRAMUSCULAR | Status: AC
  Administered 2019-10-03: 30 mg via INTRAMUSCULAR

## 2019-10-03 NOTE — Progress Notes (Signed)
I,Roshena L Chambers,acting as a scribe for Trinna Post, PA-C.,have documented all relevant documentation on the behalf of Trinna Post, PA-C,as directed by  Trinna Post, PA-C while in the presence of Trinna Post, PA-C.   Established patient visit   Patient: Lucas Ramirez   DOB: 06/03/1955   64 y.o. Male  MRN: [id number] Visit Date: 10/03/2019  Today's healthcare provider: Trinna Post, PA-C   Chief Complaint  Patient presents with  . Migraine    x 3 days   Subjective    HPI  Migraine: Patient has a history of chronic migraines. Current treatment includes Imitrex.  He reports good compliance with treatment. He feels that condition is Worse. He has had an intermittent migraine for the past 3 days. He states the Imitrex is not helping. He has tried Tylenol and Tramadol to help with pain.  He is not having side effects. Patient rates migraine 9/10. He has had dizziness and blurred vision since onset of migraine. Patient denies slurred speech or confusion. He states he took 4mg 's of Klonopin last night in hopes to sleep off the migraine. He is now having trouble walking since taking Klonopin. His last migraine was 2 years ago. He states his migraines usually last 1 day. Denies this being the worst headache of his life. Not on blood thinners.   -----------------------------------------------------------------------------------------       Medications: Outpatient Medications Prior to Visit  Medication Sig  . cholecalciferol (VITAMIN D) 1000 UNITS tablet Take 1,000 Units by mouth daily.  . clonazePAM (KLONOPIN) 1 MG tablet TAKE 1/2 TO 1 TABLET BY MOUTH EVERY 6 HOURS AS NEEDED  . Cyanocobalamin (RA VITAMIN B-12 TR) 1000 MCG TBCR Take 1,000 mcg by mouth daily.  . DULoxetine (CYMBALTA) 20 MG capsule Take 20 mg by mouth daily.  Marland Kitchen gabapentin (NEURONTIN) 800 MG tablet TAKE 1 TABLET BY MOUTH 3 TIMES DAILY AS NEEDED AS DIRECTED  . hydrochlorothiazide (HYDRODIURIL)  25 MG tablet Take 1 tablet (25 mg total) by mouth daily.  Marland Kitchen latanoprost (XALATAN) 0.005 % ophthalmic solution Place 1 drop into both eyes at bedtime. Reported on 10/15/2015  . lisinopril (ZESTRIL) 10 MG tablet TAKE 1 TABLET BY MOUTH ONCE DAILY  . Magnesium 500 MG CAPS Take 1 capsule by mouth daily.   . meloxicam (MOBIC) 15 MG tablet Take 1 tablet (15 mg total) by mouth daily.  Marland Kitchen omeprazole (PRILOSEC) 20 MG capsule TAKE 1 CAPSULE BY MOUTH ONCE DAILY  . oxyCODONE-acetaminophen (PERCOCET) 7.5-325 MG tablet Take 1 tablet by mouth every 4 (four) hours as needed for severe pain.  Marland Kitchen propranolol ER (INDERAL LA) 160 MG SR capsule TAKE 1 CAPSULE BY MOUTH ONCE DAILY  . rosuvastatin (CRESTOR) 10 MG tablet Take 1 tablet (10 mg total) by mouth daily.  . SUMAtriptan (IMITREX) 100 MG tablet TAKE 1 TABLET BY MOUTH AT ONSET OF HEADACHE MAY TAKE AN ADDITIONAL TABLET IN 2 HOURS IF NEEDED MAX OF 200MG /24HR   No facility-administered medications prior to visit.    Review of Systems  Constitutional: Negative for appetite change, chills, fatigue and fever.  Eyes: Positive for visual disturbance (blurred vision).  Respiratory: Negative for chest tightness, shortness of breath and wheezing.   Cardiovascular: Negative for chest pain and palpitations.  Gastrointestinal: Negative for abdominal pain, nausea and vomiting.  Neurological: Positive for dizziness and headaches. Negative for weakness.      Objective    BP (!) 166/80 (BP Location: Right Arm, Cuff Size: Normal)  Pulse 64   Temp (!) 96.2 F (35.7 C) (Temporal)   Resp 16   Wt 163 lb (73.9 kg)   SpO2 98% Comment: room air  BMI 26.31 kg/m    Physical Exam Constitutional:      Appearance: Normal appearance.     Comments: Patient appears altered - wavering while in a seated position, slow to respond, and sedated.   HENT:     Mouth/Throat:     Mouth: Mucous membranes are moist.     Pharynx: Oropharynx is clear.  Eyes:     Extraocular Movements:  Extraocular movements intact.     Pupils: Pupils are equal, round, and reactive to light.  Cardiovascular:     Rate and Rhythm: Normal rate and regular rhythm.     Heart sounds: Normal heart sounds.  Pulmonary:     Effort: Pulmonary effort is normal.     Breath sounds: Normal breath sounds.  Skin:    General: Skin is warm and dry.  Neurological:     Mental Status: He is alert. Mental status is at baseline. He is disoriented.     Cranial Nerves: No cranial nerve deficit.     Motor: No weakness.     Gait: Gait abnormal.  Psychiatric:        Mood and Affect: Mood normal.       No results found for any visits on 10/03/19.  Assessment & Plan     1. Migraine without aura and without status migrainosus, not intractable Recommend CT of brain to rule out stroke, but patient declines. CMA administered Toradol 30MG  deep IM today while in the office. Patient advised to go to ER if symptoms worsens. Patient appears sedated in office which could be due to klonopin, cannot r/o change in mental status due to acute intracranial process.  - ketorolac (TORADOL) injection 30 mg   Return if symptoms worsen or fail to improve.      ITrinna Post, PA-C, have reviewed all documentation for this visit. The documentation on 10/03/19 for the exam, diagnosis, procedures, and orders are all accurate and complete.    Paulene Floor  [facility] [phone] (phone) [phone] (fax)  [place]

## 2019-10-03 NOTE — Patient Instructions (Signed)
Go to ER is symptoms worsen.

## 2019-10-08 DIAGNOSIS — M810 Age-related osteoporosis without current pathological fracture: Secondary | ICD-10-CM | POA: Diagnosis not present

## 2019-10-09 ENCOUNTER — Other Ambulatory Visit: Payer: Self-pay | Admitting: Neurosurgery

## 2019-10-09 DIAGNOSIS — M810 Age-related osteoporosis without current pathological fracture: Secondary | ICD-10-CM | POA: Diagnosis not present

## 2019-10-09 DIAGNOSIS — M419 Scoliosis, unspecified: Secondary | ICD-10-CM | POA: Diagnosis not present

## 2019-10-09 DIAGNOSIS — M5416 Radiculopathy, lumbar region: Secondary | ICD-10-CM | POA: Diagnosis not present

## 2019-10-10 ENCOUNTER — Other Ambulatory Visit: Payer: Self-pay | Admitting: Neurosurgery

## 2019-10-10 ENCOUNTER — Other Ambulatory Visit: Payer: Self-pay | Admitting: Family Medicine

## 2019-10-10 ENCOUNTER — Telehealth: Payer: Self-pay | Admitting: Family Medicine

## 2019-10-10 DIAGNOSIS — M419 Scoliosis, unspecified: Secondary | ICD-10-CM

## 2019-10-10 DIAGNOSIS — M5416 Radiculopathy, lumbar region: Secondary | ICD-10-CM

## 2019-10-10 DIAGNOSIS — G629 Polyneuropathy, unspecified: Secondary | ICD-10-CM

## 2019-10-10 MED ORDER — OXYCODONE-ACETAMINOPHEN 7.5-325 MG PO TABS: 1.0000 | ORAL_TABLET | ORAL | 0 refills | Status: DC | PRN

## 2019-10-10 NOTE — Telephone Encounter (Signed)
Copied from [place] [id number]. Topic: General - Other >> Oct 10, 2019  8:46 AM Leward Quan A wrote: Reason for CRM: Patient called asking to speak with Dr Sabino Snipes nurse say that there are some new developments with the situation of his back and legs that he have been dealing with for years he need to update her on before his upcoming appointment. Patient can be reached at Ph# [phone]

## 2019-10-10 NOTE — Telephone Encounter (Signed)
Medication Refill - Medication: oxyCODONE-acetaminophen (PERCOCET) 7.5-325 MG tablet  Pt would like a refill until he can get in to see dr. Caryn Section on Monday for his leg pain   Has the patient contacted their pharmacy? No. (Agent: If no, request that the patient contact the pharmacy for the refill.) (Agent: If yes, when and what did the pharmacy advise?)  Preferred Pharmacy (with phone number or street name):  [facility] [place], [place]. Phone:  [phone]  Fax:  [phone]       Agent: Please be advised that RX refills may take up to 3 business days. We ask that you follow-up with your pharmacy.

## 2019-10-11 NOTE — Progress Notes (Signed)
Established patient visit   Patient: Lucas Ramirez   DOB: [birth year]   64 y.o. Male  MRN: [id number] Visit Date: 10/14/2019  Today's healthcare provider: Lelon Huh, MD   Chief Complaint  Patient presents with  . Hypertension   Subjective    HPI Hypertension, follow-up  BP Readings from Last 3 Encounters:  10/14/19 (!) 156/80  10/03/19 (!) 166/80  07/04/19 (!) 158/76   Wt Readings from Last 3 Encounters:  10/14/19 161 lb (73 kg)  10/03/19 163 lb (73.9 kg)  07/04/19 168 lb (76.2 kg)     He was last seen for hypertension 4 months ago.  BP at that visit was 123/77. Management since that visit includes ordering labs; patient was advised to continue same medications and drink more water.  He reports good compliance with treatment. He is not having side effects.  He is following a Regular diet. He is not exercising. He does smoke.  Use of agents associated with hypertension: none.   Outside blood pressures are not checked. Symptoms: No chest pain No chest pressure  No palpitations No syncope  No dyspnea No orthopnea  No paroxysmal nocturnal dyspnea Yes lower extremity edema   Pertinent labs: Lab Results  Component Value Date   CHOL 126 06/18/2019   HDL 29 (L) 06/18/2019   LDLCALC 73 06/18/2019   TRIG 133 06/18/2019   CHOLHDL 4.3 06/18/2019   Lab Results  Component Value Date   NA 137 06/18/2019   K 4.6 06/18/2019   CREATININE 1.48 (H) 06/18/2019   GFRNONAA 50 (L) 06/18/2019   GFRAA 57 (L) 06/18/2019   GLUCOSE 97 06/18/2019     The ASCVD Risk score (Goff DC Jr., et al., 2013) failed to calculate for the following reasons:   The valid total cholesterol range is 130 to 320 mg/dL   ---------------------------------------------------------------------------------------------------  Is scheduled for spine decompression surgery in July to help with chronic neuropathy pain in right leg. Will be done by [facility].      Medications: Outpatient Medications  Prior to Visit  Medication Sig  . cholecalciferol (VITAMIN D) 1000 UNITS tablet Take 1,000 Units by mouth daily.  . clonazePAM (KLONOPIN) 1 MG tablet TAKE 1/2 TO 1 TABLET BY MOUTH EVERY 6 HOURS AS NEEDED (Patient taking differently: Take 0.5-1 mg by mouth every 6 (six) hours as needed (migraines). )  . DULoxetine (CYMBALTA) 20 MG capsule Take 20 mg by mouth in the morning and at bedtime. Morning & afternoon  . gabapentin (NEURONTIN) 800 MG tablet TAKE 1 TABLET BY MOUTH 3 TIMES DAILY AS NEEDED AS DIRECTED (Patient taking differently: Take 800 mg by mouth in the morning, at noon, and at bedtime. )  . hydrochlorothiazide (HYDRODIURIL) 25 MG tablet Take 1 tablet (25 mg total) by mouth daily.  Marland Kitchen latanoprost (XALATAN) 0.005 % ophthalmic solution Place 1-2 drops into both eyes See admin instructions. Instill 1 drop into left eye at bedtime Instill 2 drops into right eye at bedtime  . lisinopril (ZESTRIL) 10 MG tablet TAKE 1 TABLET BY MOUTH ONCE DAILY (Patient taking differently: Take 10 mg by mouth daily. )  . omeprazole (PRILOSEC) 20 MG capsule TAKE 1 CAPSULE BY MOUTH ONCE DAILY (Patient taking differently: Take 20 mg by mouth at bedtime. )  . oxyCODONE-acetaminophen (PERCOCET) 7.5-325 MG tablet Take 1 tablet by mouth every 4 (four) hours as needed for severe pain. (Patient taking differently: Take 1 tablet by mouth every 4 (four) hours as needed for severe pain (pain). )  .  propranolol ER (INDERAL LA) 160 MG SR capsule TAKE 1 CAPSULE BY MOUTH ONCE DAILY (Patient taking differently: Take 160 mg by mouth at bedtime. )  . rosuvastatin (CRESTOR) 10 MG tablet Take 1 tablet (10 mg total) by mouth daily.  . SF 5000 PLUS 1.1 % CREA dental cream Place 1 application onto teeth in the morning and at bedtime.  . SUMAtriptan (IMITREX) 100 MG tablet TAKE 1 TABLET BY MOUTH AT ONSET OF HEADACHE MAY TAKE AN ADDITIONAL TABLET IN 2 HOURS IF NEEDED MAX OF 200MG /24HR (Patient taking differently: Take 100 mg by mouth every 2  (two) hours as needed (migraines. (max 2 doses/24 hrs)). )  . timolol (TIMOPTIC) 0.5 % ophthalmic solution Place 1 drop into the right eye daily.   No facility-administered medications prior to visit.    Review of Systems  Constitutional: Negative for appetite change, chills and fever.  Respiratory: Negative for chest tightness, shortness of breath and wheezing.   Cardiovascular: Positive for leg swelling. Negative for chest pain and palpitations.  Gastrointestinal: Negative for abdominal pain, nausea and vomiting.      Objective    BP (!) 156/80 (BP Location: Right Arm, Cuff Size: Normal)   Pulse 72   Temp (!) 97.1 F (36.2 C) (Temporal)   Resp 18   Wt 161 lb (73 kg)   SpO2 96% Comment: room air  BMI 25.99 kg/m    Physical Exam   General: Appearance:     Overweight male in no acute distress  Eyes:    PERRL, conjunctiva/corneas clear, EOM's intact       Lungs:     Clear to auscultation bilaterally, respirations unlabored  Heart:    Normal heart rate. Normal rhythm. No murmurs, rubs, or gallops.   MS:   All extremities are intact.   Neurologic:   Awake, alert, oriented x 3. No apparent focal neurological           defect.         No results found for any visits on 10/14/19.  Assessment & Plan     1. Essential hypertension Double from 10mg  to - lisinopril (ZESTRIL) 20 MG tablet; Take 1 tablet (20 mg total) by mouth daily.  Dispense: 30 tablet; Refill: 1 He is scheduled for back surgery the first week of July. Will return here for BP check in 5-6 weeks after he has had time to re cooperate of for BP to stabilize.       The entirety of the information documented in the History of Present Illness, Review of Systems and Physical Exam were personally obtained by me. Portions of this information were initially documented by the CMA and reviewed by me for thoroughness and accuracy.      Lelon Huh, MD  [facility] [phone] (phone) [phone]  (fax)  [place]

## 2019-10-14 ENCOUNTER — Encounter: Payer: Self-pay | Admitting: Family Medicine

## 2019-10-14 ENCOUNTER — Ambulatory Visit: Payer: BC Managed Care – PPO | Admitting: Family Medicine

## 2019-10-14 ENCOUNTER — Ambulatory Visit: Payer: BC Managed Care – PPO

## 2019-10-14 ENCOUNTER — Other Ambulatory Visit: Payer: Self-pay

## 2019-10-14 VITALS — BP 156/80 | HR 72 | Temp 97.1°F | Resp 18 | Wt 161.0 lb

## 2019-10-14 DIAGNOSIS — I10 Essential (primary) hypertension: Secondary | ICD-10-CM

## 2019-10-14 MED ORDER — LISINOPRIL 20 MG PO TABS: 20.0000 mg | ORAL_TABLET | Freq: Every day | ORAL | 1 refills | Status: DC

## 2019-10-14 NOTE — Patient Instructions (Signed)
•   Please review the attached list of medications and notify my office if there are any errors.    You can take 2 of the 10mg  lisinopril daily until they are gone. Then change to 1 of the 20mg  lisinopril daily

## 2019-10-17 NOTE — Progress Notes (Deleted)
[place]  [phone] [phone]) [phone]  ID: Lucas Ramirez OB: [birth year]  MR#: [id number]  VEH#:[id number]  Patient Care Team: Birdie Sons, MD as PCP - General (Family Medicine)  I connected with Lucas Ramirez on 10/17/19 at  2:00 PM EDT by {Blank single:19197::"video enabled telemedicine visit","telephone visit"} and verified that I am speaking with the correct person using two identifiers.   I discussed the limitations, risks, security and privacy concerns of performing an evaluation and management service by telemedicine and the availability of in-person appointments. I also discussed with the patient that there may be a patient responsible charge related to this service. The patient expressed understanding and agreed to proceed.   Other persons participating in the visit and their role in the encounter: Patient, MD.  Patient's location: Home. Provider's location: Clinic.  CHIEF COMPLAINT: Lymphadenopathy  INTERVAL HISTORY: Patient agreed to video enabled telemedicine visit for further evaluation and discussion of his imaging results.  He continues to feel well and is asymptomatic.  He has no new neurologic complaints.  He denies any recent fevers or illnesses.  He denies any night sweats.  He has a good appetite and denies weight loss.  He has no chest pain, shortness of breath, cough, or hemoptysis.  He denies any nausea, vomiting, constipation, or diarrhea.  He has no urinary complaints.  Patient offers no specific complaints today.  REVIEW OF SYSTEMS:   Review of Systems  Constitutional: Negative.  Negative for diaphoresis, fever, malaise/fatigue and weight loss.  Respiratory: Negative.  Negative for cough, hemoptysis and shortness of breath.   Cardiovascular: Negative.  Negative for chest pain and leg swelling.  Gastrointestinal: Negative.  Negative for abdominal pain.  Genitourinary: Negative.  Negative for dysuria.  Musculoskeletal:  Positive for back pain.  Skin: Negative.  Negative for rash.  Neurological: Negative.  Negative for dizziness, focal weakness, weakness and headaches.  Psychiatric/Behavioral: Negative.  The patient is not nervous/anxious.     As per HPI. Otherwise, a complete review of systems is negative.  PAST MEDICAL HISTORY: Past Medical History:  Diagnosis Date  . CAP (community acquired pneumonia) 06/28/2015  . COPD (chronic obstructive pulmonary disease) ([place])   . Hypertension   . Migraine   . Shingles 2019   dx by dermatologist by patient report    PAST SURGICAL HISTORY: Past Surgical History:  Procedure Laterality Date  . [place]   correcting Scoliosils per pt  . COLONOSCOPY WITH PROPOFOL N/A 07/04/2019   Procedure: COLONOSCOPY WITH PROPOFOL;  Surgeon: Lin Landsman, MD;  Location: [facility] ENDOSCOPY;  Service: Gastroenterology;  Laterality: N/A;  PRIORITY 4  . SKIN LESION EXCISION  06/06/2013   facial left cheek. Dr. Evorn Gong  . UPPER GI ENDOSCOPY  07/11/2008   [facility]. Dr. Donnella Sham. Impression. -LA Grade C reflux esophagitis. 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 Down syndrome Sister   . CAD Neg Hx   . Heart disease Neg Hx   . Colon cancer Neg Hx   . Prostate cancer Neg Hx     ADVANCED DIRECTIVES (Y/N):  N  HEALTH MAINTENANCE: Social History   Tobacco Use  . Smoking status: Current Every Day Smoker    Packs/day: 0.50    Years: 35.00    Pack years: 17.50    Types: Cigarettes  . Smokeless tobacco: Never Used  Substance Use Topics  . Alcohol use: No  .  Drug use: No     Colonoscopy:  PAP:  Bone density:  Lipid panel:  Allergies  Allergen Reactions  . Bupropion Hcl Other (See Comments)    seizures    Current Outpatient Medications  Medication Sig Dispense Refill  . cholecalciferol (VITAMIN D) 1000 UNITS tablet Take 1,000 Units by mouth daily.    .  clonazePAM (KLONOPIN) 1 MG tablet TAKE 1/2 TO 1 TABLET BY MOUTH EVERY 6 HOURS AS NEEDED (Patient taking differently: Take 0.5-1 mg by mouth every 6 (six) hours as needed (migraines). ) 30 tablet 5  . DULoxetine (CYMBALTA) 20 MG capsule Take 20 mg by mouth in the morning and at bedtime. Morning & afternoon    . gabapentin (NEURONTIN) 800 MG tablet TAKE 1 TABLET BY MOUTH 3 TIMES DAILY AS NEEDED AS DIRECTED (Patient taking differently: Take 800 mg by mouth in the morning, at noon, and at bedtime. ) 270 tablet 0  . hydrochlorothiazide (HYDRODIURIL) 25 MG tablet Take 1 tablet (25 mg total) by mouth daily. 90 tablet 1  . latanoprost (XALATAN) 0.005 % ophthalmic solution Place 1-2 drops into both eyes See admin instructions. Instill 1 drop into left eye at bedtime Instill 2 drops into right eye at bedtime    . lisinopril (ZESTRIL) 20 MG tablet Take 1 tablet (20 mg total) by mouth daily. 30 tablet 1  . omeprazole (PRILOSEC) 20 MG capsule TAKE 1 CAPSULE BY MOUTH ONCE DAILY (Patient taking differently: Take 20 mg by mouth at bedtime. ) 30 capsule 11  . oxyCODONE-acetaminophen (PERCOCET) 7.5-325 MG tablet Take 1 tablet by mouth every 4 (four) hours as needed for severe pain. (Patient taking differently: Take 1 tablet by mouth every 4 (four) hours as needed for severe pain (pain). ) 20 tablet 0  . propranolol ER (INDERAL LA) 160 MG SR capsule TAKE 1 CAPSULE BY MOUTH ONCE DAILY (Patient taking differently: Take 160 mg by mouth at bedtime. ) 30 each 12  . rosuvastatin (CRESTOR) 10 MG tablet Take 1 tablet (10 mg total) by mouth daily. 90 tablet 1  . SF 5000 PLUS 1.1 % CREA dental cream Place 1 application onto teeth in the morning and at bedtime.    . SUMAtriptan (IMITREX) 100 MG tablet TAKE 1 TABLET BY MOUTH AT ONSET OF HEADACHE MAY TAKE AN ADDITIONAL TABLET IN 2 HOURS IF NEEDED MAX OF 200MG /24HR (Patient taking differently: Take 100 mg by mouth every 2 (two) hours as needed (migraines. (max 2 doses/24 hrs)). ) 9  tablet 6  . timolol (TIMOPTIC) 0.5 % ophthalmic solution Place 1 drop into the right eye daily.     No current facility-administered medications for this visit.    OBJECTIVE: There were no vitals filed for this visit.   There is no height or weight on file to calculate BMI.    ECOG FS:0 - Asymptomatic  General: Well-developed, well-nourished, no acute distress. HEENT: Normocephalic. Neuro: Alert, answering all questions appropriately. Cranial nerves grossly intact. Psych: Normal affect.  LAB RESULTS:  Lab Results  Component Value Date   NA 137 06/18/2019   K 4.6 06/18/2019   CL 96 06/18/2019   CO2 24 06/18/2019   GLUCOSE 97 06/18/2019   BUN 25 06/18/2019   CREATININE 1.48 (H) 06/18/2019   CALCIUM 9.6 06/18/2019   PROT 6.5 06/18/2019   ALBUMIN 4.3 06/18/2019   AST 14 06/18/2019   ALT 11 06/18/2019   ALKPHOS 72 06/18/2019   BILITOT 0.3 06/18/2019   GFRNONAA 50 (L) 06/18/2019  GFRAA 57 (L) 06/18/2019    Lab Results  Component Value Date   WBC 7.8 04/01/2019   NEUTROABS 4.9 04/01/2019   HGB 14.7 04/01/2019   HCT 45.3 04/01/2019   MCV 90.8 04/01/2019   PLT 261 04/01/2019     STUDIES: No results found.  ASSESSMENT: Lymphadenopathy.  PLAN:   1. Lymphadenopathy: Chronic and unchanged.  CT scan results from April 16, 2019 reviewed independently and report as above.  Patient noted to have enlarged periaortic lymph node on imaging in 2010.  No further intervention is needed. 2.  Renal mass: Imaging results revealed a 1.9 cm exophytic right kidney mass.  Recommendation was to have a dedicated abdominal MRI for further evaluation.  Patient states he does not wish to pursue this at this time secondary to financial reasons, but did agree to repeat MRI and follow-up in 6 months to discuss the results.    I provided *** minutes of {Blank single:19197::"face-to-face video visit time","non face-to-face telephone visit time"} during this encounter which included chart review,  counseling, and coordination of care as documented above.   Patient expressed understanding and was in agreement with this plan. He also understands that He can call clinic at any time with any questions, concerns, or complaints.    Lloyd Huger, MD   10/17/2019 7:25 AM

## 2019-10-18 ENCOUNTER — Other Ambulatory Visit: Payer: Self-pay | Admitting: Family Medicine

## 2019-10-18 DIAGNOSIS — G629 Polyneuropathy, unspecified: Secondary | ICD-10-CM

## 2019-10-18 NOTE — Telephone Encounter (Addendum)
Pt is calling and needs another refill on percocet. [facility] [place]. Pt said he call on Monday for a refill. Pt was only given 20

## 2019-10-18 NOTE — Telephone Encounter (Signed)
Requested medication (s) are due for refill today: yes  Requested medication (s) are on the active medication list: yes  Last refill:  10/10/2019  Future visit scheduled: yes  Notes to clinic: this refill cannot be delegated    Requested Prescriptions  Pending Prescriptions Disp Refills   oxyCODONE-acetaminophen (PERCOCET) 7.5-325 MG tablet 20 tablet 0    Sig: Take 1 tablet by mouth every 4 (four) hours as needed for severe pain.      Not Delegated - Analgesics:  Opioid Agonist Combinations Failed - 10/18/2019  8:32 AM      Failed - This refill cannot be delegated      Failed - Urine Drug Screen completed in last 360 days.      Passed - Valid encounter within last 6 months    Recent Outpatient Visits           4 days ago Essential hypertension   [facility] Birdie Sons, MD   2 weeks ago Migraine without aura and without status migrainosus, not intractable   [place] [place], [place]   4 months ago Essential hypertension   [facility] Birdie Sons, MD   5 months ago Essential hypertension   [facility] Birdie Sons, MD   7 months ago Essential hypertension   [facility] Birdie Sons, MD       Future Appointments             In 4 days [place] Ormond, Kathlene November, MD [facility]   In 1 month Fisher, Kirstie Peri, MD [facility]

## 2019-10-19 MED ORDER — OXYCODONE-ACETAMINOPHEN 7.5-325 MG PO TABS: 1.0000 | ORAL_TABLET | ORAL | 0 refills | Status: DC | PRN

## 2019-10-22 ENCOUNTER — Inpatient Hospital Stay: Payer: BC Managed Care – PPO | Admitting: Oncology

## 2019-10-22 ENCOUNTER — Other Ambulatory Visit: Payer: Self-pay

## 2019-10-22 ENCOUNTER — Encounter
Admission: RE | Admit: 2019-10-22 | Discharge: 2019-10-22 | Disposition: A | Discharge status: Home / Self Care | Payer: BC Managed Care – PPO | Source: Ambulatory Visit | Attending: Neurosurgery | Admitting: Neurosurgery

## 2019-10-22 HISTORY — DX: Unspecified glaucoma: H40.9

## 2019-10-22 HISTORY — DX: Scoliosis, unspecified: M41.9

## 2019-10-22 HISTORY — DX: Hyperlipidemia, unspecified: E78.5

## 2019-10-22 HISTORY — DX: Bell's palsy: G51.0

## 2019-10-22 HISTORY — DX: Depression, unspecified: F32.A

## 2019-10-22 HISTORY — DX: Gastro-esophageal reflux disease without esophagitis: K21.9

## 2019-10-22 NOTE — Patient Instructions (Addendum)
COVID TESTING Date: Tuesday, July 6 Testing site:  [facility] [address] [address] Thru Hours:  3:08 am - 1:00 pm Once you are tested, you are asked to stay quarantined (avoiding public places) until after your surgery.   Your procedure is scheduled on: Wednesday, July 7 Report to Day Surgery on the 2nd floor of the [facility]. To find out your arrival time, please call [phone] between 1PM - 3PM on: Tuesday, July 6  REMEMBER: Instructions that are not followed completely may result in serious medical risk, up to and including death; or upon the discretion of your surgeon and anesthesiologist your surgery may need to be rescheduled.  Do not eat food after midnight the night before surgery.  No gum chewing, lozengers or hard candies.  You may however, drink CLEAR liquids up to 2 hours before you are scheduled to arrive for your surgery. Do not drink anything within 2 hours of your scheduled arrival time.  Clear liquids include: - water  - apple juice without pulp - gatorade (not RED) - black coffee or tea (Do NOT add milk or creamers to the coffee or tea) Do NOT drink anything that is not on this list.  TAKE THESE MEDICATIONS THE MORNING OF SURGERY WITH A SIP OF WATER:  1.  Duloxetine (Cymbalta) 2.  Gabapentin (Neurontin) 3.  Omeprazole (Prilosec) - (take one the night before and one on the morning of surgery - helps to prevent nausea after surgery.) 4.  Timolol eye drop  Stop Anti-inflammatories (NSAIDS) such as Advil, Aleve, Ibuprofen, Motrin, Naproxen, Naprosyn and Aspirin based products such as Excedrin, Goodys Powder, BC Powder. (May take Tylenol or Acetaminophen if needed.)  Stop ANY OVER THE COUNTER supplements until after surgery. (May continue Vitamin D.)  No Alcohol for 24 hours before or after surgery.  No Smoking including e-cigarettes for 24 hours prior to surgery.   On the morning of surgery brush your teeth with toothpaste and  water, you may rinse your mouth with mouthwash if you wish. Do not swallow any toothpaste or mouthwash.  Do not wear jewelry.  Do not wear lotions, powders, or perfumes.   Do not bring valuables to the hospital. [facility] is not responsible for any missing/lost belongings or valuables.   Use CHG Soap as directed on instruction sheet.  Notify your doctor if there is any change in your medical condition (cold, fever, infection).  Wear comfortable clothing (specific to your surgery type) to the hospital.  Plan for stool softeners for home use; pain medications have a tendency to cause constipation. You can also help prevent constipation by eating foods high in fiber such as fruits and vegetables and drinking plenty of fluids as your diet allows.  After surgery, you can help prevent lung complications by doing breathing exercises.  Take deep breaths and cough every 1-2 hours. Your doctor may order a device called an Incentive Spirometer to help you take deep breaths.  If you are being admitted to the hospital overnight, leave your suitcase in the car. After surgery it may be brought to your room.  If you are being discharged the day of surgery, you will not be allowed to drive home. You will need a responsible adult (18 years or older) to drive you home and stay with you that night.   Please call the [place] Dept. at [phone] if you have any questions about these instructions.  Visitation Policy:  Patients undergoing a surgery or  procedure may have one family member or support person with them as long as that person is not COVID-19 positive or experiencing its symptoms.  That person may remain in the waiting area during the procedure.  Inpatient Visitation Update:   Two designated support people may visit a patient during visiting hours 7 am to 8 pm. It must be the same two designated people for the duration of the patient stay. The visitors may come and go  during the day, and there is no switching out to have different visitors. A mask must be worn at all times, including in the patient room.  As a reminder, masks are still required for all [facility] team members, patients and visitors in all [place] facilities.   Systemwide, no visitors 17 or younger.

## 2019-10-24 ENCOUNTER — Other Ambulatory Visit: Payer: Self-pay

## 2019-10-24 ENCOUNTER — Ambulatory Visit
Admission: RE | Admit: 2019-10-24 | Discharge: 2019-10-24 | Disposition: A | Discharge status: Home / Self Care | Payer: BC Managed Care – PPO | Source: Ambulatory Visit | Attending: Neurosurgery | Admitting: Neurosurgery

## 2019-10-24 ENCOUNTER — Other Ambulatory Visit
Admission: RE | Admit: 2019-10-24 | Discharge: 2019-10-24 | Disposition: A | Discharge status: Home / Self Care | Payer: BC Managed Care – PPO | Source: Ambulatory Visit | Attending: Neurosurgery | Admitting: Neurosurgery

## 2019-10-24 DIAGNOSIS — M419 Scoliosis, unspecified: Secondary | ICD-10-CM | POA: Insufficient documentation

## 2019-10-24 DIAGNOSIS — M4184 Other forms of scoliosis, thoracic region: Secondary | ICD-10-CM | POA: Diagnosis not present

## 2019-10-24 DIAGNOSIS — Z136 Encounter for screening for cardiovascular disorders: Secondary | ICD-10-CM | POA: Diagnosis not present

## 2019-10-24 DIAGNOSIS — M4807 Spinal stenosis, lumbosacral region: Secondary | ICD-10-CM | POA: Diagnosis not present

## 2019-10-24 DIAGNOSIS — M5416 Radiculopathy, lumbar region: Secondary | ICD-10-CM | POA: Insufficient documentation

## 2019-10-24 DIAGNOSIS — I700 Atherosclerosis of aorta: Secondary | ICD-10-CM | POA: Diagnosis not present

## 2019-10-24 DIAGNOSIS — M48061 Spinal stenosis, lumbar region without neurogenic claudication: Secondary | ICD-10-CM | POA: Diagnosis not present

## 2019-10-24 DIAGNOSIS — M4185 Other forms of scoliosis, thoracolumbar region: Secondary | ICD-10-CM | POA: Diagnosis not present

## 2019-10-24 LAB — BASIC METABOLIC PANEL
Anion gap: 8 (ref 5–15)
BUN: 42 mg/dL — ABNORMAL HIGH (ref 8–23)
CO2: 30 mmol/L (ref 22–32)
Calcium: 9.3 mg/dL (ref 8.9–10.3)
Chloride: 94 mmol/L — ABNORMAL LOW (ref 98–111)
Creatinine, Ser: 1.8 mg/dL — ABNORMAL HIGH (ref 0.61–1.24)
GFR calc Af Amer: 45 mL/min — ABNORMAL LOW (ref >60)
GFR calc non Af Amer: 39 mL/min — ABNORMAL LOW (ref >60)
Glucose, Bld: 118 mg/dL — ABNORMAL HIGH (ref 70–99)
Potassium: 4.6 mmol/L (ref 3.5–5.1)
Sodium: 132 mmol/L — ABNORMAL LOW (ref 135–145)

## 2019-10-24 LAB — CBC
HCT: 39.4 % (ref 39.0–52.0)
Hemoglobin: 13.2 g/dL (ref 13.0–17.0)
MCH: 30.6 pg (ref 26.0–34.0)
MCHC: 33.5 g/dL (ref 30.0–36.0)
MCV: 91.2 fL (ref 80.0–100.0)
Platelets: 284 10*3/uL (ref 150–400)
RBC: 4.32 MIL/uL (ref 4.22–5.81)
RDW: 13 % (ref 11.5–15.5)
WBC: 9.8 10*3/uL (ref 4.0–10.5)
nRBC: 0.2 % (ref 0.0–0.2)

## 2019-10-24 LAB — TYPE AND SCREEN
ABO/RH(D): O POS
Antibody Screen: NEGATIVE

## 2019-10-24 LAB — URINALYSIS, ROUTINE W REFLEX MICROSCOPIC
Bilirubin Urine: NEGATIVE
Glucose, UA: NEGATIVE mg/dL
Hgb urine dipstick: NEGATIVE
Ketones, ur: NEGATIVE mg/dL
Leukocytes,Ua: NEGATIVE
Nitrite: NEGATIVE
Protein, ur: NEGATIVE mg/dL
Specific Gravity, Urine: 1.02 (ref 1.005–1.030)
pH: 5.5 (ref 5.0–8.0)

## 2019-10-24 LAB — PROTIME-INR
INR: 0.9 (ref 0.8–1.2)
Prothrombin Time: 11.4 seconds (ref 11.4–15.2)

## 2019-10-24 LAB — APTT: aPTT: 28 seconds (ref 24–36)

## 2019-10-24 LAB — SURGICAL PCR SCREEN
MRSA, PCR: NEGATIVE
Staphylococcus aureus: NEGATIVE

## 2019-10-24 NOTE — Progress Notes (Signed)
  [facility] Perioperative Services: Pre-Admission/Anesthesia Testing  Abnormal Lab Notification  Date: 10/24/19  Name: RAUN ROUTH MRN:   [id number]  Re: Abnormal labs noted during PAT appointment  Provider Notified: Meade Maw, MD Notification mode: Faxed via [place]  Labs of concern:  Na 132  BUN 42 and creatinine 1.80 mg/dL. Estimated Creatinine Clearance: 37.9 mL/min (A) (by C-G formula based on SCr of 1.8 mg/dL (H)).   Honor Loh, MSN, APRN, FNP-C, CEN [facility]  Peri-operative Services Nurse Practitioner Phone: [phone] 10/24/19 11:48 AM

## 2019-10-29 ENCOUNTER — Other Ambulatory Visit: Payer: Self-pay

## 2019-10-29 ENCOUNTER — Other Ambulatory Visit
Admission: RE | Admit: 2019-10-29 | Discharge: 2019-10-29 | Disposition: A | Discharge status: Home / Self Care | Payer: BC Managed Care – PPO | Source: Ambulatory Visit | Attending: Neurosurgery | Admitting: Neurosurgery

## 2019-10-29 DIAGNOSIS — Z01812 Encounter for preprocedural laboratory examination: Secondary | ICD-10-CM | POA: Insufficient documentation

## 2019-10-29 DIAGNOSIS — Z20822 Contact with and (suspected) exposure to covid-19: Secondary | ICD-10-CM | POA: Diagnosis not present

## 2019-10-29 LAB — SARS CORONAVIRUS 2 (TAT 6-24 HRS): SARS Coronavirus 2: NEGATIVE

## 2019-10-30 ENCOUNTER — Ambulatory Visit: Payer: BC Managed Care – PPO | Admitting: Certified Registered Nurse Anesthetist

## 2019-10-30 ENCOUNTER — Encounter
Admission: RE | Disposition: A | Discharge status: Home / Self Care | Payer: Self-pay | Source: Ambulatory Visit | Attending: Neurosurgery

## 2019-10-30 ENCOUNTER — Encounter: Payer: Self-pay | Admitting: Neurosurgery

## 2019-10-30 ENCOUNTER — Ambulatory Visit: Payer: BC Managed Care – PPO

## 2019-10-30 ENCOUNTER — Other Ambulatory Visit: Payer: Self-pay

## 2019-10-30 ENCOUNTER — Ambulatory Visit
Admission: RE | Admit: 2019-10-30 | Discharge: 2019-10-30 | Disposition: A | Discharge status: Home / Self Care | Payer: BC Managed Care – PPO | Source: Ambulatory Visit | Attending: Neurosurgery | Admitting: Neurosurgery

## 2019-10-30 DIAGNOSIS — F17210 Nicotine dependence, cigarettes, uncomplicated: Secondary | ICD-10-CM | POA: Insufficient documentation

## 2019-10-30 DIAGNOSIS — E785 Hyperlipidemia, unspecified: Secondary | ICD-10-CM | POA: Diagnosis not present

## 2019-10-30 DIAGNOSIS — H409 Unspecified glaucoma: Secondary | ICD-10-CM | POA: Diagnosis not present

## 2019-10-30 DIAGNOSIS — M419 Scoliosis, unspecified: Secondary | ICD-10-CM | POA: Insufficient documentation

## 2019-10-30 DIAGNOSIS — J449 Chronic obstructive pulmonary disease, unspecified: Secondary | ICD-10-CM | POA: Insufficient documentation

## 2019-10-30 DIAGNOSIS — I10 Essential (primary) hypertension: Secondary | ICD-10-CM | POA: Insufficient documentation

## 2019-10-30 DIAGNOSIS — G510 Bell's palsy: Secondary | ICD-10-CM | POA: Diagnosis not present

## 2019-10-30 DIAGNOSIS — M5416 Radiculopathy, lumbar region: Secondary | ICD-10-CM | POA: Insufficient documentation

## 2019-10-30 DIAGNOSIS — Z87891 Personal history of nicotine dependence: Secondary | ICD-10-CM | POA: Insufficient documentation

## 2019-10-30 DIAGNOSIS — J441 Chronic obstructive pulmonary disease with (acute) exacerbation: Secondary | ICD-10-CM | POA: Diagnosis not present

## 2019-10-30 DIAGNOSIS — Z791 Long term (current) use of non-steroidal anti-inflammatories (NSAID): Secondary | ICD-10-CM | POA: Diagnosis not present

## 2019-10-30 DIAGNOSIS — F329 Major depressive disorder, single episode, unspecified: Secondary | ICD-10-CM | POA: Insufficient documentation

## 2019-10-30 DIAGNOSIS — Z419 Encounter for procedure for purposes other than remedying health state, unspecified: Secondary | ICD-10-CM

## 2019-10-30 DIAGNOSIS — K219 Gastro-esophageal reflux disease without esophagitis: Secondary | ICD-10-CM | POA: Diagnosis not present

## 2019-10-30 DIAGNOSIS — Z981 Arthrodesis status: Secondary | ICD-10-CM | POA: Diagnosis not present

## 2019-10-30 HISTORY — PX: LUMBAR LAMINECTOMY/ DECOMPRESSION WITH MET-RX: SHX5959

## 2019-10-30 LAB — ABO/RH: ABO/RH(D): O POS

## 2019-10-30 SURGERY — LUMBAR LAMINECTOMY/ DECOMPRESSION WITH MET-RX
Anesthesia: General | Laterality: Right

## 2019-10-30 MED ORDER — LIDOCAINE HCL 4 % MT SOLN
OROMUCOSAL | Status: DC | PRN
  Administered 2019-10-30: 4 mL via TOPICAL

## 2019-10-30 MED ORDER — SODIUM CHLORIDE 0.9 % IR SOLN
Status: DC | PRN
  Administered 2019-10-30: 1000 mL

## 2019-10-30 MED ORDER — PROPOFOL 10 MG/ML IV BOLUS
INTRAVENOUS | Status: DC | PRN
  Administered 2019-10-30: 150 mg via INTRAVENOUS

## 2019-10-30 MED ORDER — ONDANSETRON HCL 4 MG/2ML IJ SOLN
INTRAMUSCULAR | Status: DC | PRN
  Administered 2019-10-30: 4 mg via INTRAVENOUS

## 2019-10-30 MED ORDER — PHENYLEPHRINE HCL (PRESSORS) 10 MG/ML IV SOLN
INTRAVENOUS | Status: DC | PRN
  Administered 2019-10-30 (×2): 100 ug via INTRAVENOUS
  Administered 2019-10-30 (×2): 200 ug via INTRAVENOUS

## 2019-10-30 MED ORDER — OXYCODONE HCL 5 MG PO TABS: 5.0000 mg | ORAL_TABLET | ORAL | 0 refills | Status: DC | PRN

## 2019-10-30 MED ORDER — ACETAMINOPHEN 10 MG/ML IV SOLN
INTRAVENOUS | Status: DC | PRN
  Administered 2019-10-30: 1000 mg via INTRAVENOUS

## 2019-10-30 MED ORDER — DEXAMETHASONE SODIUM PHOSPHATE 10 MG/ML IJ SOLN
INTRAMUSCULAR | Status: DC | PRN
  Administered 2019-10-30: 10 mg via INTRAVENOUS

## 2019-10-30 MED ORDER — ONDANSETRON HCL 4 MG/2ML IJ SOLN: 4.0000 mg | Freq: Once | INTRAMUSCULAR | Status: DC | PRN

## 2019-10-30 MED ORDER — LACTATED RINGERS IV SOLN: INTRAVENOUS | Status: DC

## 2019-10-30 MED ORDER — PHENYLEPHRINE HCL-NACL 10-0.9 MG/250ML-% IV SOLN
INTRAVENOUS | Status: DC | PRN
  Administered 2019-10-30: 50 ug/min via INTRAVENOUS

## 2019-10-30 MED ORDER — ONDANSETRON HCL 4 MG/2ML IJ SOLN
INTRAMUSCULAR | Status: AC
  Filled 2019-10-30: qty 2

## 2019-10-30 MED ORDER — ACETAMINOPHEN 500 MG PO TABS
500.0000 mg | ORAL_TABLET | Freq: Four times a day (QID) | ORAL | 0 refills | Status: DC | PRN
Start: 2019-10-30 — End: 2022-08-19

## 2019-10-30 MED ORDER — ORAL CARE MOUTH RINSE: 15.0000 mL | Freq: Once | OROMUCOSAL | Status: AC

## 2019-10-30 MED ORDER — FENTANYL CITRATE (PF) 250 MCG/5ML IJ SOLN
INTRAMUSCULAR | Status: AC
  Filled 2019-10-30: qty 5

## 2019-10-30 MED ORDER — CHLORHEXIDINE GLUCONATE 0.12 % MT SOLN
OROMUCOSAL | Status: AC
  Administered 2019-10-30: 15 mL via OROMUCOSAL
  Filled 2019-10-30: qty 15

## 2019-10-30 MED ORDER — LIDOCAINE HCL (PF) 2 % IJ SOLN
INTRAMUSCULAR | Status: AC
  Filled 2019-10-30: qty 5

## 2019-10-30 MED ORDER — MIDAZOLAM HCL 2 MG/2ML IJ SOLN
INTRAMUSCULAR | Status: AC
  Filled 2019-10-30: qty 2

## 2019-10-30 MED ORDER — BUPIVACAINE-EPINEPHRINE (PF) 0.5% -1:200000 IJ SOLN
INTRAMUSCULAR | Status: DC | PRN
  Administered 2019-10-30: 2 mL

## 2019-10-30 MED ORDER — ROCURONIUM BROMIDE 100 MG/10ML IV SOLN
INTRAVENOUS | Status: DC | PRN
  Administered 2019-10-30: 10 mg via INTRAVENOUS

## 2019-10-30 MED ORDER — MIDAZOLAM HCL 2 MG/2ML IJ SOLN
INTRAMUSCULAR | Status: DC | PRN
  Administered 2019-10-30: 2 mg via INTRAVENOUS

## 2019-10-30 MED ORDER — CHLORHEXIDINE GLUCONATE 0.12 % MT SOLN: 15.0000 mL | Freq: Once | OROMUCOSAL | Status: AC

## 2019-10-30 MED ORDER — SUCCINYLCHOLINE CHLORIDE 20 MG/ML IJ SOLN
INTRAMUSCULAR | Status: DC | PRN
  Administered 2019-10-30: 100 mg via INTRAVENOUS

## 2019-10-30 MED ORDER — SODIUM CHLORIDE 0.9 % IV SOLN
INTRAVENOUS | Status: DC | PRN
  Administered 2019-10-30: 40 mL

## 2019-10-30 MED ORDER — ROCURONIUM BROMIDE 10 MG/ML (PF) SYRINGE
PREFILLED_SYRINGE | INTRAVENOUS | Status: AC
  Filled 2019-10-30: qty 10

## 2019-10-30 MED ORDER — FENTANYL CITRATE (PF) 250 MCG/5ML IJ SOLN
INTRAMUSCULAR | Status: DC | PRN
  Administered 2019-10-30: 50 ug via INTRAVENOUS

## 2019-10-30 MED ORDER — ACETAMINOPHEN 10 MG/ML IV SOLN
INTRAVENOUS | Status: AC
  Filled 2019-10-30: qty 100

## 2019-10-30 MED ORDER — METHYLPREDNISOLONE ACETATE 40 MG/ML IJ SUSP
INTRAMUSCULAR | Status: DC | PRN
  Administered 2019-10-30: 40 mg

## 2019-10-30 MED ORDER — FENTANYL CITRATE (PF) 100 MCG/2ML IJ SOLN: 25.0000 ug | INTRAMUSCULAR | Status: DC | PRN

## 2019-10-30 MED ORDER — DEXAMETHASONE SODIUM PHOSPHATE 10 MG/ML IJ SOLN
INTRAMUSCULAR | Status: AC
  Filled 2019-10-30: qty 1

## 2019-10-30 MED ORDER — THROMBIN 5000 UNITS EX SOLR
CUTANEOUS | Status: DC | PRN
  Administered 2019-10-30: 5000 [IU] via TOPICAL

## 2019-10-30 MED ORDER — LIDOCAINE HCL (CARDIAC) PF 100 MG/5ML IV SOSY
PREFILLED_SYRINGE | INTRAVENOUS | Status: DC | PRN
  Administered 2019-10-30: 60 mg via INTRAVENOUS

## 2019-10-30 MED ORDER — EPHEDRINE SULFATE 50 MG/ML IJ SOLN
INTRAMUSCULAR | Status: DC | PRN
  Administered 2019-10-30: 10 mg via INTRAVENOUS

## 2019-10-30 MED ORDER — PROPOFOL 10 MG/ML IV BOLUS
INTRAVENOUS | Status: AC
  Filled 2019-10-30: qty 20

## 2019-10-30 MED ORDER — SUCCINYLCHOLINE CHLORIDE 200 MG/10ML IV SOSY
PREFILLED_SYRINGE | INTRAVENOUS | Status: AC
  Filled 2019-10-30: qty 10

## 2019-10-30 MED ORDER — KETAMINE HCL 50 MG/ML IJ SOLN
INTRAMUSCULAR | Status: DC | PRN
Start: 2019-10-30 — End: 2019-10-30
  Administered 2019-10-30: 50 mg via INTRAMUSCULAR

## 2019-10-30 MED ORDER — CEFAZOLIN SODIUM-DEXTROSE 2-4 GM/100ML-% IV SOLN
2.0000 g | INTRAVENOUS | Status: AC
  Administered 2019-10-30: 2 g via INTRAVENOUS

## 2019-10-30 MED ORDER — CEFAZOLIN SODIUM-DEXTROSE 2-4 GM/100ML-% IV SOLN
INTRAVENOUS | Status: AC
  Filled 2019-10-30: qty 100

## 2019-10-30 MED ORDER — BUPIVACAINE HCL 0.5 % IJ SOLN
INTRAMUSCULAR | Status: DC | PRN
  Administered 2019-10-30: 30 mL

## 2019-10-30 MED ORDER — METHOCARBAMOL 500 MG PO TABS: 500.0000 mg | ORAL_TABLET | Freq: Four times a day (QID) | ORAL | 0 refills | Status: AC | PRN

## 2019-10-30 SURGICAL SUPPLY — 59 items
BUR NEURO DRILL SOFT 3.0X3.8M (BURR) ×3 IMPLANT
CANISTER SUCT 1200ML W/VALVE (MISCELLANEOUS) ×6 IMPLANT
CHLORAPREP W/TINT 26 (MISCELLANEOUS) ×6 IMPLANT
CNTNR SPEC 2.5X3XGRAD LEK (MISCELLANEOUS) ×1
CONT SPEC 4OZ STER OR WHT (MISCELLANEOUS) ×2
CONT SPEC 4OZ STRL OR WHT (MISCELLANEOUS) ×1
CONTAINER SPEC 2.5X3XGRAD LEK (MISCELLANEOUS) ×1 IMPLANT
COUNTER NEEDLE 20/40 LG (NEEDLE) ×3 IMPLANT
COVER LIGHT HANDLE STERIS (MISCELLANEOUS) ×6 IMPLANT
COVER WAND RF STERILE (DRAPES) ×3 IMPLANT
CUP MEDICINE 2OZ PLAST GRAD ST (MISCELLANEOUS) ×4 IMPLANT
DERMABOND ADVANCED (GAUZE/BANDAGES/DRESSINGS) ×2
DERMABOND ADVANCED .7 DNX12 (GAUZE/BANDAGES/DRESSINGS) ×1 IMPLANT
DRAPE C-ARM 42X72 X-RAY (DRAPES) ×10 IMPLANT
DRAPE LAPAROTOMY 100X77 ABD (DRAPES) ×3 IMPLANT
DRAPE MICROSCOPE SPINE 48X150 (DRAPES) ×3 IMPLANT
DRAPE SURG 17X11 SM STRL (DRAPES) ×12 IMPLANT
ELECT CAUTERY BLADE TIP 2.5 (TIP) ×3
ELECT EZSTD 165MM 6.5IN (MISCELLANEOUS)
ELECT REM PT RETURN 9FT ADLT (ELECTROSURGICAL) ×3
ELECTRODE CAUTERY BLDE TIP 2.5 (TIP) ×1 IMPLANT
ELECTRODE EZSTD 165MM 6.5IN (MISCELLANEOUS) IMPLANT
ELECTRODE REM PT RTRN 9FT ADLT (ELECTROSURGICAL) ×1 IMPLANT
FRAME EYE SHIELD (PROTECTIVE WEAR) ×6 IMPLANT
GLOVE BIOGEL PI IND STRL 7.0 (GLOVE) ×1 IMPLANT
GLOVE BIOGEL PI INDICATOR 7.0 (GLOVE) ×2
GLOVE SURG SYN 7.0 (GLOVE) ×6 IMPLANT
GLOVE SURG SYN 7.0 PF PI (GLOVE) ×2 IMPLANT
GLOVE SURG SYN 8.5  E (GLOVE) ×9
GLOVE SURG SYN 8.5 E (GLOVE) ×3 IMPLANT
GLOVE SURG SYN 8.5 PF PI (GLOVE) ×3 IMPLANT
GOWN SRG XL LVL 3 NONREINFORCE (GOWNS) ×1 IMPLANT
GOWN STRL NON-REIN TWL XL LVL3 (GOWNS) ×3
GOWN STRL REUS W/ TWL XL LVL3 (GOWN DISPOSABLE) ×1 IMPLANT
GOWN STRL REUS W/TWL XL LVL3 (GOWN DISPOSABLE) ×3
GRADUATE 1200CC STRL 31836 (MISCELLANEOUS) ×3 IMPLANT
KIT SPINAL PRONEVIEW (KITS) ×3 IMPLANT
KNIFE BAYONET SHORT DISCETOMY (MISCELLANEOUS) IMPLANT
MARKER SKIN DUAL TIP RULER LAB (MISCELLANEOUS) ×3 IMPLANT
NDL SAFETY ECLIPSE 18X1.5 (NEEDLE) ×1 IMPLANT
NEEDLE HYPO 18GX1.5 SHARP (NEEDLE) ×3
NEEDLE HYPO 22GX1.5 SAFETY (NEEDLE) ×3 IMPLANT
NS IRRIG 1000ML POUR BTL (IV SOLUTION) ×3 IMPLANT
PACK LAMINECTOMY NEURO (CUSTOM PROCEDURE TRAY) ×3 IMPLANT
PAD ARMBOARD 7.5X6 YLW CONV (MISCELLANEOUS) ×3 IMPLANT
SPOGE SURGIFLO 8M (HEMOSTASIS) ×3
SPONGE SURGIFLO 8M (HEMOSTASIS) ×1 IMPLANT
SUT DVC VLOC 3-0 CL 6 P-12 (SUTURE) ×3 IMPLANT
SUT VIC AB 0 CT1 27 (SUTURE) ×3
SUT VIC AB 0 CT1 27XCR 8 STRN (SUTURE) ×1 IMPLANT
SUT VIC AB 2-0 CT1 18 (SUTURE) ×3 IMPLANT
SYR 10ML LL (SYRINGE) ×3 IMPLANT
SYR 20ML LL LF (SYRINGE) ×3 IMPLANT
SYR 30ML LL (SYRINGE) ×6 IMPLANT
SYR 3ML LL SCALE MARK (SYRINGE) ×3 IMPLANT
TOWEL OR 17X26 4PK STRL BLUE (TOWEL DISPOSABLE) ×9 IMPLANT
TUBE METRX 18MMX5CM (INSTRUMENTS) ×2 IMPLANT
TUBING CONNECTING 10 (TUBING) ×2 IMPLANT
TUBING CONNECTING 10' (TUBING) ×1

## 2019-10-30 NOTE — Discharge Instructions (Addendum)
Bupivacaine Liposomal Suspension for Injection What is this medicine? BUPIVACAINE LIPOSOMAL (bue PIV a kane LIP oh som al) is an anesthetic. It causes loss of feeling in the skin or other tissues. It is used to prevent and to treat pain from some procedures. This medicine may be used for other purposes; ask your health care provider or pharmacist if you have questions. COMMON BRAND NAME(S): EXPAREL What should I tell my health care provider before I take this medicine? They need to know if you have any of these conditions:  G6PD deficiency  heart disease  kidney disease  liver disease  low blood pressure  lung or breathing disease, like asthma  an unusual or allergic reaction to bupivacaine, other medicines, foods, dyes, or preservatives  pregnant or trying to get pregnant  breast-feeding How should I use this medicine? This medicine is for injection into the affected area. It is given by a health care professional in a hospital or clinic setting. Talk to your pediatrician regarding the use of this medicine in children. Special care may be needed. Overdosage: If you think you have taken too much of this medicine contact a poison control center or emergency room at once. NOTE: This medicine is only for you. Do not share this medicine with others. What if I miss a dose? This does not apply. What may interact with this medicine? This medicine may interact with the following medications:  acetaminophen  certain antibiotics like dapsone, nitrofurantoin, aminosalicylic acid, sulfonamides  certain medicines for seizures like phenobarbital, phenytoin, valproic acid  chloroquine  cyclophosphamide  flutamide  hydroxyurea  ifosfamide  metoclopramide  nitric oxide  nitroglycerin  nitroprusside  nitrous oxide  other local anesthetics like lidocaine, pramoxine, tetracaine  primaquine  quinine  rasburicase  sulfasalazine This list may not describe all  possible interactions. Give your health care provider a list of all the medicines, herbs, non-prescription drugs, or dietary supplements you use. Also tell them if you smoke, drink alcohol, or use illegal drugs. Some items may interact with your medicine. What should I watch for while using this medicine? Your condition will be monitored carefully while you are receiving this medicine. Be careful to avoid injury while the area is numb, and you are not aware of pain. What side effects may I notice from receiving this medicine? Side effects that you should report to your doctor or health care professional as soon as possible:  allergic reactions like skin rash, itching or hives, swelling of the face, lips, or tongue  seizures  signs and symptoms of a dangerous change in heartbeat or heart rhythm like chest pain; dizziness; fast, irregular heartbeat; palpitations; feeling faint or lightheaded; falls; breathing problems  signs and symptoms of methemoglobinemia such as pale, gray, or blue colored skin; headache; fast heartbeat; shortness of breath; feeling faint or lightheaded, falls; tiredness Side effects that usually do not require medical attention (report to your doctor or health care professional if they continue or are bothersome):  anxious  back pain  changes in taste  changes in vision  constipation  dizziness  fever  nausea, vomiting This list may not describe all possible side effects. Call your doctor for medical advice about side effects. You may report side effects to FDA at 1-800-FDA-1088. Where should I keep my medicine? This drug is given in a hospital or clinic and will not be stored at home. NOTE: This sheet is a summary. It may not cover all possible information. If you have questions about  this medicine, talk to your doctor, pharmacist, or health care provider.  2020 Elsevier/Gold Standard (2019-01-22 10:48:23)     AMBULATORY SURGERY  DISCHARGE  INSTRUCTIONS   1) The drugs that you were given will stay in your system until tomorrow so for the next 24 hours you should not:  A) Drive an automobile B) Make any legal decisions C) Drink any alcoholic beverage   2) You may resume regular meals tomorrow.  Today it is better to start with liquids and gradually work up to solid foods.  You may eat anything you prefer, but it is better to start with liquids, then soup and crackers, and gradually work up to solid foods.   3) Please notify your doctor immediately if you have any unusual bleeding, trouble breathing, redness and pain at the surgery site, drainage, fever, or pain not relieved by medication.    4) Additional Instructions:        Please contact your physician with any problems or Same Day Surgery at [phone], Monday through Friday 6 am to 4 pm, or  at [facility] number at [phone]. Your surgeon has performed an operation on your lumbar spine (low back) to relieve pressure on one or more nerves. Many times, patients feel better immediately after surgery and can "overdo it." Even if you feel well, it is important that you follow these activity guidelines. If you do not let your back heal properly from the surgery, you can increase the chance of a disc herniation and/or return of your symptoms. The following are instructions to help in your recovery once you have been discharged from the hospital.  * Do not take anti-inflammatory medications for 3 days after surgery (naproxen [Aleve], ibuprofen [Advil, Motrin], celecoxib [Celebrex], etc.)  Activity    No bending, lifting, or twisting ("BLT"). Avoid lifting objects heavier than 10 pounds (gallon milk jug).  Where possible, avoid household activities that involve lifting, bending, pushing, or pulling such as laundry, vacuuming, grocery shopping, and childcare. Try to arrange for help from friends and family for these activities while your back  heals.  Increase physical activity slowly as tolerated.  Taking short walks is encouraged, but avoid strenuous exercise. Do not jog, run, bicycle, lift weights, or participate in any other exercises unless specifically allowed by your doctor. Avoid prolonged sitting, including car rides.  Talk to your doctor before resuming sexual activity.  You should not drive until cleared by your doctor.  Until released by your doctor, you should not return to work or school.  You should rest at home and let your body heal.   You may shower two days after your surgery.  After showering, lightly dab your incision dry. Do not take a tub bath or go swimming for 3 weeks, or until approved by your doctor at your follow-up appointment.  If you smoke, we strongly recommend that you quit.  Smoking has been proven to interfere with normal healing in your back and will dramatically reduce the success rate of your surgery. Please contact [facility] (800-QUIT-NOW) and use the resources at www.[facility].com for assistance in stopping smoking.  Surgical Incision   If you have staples or stitches on your incision, you should have a follow up scheduled for removal. If you do not have staples or stitches, you will have steri-strips (small pieces of surgical tape) or Dermabond glue. The steri-strips/glue should begin to peel away within about a week (it is fine if the steri-strips fall off before then). If the  strips are still in place one week after your surgery, you may gently remove them.  Diet            You may return to your usual diet. Be sure to stay hydrated.  When to Contact us  Although your surgery and recovery will likely be uneventful, you may have some residual numbness, aches, and pains in your back and/or legs. This is normal and should improve in the next few weeks.  However, should you experience any of the following, contact us immediately: . New numbness or weakness . Pain that is progressively  getting worse, and is not relieved by your pain medications or rest . Bleeding, redness, swelling, pain, or drainage from surgical incision . Chills or flu-like symptoms . Fever greater than 101.0 F (38.3 C) . Problems with bowel or bladder functions . Difficulty breathing or shortness of breath . Warmth, tenderness, or swelling in your calf  Contact Information . During office hours (Monday-Friday 9 am to 5 pm), please call your physician at [phone] . After hours and weekends, please call [phone] and an answering service will put you in touch with either Dr. Lacinda Axon or Dr. Izora Ribas.  . For a life-threatening emergency, call 911

## 2019-10-30 NOTE — Anesthesia Postprocedure Evaluation (Signed)
Anesthesia Post Note  Patient: [employer]  Procedure(s) Performed: L4-5 DECOMPRESSION (Right )  Patient location during evaluation: PACU Anesthesia Type: General Level of consciousness: awake and alert Pain management: pain level controlled Vital Signs Assessment: post-procedure vital signs reviewed and stable Respiratory status: spontaneous breathing, nonlabored ventilation, respiratory function stable and patient connected to nasal cannula oxygen Cardiovascular status: blood pressure returned to baseline and stable Postop Assessment: no apparent nausea or vomiting Anesthetic complications: no   No complications documented.   Last Vitals:  Vitals:   10/30/19 1649 10/30/19 1745  BP:  135/70  Pulse:  70  Resp:  16  Temp: 36.6 C 36.4 C  SpO2:      Last Pain:  Vitals:   10/30/19 1745  TempSrc:   PainSc: 0-No pain                 Martha Clan

## 2019-10-30 NOTE — Op Note (Signed)
Indications: Lucas Ramirez is a 64 yo male who presented with lumbar radiculopathy.  He failed conservative management.  Findings: severe scoliosis  Preoperative Diagnosis: Lumbar radiculopathyPostoperative Diagnosis: same   EBL: 25 ml IVF: 750 ml Drains: none Disposition: Extubated and Stable to PACU Complications: none  No foley catheter was placed.   Preoperative Note:   Risks of surgery discussed include: infection, bleeding, stroke, coma, death, paralysis, CSF leak, nerve/spinal cord injury, numbness, tingling, weakness, complex regional pain syndrome, recurrent stenosis and/or disc herniation, vascular injury, development of instability, neck/back pain, need for further surgery, persistent symptoms, development of deformity, and the risks of anesthesia. The patient understood these risks and agreed to proceed.  Operative Note:   1. L4-5 lumbar decompression including central laminectomy and right facetectomy and foraminotomy  The patient was then brought from the preoperative center with intravenous access established.  The patient underwent general anesthesia and endotracheal tube intubation, and was then rotated on the Ohlman rail top where all pressure points were appropriately padded.  The skin was then thoroughly cleansed.  Perioperative antibiotic prophylaxis was administered.  Sterile prep and drapes were then applied and a timeout was then observed.  C-arm was brought into the field under sterile conditions and under lateral visualization the L4-5 interspace was identified and marked.  The incision was marked on the right and injected with local anesthetic. Once this was complete a 2 cm incision was opened with the use of a #10 blade knife.    The metrx tubes were sequentially advanced and confirmed in position at L4-5. An 60mm by 49mm tube was locked in place to the bed side attachment.  The microscope was then sterilely brought into the field and muscle creep was hemostased  with a bipolar and resected with a pituitary rongeur.  A Bovie extender was then used to expose the spinous process and lamina.  Careful attention was placed to not violate the facet capsule. A 3 mm matchstick drill bit was then used to make a hemi-laminotomy trough until the ligamentum flavum was exposed.  This was extended to the base of the spinous process and to the contralateral side to remove all the central bone from each side.     Due to severe scoliosis, identification of the appropriate anatomy was very difficult and required substantially more time than normal.      Once this was complete and the underlying ligamentum flavum was visualized, it was dissected with a curette and resected with Kerrison rongeurs.  Extensive ligamentum hypertrophy was noted, requiring a substantial amount of time and care for removal.  The dura was identified and palpated. The kerrison rongeur was then used to remove the medial facet bilaterally until no compression was noted.  A balltip probe was used to confirm decompression of the ipsilateral L4 and L5 nerve roots.  No CSF leak was noted.  A Depo-Medrol soaked Gelfoam pledget was placed in the defect.  The wound was copiously irrigated. The tube system was then removed under microscopic visualization and hemostasis was obtained with a bipolar.    The fascial layer was reapproximated with the use of a 0 Vicryl suture.  Subcutaneous tissue layer was reapproximated using 2-0 Vicryl suture.  3-0 monocryl was placed in subcuticular fashion. The skin was then cleansed and Dermabond was used to close the skin opening.  Patient was then rotated back to the preoperative bed awakened from anesthesia and taken to recovery all counts are correct in this case.  I performed the entire  procedure with the assistance of Lonell Face NP as an Pensions consultant.  Asheton Viramontes K. Izora Ribas MD

## 2019-10-30 NOTE — H&P (Signed)
I have reviewed and confirmed my history and physical from 10/09/19 with no additions or changes. Plan for L4-5 decompression.  Risks and benefits reviewed.  Heart sounds normal no MRG. Chest Clear to Auscultation Bilaterally.

## 2019-10-30 NOTE — Discharge Summary (Signed)
Procedure: L4-5 lumbar decompression Procedure date: 10/30/2019 Diagnosis: Lumbar radiculopathy   History: Lucas Ramirez is s/p L4-5 lumbar decompression POD0: Tolerated procedure well. Evaluated in post op recovery still disoriented from anesthesia but able to answer questions and obey commands.   Physical Exam: Vitals:   10/30/19 1157  BP: (!) 150/76  Pulse: 61  Resp: 14  Temp: 99.1 F (37.3 C)  SpO2: 99%    General: Alert and oriented, lying in bed Strength:5/5 throughout lower extremities Sensation: intact and symmetric throughout Skin: incision clean, dry, intact  Data:  Recent Labs  Lab 10/24/19 0913  NA 132*  K 4.6  CL 94*  CO2 30  BUN 42*  CREATININE 1.80*  GLUCOSE 118*  CALCIUM 9.3   No results for input(s): AST, ALT, ALKPHOS in the last 168 hours.  Invalid input(s): TBILI   Recent Labs  Lab 10/24/19 0913  WBC 9.8  HGB 13.2  HCT 39.4  PLT 284   Recent Labs  Lab 10/24/19 0913  APTT 28  INR 0.9          Assessment/Plan:  Lucas Ramirez is POD0 s/p L4-5 lumbar decompression.   Once he is able to ambulate, void, and tolerate some PO, he is able to be discharged to home.   He will follow up in two weeks in the Neurosurgery clinic.   Lonell Face, NP Department of Neurosurgery

## 2019-10-30 NOTE — Anesthesia Preprocedure Evaluation (Signed)
Anesthesia Evaluation  Patient identified by MRN, date of birth, ID band Patient awake    Reviewed: Allergy & Precautions, NPO status , Patient's Chart, lab work & pertinent test results  History of Anesthesia Complications Negative for: history of anesthetic complications  Airway Mallampati: II       Dental   Pulmonary neg sleep apnea, COPD, Current Smoker,           Cardiovascular hypertension, Pt. on medications (-) Past MI and (-) CHF (-) dysrhythmias (-) Valvular Problems/Murmurs     Neuro/Psych neg Seizures Depression  Neuromuscular disease (Bell's Palsy, L lip droop.)    GI/Hepatic Neg liver ROS, GERD  Medicated and Controlled,  Endo/Other  neg diabetes  Renal/GU negative Renal ROS     Musculoskeletal   Abdominal   Peds  Hematology   Anesthesia Other Findings   Reproductive/Obstetrics                             Anesthesia Physical Anesthesia Plan  ASA: III  Anesthesia Plan: General   Post-op Pain Management:    Induction: Intravenous  PONV Risk Score and Plan: 1 and Ondansetron  Airway Management Planned: Oral ETT  Additional Equipment:   Intra-op Plan:   Post-operative Plan:   Informed Consent: I have reviewed the patients History and Physical, chart, labs and discussed the procedure including the risks, benefits and alternatives for the proposed anesthesia with the patient or authorized representative who has indicated his/her understanding and acceptance.       Plan Discussed with:   Anesthesia Plan Comments:         Anesthesia Quick Evaluation

## 2019-10-30 NOTE — Transfer of Care (Signed)
Immediate Anesthesia Transfer of Care Note  Patient: Lucas Ramirez  Procedure(s) Performed: L4-5 DECOMPRESSION (Right )  Patient Location: PACU  Anesthesia Type:General  Level of Consciousness: sedated  Airway & Oxygen Therapy: Patient Spontanous Breathing and Patient connected to face mask oxygen  Post-op Assessment: Report given to RN and Post -op Vital signs reviewed and stable  Post vital signs: Reviewed and stable  Last Vitals:  Vitals Value Taken Time  BP 141/77 10/30/19 1630  Temp 36.1 C 10/30/19 1638  Pulse 70 10/30/19 1639  Resp 16 10/30/19 1639  SpO2 100 % 10/30/19 1639  Vitals shown include unvalidated device data.  Last Pain:  Vitals:   10/30/19 1630  TempSrc:   PainSc: 0-No pain         Complications: No complications documented.

## 2019-10-30 NOTE — Anesthesia Procedure Notes (Signed)
Procedure Name: Intubation Date/Time: 10/30/2019 1:54 PM Performed by: Eben Burow, CRNA Pre-anesthesia Checklist: Patient identified, Emergency Drugs available, Patient being monitored and Suction available Patient Re-evaluated:Patient Re-evaluated prior to induction Oxygen Delivery Method: Circle system utilized Preoxygenation: Pre-oxygenation with 100% oxygen Induction Type: IV induction Ventilation: Mask ventilation without difficulty Laryngoscope Size: Miller and 2 Grade View: Grade I Tube type: Oral Tube size: 7.5 mm Number of attempts: 1 Airway Equipment and Method: Stylet and LTA kit utilized Placement Confirmation: ETT inserted through vocal cords under direct vision,  positive ETCO2 and breath sounds checked- equal and bilateral Secured at: 22 cm Tube secured with: Tape Dental Injury: Teeth and Oropharynx as per pre-operative assessment

## 2019-10-30 NOTE — Progress Notes (Signed)
Pharmacy consult for Cefazolin dosing  64 yo M wt 74 kg  Crcl 37.9 ml/min  Will order Cefazolin 2 gm IV x 1 dose pre-op  Chinita Greenland PharmD Clinical Pharmacist 10/30/2019

## 2019-10-31 ENCOUNTER — Encounter: Payer: Self-pay | Admitting: Neurosurgery

## 2019-11-06 ENCOUNTER — Other Ambulatory Visit: Payer: Self-pay | Admitting: Family Medicine

## 2019-11-06 ENCOUNTER — Ambulatory Visit: Payer: BC Managed Care – PPO

## 2019-11-06 DIAGNOSIS — I10 Essential (primary) hypertension: Secondary | ICD-10-CM

## 2019-11-11 ENCOUNTER — Inpatient Hospital Stay: Payer: BC Managed Care – PPO | Admitting: Oncology

## 2019-11-12 DIAGNOSIS — M419 Scoliosis, unspecified: Secondary | ICD-10-CM | POA: Diagnosis not present

## 2019-11-12 DIAGNOSIS — M4185 Other forms of scoliosis, thoracolumbar region: Secondary | ICD-10-CM | POA: Diagnosis not present

## 2019-11-12 DIAGNOSIS — R918 Other nonspecific abnormal finding of lung field: Secondary | ICD-10-CM | POA: Diagnosis not present

## 2019-11-12 DIAGNOSIS — M4186 Other forms of scoliosis, lumbar region: Secondary | ICD-10-CM | POA: Diagnosis not present

## 2019-11-12 DIAGNOSIS — J9811 Atelectasis: Secondary | ICD-10-CM | POA: Diagnosis not present

## 2019-11-20 NOTE — Progress Notes (Signed)
Established patient visit   Patient: Lucas Ramirez   DOB: 02/26/1956   64 y.o. Male  MRN: [id number] Visit Date: 11/22/2019  Today's healthcare provider: Lelon Huh, MD   Chief Complaint  Patient presents with  . Hypertension   I,Latasha Walston,acting as a scribe for Lelon Huh, MD.,have documented all relevant documentation on the behalf of Lelon Huh, MD,as directed by  Lelon Huh, MD while in the presence of Lelon Huh, MD.  Subjective    HPI  Hypertension, follow-up  BP Readings from Last 3 Encounters:  11/22/19 (!) 102/61  10/30/19 135/70  10/14/19 (!) 156/80   Wt Readings from Last 3 Encounters:  10/30/19 163 lb 2.3 oz (74 kg)  10/22/19 162 lb (73.5 kg)  10/14/19 161 lb (73 kg)     He was last seen for hypertension 6 weeks ago.  BP at that visit was 156/80. Management since that visit includes advised to double Lisinopril from 10 mg to 20 mg.  He reports good compliance with treatment. He is not having side effects.  He is following a Regular diet. He is not exercising. He does smoke.  Use of agents associated with hypertension: none.   Outside blood pressures are not being checked at home. Symptoms: No chest pain No chest pressure  No palpitations No syncope  No dyspnea No orthopnea  No paroxysmal nocturnal dyspnea No lower extremity edema   Pertinent labs: Lab Results  Component Value Date   CHOL 126 06/18/2019   HDL 29 (L) 06/18/2019   LDLCALC 73 06/18/2019   TRIG 133 06/18/2019   CHOLHDL 4.3 06/18/2019   Lab Results  Component Value Date   NA 132 (L) 10/24/2019   K 4.6 10/24/2019   CREATININE 1.80 (H) 10/24/2019   GFRNONAA 39 (L) 10/24/2019   GFRAA 45 (L) 10/24/2019   GLUCOSE 118 (H) 10/24/2019     The ASCVD Risk score Mikey Bussing DC Jr., et al., 2013) failed to calculate for the following reasons:   The valid total cholesterol range is 130 to 320 mg/dL    ---------------------------------------------------------------------------------------------------     Medications: Outpatient Medications Prior to Visit  Medication Sig  . acetaminophen (TYLENOL) 500 MG tablet Take 1 tablet (500 mg total) by mouth every 6 (six) hours as needed for mild pain.  . cholecalciferol (VITAMIN D) 1000 UNITS tablet Take 1,000 Units by mouth daily.  . clonazePAM (KLONOPIN) 1 MG tablet TAKE 1/2 TO 1 TABLET BY MOUTH EVERY 6 HOURS AS NEEDED (Patient taking differently: Take 0.5-1 mg by mouth every 6 (six) hours as needed (migraines). )  . DULoxetine (CYMBALTA) 20 MG capsule Take 20 mg by mouth in the morning and at bedtime. Morning & afternoon  . gabapentin (NEURONTIN) 800 MG tablet TAKE 1 TABLET BY MOUTH 3 TIMES DAILY AS NEEDED AS DIRECTED (Patient taking differently: Take 800 mg by mouth in the morning, at noon, and at bedtime. )  . hydrochlorothiazide (HYDRODIURIL) 25 MG tablet TAKE 1 TABLET BY MOUTH ONCE DAILY  . latanoprost (XALATAN) 0.005 % ophthalmic solution Place 1-2 drops into both eyes See admin instructions. Instill 1 drop into left eye at bedtime Instill 2 drops into right eye at bedtime  . lisinopril (ZESTRIL) 20 MG tablet Take 1 tablet (20 mg total) by mouth daily.  . methocarbamol (ROBAXIN) 750 MG tablet Take 750 mg by mouth 4 (four) times daily.  Marland Kitchen omeprazole (PRILOSEC) 20 MG capsule TAKE 1 CAPSULE BY MOUTH ONCE DAILY (Patient taking differently: Take 20  mg by mouth at bedtime. )  . oxyCODONE (ROXICODONE) 5 MG immediate release tablet Take 1-2 tablets (5-10 mg total) by mouth every 4 (four) hours as needed for moderate pain or severe pain (5mg  for moderate pain (3-5/10), severe pain (6-10/10)).  Marland Kitchen propranolol ER (INDERAL LA) 160 MG SR capsule TAKE 1 CAPSULE BY MOUTH ONCE DAILY (Patient taking differently: Take 160 mg by mouth at bedtime. )  . rosuvastatin (CRESTOR) 10 MG tablet TAKE 1 TABLET BY MOUTH ONCE DAILY  . SF 5000 PLUS 1.1 % CREA dental cream  Place 1 application onto teeth in the morning and at bedtime.  . SUMAtriptan (IMITREX) 100 MG tablet TAKE 1 TABLET BY MOUTH AT ONSET OF HEADACHE MAY TAKE AN ADDITIONAL TABLET IN 2 HOURS IF NEEDED MAX OF 200MG /24HR (Patient taking differently: Take 100 mg by mouth every 2 (two) hours as needed (migraines. (max 2 doses/24 hrs)). )  . timolol (TIMOPTIC) 0.5 % ophthalmic solution Place 1 drop into the right eye daily.   No facility-administered medications prior to visit.    Review of Systems  Constitutional: Negative.   Respiratory: Negative.   Cardiovascular: Negative.   Musculoskeletal: Negative.      Objective    BP (!) 102/61 (BP Location: Right Arm, Patient Position: Sitting, Cuff Size: Normal)   Pulse 77   Temp 98.3 F (36.8 C) (Oral)   Ht 5\' 6"  (1.676 m)   BMI 26.33 kg/m   Physical Exam   General: Appearance:     Overweight male in no acute distress  Eyes:    PERRL, conjunctiva/corneas clear, EOM's intact       Lungs:     Clear to auscultation bilaterally, respirations unlabored  Heart:    Normal heart rate. Normal rhythm. No murmurs, rubs, or gallops.   MS:   All extremities are intact.   Neurologic:   Awake, alert, oriented x 3. No apparent focal neurological           defect.          Assessment & Plan     1. Essential hypertension Much better with increased dose of lisinopril. Continue current medications.  Return in February for CPE     The entirety of the information documented in the History of Present Illness, Review of Systems and Physical Exam were personally obtained by me. Portions of this information were initially documented by the CMA and reviewed by me for thoroughness and accuracy.      Lelon Huh, MD  [facility] [phone] (phone) [phone] (fax)  [place]

## 2019-11-21 ENCOUNTER — Other Ambulatory Visit: Payer: Self-pay

## 2019-11-21 ENCOUNTER — Ambulatory Visit
Admission: RE | Admit: 2019-11-21 | Discharge: 2019-11-21 | Disposition: A | Discharge status: Home / Self Care | Payer: BC Managed Care – PPO | Source: Ambulatory Visit | Attending: Oncology | Admitting: Oncology

## 2019-11-21 DIAGNOSIS — R591 Generalized enlarged lymph nodes: Secondary | ICD-10-CM | POA: Diagnosis not present

## 2019-11-21 DIAGNOSIS — K5730 Diverticulosis of large intestine without perforation or abscess without bleeding: Secondary | ICD-10-CM | POA: Diagnosis not present

## 2019-11-21 DIAGNOSIS — I700 Atherosclerosis of aorta: Secondary | ICD-10-CM | POA: Diagnosis not present

## 2019-11-21 DIAGNOSIS — N281 Cyst of kidney, acquired: Secondary | ICD-10-CM | POA: Diagnosis not present

## 2019-11-21 DIAGNOSIS — M419 Scoliosis, unspecified: Secondary | ICD-10-CM | POA: Diagnosis not present

## 2019-11-21 MED ORDER — GADOBUTROL 1 MMOL/ML IV SOLN
7.0000 mL | Freq: Once | INTRAVENOUS | Status: AC | PRN
  Administered 2019-11-21: 7 mL via INTRAVENOUS

## 2019-11-22 ENCOUNTER — Encounter: Payer: Self-pay | Admitting: Family Medicine

## 2019-11-22 ENCOUNTER — Ambulatory Visit (INDEPENDENT_AMBULATORY_CARE_PROVIDER_SITE_OTHER): Payer: BC Managed Care – PPO | Admitting: Family Medicine

## 2019-11-22 VITALS — BP 102/61 | HR 77 | Temp 98.3°F | Ht 66.0 in

## 2019-11-22 DIAGNOSIS — I10 Essential (primary) hypertension: Secondary | ICD-10-CM

## 2019-11-22 NOTE — Patient Instructions (Signed)
.   Please review the attached list of medications and notify my office if there are any errors.   . Please bring all of your medications to every appointment so we can make sure that our medication list is the same as yours.   

## 2019-11-24 NOTE — Progress Notes (Signed)
[place]  [phone] [phone]  ID: Lucas Ramirez OB: [birth year]  MR#: [id number]  HER#:[id number]  Patient Care Team: Birdie Sons, MD as PCP - General (Family Medicine)  I connected with Lucas Ramirez on 11/27/19 at  3:15 PM EDT by video enabled telemedicine visit and verified that I am speaking with the correct person using two identifiers.   I discussed the limitations, risks, security and privacy concerns of performing an evaluation and management service by telemedicine and the availability of in-person appointments. I also discussed with the patient that there may be a patient responsible charge related to this service. The patient expressed understanding and agreed to proceed.   Other persons participating in the visit and their role in the encounter: Patient, MD.  Patient's location: Home. Provider's location: Clinic.  CHIEF COMPLAINT: Lymphadenopathy  INTERVAL HISTORY: Patient agreed to video enabled telemedicine visit for further evaluation and discussion of his imaging results.  He continues to have chronic neurologic complaints and back pain.  He otherwise feels well.  He denies any recent fevers or illnesses. He has a good appetite and denies weight loss.  He has no chest pain, shortness of breath, cough, or hemoptysis.  He denies any nausea, vomiting, constipation, or diarrhea.  He has no urinary complaints.  Patient offers no further specific complaints today.  REVIEW OF SYSTEMS:   Review of Systems  Constitutional: Negative.  Negative for diaphoresis, fever, malaise/fatigue and weight loss.  Respiratory: Negative.  Negative for cough, hemoptysis and shortness of breath.   Cardiovascular: Negative.  Negative for chest pain and leg swelling.  Gastrointestinal: Negative.  Negative for abdominal pain.  Genitourinary: Negative.  Negative for dysuria and hematuria.  Musculoskeletal: Positive for back pain.  Skin: Negative.   Negative for rash.  Neurological: Positive for sensory change. Negative for dizziness, focal weakness, weakness and headaches.  Psychiatric/Behavioral: Negative.  The patient is not nervous/anxious.     As per HPI. Otherwise, a complete review of systems is negative.  PAST MEDICAL HISTORY: Past Medical History:  Diagnosis Date  . Bell's palsy   . CAP (community acquired pneumonia) 06/28/2015  . COPD (chronic obstructive pulmonary disease) ([place])   . Depression   . GERD (gastroesophageal reflux disease)   . Glaucoma   . Hyperlipidemia   . Hypertension   . Migraine   . Scoliosis   . Shingles 2019   dx by dermatologist by patient report    PAST SURGICAL HISTORY: Past Surgical History:  Procedure Laterality Date  . [place]   correcting Scoliosils per pt  . COLONOSCOPY WITH PROPOFOL N/A 07/04/2019   Procedure: COLONOSCOPY WITH PROPOFOL;  Surgeon: Lin Landsman, MD;  Location: [facility];  Service: Gastroenterology;  Laterality: N/A;  PRIORITY 4  . LUMBAR LAMINECTOMY/ DECOMPRESSION WITH MET-RX Right 10/30/2019   Procedure: L4-5 DECOMPRESSION;  Surgeon: Meade Maw, MD;  Location: [facility] ORS;  Service: Neurosurgery;  Laterality: Right;  . SKIN LESION EXCISION  06/06/2013   facial left cheek. Dr. Evorn Gong  . UPPER GI ENDOSCOPY  07/11/2008   [facility]. Dr. Donnella Sham. Impression. -LA Grade C reflux esophagitis. 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 Emphysema Mother   . Down syndrome Sister   . CAD Neg Hx   . Heart disease Neg Hx   . Colon cancer Neg Hx   . Prostate cancer Neg Hx     ADVANCED  DIRECTIVES (Y/N):  N  HEALTH MAINTENANCE: Social History   Tobacco Use  . Smoking status: Current Every Day Smoker    Packs/day: 0.10    Years: 35.00    Pack years: 3.50    Types: Cigarettes  . Smokeless tobacco: Never Used  Vaping Use  . Vaping Use: Some days    Substance Use Topics  . Alcohol use: No  . Drug use: No     Colonoscopy:  PAP:  Bone density:  Lipid panel:  Allergies  Allergen Reactions  . Bupropion Hcl Other (See Comments)    seizures    Current Outpatient Medications  Medication Sig Dispense Refill  . acetaminophen (TYLENOL) 500 MG tablet Take 1 tablet (500 mg total) by mouth every 6 (six) hours as needed for mild pain. 30 tablet 0  . cholecalciferol (VITAMIN D) 1000 UNITS tablet Take 1,000 Units by mouth daily.    . clonazePAM (KLONOPIN) 1 MG tablet TAKE 1/2 TO 1 TABLET BY MOUTH EVERY 6 HOURS AS NEEDED (Patient taking differently: Take 0.5-1 mg by mouth every 6 (six) hours as needed (migraines). ) 30 tablet 5  . DULoxetine (CYMBALTA) 20 MG capsule Take 20 mg by mouth in the morning and at bedtime. Morning & afternoon    . gabapentin (NEURONTIN) 800 MG tablet TAKE 1 TABLET BY MOUTH 3 TIMES DAILY AS NEEDED AS DIRECTED (Patient taking differently: Take 800 mg by mouth in the morning, at noon, and at bedtime. ) 270 tablet 0  . hydrochlorothiazide (HYDRODIURIL) 25 MG tablet TAKE 1 TABLET BY MOUTH ONCE DAILY 90 tablet 1  . latanoprost (XALATAN) 0.005 % ophthalmic solution Place 1-2 drops into both eyes See admin instructions. Instill 1 drop into left eye at bedtime Instill 2 drops into right eye at bedtime    . lisinopril (ZESTRIL) 20 MG tablet Take 1 tablet (20 mg total) by mouth daily. 30 tablet 1  . methocarbamol (ROBAXIN) 750 MG tablet Take 750 mg by mouth 4 (four) times daily.    Marland Kitchen omeprazole (PRILOSEC) 20 MG capsule TAKE 1 CAPSULE BY MOUTH ONCE DAILY (Patient taking differently: Take 20 mg by mouth at bedtime. ) 30 capsule 11  . oxyCODONE (ROXICODONE) 5 MG immediate release tablet Take 1-2 tablets (5-10 mg total) by mouth every 4 (four) hours as needed for moderate pain or severe pain (57m for moderate pain (3-5/10), severe pain (6-10/10)). 30 tablet 0  . propranolol ER (INDERAL LA) 160 MG SR capsule TAKE 1 CAPSULE BY MOUTH  ONCE DAILY (Patient taking differently: Take 160 mg by mouth at bedtime. ) 30 each 12  . rosuvastatin (CRESTOR) 10 MG tablet TAKE 1 TABLET BY MOUTH ONCE DAILY 90 tablet 1  . SF 5000 PLUS 1.1 % CREA dental cream Place 1 application onto teeth in the morning and at bedtime.    . SUMAtriptan (IMITREX) 100 MG tablet TAKE 1 TABLET BY MOUTH AT ONSET OF HEADACHE MAY TAKE AN ADDITIONAL TABLET IN 2 HOURS IF NEEDED MAX OF 200MG/24HR (Patient taking differently: Take 100 mg by mouth every 2 (two) hours as needed (migraines. (max 2 doses/24 hrs)). ) 9 tablet 6  . timolol (TIMOPTIC) 0.5 % ophthalmic solution Place 1 drop into the right eye daily.     No current facility-administered medications for this visit.    OBJECTIVE: There were no vitals filed for this visit.   There is no height or weight on file to calculate BMI.    ECOG FS:0 - Asymptomatic  General: Well-developed, well-nourished,  no acute distress. HEENT: Normocephalic. Neuro: Alert, answering all questions appropriately. Cranial nerves grossly intact. Psych: Normal affect.  LAB RESULTS:  Lab Results  Component Value Date   NA 132 (L) 10/24/2019   K 4.6 10/24/2019   CL 94 (L) 10/24/2019   CO2 30 10/24/2019   GLUCOSE 118 (H) 10/24/2019   BUN 42 (H) 10/24/2019   CREATININE 1.80 (H) 10/24/2019   CALCIUM 9.3 10/24/2019   PROT 6.5 06/18/2019   ALBUMIN 4.3 06/18/2019   AST 14 06/18/2019   ALT 11 06/18/2019   ALKPHOS 72 06/18/2019   BILITOT 0.3 06/18/2019   GFRNONAA 39 (L) 10/24/2019   GFRAA 45 (L) 10/24/2019    Lab Results  Component Value Date   WBC 9.8 10/24/2019   NEUTROABS 4.9 04/01/2019   HGB 13.2 10/24/2019   HCT 39.4 10/24/2019   MCV 91.2 10/24/2019   PLT 284 10/24/2019     STUDIES: DG Lumbar Spine 2-3 Views  Result Date: 10/30/2019 CLINICAL DATA:  RIGHT L4-5 decompression. EXAM: OPERATIVE LUMBAR SPINE - 2-3 VIEW COMPARISON:  Lumbar spine CT 10/24/2019. FINDINGS: Eight spot images from the C-arm fluoroscopic device  are submitted for interpretation post-operatively. The completion AP and LATERAL images demonstrate the localizer device projected toward the L4-5 disc space. IMPRESSION: L4-5 localized intraoperatively. Electronically Signed   By: Evangeline Dakin M.D.   On: 10/30/2019 15:10   MR Abdomen W Wo Contrast  Result Date: 11/21/2019 CLINICAL DATA:  History of renal lesion discovered on previous CT imaging, assess renal lesion. EXAM: MRI ABDOMEN WITHOUT AND WITH CONTRAST TECHNIQUE: Multiplanar multisequence MR imaging of the abdomen was performed both before and after the administration of intravenous contrast. CONTRAST:  43m GADAVIST GADOBUTROL 1 MMOL/ML IV SOLN COMPARISON:  Multiple prior studies including spinal imaging from 2021 and CT of the abdomen and pelvis from 2020 FINDINGS: Lower chest: Small RIGHT-sided effusion. Limited assessment of the lung bases on MRI. Limited assessment of lung bases due to severe scoliotic curvature and rotary component. Hepatobiliary: No focal, suspicious hepatic lesion, ductal dilation or pericholecystic inflammation. Pancreas:  Mild pancreatic duct distension without focal lesion. Spleen:  Spleen normal in size and contour. Adrenals/Urinary Tract: Adrenal glands are normal. Signs of hemorrhagic cyst accounting for the abnormality discussed on the previous CT evaluation. This shows no enhancement, increased T1 signal at baseline and measures 1.8 x 1.7 cm arising from the medial RIGHT kidney. No hydronephrosis.  Symmetric renal enhancement. Stomach/Bowel: Colonic diverticulosis. No acute bowel process to the extent evaluated. Study not protocoled for bowel evaluation. Vascular/Lymphatic: Tortuosity of the abdominal aorta. No aneurysmal dilation. Atheromatous plaque throughout. Suspected lymph node in the LEFT retroperitoneum shows no enhancement, potentially a lymphangioma or similar benign process complicated by a small amount of blood or protein but showing no change over time. No  adenopathy in the retroperitoneum. Other: RIGHT paraspinal enhancement and T2 signal is similar accounting for differences in technique when compared to prior CT imaging, associated with pleural thickening in the RIGHT chest that is also stable over time edema in the musculature and chest wall show similar appearance to prior imaging studies, remaining nonspecific perhaps related to a mixture of fibrosis and muscular atrophy present to some extent in 2010. Musculoskeletal: As above with respect to chest wall findings. L4-5 laminectomy changes with severe levoconvex scoliotic curvature in the thoracolumbar spine in the lower thoracic spine with compensatory dextroconvexity in the thoracic spine. Signs of laminectomy at the L4-5 level with subtle enhancement along the tract but without signs  of significant edema in the soft tissues beyond the area of fluid signal tracking from the back to the level of the laminectomy and foraminotomy. IMPRESSION: 1. Hemorrhagic cyst arising from the medial RIGHT kidney accounting for the abnormality described on the previous CT. 2. cystic structure with hemorrhagic or proteinaceous contents in the LEFT retroperitoneum stable over time compatible with benign finding. 3. Signs of muscular atrophy and presumed granulation, associated with enhancement with similar appearance over time dating back to 2010. Perhaps this relates to prior trauma or infection. 4. Small RIGHT-sided effusion with chronic features. 5. Postoperative changes related to recent L4-5 laminectomy. 6. Colonic diverticulosis. 7. Severe scoliosis. Aortic Atherosclerosis (ICD10-I70.0). Electronically Signed   By: Zetta Bills M.D.   On: 11/21/2019 16:10   DG C-Arm 1-60 Min-No Report  Result Date: 10/30/2019 Fluoroscopy was utilized by the requesting physician.  No radiographic interpretation.    ASSESSMENT: Lymphadenopathy.  PLAN:   1. Lymphadenopathy: Chronic and unchanged.  CT scan results from April 16, 2019 reviewed independently and report as above.  Patient noted to have enlarged periaortic lymph node on imaging in 2010.  No further intervention is needed. 2.  Renal cyst: MRI from November 21, 2019 reviewed independently and report as above revealing stable hemorrhagic renal cysts that is likely benign.  No further interventions are needed.  Patient does not require additional imaging unless he becomes symptomatic.  No further follow-up has been scheduled.  Please refer patient back if there are any questions or concerns. 3.  Back pain/neurologic symptoms: Continue follow-up with [facility] neurosurgery as scheduled.    I provided 20 minutes of face-to-face video visit time during this encounter which included chart review, counseling, and coordination of care as documented above.   Patient expressed understanding and was in agreement with this plan. He also understands that He can call clinic at any time with any questions, concerns, or complaints.    Lloyd Huger, MD   11/27/2019 2:04 PM

## 2019-11-26 ENCOUNTER — Inpatient Hospital Stay: Payer: BC Managed Care – PPO | Attending: Oncology | Admitting: Oncology

## 2019-11-26 ENCOUNTER — Other Ambulatory Visit: Payer: Self-pay

## 2019-11-26 ENCOUNTER — Encounter: Payer: Self-pay | Admitting: Oncology

## 2019-11-26 DIAGNOSIS — R591 Generalized enlarged lymph nodes: Secondary | ICD-10-CM

## 2019-11-26 NOTE — Progress Notes (Signed)
Patient denies any concerns at this time 

## 2019-12-05 DIAGNOSIS — M810 Age-related osteoporosis without current pathological fracture: Secondary | ICD-10-CM | POA: Diagnosis not present

## 2019-12-05 DIAGNOSIS — E559 Vitamin D deficiency, unspecified: Secondary | ICD-10-CM | POA: Diagnosis not present

## 2019-12-09 DIAGNOSIS — M810 Age-related osteoporosis without current pathological fracture: Secondary | ICD-10-CM | POA: Diagnosis not present

## 2019-12-11 ENCOUNTER — Other Ambulatory Visit: Payer: Self-pay | Admitting: Family Medicine

## 2019-12-11 DIAGNOSIS — G43009 Migraine without aura, not intractable, without status migrainosus: Secondary | ICD-10-CM

## 2020-01-02 ENCOUNTER — Other Ambulatory Visit: Payer: Self-pay | Admitting: Family Medicine

## 2020-01-02 DIAGNOSIS — G629 Polyneuropathy, unspecified: Secondary | ICD-10-CM

## 2020-01-13 ENCOUNTER — Other Ambulatory Visit: Payer: Self-pay | Admitting: Family Medicine

## 2020-01-13 DIAGNOSIS — G43009 Migraine without aura, not intractable, without status migrainosus: Secondary | ICD-10-CM

## 2020-01-13 DIAGNOSIS — I10 Essential (primary) hypertension: Secondary | ICD-10-CM

## 2020-01-30 ENCOUNTER — Other Ambulatory Visit: Payer: Self-pay | Admitting: Family Medicine

## 2020-01-30 DIAGNOSIS — G43009 Migraine without aura, not intractable, without status migrainosus: Secondary | ICD-10-CM

## 2020-01-30 NOTE — Telephone Encounter (Signed)
Requested medication (s) are due for refill today - yes  Requested medication (s) are on the active medication list -yes  Future visit scheduled -yes  Last refill: 01/02/20  Notes to clinic: Request non delegated Rx  Requested Prescriptions  Pending Prescriptions Disp Refills   clonazePAM (KLONOPIN) 1 MG tablet [Pharmacy Med Name: CLONAZEPAM 1 MG TAB] 30 tablet     Sig: TAKE 1/2 TO 1 TABLET BY MOUTH EVERY 6 HOURS AS NEEDED      Not Delegated - Psychiatry:  Anxiolytics/Hypnotics Failed - 01/30/2020 11:14 AM      Failed - This refill cannot be delegated      Failed - Urine Drug Screen completed in last 360 days.      Passed - Valid encounter within last 6 months    Recent Outpatient Visits           2 months ago Essential hypertension   [facility] Birdie Sons, MD   3 months ago Essential hypertension   [facility] Birdie Sons, MD   3 months ago Migraine without aura and without status migrainosus, not intractable   [place], [place], [place]   7 months ago Essential hypertension   [facility] Birdie Sons, MD   9 months ago Essential hypertension   [place], Kirstie Peri, MD       Future Appointments             In 4 months Fisher, Kirstie Peri, MD [facility]                Requested Prescriptions  Pending Prescriptions Disp Refills   clonazePAM (KLONOPIN) 1 MG tablet [Pharmacy Med Name: CLONAZEPAM 1 MG TAB] 30 tablet     Sig: TAKE 1/2 TO 1 TABLET BY MOUTH EVERY 6 HOURS AS NEEDED      Not Delegated - Psychiatry:  Anxiolytics/Hypnotics Failed - 01/30/2020 11:14 AM      Failed - This refill cannot be delegated      Failed - Urine Drug Screen completed in last 360 days.      Passed - Valid encounter within last 6 months    Recent Outpatient Visits           2 months ago Essential hypertension   [facility] Birdie Sons,  MD   3 months ago Essential hypertension   [facility] Birdie Sons, MD   3 months ago Migraine without aura and without status migrainosus, not intractable   [place], [place], [place]   7 months ago Essential hypertension   [facility] Birdie Sons, MD   9 months ago Essential hypertension   [facility] Birdie Sons, MD       Future Appointments             In 4 months Fisher, Kirstie Peri, MD [facility], [place]

## 2020-03-02 DIAGNOSIS — F1120 Opioid dependence, uncomplicated: Secondary | ICD-10-CM | POA: Diagnosis not present

## 2020-03-02 DIAGNOSIS — Z79891 Long term (current) use of opiate analgesic: Secondary | ICD-10-CM | POA: Diagnosis not present

## 2020-03-13 DIAGNOSIS — M79671 Pain in right foot: Secondary | ICD-10-CM | POA: Diagnosis not present

## 2020-03-20 ENCOUNTER — Other Ambulatory Visit: Payer: Self-pay | Admitting: Family Medicine

## 2020-03-20 DIAGNOSIS — I10 Essential (primary) hypertension: Secondary | ICD-10-CM

## 2020-03-25 DIAGNOSIS — Z79891 Long term (current) use of opiate analgesic: Secondary | ICD-10-CM | POA: Diagnosis not present

## 2020-03-25 DIAGNOSIS — F1120 Opioid dependence, uncomplicated: Secondary | ICD-10-CM | POA: Diagnosis not present

## 2020-03-26 ENCOUNTER — Other Ambulatory Visit: Payer: Self-pay | Admitting: Family Medicine

## 2020-03-26 DIAGNOSIS — G629 Polyneuropathy, unspecified: Secondary | ICD-10-CM

## 2020-04-02 DIAGNOSIS — M419 Scoliosis, unspecified: Secondary | ICD-10-CM | POA: Diagnosis not present

## 2020-04-02 DIAGNOSIS — M810 Age-related osteoporosis without current pathological fracture: Secondary | ICD-10-CM | POA: Diagnosis not present

## 2020-04-09 DIAGNOSIS — M4185 Other forms of scoliosis, thoracolumbar region: Secondary | ICD-10-CM | POA: Diagnosis not present

## 2020-04-15 DIAGNOSIS — Z79891 Long term (current) use of opiate analgesic: Secondary | ICD-10-CM | POA: Diagnosis not present

## 2020-04-15 DIAGNOSIS — F1120 Opioid dependence, uncomplicated: Secondary | ICD-10-CM | POA: Diagnosis not present

## 2020-04-20 ENCOUNTER — Other Ambulatory Visit: Payer: Self-pay | Admitting: Family Medicine

## 2020-04-20 DIAGNOSIS — I10 Essential (primary) hypertension: Secondary | ICD-10-CM

## 2020-04-28 ENCOUNTER — Other Ambulatory Visit: Payer: Self-pay | Admitting: Family Medicine

## 2020-04-28 DIAGNOSIS — I10 Essential (primary) hypertension: Secondary | ICD-10-CM

## 2020-06-22 ENCOUNTER — Encounter: Payer: BC Managed Care – PPO | Admitting: Family Medicine

## 2020-06-22 NOTE — Progress Notes (Deleted)
Complete physical exam   Patient: Lucas Ramirez   DOB: February 13, 1956   65 y.o. Male  MRN: [id number] Visit Date: 06/22/2020  Today's healthcare provider: Lelon Huh, MD   No chief complaint on file.  Subjective    Lucas Ramirez is a 65 y.o. male who presents today for a complete physical exam.  He reports consuming a {diet types:17450} diet. {Exercise:19826} He generally feels {well/fairly well/poorly:18703}. He reports sleeping {well/fairly well/poorly:18703}. He {does/does not:200015} have additional problems to discuss today.  HPI  ***  Past Medical History:  Diagnosis Date  . Bell's palsy   . CAP (community acquired pneumonia) 06/28/2015  . COPD (chronic obstructive pulmonary disease) ([place])   . Depression   . GERD (gastroesophageal reflux disease)   . Glaucoma   . Hyperlipidemia   . Hypertension   . Migraine   . Scoliosis   . Shingles 2019   dx by dermatologist by patient report   Past Surgical History:  Procedure Laterality Date  . [place]   correcting Scoliosils per pt  . COLONOSCOPY WITH PROPOFOL N/A 07/04/2019   Procedure: COLONOSCOPY WITH PROPOFOL;  Surgeon: Lin Landsman, MD;  Location[facility] END[facility]COPY;  Service: Gastroenterology;  Laterality: N/A;  PRIORITY 4  . LUMBAR LAMINECTOMY/ DECOMPRESSION WITH MET-RX Right 10/30/2019   Procedure: L4-5 DECOMPRESSION;  Surgeon: Meade Maw, MD;  Location: [facility] ORS;  Service: Neurosurgery;  Laterality: Right;  . SKIN LESION EXCISION  06/06/2013   facial left cheek. Dr. Evorn Gong  . UPPER GI ENDOSCOPY  07/11/2008   [facility]. Dr. Donnella Sham. Impression. -LA Grade C reflux esophagitis. 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   Social History   Socioeconomic History  . Marital status: Married    Spouse name: Not on file  . Number of children: Not on file  . Years of education: Not on file  . Highest education level: Not on file  Occupational History  . Not on file  Tobacco Use   . Smoking status: Current Every Day Smoker    Packs/day: 0.10    Years: 35.00    Pack years: 3.50    Types: Cigarettes  . Smokeless tobacco: Never Used  Vaping Use  . Vaping Use: Some days  Substance and Sexual Activity  . Alcohol use: No  . Drug use: No  . Sexual activity: Yes  Other Topics Concern  . Not on file  Social History Narrative  . Not on file   Social Determinants of Health   Financial Resource Strain: Not on file  Food Insecurity: Not on file  Transportation Needs: Not on file  Physical Activity: Not on file  Stress: Not on file  Social Connections: Not on file  Intimate Partner Violence: Not on file   Family Status  Relation Name Status  . Mother  Deceased  . Father  Alive  . Sister  Alive  . Neg Hx  (Not Specified)   Family History  Problem Relation Age of Onset  . Emphysema Mother   . Down syndrome Sister   . CAD Neg Hx   . Heart disease Neg Hx   . Colon cancer Neg Hx   . Prostate cancer Neg Hx    Allergies  Allergen Reactions  . Bupropion Hcl Other (See Comments)    seizures    Patient Care Team: Birdie Sons, MD as PCP - General (Family Medicine)   Medications: Outpatient Medications Prior to Visit  Medication Sig  . acetaminophen (TYLENOL)  500 MG tablet Take 1 tablet (500 mg total) by mouth every 6 (six) hours as needed for mild pain.  . cholecalciferol (VITAMIN D) 1000 UNITS tablet Take 1,000 Units by mouth daily.  . clonazePAM (KLONOPIN) 1 MG tablet Take 0.5-1 tablets (0.5-1 mg total) by mouth every 6 (six) hours as needed (migraines).  . DULoxetine (CYMBALTA) 20 MG capsule Take 20 mg by mouth in the morning and at bedtime. Morning & afternoon  . gabapentin (NEURONTIN) 800 MG tablet TAKE 1 TABLET BY MOUTH 3 TIMES DAILY AS NEEDED AS DIRECTED  . hydrochlorothiazide (HYDRODIURIL) 25 MG tablet TAKE 1 TABLET BY MOUTH ONCE DAILY  . latanoprost (XALATAN) 0.005 % ophthalmic solution Place 1-2 drops into both eyes See admin instructions.  Instill 1 drop into left eye at bedtime Instill 2 drops into right eye at bedtime  . lisinopril (ZESTRIL) 20 MG tablet TAKE 1 TABLET BY MOUTH ONCE DAILY  . methocarbamol (ROBAXIN) 750 MG tablet Take 750 mg by mouth 4 (four) times daily.  Marland Kitchen omeprazole (PRILOSEC) 20 MG capsule TAKE 1 CAPSULE BY MOUTH ONCE DAILY (Patient taking differently: Take 20 mg by mouth at bedtime. )  . oxyCODONE (ROXICODONE) 5 MG immediate release tablet Take 1-2 tablets (5-10 mg total) by mouth every 4 (four) hours as needed for moderate pain or severe pain (46m for moderate pain (3-5/10), severe pain (6-10/10)).  .Marland Kitchenpropranolol ER (INDERAL LA) 160 MG SR capsule TAKE 1 CAPSULE BY MOUTH ONCE DAILY  . rosuvastatin (CRESTOR) 10 MG tablet TAKE 1 TABLET BY MOUTH ONCE DAILY  . SF 5000 PLUS 1.1 % CREA dental cream Place 1 application onto teeth in the morning and at bedtime.  . SUMAtriptan (IMITREX) 100 MG tablet TAKE 1 TABLET BY MOUTH AT ONSET OF HEADACHE MAY TAKE AN ADDITIONAL TABLET IN 2 HOURS IF NEEDED MAX OF 200MG/24HR (Patient taking differently: Take 100 mg by mouth every 2 (two) hours as needed (migraines. (max 2 doses/24 hrs)). )  . timolol (TIMOPTIC) 0.5 % ophthalmic solution Place 1 drop into the right eye daily.   No facility-administered medications prior to visit.    Review of Systems  {Labs  Heme  Chem  Endocrine  Serology  Results Review (optional):23779::" "}  Objective    There were no vitals taken for this visit. {Show previous vital signs (optional):23777::" "}  Physical Exam  ***  Last depression screening scores PHQ 2/9 Scores 02/22/2019  PHQ - 2 Score 0  PHQ- 9 Score 2   Last fall risk screening Fall Risk  02/22/2019  Falls in the past year? 1  Number falls in past yr: 0  Injury with Fall? 1   Last Audit-C alcohol use screening Alcohol Use Disorder Test (AUDIT) 02/22/2019  1. How often do you have a drink containing alcohol? 0  2. How many drinks containing alcohol do you have on a  typical day when you are drinking? 0  3. How often do you have six or more drinks on one occasion? 0  AUDIT-C Score 0   A score of 3 or more in women, and 4 or more in men indicates increased risk for alcohol abuse, EXCEPT if all of the points are from question 1   No results found for any visits on 06/22/20.  Assessment & Plan    Routine Health Maintenance and Physical Exam  Exercise Activities and Dietary recommendations Goals   None     Immunization History  Administered Date(s) Administered  . Influenza,inj,Quad PF,6+ Mos 06/30/2015,  03/22/2017, 01/25/2019  . PFIZER(Purple Top)SARS-COV-2 Vaccination 07/15/2019, 08/05/2019  . Pneumococcal Polysaccharide-23 04/10/2013, 06/30/2015  . Td 12/24/2003  . Tdap 04/10/2013    Health Maintenance  Topic Date Due  . INFLUENZA VACCINE  11/24/2019  . COVID-19 Vaccine (3 - Booster for Pfizer series) 02/04/2020  . COLONOSCOPY (Pts 45-38yr Insurance coverage will need to be confirmed)  07/04/2022  . TETANUS/TDAP  04/11/2023  . Hepatitis C Screening  Completed  . HIV Screening  Completed    Discussed health benefits of physical activity, and encouraged him to engage in regular exercise appropriate for his age and condition.  ***  No follow-ups on file.     {provider attestation***:1}   DLelon Huh MD  [phone]([phone](fax)  [place]

## 2020-06-23 ENCOUNTER — Other Ambulatory Visit: Payer: Self-pay | Admitting: Family Medicine

## 2020-06-23 DIAGNOSIS — G629 Polyneuropathy, unspecified: Secondary | ICD-10-CM

## 2020-07-20 ENCOUNTER — Encounter: Payer: Self-pay | Admitting: Family Medicine

## 2020-07-20 NOTE — Progress Notes (Deleted)
Complete physical exam   Patient: Lucas Ramirez   DOB: Sep 07, 1955   65 y.o. Male  MRN: [id number] Visit Date: 07/20/2020  Today's healthcare provider: Lelon Huh, MD   No chief complaint on file.  Subjective    Lucas Ramirez is a 65 y.o. male who presents today for a complete physical exam.  He reports consuming a {diet types:17450} diet. {Exercise:19826} He generally feels {well/fairly well/poorly:18703}. He reports sleeping {well/fairly well/poorly:18703}. He {does/does not:200015} have additional problems to discuss today.  HPI    Past Medical History:  Diagnosis Date  . Bell's palsy   . CAP (community acquired pneumonia) 06/28/2015  . COPD (chronic obstructive pulmonary disease) ([place])   . Depression   . GERD (gastroesophageal reflux disease)   . Glaucoma   . Hyperlipidemia   . Hypertension   . Migraine   . Scoliosis   . Shingles 2019   dx by dermatologist by patient report   Past Surgical History:  Procedure Laterality Date  . [place]   correcting Scoliosils per pt  . COLONOSCOPY WITH PROPOFOL N/A 07/04/2019   Procedure: COLONOSCOPY WITH PROPOFOL;  Surgeon: Lin Landsman, MD;  Location: [facility] ENDOSCOPY;  Service: Gastroenterology;  Laterality: N/A;  PRIORITY 4  . LUMBAR LAMINECTOMY/ DECOMPRESSION WITH MET-RX Right 10/30/2019   Procedure: L4-5 DECOMPRESSION;  Surgeon: Meade Maw, MD;  Location: [facility] ORS;  Service: Neurosurgery;  Laterality: Right;  . SKIN LESION EXCISION  06/06/2013   facial left cheek. Dr. Evorn Gong  . UPPER GI ENDOSCOPY  07/11/2008   [facility]. Dr. Donnella Sham. Impression. -LA Grade C reflux esophagitis. 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   Social History   Socioeconomic History  . Marital status: Married    Spouse name: Not on file  . Number of children: Not on file  . Years of education: Not on file  . Highest education level: Not on file  Occupational History  . Not on file  Tobacco Use  .  Smoking status: Current Every Day Smoker    Packs/day: 0.10    Years: 35.00    Pack years: 3.50    Types: Cigarettes  . Smokeless tobacco: Never Used  Vaping Use  . Vaping Use: Some days  Substance and Sexual Activity  . Alcohol use: No  . Drug use: No  . Sexual activity: Yes  Other Topics Concern  . Not on file  Social History Narrative  . Not on file   Social Determinants of Health   Financial Resource Strain: Not on file  Food Insecurity: Not on file  Transportation Needs: Not on file  Physical Activity: Not on file  Stress: Not on file  Social Connections: Not on file  Intimate Partner Violence: Not on file   Family Status  Relation Name Status  . Mother  Deceased  . Father  Alive  . Sister  Alive  . Neg Hx  (Not Specified)   Family History  Problem Relation Age of Onset  . Emphysema Mother   . Down syndrome Sister   . CAD Neg Hx   . Heart disease Neg Hx   . Colon cancer Neg Hx   . Prostate cancer Neg Hx    Allergies  Allergen Reactions  . Bupropion Hcl Other (See Comments)    seizures    Patient Care Team: Birdie Sons, MD as PCP - General (Family Medicine)   Medications: Outpatient Medications Prior to Visit  Medication Sig  . acetaminophen (TYLENOL)  500 MG tablet Take 1 tablet (500 mg total) by mouth every 6 (six) hours as needed for mild pain.  . cholecalciferol (VITAMIN D) 1000 UNITS tablet Take 1,000 Units by mouth daily.  . clonazePAM (KLONOPIN) 1 MG tablet Take 0.5-1 tablets (0.5-1 mg total) by mouth every 6 (six) hours as needed (migraines).  . DULoxetine (CYMBALTA) 20 MG capsule Take 20 mg by mouth in the morning and at bedtime. Morning & afternoon  . gabapentin (NEURONTIN) 800 MG tablet TAKE 1 TABLET BY MOUTH 3 TIMES DAILY AS NEEDED AS DIRECTED  . hydrochlorothiazide (HYDRODIURIL) 25 MG tablet TAKE 1 TABLET BY MOUTH ONCE DAILY  . latanoprost (XALATAN) 0.005 % ophthalmic solution Place 1-2 drops into both eyes See admin instructions.  Instill 1 drop into left eye at bedtime Instill 2 drops into right eye at bedtime  . lisinopril (ZESTRIL) 20 MG tablet TAKE 1 TABLET BY MOUTH ONCE DAILY  . methocarbamol (ROBAXIN) 750 MG tablet Take 750 mg by mouth 4 (four) times daily.  Marland Kitchen omeprazole (PRILOSEC) 20 MG capsule TAKE 1 CAPSULE BY MOUTH ONCE DAILY (Patient taking differently: Take 20 mg by mouth at bedtime. )  . oxyCODONE (ROXICODONE) 5 MG immediate release tablet Take 1-2 tablets (5-10 mg total) by mouth every 4 (four) hours as needed for moderate pain or severe pain (74m for moderate pain (3-5/10), severe pain (6-10/10)).  .Marland Kitchenpropranolol ER (INDERAL LA) 160 MG SR capsule TAKE 1 CAPSULE BY MOUTH ONCE DAILY  . rosuvastatin (CRESTOR) 10 MG tablet TAKE 1 TABLET BY MOUTH ONCE DAILY  . SF 5000 PLUS 1.1 % CREA dental cream Place 1 application onto teeth in the morning and at bedtime.  . SUMAtriptan (IMITREX) 100 MG tablet TAKE 1 TABLET BY MOUTH AT ONSET OF HEADACHE MAY TAKE AN ADDITIONAL TABLET IN 2 HOURS IF NEEDED MAX OF 200MG/24HR (Patient taking differently: Take 100 mg by mouth every 2 (two) hours as needed (migraines. (max 2 doses/24 hrs)). )  . timolol (TIMOPTIC) 0.5 % ophthalmic solution Place 1 drop into the right eye daily.   No facility-administered medications prior to visit.    Review of Systems  Last CBC Lab Results  Component Value Date   WBC 9.8 10/24/2019   HGB 13.2 10/24/2019   HCT 39.4 10/24/2019   MCV 91.2 10/24/2019   MCH 30.6 10/24/2019   RDW 13.0 10/24/2019   PLT 284 050/38/8828  Last metabolic panel Lab Results  Component Value Date   GLUCOSE 118 (H) 10/24/2019   NA 132 (L) 10/24/2019   K 4.6 10/24/2019   CL 94 (L) 10/24/2019   CO2 30 10/24/2019   BUN 42 (H) 10/24/2019   CREATININE 1.80 (H) 10/24/2019   GFRNONAA 39 (L) 10/24/2019   GFRAA 45 (L) 10/24/2019   CALCIUM 9.3 10/24/2019   PROT 6.5 06/18/2019   ALBUMIN 4.3 06/18/2019   LABGLOB 2.2 06/18/2019   AGRATIO 2.0 06/18/2019   BILITOT 0.3  06/18/2019   ALKPHOS 72 06/18/2019   AST 14 06/18/2019   ALT 11 06/18/2019   ANIONGAP 8 10/24/2019   Last lipids Lab Results  Component Value Date   CHOL 126 06/18/2019   HDL 29 (L) 06/18/2019   LDLCALC 73 06/18/2019   TRIG 133 06/18/2019   CHOLHDL 4.3 06/18/2019   Last hemoglobin A1c Lab Results  Component Value Date   HGBA1C 5.3 11/14/2018   Last thyroid functions Lab Results  Component Value Date   TSH 1.030 12/06/2018   T4TOTAL 8.5 12/06/2018  Last vitamin D Lab Results  Component Value Date   VD25OH 28.1 (L) 02/22/2019   Last vitamin B12 and Folate Lab Results  Component Value Date   VITAMINB12 372 11/14/2018      Objective    There were no vitals taken for this visit. BP Readings from Last 3 Encounters:  11/22/19 (!) 102/61  10/30/19 135/70  10/14/19 (!) 156/80   Wt Readings from Last 3 Encounters:  10/30/19 163 lb 2.3 oz (74 kg)  10/22/19 162 lb (73.5 kg)  10/14/19 161 lb (73 kg)      Physical Exam  ***  Last depression screening scores PHQ 2/9 Scores 02/22/2019  PHQ - 2 Score 0  PHQ- 9 Score 2   Last fall risk screening Fall Risk  02/22/2019  Falls in the past year? 1  Number falls in past yr: 0  Injury with Fall? 1   Last Audit-C alcohol use screening Alcohol Use Disorder Test (AUDIT) 02/22/2019  1. How often do you have a drink containing alcohol? 0  2. How many drinks containing alcohol do you have on a typical day when you are drinking? 0  3. How often do you have six or more drinks on one occasion? 0  AUDIT-C Score 0   A score of 3 or more in women, and 4 or more in men indicates increased risk for alcohol abuse, EXCEPT if all of the points are from question 1   No results found for any visits on 07/20/20.  Assessment & Plan    Routine Health Maintenance and Physical Exam  Exercise Activities and Dietary recommendations Goals   None     Immunization History  Administered Date(s) Administered  . Influenza,inj,Quad  PF,6+ Mos 06/30/2015, 03/22/2017, 01/25/2019  . PFIZER(Purple Top)SARS-COV-2 Vaccination 07/15/2019, 08/05/2019  . Pneumococcal Polysaccharide-23 04/10/2013, 06/30/2015  . Td 12/24/2003  . Tdap 04/10/2013    Health Maintenance  Topic Date Due  . INFLUENZA VACCINE  11/24/2019  . COVID-19 Vaccine (3 - Booster for Pfizer series) 02/04/2020  . COLONOSCOPY (Pts 45-27yr Insurance coverage will need to be confirmed)  07/04/2022  . TETANUS/TDAP  04/11/2023  . Hepatitis C Screening  Completed  . HIV Screening  Completed  . HPV VACCINES  Aged Out    Discussed health benefits of physical activity, and encouraged him to engage in regular exercise appropriate for his age and condition.  ***  No follow-ups on file.     {provider attestation***:1}   DLelon Huh MD  [phone](phone) [phone](fax)  [place]

## 2020-07-20 NOTE — Patient Instructions (Incomplete)

## 2020-08-19 ENCOUNTER — Inpatient Hospital Stay
Admission: EM | Admit: 2020-08-19 | Discharge: 2020-08-21 | DRG: 065 | Disposition: A | Discharge status: Home Health | Payer: BC Managed Care – PPO | Source: Ambulatory Visit | Attending: Hospitalist | Admitting: Hospitalist

## 2020-08-19 ENCOUNTER — Observation Stay
Admit: 2020-08-19 | Discharge: 2020-08-19 | Disposition: A | Discharge status: Home / Self Care | Payer: BC Managed Care – PPO | Attending: Internal Medicine | Admitting: Internal Medicine

## 2020-08-19 ENCOUNTER — Encounter: Payer: Self-pay | Admitting: Emergency Medicine

## 2020-08-19 ENCOUNTER — Emergency Department: Payer: BC Managed Care – PPO

## 2020-08-19 ENCOUNTER — Observation Stay: Payer: BC Managed Care – PPO

## 2020-08-19 ENCOUNTER — Other Ambulatory Visit: Payer: Self-pay

## 2020-08-19 DIAGNOSIS — H5461 Unqualified visual loss, right eye, normal vision left eye: Secondary | ICD-10-CM | POA: Diagnosis present

## 2020-08-19 DIAGNOSIS — R29703 NIHSS score 3: Secondary | ICD-10-CM | POA: Diagnosis present

## 2020-08-19 DIAGNOSIS — Z79899 Other long term (current) drug therapy: Secondary | ICD-10-CM

## 2020-08-19 DIAGNOSIS — J449 Chronic obstructive pulmonary disease, unspecified: Secondary | ICD-10-CM | POA: Diagnosis present

## 2020-08-19 DIAGNOSIS — R269 Unspecified abnormalities of gait and mobility: Secondary | ICD-10-CM | POA: Diagnosis present

## 2020-08-19 DIAGNOSIS — H547 Unspecified visual loss: Secondary | ICD-10-CM

## 2020-08-19 DIAGNOSIS — Z20822 Contact with and (suspected) exposure to covid-19: Secondary | ICD-10-CM | POA: Diagnosis present

## 2020-08-19 DIAGNOSIS — I63131 Cerebral infarction due to embolism of right carotid artery: Secondary | ICD-10-CM | POA: Diagnosis not present

## 2020-08-19 DIAGNOSIS — M810 Age-related osteoporosis without current pathological fracture: Secondary | ICD-10-CM | POA: Diagnosis present

## 2020-08-19 DIAGNOSIS — Z888 Allergy status to other drugs, medicaments and biological substances status: Secondary | ICD-10-CM

## 2020-08-19 DIAGNOSIS — I639 Cerebral infarction, unspecified: Secondary | ICD-10-CM | POA: Diagnosis present

## 2020-08-19 DIAGNOSIS — I129 Hypertensive chronic kidney disease with stage 1 through stage 4 chronic kidney disease, or unspecified chronic kidney disease: Secondary | ICD-10-CM | POA: Diagnosis present

## 2020-08-19 DIAGNOSIS — M5416 Radiculopathy, lumbar region: Secondary | ICD-10-CM | POA: Diagnosis present

## 2020-08-19 DIAGNOSIS — H3411 Central retinal artery occlusion, right eye: Secondary | ICD-10-CM | POA: Diagnosis present

## 2020-08-19 DIAGNOSIS — H409 Unspecified glaucoma: Secondary | ICD-10-CM | POA: Diagnosis present

## 2020-08-19 DIAGNOSIS — F17210 Nicotine dependence, cigarettes, uncomplicated: Secondary | ICD-10-CM | POA: Diagnosis present

## 2020-08-19 DIAGNOSIS — E871 Hypo-osmolality and hyponatremia: Secondary | ICD-10-CM | POA: Diagnosis present

## 2020-08-19 DIAGNOSIS — K219 Gastro-esophageal reflux disease without esophagitis: Secondary | ICD-10-CM | POA: Diagnosis present

## 2020-08-19 DIAGNOSIS — F419 Anxiety disorder, unspecified: Secondary | ICD-10-CM | POA: Diagnosis present

## 2020-08-19 DIAGNOSIS — E785 Hyperlipidemia, unspecified: Secondary | ICD-10-CM | POA: Diagnosis present

## 2020-08-19 DIAGNOSIS — Z825 Family history of asthma and other chronic lower respiratory diseases: Secondary | ICD-10-CM

## 2020-08-19 DIAGNOSIS — Z66 Do not resuscitate: Secondary | ICD-10-CM | POA: Diagnosis present

## 2020-08-19 DIAGNOSIS — G43909 Migraine, unspecified, not intractable, without status migrainosus: Secondary | ICD-10-CM | POA: Diagnosis present

## 2020-08-19 DIAGNOSIS — E861 Hypovolemia: Secondary | ICD-10-CM | POA: Diagnosis present

## 2020-08-19 DIAGNOSIS — N1831 Chronic kidney disease, stage 3a: Secondary | ICD-10-CM | POA: Diagnosis present

## 2020-08-19 DIAGNOSIS — F32A Depression, unspecified: Secondary | ICD-10-CM | POA: Diagnosis present

## 2020-08-19 LAB — COMPREHENSIVE METABOLIC PANEL
ALT: 11 U/L (ref 0–44)
AST: 15 U/L (ref 15–41)
Albumin: 4.3 g/dL (ref 3.5–5.0)
Alkaline Phosphatase: 75 U/L (ref 38–126)
Anion gap: 11 (ref 5–15)
BUN: 27 mg/dL — ABNORMAL HIGH (ref 8–23)
CO2: 27 mmol/L (ref 22–32)
Calcium: 8.6 mg/dL — ABNORMAL LOW (ref 8.9–10.3)
Chloride: 92 mmol/L — ABNORMAL LOW (ref 98–111)
Creatinine, Ser: 1.52 mg/dL — ABNORMAL HIGH (ref 0.61–1.24)
GFR, Estimated: 51 mL/min — ABNORMAL LOW (ref >60)
Glucose, Bld: 108 mg/dL — ABNORMAL HIGH (ref 70–99)
Potassium: 4.9 mmol/L (ref 3.5–5.1)
Sodium: 130 mmol/L — ABNORMAL LOW (ref 135–145)
Total Bilirubin: 0.5 mg/dL (ref 0.3–1.2)
Total Protein: 7.6 g/dL (ref 6.5–8.1)

## 2020-08-19 LAB — DIFFERENTIAL
Abs Immature Granulocytes: 0.02 10*3/uL (ref 0.00–0.07)
Basophils Absolute: 0 10*3/uL (ref 0.0–0.1)
Basophils Relative: 0 %
Eosinophils Absolute: 0.1 10*3/uL (ref 0.0–0.5)
Eosinophils Relative: 1 %
Immature Granulocytes: 0 %
Lymphocytes Relative: 12 %
Lymphs Abs: 1.2 10*3/uL (ref 0.7–4.0)
Monocytes Absolute: 0.9 10*3/uL (ref 0.1–1.0)
Monocytes Relative: 9 %
Neutro Abs: 7.4 10*3/uL (ref 1.7–7.7)
Neutrophils Relative %: 78 %

## 2020-08-19 LAB — RESP PANEL BY RT-PCR (FLU A&B, COVID) ARPGX2
Influenza A by PCR: NEGATIVE
Influenza B by PCR: NEGATIVE
SARS Coronavirus 2 by RT PCR: NEGATIVE

## 2020-08-19 LAB — CBC
HCT: 39.2 % (ref 39.0–52.0)
Hemoglobin: 13.3 g/dL (ref 13.0–17.0)
MCH: 29.2 pg (ref 26.0–34.0)
MCHC: 33.9 g/dL (ref 30.0–36.0)
MCV: 86.2 fL (ref 80.0–100.0)
Platelets: 403 10*3/uL — ABNORMAL HIGH (ref 150–400)
RBC: 4.55 MIL/uL (ref 4.22–5.81)
RDW: 13.8 % (ref 11.5–15.5)
WBC: 9.7 10*3/uL (ref 4.0–10.5)
nRBC: 0 % (ref 0.0–0.2)

## 2020-08-19 LAB — PROTIME-INR
INR: 1 (ref 0.8–1.2)
Prothrombin Time: 12.9 seconds (ref 11.4–15.2)

## 2020-08-19 LAB — ETHANOL: Alcohol, Ethyl (B): <10 mg/dL (ref <10)

## 2020-08-19 LAB — APTT: aPTT: 30 seconds (ref 24–36)

## 2020-08-19 MED ORDER — HYDRALAZINE HCL 20 MG/ML IJ SOLN: 5.0000 mg | Freq: Four times a day (QID) | INTRAMUSCULAR | Status: DC | PRN

## 2020-08-19 MED ORDER — ENOXAPARIN SODIUM 40 MG/0.4ML ~~LOC~~ SOLN
40.0000 mg | SUBCUTANEOUS | Status: DC
  Administered 2020-08-19 – 2020-08-20 (×2): 40 mg via SUBCUTANEOUS
  Filled 2020-08-19 (×2): qty 0.4

## 2020-08-19 MED ORDER — PANTOPRAZOLE SODIUM 40 MG PO TBEC
40.0000 mg | DELAYED_RELEASE_TABLET | Freq: Every day | ORAL | Status: DC
  Administered 2020-08-19 – 2020-08-21 (×3): 40 mg via ORAL
  Filled 2020-08-19 (×3): qty 1

## 2020-08-19 MED ORDER — ONDANSETRON HCL 4 MG/2ML IJ SOLN: 4.0000 mg | Freq: Four times a day (QID) | INTRAMUSCULAR | Status: DC | PRN

## 2020-08-19 MED ORDER — SENNA 8.6 MG PO TABS: 1.0000 | ORAL_TABLET | Freq: Every day | ORAL | Status: DC | PRN

## 2020-08-19 MED ORDER — ASPIRIN 300 MG RE SUPP: 300.0000 mg | Freq: Every day | RECTAL | Status: DC

## 2020-08-19 MED ORDER — ACETAMINOPHEN 325 MG PO TABS
650.0000 mg | ORAL_TABLET | Freq: Four times a day (QID) | ORAL | Status: DC | PRN
  Administered 2020-08-19: 22:00:00 650 mg via ORAL
  Filled 2020-08-19: qty 2

## 2020-08-19 MED ORDER — GABAPENTIN 100 MG PO CAPS
400.0000 mg | ORAL_CAPSULE | Freq: Two times a day (BID) | ORAL | Status: DC
  Administered 2020-08-19 – 2020-08-21 (×5): 400 mg via ORAL
  Filled 2020-08-19 (×5): qty 4

## 2020-08-19 MED ORDER — IOHEXOL 350 MG/ML SOLN
75.0000 mL | Freq: Once | INTRAVENOUS | Status: AC | PRN
  Administered 2020-08-19: 75 mL via INTRAVENOUS

## 2020-08-19 MED ORDER — MELATONIN 5 MG PO TABS
5.0000 mg | ORAL_TABLET | Freq: Every evening | ORAL | Status: DC | PRN
  Administered 2020-08-19 – 2020-08-20 (×2): 5 mg via ORAL
  Filled 2020-08-19 (×4): qty 1

## 2020-08-19 MED ORDER — ROSUVASTATIN CALCIUM 20 MG PO TABS
40.0000 mg | ORAL_TABLET | Freq: Every day | ORAL | Status: DC
  Administered 2020-08-19 – 2020-08-21 (×3): 40 mg via ORAL
  Filled 2020-08-19 (×3): qty 2

## 2020-08-19 MED ORDER — STROKE: EARLY STAGES OF RECOVERY BOOK: Freq: Once | Status: AC

## 2020-08-19 MED ORDER — MELATONIN 3 MG PO TABS
3.0000 mg | ORAL_TABLET | Freq: Every evening | ORAL | Status: DC | PRN
  Filled 2020-08-19: qty 1

## 2020-08-19 MED ORDER — DULOXETINE HCL 20 MG PO CPEP
20.0000 mg | ORAL_CAPSULE | Freq: Two times a day (BID) | ORAL | Status: DC
  Administered 2020-08-19 – 2020-08-21 (×5): 20 mg via ORAL
  Filled 2020-08-19 (×6): qty 1

## 2020-08-19 MED ORDER — SODIUM CHLORIDE 0.9 % IV SOLN: INTRAVENOUS | Status: DC

## 2020-08-19 MED ORDER — ASPIRIN EC 325 MG PO TBEC
325.0000 mg | DELAYED_RELEASE_TABLET | Freq: Every day | ORAL | Status: DC
  Administered 2020-08-19: 325 mg via ORAL
  Filled 2020-08-19: qty 1

## 2020-08-19 NOTE — ED Triage Notes (Signed)
Pt to ED via POV, sent by eye Dr. For eval of poss stroke. Pt states realized yesterday that if he covered up his L eye he is unable to see out of his R eye, pt states total loss of vision with the exception of light. Pt sent for Rt eye central artery occlusion. Pt states noted that his vision changed several hours prior to realizing he was unable to see out of R eye at all. Pt states unknown LKW.

## 2020-08-19 NOTE — ED Provider Notes (Signed)
[facility] Emergency Department Provider Note   ____________________________________________   Event Date/Time   First MD Initiated Contact with Patient 08/19/20 1116     (approximate)  I have reviewed the triage vital signs and the nursing notes.   HISTORY  Chief Complaint Loss of Vision    HPI Lucas Ramirez is a 65 y.o. male with past medical history of hypertension, hyperlipidemia, COPD, and migraines who presents to the ED for vision loss.  Patient reports that he woke up from a nap yesterday evening with nearly complete vision loss in his right eye.  He denies any associated eye pain but states he is only able to see colors and faint shapes in that eye.  He denies any pain or vision loss in his left eye and he typically has fairly good vision in his right eye.  He was initially evaluated by his ophthalmologist, Dr. 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  Patient denies any numbness or weakness in his extremities, he does have chronic left-sided Bell's palsy.  He does state that he had some "tremors" in his left arm and leg yesterday prior to the onset of vision changes.        Past Medical History:  Diagnosis Date  . Bell's palsy   . CAP (community acquired pneumonia) 06/28/2015  . COPD (chronic obstructive pulmonary disease) ([place])   . Depression   . GERD (gastroesophageal reflux disease)   . Glaucoma   . Hyperlipidemia   . Hypertension   . Migraine   . Scoliosis   . Shingles 2019   dx by dermatologist by patient report    Patient Active Problem List   Diagnosis Date Noted  . Visual loss 08/19/2020  . History of adenomatous polyp of colon 07/05/2019  . Lymphadenopathy 03/31/2019  . Vitamin B6 deficiency 11/28/2018  . COPD exacerbation ([place]) 06/28/2015  . HTN (hypertension) 06/28/2015  . Back pain, chronic 12/24/2014  . Duodenitis 12/24/2014  . Esophagitis  12/24/2014  . H/O surgical procedure 12/24/2014  . HLD (hyperlipidemia) 12/24/2014  . Headache, migraine 12/24/2014  . Neuropathy 12/24/2014  . Borderline diabetes 12/24/2014  . Scoliosis 12/24/2014  . Compulsive tobacco user syndrome 12/24/2014  . Vitamin D deficiency 12/24/2014  . Bell's palsy 12/24/2014  . Polyneuropathy 12/24/2014    Past Surgical History:  Procedure Laterality Date  . [place]   correcting Scoliosils per pt  . COLONOSCOPY WITH PROPOFOL N/A 07/04/2019   Procedure: COLONOSCOPY WITH PROPOFOL;  Surgeon: Lin Landsman, MD;  Location: [facility] ENDOSCOPY;  Service: Gastroenterology;  Laterality: N/A;  PRIORITY 4  . LUMBAR LAMINECTOMY/ DECOMPRESSION WITH MET-RX Right 10/30/2019   Procedure: L4-5 DECOMPRESSION;  Surgeon: Meade Maw, MD;  Location: [facility] ORS;  Service: Neurosurgery;  Laterality: Right;  . SKIN LESION EXCISION  06/06/2013   facial left cheek. Dr. Evorn Gong  . UPPER GI ENDOSCOPY  07/11/2008   [facility]. Dr. Donnella Sham. Impression. -LA Grade C reflux esophagitis. 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    Prior to Admission medications   Medication Sig Start Date End Date Taking? Authorizing Provider  acetaminophen (TYLENOL) 500 MG tablet Take 1 tablet (500 mg total) by mouth every 6 (six) hours as needed for mild pain. 10/30/19  Yes Zdeb, Altha Harm, NP  buprenorphine (SUBUTEX) 8 MG SUBL SL tablet Place 4 mg under the tongue 2 (two) times daily.   Yes [provider]  calcium carbonate (OSCAL)  1500 (600 Ca) MG TABS tablet Take 600 mg of elemental calcium by mouth 2 (two) times daily with a meal.   Yes [provider]  cholecalciferol (VITAMIN D) 1000 UNITS tablet Take 1,000 Units by mouth daily.   Yes [provider]  denosumab (PROLIA) 60 MG/ML SOSY injection Inject 60 mg into the skin every 6 (six) months.   Yes [provider]  DULoxetine (CYMBALTA) 20 MG capsule Take 20 mg by mouth 2  (two) times daily. Morning & afternoon   Yes Manuella Ghazi, Hemang K, MD  gabapentin (NEURONTIN) 800 MG tablet TAKE 1 TABLET BY MOUTH 3 TIMES DAILY AS NEEDED AS DIRECTED Patient taking differently: Take 800 mg by mouth 3 (three) times daily. 06/23/20  Yes Birdie Sons, MD  hydrochlorothiazide (HYDRODIURIL) 25 MG tablet TAKE 1 TABLET BY MOUTH ONCE DAILY Patient taking differently: Take 25 mg by mouth daily. 04/28/20  Yes Birdie Sons, MD  latanoprost (XALATAN) 0.005 % ophthalmic solution Place 1-2 drops into both eyes See admin instructions. Instill 1 drop into left eye at bedtime Instill 2 drops into right eye at bedtime   Yes [provider]  lisinopril (ZESTRIL) 20 MG tablet TAKE 1 TABLET BY MOUTH ONCE DAILY Patient taking differently: Take 20 mg by mouth daily. 04/20/20  Yes Birdie Sons, MD  omeprazole (PRILOSEC) 20 MG capsule TAKE 1 CAPSULE BY MOUTH ONCE DAILY Patient taking differently: Take 20 mg by mouth daily. 09/04/19  Yes Birdie Sons, MD  rosuvastatin (CRESTOR) 10 MG tablet TAKE 1 TABLET BY MOUTH ONCE DAILY Patient taking differently: Take 10 mg by mouth daily. 04/28/20  Yes Birdie Sons, MD  SF 5000 PLUS 1.1 % CREA dental cream Place 1 application onto teeth in the morning and at bedtime. 09/24/19  Yes [provider]  SUMAtriptan (IMITREX) 100 MG tablet TAKE 1 TABLET BY MOUTH AT ONSET OF HEADACHE MAY TAKE AN ADDITIONAL TABLET IN 2 HOURS IF NEEDED MAX OF 200MG/24HR Patient taking differently: Take 100 mg by mouth every 2 (two) hours as needed (migraines. (max 2 doses/24 hrs)). 10/19/17  Yes Birdie Sons, MD  clonazePAM (KLONOPIN) 1 MG tablet Take 0.5-1 tablets (0.5-1 mg total) by mouth every 6 (six) hours as needed (migraines). Patient not taking: No sig reported 01/30/20   Birdie Sons, MD  methocarbamol (ROBAXIN) 750 MG tablet Take 750 mg by mouth 4 (four) times daily. Patient not taking: No sig reported    [provider]  oxyCODONE (ROXICODONE)  5 MG immediate release tablet Take 1-2 tablets (5-10 mg total) by mouth every 4 (four) hours as needed for moderate pain or severe pain (5m for moderate pain (3-5/10), severe pain (6-10/10)). Patient not taking: No sig reported 10/30/19   ZLonell Face NP  propranolol ER (INDERAL LA) 160 MG SR capsule TAKE 1 CAPSULE BY MOUTH ONCE DAILY Patient not taking: No sig reported 01/13/20   FBirdie Sons MD  timolol (TIMOPTIC) 0.5 % ophthalmic solution Place 1 drop into the right eye daily. Patient not taking: Reported on 08/19/2020 07/15/19   [provider]    Allergies Bupropion hcl  Family History  Problem Relation Age of Onset  . Emphysema Mother   . Down syndrome Sister   . CAD Neg Hx   . Heart disease Neg Hx   . Colon cancer Neg Hx   . Prostate cancer Neg Hx     Social History Social History   Tobacco Use  . Smoking status:  Current Every Day Smoker    Packs/day: 0.10    Years: 35.00    Pack years: 3.50    Types: Cigarettes  . Smokeless tobacco: Never Used  Vaping Use  . Vaping Use: Some days  Substance Use Topics  . Alcohol use: No  . Drug use: No    Review of Systems  Constitutional: No fever/chills Eyes: Positive for visual changes. ENT: No sore throat. Cardiovascular: Denies chest pain. Respiratory: Denies shortness of breath. Gastrointestinal: No abdominal pain.  No nausea, no vomiting.  No diarrhea.  No constipation. Genitourinary: Negative for dysuria. Musculoskeletal: Negative for back pain. Skin: Negative for rash. Neurological: Negative for headaches, focal weakness or numbness.  ____________________________________________   PHYSICAL EXAM:  VITAL SIGNS: ED Triage Vitals [08/19/20 1114]  Enc Vitals Group     BP      Pulse      Resp      Temp      Temp src      SpO2      Weight 145 lb (65.8 kg)     Height 5' 6" (1.676 m)     Head Circumference      Peak Flow      Pain Score 8     Pain Loc      Pain Edu?      Excl. in [place]?      Constitutional: Alert and oriented. Eyes: Conjunctivae are normal.  Pupils dilated bilaterally.  Extraocular movements intact. Head: Atraumatic. Nose: No congestion/rhinnorhea. Mouth/Throat: Mucous membranes are moist. Neck: Normal ROM Cardiovascular: Normal rate, regular rhythm. Grossly normal heart sounds. Respiratory: Normal respiratory effort.  No retractions. Lungs CTAB. Gastrointestinal: Soft and nontender. No distention. Genitourinary: deferred Musculoskeletal: No lower extremity tenderness nor edema. Neurologic:  Normal speech and language.  Left-sided facial droop involving forehead, chronic for patient.  Remainder of cranial nerves grossly intact, no focal neurologic deficits in extremities. Skin:  Skin is warm, dry and intact. No rash noted. Psychiatric: Mood and affect are normal. Speech and behavior are normal.  ____________________________________________   LABS (all labs ordered are listed, but only abnormal results are displayed)  Labs Reviewed  CBC - Abnormal; Notable for the following components:      Result Value   Platelets 403 (*)    All other components within normal limits  COMPREHENSIVE METABOLIC PANEL - Abnormal; Notable for the following components:   Sodium 130 (*)    Chloride 92 (*)    Glucose, Bld 108 (*)    BUN 27 (*)    Creatinine, Ser 1.52 (*)    Calcium 8.6 (*)    GFR, Estimated 51 (*)    All other components within normal limits  RESP PANEL BY RT-PCR (FLU A&B, COVID) ARPGX2  PROTIME-INR  APTT  DIFFERENTIAL  URINE DRUG SCREEN, QUALITATIVE ([facility] ONLY)  URINALYSIS, ROUTINE W REFLEX MICROSCOPIC  ETHANOL   ____________________________________________  EKG  ED ECG REPORT I, Blake Divine, the attending physician, personally viewed and interpreted this ECG.   Date: 08/19/2020  EKG Time: 11:19  Rate: 78  Rhythm: normal EKG, normal sinus rhythm, unchanged from previous tracings  Axis: Normal  Intervals:none  ST&T Change:  None   PROCEDURES  Procedure(s) performed (including Critical Care):  Procedures   ____________________________________________   INITIAL IMPRESSION / ASSESSMENT AND PLAN / ED COURSE       65 year old male presents to the ED with acute onset vision loss in right eye noted upon waking from a nap in the evening.  He was diagnosed with central retinal artery occlusion by his ophthalmologist and referred to the ED for further evaluation to rule out additional stroke.  Patient does have chronic left-sided facial droop that he says has been present for 20 years and attributes to Bell's palsy.  He otherwise has no focal neurologic deficits, CT head reviewed by me and shows no obvious hemorrhage, negative for acute process per radiology.  Remainder of labs are unremarkable and case discussed with hospitalist for admission for further stroke work-up.      ____________________________________________   FINAL CLINICAL IMPRESSION(S) / ED DIAGNOSES  Final diagnoses:  Central retinal artery occlusion of right eye     ED Discharge Orders    None       Note:  This document was prepared using Dragon voice recognition software and may include unintentional dictation errors.   Blake Divine, MD 08/19/20 [phone]

## 2020-08-19 NOTE — H&P (Addendum)
History and Physical  NAOL ONTIVEROS SJG:[id number] DOB: 1956/04/08 DOA: 08/19/2020  Referring physician: Dr. Charna Archer, [place] PCP: Birdie Sons, MD  Outpatient Specialists: Neurosurgery. Patient coming from: Ophthalmologist office through home.  Chief Complaint: Right eye vision loss.  HPI: Lucas Ramirez is a 65 y.o. male with medical history significant for lumbar radiculopathy post L4-L5 decompression, ambulatory dysfunction uses a cane at baseline to ambulate due to polyneuropathy, osteoporosis, CKD 3A, essential hypertension, hyperlipidemia, migraine, who presented to [facility] ED at the recommendation of ophthalmology due to right eye vision loss noted this morning.  Patient is a poor historian.  Reports worsening blurry vision yesterday during the day, does not recall time of onset.  This morning he covered his left eye and realized that he could not see out of his right eye.  He decided to go to his ophthalmologist.  At the ophthalmologist office his eyes were examined.  He was advised to present to the ED for further evaluation and to rule out a stroke.  While in the ED, code stroke was called CT head was nonacute.  At the time of this visit patient continues to have right eye vision loss.  EDP recommended admission for stroke work-up.  He was in his usual state of health prior to this.  He admits to ongoing tobacco use 1 pack/day for the past 40 years.  No alcohol use.  No history of OSA.  No family history of stroke.  His mother is deceased, he thinks she may have had a history of heart disease.  He denies any palpitations or personal history of heart disease or stroke.  ED Course:  Afebrile.  Vital signs essentially unremarkable.  Lab studies remarkable for BUN 27, creatinine 1.53, anion gap of 11, CBC essentially unremarkable.  Twelve-lead EKG revealed sinus rhythm rate of 78, nonspecific ST-T changes.  QTc 437.  Review of Systems: Review of systems as noted in the HPI. All other systems  reviewed and are negative.   Past Medical History:  Diagnosis Date  . Bell's palsy   . CAP (community acquired pneumonia) 06/28/2015  . COPD (chronic obstructive pulmonary disease) ([place])   . Depression   . GERD (gastroesophageal reflux disease)   . Glaucoma   . Hyperlipidemia   . Hypertension   . Migraine   . Scoliosis   . Shingles 2019   dx by dermatologist by patient report   Past Surgical History:  Procedure Laterality Date  . [place]   correcting Scoliosils per pt  . COLONOSCOPY WITH PROPOFOL N/A 07/04/2019   Procedure: COLONOSCOPY WITH PROPOFOL;  Surgeon: Lin Landsman, MD;  Location: [facility] ENDOSCOPY;  Service: Gastroenterology;  Laterality: N/A;  PRIORITY 4  . LUMBAR LAMINECTOMY/ DECOMPRESSION WITH MET-RX Right 10/30/2019   Procedure: L4-5 DECOMPRESSION;  Surgeon: Meade Maw, MD;  Location: [facility] ORS;  Service: Neurosurgery;  Laterality: Right;  . SKIN LESION EXCISION  06/06/2013   facial left cheek. Dr. Evorn Gong  . UPPER GI ENDOSCOPY  07/11/2008   [facility]. Dr. Donnella Sham. Impression. -LA Grade C reflux esophagitis. 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    Social History:  reports that he has been smoking cigarettes. He has a 3.50 pack-year smoking history. He has never used smokeless tobacco. He reports that he does not drink alcohol and does not use drugs.   Allergies  Allergen Reactions  . Bupropion Hcl Other (See Comments)    seizures    Family History  Problem Relation Age of Onset  .  Emphysema Mother   . Down syndrome Sister   . CAD Neg Hx   . Heart disease Neg Hx   . Colon cancer Neg Hx   . Prostate cancer Neg Hx       Prior to Admission medications   Medication Sig Start Date End Date Taking? Authorizing Provider  acetaminophen (TYLENOL) 500 MG tablet Take 1 tablet (500 mg total) by mouth every 6 (six) hours as needed for mild pain. 10/30/19   Lonell Face, NP  cholecalciferol (VITAMIN D) 1000 UNITS tablet  Take 1,000 Units by mouth daily.    [provider]  clonazePAM (KLONOPIN) 1 MG tablet Take 0.5-1 tablets (0.5-1 mg total) by mouth every 6 (six) hours as needed (migraines). 01/30/20   Birdie Sons, MD  DULoxetine (CYMBALTA) 20 MG capsule Take 20 mg by mouth in the morning and at bedtime. Morning & afternoon    Vladimir Crofts, MD  gabapentin (NEURONTIN) 800 MG tablet TAKE 1 TABLET BY MOUTH 3 TIMES DAILY AS NEEDED AS DIRECTED 06/23/20   Birdie Sons, MD  hydrochlorothiazide (HYDRODIURIL) 25 MG tablet TAKE 1 TABLET BY MOUTH ONCE DAILY 04/28/20   Birdie Sons, MD  latanoprost (XALATAN) 0.005 % ophthalmic solution Place 1-2 drops into both eyes See admin instructions. Instill 1 drop into left eye at bedtime Instill 2 drops into right eye at bedtime    [provider]  lisinopril (ZESTRIL) 20 MG tablet TAKE 1 TABLET BY MOUTH ONCE DAILY 04/20/20   Birdie Sons, MD  methocarbamol (ROBAXIN) 750 MG tablet Take 750 mg by mouth 4 (four) times daily.    [provider]  omeprazole (PRILOSEC) 20 MG capsule TAKE 1 CAPSULE BY MOUTH ONCE DAILY Patient taking differently: Take 20 mg by mouth at bedtime.  09/04/19   Birdie Sons, MD  oxyCODONE (ROXICODONE) 5 MG immediate release tablet Take 1-2 tablets (5-10 mg total) by mouth every 4 (four) hours as needed for moderate pain or severe pain (57m for moderate pain (3-5/10), severe pain (6-10/10)). 10/30/19   ZLonell Face NP  propranolol ER (INDERAL LA) 160 MG SR capsule TAKE 1 CAPSULE BY MOUTH ONCE DAILY 01/13/20   FBirdie Sons MD  rosuvastatin (CRESTOR) 10 MG tablet TAKE 1 TABLET BY MOUTH ONCE DAILY 04/28/20   FBirdie Sons MD  SF 5000 PLUS 1.1 % CREA dental cream Place 1 application onto teeth in the morning and at bedtime. 09/24/19   [provider]  SUMAtriptan (IMITREX) 100 MG tablet TAKE 1 TABLET BY MOUTH AT ONSET OF HEADACHE MAY TAKE AN ADDITIONAL TABLET IN 2 HOURS IF NEEDED MAX OF 200MG/24HR Patient taking  differently: Take 100 mg by mouth every 2 (two) hours as needed (migraines. (max 2 doses/24 hrs)).  10/19/17   FBirdie Sons MD  timolol (TIMOPTIC) 0.5 % ophthalmic solution Place 1 drop into the right eye daily. 07/15/19   [provider]    Physical Exam: BP 135/75   Pulse 73   Temp 97.8 F (36.6 C)   Resp 10   Ht _0  (1.676 m)   Wt 65.8 kg   SpO2 99%   BMI 23.40 kg/m   . General: 65y.o. year-old male well developed well nourished in no acute distress.  Alert and oriented x3.  Right vision loss. . Cardiovascular: Regular rate and rhythm with no rubs or gallops.  No thyromegaly or JVD noted.  No lower extremity edema. 2/4 pulses in all 4 extremities. .Marland Kitchen  Respiratory: Clear to auscultation with no wheezes or rales. Good inspiratory effort. . Abdomen: Soft nontender nondistended with normal bowel sounds x4 quadrants. . Muskuloskeletal: No cyanosis, clubbing or edema noted bilaterally . Neuro: CN II-XII intact, strength, sensation, reflexes.  Right lower extremity 3 out of 5 strength.  Right vision loss. . Skin: No ulcerative lesions noted or rashes . Psychiatry: Judgement and insight appear normal. Mood is appropriate for condition and setting          Labs on Admission:  Basic Metabolic Panel: Recent Labs  Lab 08/19/20 1125  NA 130*  K 4.9  CL 92*  CO2 27  GLUCOSE 108*  BUN 27*  CREATININE 1.52*  CALCIUM 8.6*   Liver Function Tests: Recent Labs  Lab 08/19/20 1125  AST 15  ALT 11  ALKPHOS 75  BILITOT 0.5  PROT 7.6  ALBUMIN 4.3   No results for input(s): LIPASE, AMYLASE in the last 168 hours. No results for input(s): AMMONIA in the last 168 hours. CBC: Recent Labs  Lab 08/19/20 1125  WBC 9.7  NEUTROABS 7.4  HGB 13.3  HCT 39.2  MCV 86.2  PLT 403*   Cardiac Enzymes: No results for input(s): CKTOTAL, CKMB, CKMBINDEX, TROPONINI in the last 168 hours.  BNP (last 3 results) No results for input(s): BNP in the last 8760 hours.  ProBNP (last 3  results) No results for input(s): PROBNP in the last 8760 hours.  CBG: No results for input(s): GLUCAP in the last 168 hours.  Radiological Exams on Admission: CT HEAD CODE STROKE WO CONTRAST  Result Date: 08/19/2020 CLINICAL DATA:  Code stroke. Acute neuro deficit. Right sided vision loss. EXAM: CT HEAD WITHOUT CONTRAST TECHNIQUE: Contiguous axial images were obtained from the base of the skull through the vertex without intravenous contrast. COMPARISON:  CT head 02/06/2014 FINDINGS: Brain: Ventricle size and cerebral volume normal. Mild patchy white matter hypodensity bilaterally symmetric and likely chronic. No acute cortical infarct, hemorrhage, mass. Vascular: Negative for hyperdense vessel Skull: Negative Sinuses/Orbits: Paranasal sinuses clear.  Negative orbit Other: None ASPECTS ([place] Stroke Program Early CT Score) - Ganglionic level infarction (caudate, lentiform nuclei, internal capsule, insula, M1-M3 cortex): 7 - Supraganglionic infarction (M4-M6 cortex): 3 Total score (0-10 with 10 being normal): 10 IMPRESSION: 1. No acute abnormality. Bilateral white matter changes which are most likely due to chronic microvascular ischemia 2. ASPECTS is 10 3. These results were called by telephone at the time of interpretation on 08/19/2020 at 11:51 am to provider [facility] , who verbally acknowledged these results. Electronically Signed   By: Franchot Gallo M.D.   On: 08/19/2020 11:51    EKG: I independently viewed the EKG done and my findings are as followed: Sinus rhythm rate of 78.  Nonspecific ST-T changes.  QTc 437.  Assessment/Plan Present on Admission: **None**  Active Problems:   Visual loss  Right eye vision loss, rule out acute CVA. Patient is a poor historian, unclear when he was last known well. States he noted yesterday during the day that his blurry vision was worse.  He wears eyeglasses.  This morning he could not see from his right eye. Went to his ophthalmologist who  referred him to the ED to rule out a stroke. Not on aspirin at home, states he was taking Crestor and was compliant with his home medications. Tobacco user 1 pack/day x 40 years. Start full dose aspirin 325 mg daily Start high intensity statin Permissive hypertension for now, treat BP systolic greater than  308 and diastolic greater than 569. Code stroke CT head nonacute. MRI brain, CTA head and neck Lipid panel, hemoglobin A1c 2D echo with bubble study PT/OT/speech therapy evaluation Neurochecks every 4 hours NIH Fall precautions/aspiration precautions. Neurology's recommendations  Essential hypertension Permissive hypertension. Hold off home oral antihypertensives Continue to closely monitor vital signs. IV antihypertensives as needed with parameters.  Hyperlipidemia -Lipid panel Goal LDL less than 70. Start high intensity statin  Mild hypovolemic hyponatremia Serum sodium 130 IV fluid normal saline at 50 cc/h x 2 days. Repeat BMP in the morning  CKD 3A Appears to be at his baseline creatinine Avoid nephrotoxic agents Gentle IV fluid hydration for now. Monitor urine output.  Tobacco use disorder Tobacco cessation counseled at bedside.  History of migraines Monitor for now.  Polyneuropathy Resume home regimen.  Lumbar radiculopathy/ambulatory dysfunction PT OT to assess Fall precautions  Chronic anxiety/depression/GERD No acute issues. Resume home regimen.  DVT prophylaxis: Subcu Lovenox daily  Code Status: DNR stated by the patient himself.  Family Communication: None at bedside.  Patient lives at home with his wife.  Disposition Plan: Admit to MedSurg unit with remote telemetry.  Consults called: Neurology/stroke team  Admission status: Observation status.   Status is: Observation    Dispo: The patient is from: Home.              Anticipated d/c is to: Home.               Patient currently not stable for discharge, ongoing management of  suspected acute CVA.   Difficult to place patient, not applicable.       Kayleen Memos MD [facility] Pager [phone]  If 7PM-7AM, please contact night-coverage [email or web address] Password [facility]  08/19/2020, 12:53 PM

## 2020-08-19 NOTE — ED Notes (Signed)
Pt to MRI

## 2020-08-19 NOTE — Progress Notes (Signed)
SLP Cancellation Note  Patient Details Name: Lucas Ramirez MRN: [id number] DOB: 12-Jan-1956   Cancelled treatment:       Reason Eval/Treat Not Completed: SLP screened, no needs identified, will sign off (chart reviewed; consulted NSG, met w/ pt in room b/f MRI). Pt denied any difficulty swallowing and is currently on a regular diet after completing/passing the [place] swallow screen w/ NSG. Pt was hungry so graham crackers and peanut butter w/ juice given; No swallowing deficits appreciated/observed. Pt has mild Left decreased tone around lip/face; old h/o Bell's Palsy. Pt conversed at conversational level w/out deficits noted; pt and NSG present denied any speech-language deficits. Speech was fully intelligible. He did c/o deficits in R vision -- offered strategies of using finger to "trace" fully across page/book to the R while reading to ensure he read all the words/information; "trace" around the plate/tray to observe all items on R side of plate/tray; turn head to R side to assess environment.  No further skilled ST services indicated as pt appears at his baseline of Cognitive-linguistic abilities in light of new R vision deficit. Pt agreed. NSG to reconsult if any change in status while admitted.      Orinda Kenner, MS, CCC-SLP Speech Language Pathologist Rehab Services [phone] [facility] 08/19/2020, 1:38 PM

## 2020-08-19 NOTE — Evaluation (Signed)
Physical Therapy Evaluation Patient Details Name: Lucas Ramirez MRN: [id number] DOB: [birth year] Today's Date: 08/19/2020   History of Present Illness  a 65 y.o. male with medical history significant for lumbar radiculopathy post L4-L5 decompression, ambulatory dysfunction uses a cane at baseline to ambulate due to polyneuropathy, osteoporosis, CKD 3A, essential hypertension, hyperlipidemia, migraine, who presented to [facility] ED at the recommendation of ophthalmology due to right eye vision loss.  Not only does he report total vision loss in the R eye, but reports blurry vision in the L.  Clinical Impression  Pt did well with mobility and generally showed good confidence.  He was able to do prolonged bout of ambulation with community appropriate speed using cane (has limp, uses cane at baseline) that he reports was not far from his baseline, apart from change in visual input making navigation more difficult.  Pt reports that he has had at least 3 falls in the last few months with knee buckling episodes, he had one of these today (After we had walked ~250 ft) and had PT not held him up with the gait belt he would have fallen.  PT encouraged pt to use a walker, at least in the near future, and consider having a HHPT work with him to insure that this eventuality is mitigated as well as possible and to continue f/u with strategies to deal with vision changes.      Follow Up Recommendations Home health PT    Equipment Recommendations  None recommended by PT (did encourage him to use a walker, at least in t)    Recommendations for Other Services       Precautions / Restrictions Precautions Precautions:  (listed as low fall risk) Restrictions Weight Bearing Restrictions: No      Mobility  Bed Mobility Overal bed mobility: Independent             General bed mobility comments: easily gets to sitting EOB    Transfers Overall transfer level: Independent Equipment used: Straight cane              General transfer comment: rises to standing easily and safely  Ambulation/Gait Ambulation/Gait assistance: Min assist Gait Distance (Feet): 300 Feet Assistive device: Straight cane       General Gait Details: Pt with baseline limp (scoliosis/sciatica) but able to confidently ambulate around nurses station with relatively consistent speed.  He did need cuing to insure that he was turning his head to visualize obstacles, etc and this was clearly his biggest limiting factor.  Pt did, however, have a single bout of buckling that required direct PT assist to keep him from falling (this ~250 ft in) - pt reports that these bouts do happen with some regularity  Stairs            Wheelchair Mobility    Modified Rankin (Stroke Patients Only)       Balance Overall balance assessment: Mild deficits observed, not formally tested                                           Pertinent Vitals/Pain Pain Assessment:  (chronic low back and sciatic pain)    Home Living Family/patient expects to be discharged to:: Private residence Living Arrangements: Spouse/significant other;Other relatives Available Help at Discharge: Family;Available 24 hours/day   Home Access: Stairs to enter Entrance Stairs-Rails: Left Entrance Stairs-Number of Steps: 2  Home Equipment: Kasandra Knudsen - single point;Walker - 4 wheels;Walker - 2 wheels      Prior Function Level of Independence: Independent with assistive device(s)         Comments: Pt reports that he does not drive but regularly does prolonged ambulation, can manage ADLs, etc     Hand Dominance        Extremity/Trunk Assessment   Upper Extremity Assessment Upper Extremity Assessment: Overall WFL for tasks assessed    Lower Extremity Assessment Lower Extremity Assessment: Overall WFL for tasks assessed       Communication   Communication: No difficulties  Cognition Arousal/Alertness: Awake/alert Behavior During  Therapy: WFL for tasks assessed/performed Overall Cognitive Status: Within Functional Limits for tasks assessed                                        General Comments      Exercises     Assessment/Plan    PT Assessment Patient needs continued PT services  PT Problem List Decreased strength;Decreased activity tolerance;Decreased balance;Decreased coordination;Decreased safety awareness;Decreased knowledge of use of DME       PT Treatment Interventions Stair training;Functional mobility training;Gait training;DME instruction;Therapeutic activities;Therapeutic exercise;Balance training;Neuromuscular re-education;Patient/family education    PT Goals (Current goals can be found in the Care Plan section)  Acute Rehab PT Goals Patient Stated Goal: go home PT Goal Formulation: With patient Time For Goal Achievement: 09/02/20 Potential to Achieve Goals: Good    Frequency Min 2X/week   Barriers to discharge        Co-evaluation               AM-PAC PT "6 Clicks" Mobility  Outcome Measure Help needed turning from your back to your side while in a flat bed without using bedrails?: None Help needed moving from lying on your back to sitting on the side of a flat bed without using bedrails?: None Help needed moving to and from a bed to a chair (including a wheelchair)?: None Help needed standing up from a chair using your arms (e.g., wheelchair or bedside chair)?: None Help needed to walk in hospital room?: A Little Help needed climbing 3-5 steps with a railing? : A Little 6 Click Score: 22    End of Session Equipment Utilized During Treatment: Gait belt Activity Tolerance: Patient limited by fatigue Patient left: in bed;with call bell/phone within reach Nurse Communication: Mobility status PT Visit Diagnosis: Muscle weakness (generalized) (M62.81);Unsteadiness on feet (R26.81);Other abnormalities of gait and mobility (R26.89);Other symptoms and signs  involving the nervous system (R29.898)    Time: 1510-1530 PT Time Calculation (min) (ACUTE ONLY): 20 min   Charges:   PT Evaluation $PT Eval Low Complexity: 1 Low PT Treatments $Gait Training: 8-22 mins        Kreg Shropshire, DPT 08/19/2020, 4:49 PM

## 2020-08-20 DIAGNOSIS — H547 Unspecified visual loss: Secondary | ICD-10-CM | POA: Diagnosis not present

## 2020-08-20 DIAGNOSIS — H3411 Central retinal artery occlusion, right eye: Secondary | ICD-10-CM | POA: Diagnosis not present

## 2020-08-20 LAB — CBC
HCT: 33.1 % — ABNORMAL LOW (ref 39.0–52.0)
Hemoglobin: 11 g/dL — ABNORMAL LOW (ref 13.0–17.0)
MCH: 28.8 pg (ref 26.0–34.0)
MCHC: 33.2 g/dL (ref 30.0–36.0)
MCV: 86.6 fL (ref 80.0–100.0)
Platelets: 330 10*3/uL (ref 150–400)
RBC: 3.82 MIL/uL — ABNORMAL LOW (ref 4.22–5.81)
RDW: 13.5 % (ref 11.5–15.5)
WBC: 6.3 10*3/uL (ref 4.0–10.5)
nRBC: 0 % (ref 0.0–0.2)

## 2020-08-20 LAB — LIPID PANEL
Cholesterol: 105 mg/dL (ref 0–200)
HDL: 23 mg/dL — ABNORMAL LOW (ref >40)
LDL Cholesterol: 58 mg/dL (ref 0–99)
Total CHOL/HDL Ratio: 4.6 RATIO
Triglycerides: 121 mg/dL (ref <150)
VLDL: 24 mg/dL (ref 0–40)

## 2020-08-20 LAB — BASIC METABOLIC PANEL
Anion gap: 7 (ref 5–15)
BUN: 23 mg/dL (ref 8–23)
CO2: 29 mmol/L (ref 22–32)
Calcium: 7.6 mg/dL — ABNORMAL LOW (ref 8.9–10.3)
Chloride: 96 mmol/L — ABNORMAL LOW (ref 98–111)
Creatinine, Ser: 1.2 mg/dL (ref 0.61–1.24)
GFR, Estimated: >60 mL/min (ref >60)
Glucose, Bld: 103 mg/dL — ABNORMAL HIGH (ref 70–99)
Potassium: 4.8 mmol/L (ref 3.5–5.1)
Sodium: 132 mmol/L — ABNORMAL LOW (ref 135–145)

## 2020-08-20 LAB — ECHOCARDIOGRAM COMPLETE
Area-P 1/2: 3.21 cm2
Height: 66 in
S' Lateral: 2.53 cm
Weight: 2320 oz

## 2020-08-20 LAB — HEMOGLOBIN A1C
Hgb A1c MFr Bld: 5.9 % — ABNORMAL HIGH (ref 4.8–5.6)
Mean Plasma Glucose: 122.63 mg/dL

## 2020-08-20 MED ORDER — VITAMIN D 25 MCG (1000 UNIT) PO TABS
1000.0000 [IU] | ORAL_TABLET | Freq: Every day | ORAL | Status: DC
  Administered 2020-08-20 – 2020-08-21 (×2): 1000 [IU] via ORAL
  Filled 2020-08-20 (×2): qty 1

## 2020-08-20 MED ORDER — CLOPIDOGREL BISULFATE 75 MG PO TABS
75.0000 mg | ORAL_TABLET | Freq: Every day | ORAL | Status: DC
  Administered 2020-08-20 – 2020-08-21 (×2): 75 mg via ORAL
  Filled 2020-08-20 (×2): qty 1

## 2020-08-20 MED ORDER — CALCIUM CARBONATE 1250 (500 CA) MG PO TABS
1.0000 | ORAL_TABLET | Freq: Two times a day (BID) | ORAL | Status: DC
  Administered 2020-08-20 – 2020-08-21 (×2): 500 mg via ORAL
  Filled 2020-08-20 (×3): qty 1

## 2020-08-20 MED ORDER — ASPIRIN EC 81 MG PO TBEC
81.0000 mg | DELAYED_RELEASE_TABLET | Freq: Every day | ORAL | Status: DC
  Administered 2020-08-20 – 2020-08-21 (×2): 81 mg via ORAL
  Filled 2020-08-20 (×2): qty 1

## 2020-08-20 NOTE — Progress Notes (Signed)
PROGRESS NOTE    TAYVIN PRESLAR  JSE:[id number] DOB: Sep 13, 1955 DOA: 08/19/2020 PCP: Birdie Sons, MD  129A/129A-AA   Assessment & Plan:   Active Problems:   Visual loss   BENJAMEN KOELLING is a 65 y.o. male with medical history significant for lumbar radiculopathy post L4-L5 decompression, ambulatory dysfunction uses a cane at baseline to ambulate due to polyneuropathy, osteoporosis, CKD 3A, essential hypertension, hyperlipidemia, migraine, who presented to [facility] ED at the recommendation of ophthalmology due to right eye vision loss noted morning of presentation.   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 Tiny emboli vs watershed --CTA head and neck showed right ICA occlusion, occlusion of left vertebral artery and severe stenosis of the right vertebral artery --Echo wnl with no shunt --LDL 58 Plan: --ASA 81 and plavix 75 mg daily for 3 weeks, followed by ASA alone. --cont home statin --smoke cessation --given carotid artery is completely, no intervention is needed, per neuro  Essential hypertension --Hold home BP meds for now to allow permissive HTN  Hyperlipidemia Goal LDL less than 70. --cont home statin  Mild hypovolemic hyponatremia Serum sodium 130 -started on IV fluid normal saline at 50 cc/h  --d/c MIVF and encourage oral hydration  CKD 3A Appears to be at his baseline creatinine --d/c MIVF and encourage oral hydration  Tobacco use disorder Tobacco cessation counseled at bedside.  History of migraines Monitor for now.  Polyneuropathy --cont gabapentin  Lumbar radiculopathy/ambulatory dysfunction PT OT to assess Fall precautions  Chronic anxiety/depression --cont Cymbalta  GERD --cont PPI   DVT prophylaxis: Lovenox SQ Code Status: DNR  Family Communication: wife updated at bedside today Level of care: Med-Surg Dispo:   The patient is from:  home Anticipated d/c is to: home Anticipated d/c date is: tomorrow Patient currently is not medically ready to d/c due to: recommend overnight monitoring, per neuro   Subjective and Interval History:  Nursing reported pt being very distraught in the morning about his vision loss, however, was calm later and reported his vision from the right was somewhat improved.  No other neurological deficit.     Objective: Vitals:   08/20/20 1124 08/20/20 1602 08/20/20 1946 08/20/20 2338  BP: (!) 144/79 129/76 130/78 132/73  Pulse: 83 86 83 77  Resp: 19 18 18 18   Temp: 98.3 F (36.8 C) 97.8 F (36.6 C) 99.2 F (37.3 C) 98.4 F (36.9 C)  TempSrc:      SpO2: 99% 98% 98% 97%  Weight:      Height:        Intake/Output Summary (Last 24 hours) at 08/21/2020 0024 Last data filed at 08/20/2020 1853 Gross per 24 hour  Intake 240 ml  Output --  Net 240 ml   Filed Weights   08/19/20 1114  Weight: 65.8 kg    Examination:   Constitutional: NAD, AAOx3 HEENT: conjunctivae and lids normal, EOMI CV: No cyanosis.   RESP: normal respiratory effort, on RA Extremities: No effusions, edema in BLE SKIN: warm, dry Neuro: II - XII grossly intact.   Psych: Normal mood and affect.     Data Reviewed: I have personally reviewed following labs and imaging studies  CBC: Recent Labs  Lab 08/19/20 1125 08/20/20 0323  WBC 9.7 6.3  NEUTROABS 7.4  --   HGB 13.3 11.0*  HCT 39.2 33.1*  MCV 86.2 86.6  PLT 403* 073   Basic Metabolic  Panel: Recent Labs  Lab 08/19/20 1125 08/20/20 0323  NA 130* 132*  K 4.9 4.8  CL 92* 96*  CO2 27 29  GLUCOSE 108* 103*  BUN 27* 23  CREATININE 1.52* 1.20  CALCIUM 8.6* 7.6*   GFR: Estimated Creatinine Clearance: 56.1 mL/min (by C-G formula based on SCr of 1.2 mg/dL). Liver Function Tests: Recent Labs  Lab 08/19/20 1125  AST 15  ALT 11  ALKPHOS 75  BILITOT 0.5  PROT 7.6  ALBUMIN 4.3   No results for input(s): LIPASE, AMYLASE in the last 168 hours. No  results for input(s): AMMONIA in the last 168 hours. Coagulation Profile: Recent Labs  Lab 08/19/20 1125  INR 1.0   Cardiac Enzymes: No results for input(s): CKTOTAL, CKMB, CKMBINDEX, TROPONINI in the last 168 hours. BNP (last 3 results) No results for input(s): PROBNP in the last 8760 hours. HbA1C: Recent Labs    08/20/20 0323  HGBA1C 5.9*   CBG: No results for input(s): GLUCAP in the last 168 hours. Lipid Profile: Recent Labs    08/20/20 0323  CHOL 105  HDL 23*  LDLCALC 58  TRIG 121  CHOLHDL 4.6   Thyroid Function Tests: No results for input(s): TSH, T4TOTAL, FREET4, T3FREE, THYROIDAB in the last 72 hours. Anemia Panel: No results for input(s): VITAMINB12, FOLATE, FERRITIN, TIBC, IRON, RETICCTPCT in the last 72 hours. Sepsis Labs: No results for input(s): PROCALCITON, LATICACIDVEN in the last 168 hours.  Recent Results (from the past 240 hour(s))  Resp Panel by RT-PCR (Flu A&B, Covid) Nasopharyngeal Swab     Status: None   Collection Time: 08/19/20 12:16 PM   Specimen: Nasopharyngeal Swab; Nasopharyngeal(NP) swabs in vial transport medium  Result Value Ref Range Status   SARS Coronavirus 2 by RT PCR NEGATIVE NEGATIVE Final    Comment: (NOTE) SARS-CoV-2 target nucleic acids are NOT DETECTED.  The SARS-CoV-2 RNA is generally detectable in upper respiratory specimens during the acute phase of infection. The lowest concentration of SARS-CoV-2 viral copies this assay can detect is 138 copies/mL. A negative result does not preclude SARS-Cov-2 infection and should not be used as the sole basis for treatment or other patient management decisions. A negative result may occur with  improper specimen collection/handling, submission of specimen other than nasopharyngeal swab, presence of viral mutation(s) within the areas targeted by this assay, and inadequate number of viral copies(<138 copies/mL). A negative result must be combined with clinical observations, patient  history, and epidemiological information. The expected result is Negative.  Fact Sheet for Patients:  EntrepreneurPulse.com.au  Fact Sheet for Healthcare Providers:  IncredibleEmployment.be  This test is no t yet approved or cleared by the [place] FDA and  has been authorized for detection and/or diagnosis of SARS-CoV-2 by FDA under an Emergency Use Authorization (EUA). This EUA will remain  in effect (meaning this test can be used) for the duration of the COVID-19 declaration under Section 564(b)(1) of the Act, 21 U.S.C.section 360bbb-3(b)(1), unless the authorization is terminated  or revoked sooner.       Influenza A by PCR NEGATIVE NEGATIVE Final   Influenza B by PCR NEGATIVE NEGATIVE Final    Comment: (NOTE) The Xpert Xpress SARS-CoV-2/FLU/RSV plus assay is intended as an aid in the diagnosis of influenza from Nasopharyngeal swab specimens and should not be used as a sole basis for treatment. Nasal washings and aspirates are unacceptable for Xpert Xpress SARS-CoV-2/FLU/RSV testing.  Fact Sheet for Patients: EntrepreneurPulse.com.au  Fact Sheet for Healthcare Providers: IncredibleEmployment.be  This  test is not yet approved or cleared by the [place] and has been authorized for detection and/or diagnosis of SARS-CoV-2 by FDA under an Emergency Use Authorization (EUA). This EUA will remain in effect (meaning this test can be used) for the duration of the COVID-19 declaration under Section 564(b)(1) of the Act, 21 U.S.C. section 360bbb-3(b)(1), unless the authorization is terminated or revoked.  Performed at [facility], [address], [place], [place] [address]       Radiology Studies: CT ANGIO HEAD NECK W WO CM  Result Date: 08/19/2020 CLINICAL DATA:  Acute right eye vision loss. EXAM: CT ANGIOGRAPHY HEAD AND NECK TECHNIQUE: Multidetector CT imaging of the head and  neck was performed using the standard protocol during bolus administration of intravenous contrast. Multiplanar CT image reconstructions and MIPs were obtained to evaluate the vascular anatomy. Carotid stenosis measurements (when applicable) are obtained utilizing NASCET criteria, using the distal internal carotid diameter as the denominator. CONTRAST:  58mL OMNIPAQUE IOHEXOL 350 MG/ML SOLN COMPARISON:  None. FINDINGS: CTA NECK FINDINGS Aortic arch: Standard 3 vessel aortic arch with a small to moderate amount of calcified and soft plaque. Patent brachiocephalic and subclavian arteries without evidence of significant stenosis. Right carotid system: The common carotid artery is widely patent. There is calcified and soft plaque at the carotid bifurcation with occlusion of the ICA at its origin. Left carotid system: Patent with a small amount of calcified plaque at the carotid bifurcation. No evidence of significant stenosis or dissection. Vertebral arteries: The right vertebral artery is patent and dominant with calcified and soft plaque at its origin resulting in severe stenosis. The left vertebral artery is occluded at its origin with distal reconstitution of diminutive V2 and V3 segments. Skeleton: Advanced upper cervical facet arthrosis. Moderate disc degeneration at C3-4. Other neck: No evidence of cervical lymphadenopathy or mass. Upper chest: Biapical lung scarring. Review of the MIP images confirms the above findings CTA HEAD FINDINGS Anterior circulation: There is reconstitution of the right ICA beginning in the petrous segment. There is mild stenosis of the distal right petrous ICA. The intracranial left ICA is patent with mild nonstenotic atherosclerotic plaque. ACAs and MCAs are patent without evidence of a proximal branch occlusion or significant proximal stenosis. No aneurysm is identified. Posterior circulation: The intracranial right vertebral artery is widely patent and supplies the basilar. The  intracranial left vertebral artery is occluded. The basilar artery is widely patent. There is a small left posterior communicating artery. Both PCAs are patent without evidence of a significant proximal stenosis. No aneurysm is identified. Venous sinuses: The left transverse sinus appears hypoplastic. Absent enhancement of the distal left transverse sinus and entirety of the left sigmoid sinus is favored to be due to early contrast timing given normal flow voids on today's MRI. Anatomic variants: None. Review of the MIP images confirms the above findings IMPRESSION: 1. 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 2. Occlusion of the left vertebral artery at its origin with reconstitution of diminutive V2 and V3 segments and reocclusion of the V4 segment. 3. Severe stenosis of the right vertebral artery at its origin. 4. Aortic Atherosclerosis (ICD10-I70.0). Electronically Signed   By: Logan Bores M.D.   On: 08/19/2020 15:22   MR BRAIN WO CONTRAST  Result Date: 08/19/2020 CLINICAL DATA:  Right eye vision loss. EXAM: MRI HEAD WITHOUT CONTRAST TECHNIQUE: Multiplanar, multiecho pulse sequences of the brain and surrounding structures were obtained without intravenous contrast. COMPARISON:  Head CT and  head and neck CTA 08/19/2020 FINDINGS: Brain: There are at least 6 or 7 punctate foci of mild trace diffusion weighted signal hyperintensity involving cortex and white matter in the posterior right frontal and parietal lobes. These largely have the appearance of subacute infarcts with 1 or 2 possibly demonstrating mildly reduced ADC. Patchy T2 hyperintensities in the cerebral white matter bilaterally and in the pons are nonspecific but compatible with moderately age advanced chronic small vessel ischemic disease. There are chronic lacunar infarcts in the right centrum semiovale and left caudate/left internal capsule. There is mild diffuse dural thickening over both cerebral convexities. The  ventricles are normal in size. A punctate chronic infarct is noted in the left cerebellar hemisphere. Vascular: Abnormal appearance of the right internal carotid artery which is occluded on today's CTA. Skull and upper cervical spine: Unremarkable bone marrow signal. Sinuses/Orbits: Unremarkable orbits. Clear paranasal sinuses. Small left mastoid effusion. Other: None. IMPRESSION: 1. Punctate subacute right frontal and parietal infarcts. 2. Moderate chronic small vessel ischemic disease. 3. Right internal carotid artery occlusion.  See separate CTA. 4. Diffuse dural thickening over both cerebral convexities, nonspecific though can be seen with recent lumbar puncture or other causes of intracranial hypotension, previous subdural hemorrhage, or infectious/other inflammatory etiologies. Electronically Signed   By: Logan Bores M.D.   On: 08/19/2020 15:09   ECHOCARDIOGRAM COMPLETE  Result Date: 08/20/2020    ECHOCARDIOGRAM REPORT   Patient Name:   KAHLIF BERKMAN Date of Exam: 08/19/2020 Medical Rec #:  [id number]     Height:       66.0 in Accession #:    [id number]    Weight:       145.0 lb Date of Birth:  [birth year]     BSA:          1.744 m Patient Age:    25 years      BP:           157/74 mmHg Patient Gender: M             HR:           97 bpm. Exam Location:  [facility] Procedure: 2D Echo, Cardiac Doppler and Color Doppler Indications:     Q21.1 PFO  History:         Patient has no prior history of Echocardiogram examinations.                  Risk Factors:Hypertension and Dyslipidemia. COPD. Scoliosis.  Sonographer:     Wilford Sports Rodgers-Jones Referring Phys:  [id number] [place] Diagnosing Phys: Serafina Royals MD IMPRESSIONS  1. Left ventricular ejection fraction, by estimation, is 60 to 65%. The left ventricle has normal function. The left ventricle has no regional wall motion abnormalities. Left ventricular diastolic parameters were normal.  2. Right ventricular systolic function is normal. The right ventricular  size is normal.  3. The mitral valve is normal in structure. Trivial mitral valve regurgitation.  4. The aortic valve is normal in structure. Aortic valve regurgitation is not visualized. FINDINGS  Left Ventricle: Left ventricular ejection fraction, by estimation, is 60 to 65%. The left ventricle has normal function. The left ventricle has no regional wall motion abnormalities. The left ventricular internal cavity size was normal in size. There is  no left ventricular hypertrophy. Left ventricular diastolic parameters were normal. Right Ventricle: The right ventricular size is normal. No increase in right ventricular wall thickness. Right ventricular systolic function is normal. Left Atrium: Left atrial size was  normal in size. Right Atrium: Right atrial size was normal in size. Pericardium: There is no evidence of pericardial effusion. Mitral Valve: The mitral valve is normal in structure. Trivial mitral valve regurgitation. Tricuspid Valve: The tricuspid valve is normal in structure. Tricuspid valve regurgitation is trivial. Aortic Valve: The aortic valve is normal in structure. Aortic valve regurgitation is not visualized. Pulmonic Valve: The pulmonic valve was normal in structure. Pulmonic valve regurgitation is not visualized. Aorta: The aortic root and ascending aorta are structurally normal, with no evidence of dilitation. IAS/Shunts: No atrial level shunt detected by color flow Doppler.  LEFT VENTRICLE PLAX 2D LVIDd:         4.13 cm Diastology LVIDs:         2.53 cm LV e' medial:    6.99 cm/s LV PW:         0.91 cm LV E/e' medial:  8.6 LV IVS:        0.81 cm LV e' lateral:   12.00 cm/s                        LV E/e' lateral: 5.0  RIGHT VENTRICLE RV Basal diam:  2.62 cm RV S prime:     11.40 cm/s TAPSE (M-mode): 1.0 cm LEFT ATRIUM             Index LA diam:        4.20 cm 2.41 cm/m LA Vol (A2C):   28.0 ml 16.05 ml/m LA Vol (A4C):   30.8 ml 17.66 ml/m LA Biplane Vol: 31.0 ml 17.77 ml/m   AORTA Ao Root  diam: 3.70 cm MITRAL VALVE MV Area (PHT): 3.21 cm MV Decel Time: 236 msec MV E velocity: 60.00 cm/s MV A velocity: 87.80 cm/s MV E/A ratio:  0.68 Serafina Royals MD Electronically signed by Serafina Royals MD Signature Date/Time: 08/20/2020/7:40:52 AM    Final    CT HEAD CODE STROKE WO CONTRAST  Result Date: 08/19/2020 CLINICAL DATA:  Code stroke. Acute neuro deficit. Right sided vision loss. EXAM: CT HEAD WITHOUT CONTRAST TECHNIQUE: Contiguous axial images were obtained from the base of the skull through the vertex without intravenous contrast. COMPARISON:  CT head 02/06/2014 FINDINGS: Brain: Ventricle size and cerebral volume normal. Mild patchy white matter hypodensity bilaterally symmetric and likely chronic. No acute cortical infarct, hemorrhage, mass. Vascular: Negative for hyperdense vessel Skull: Negative Sinuses/Orbits: Paranasal sinuses clear.  Negative orbit Other: None ASPECTS ([place] Stroke Program Early CT Score) - Ganglionic level infarction (caudate, lentiform nuclei, internal capsule, insula, M1-M3 cortex): 7 - Supraganglionic infarction (M4-M6 cortex): 3 Total score (0-10 with 10 being normal): 10 IMPRESSION: 1. No acute abnormality. Bilateral white matter changes which are most likely due to chronic microvascular ischemia 2. ASPECTS is 10 3. These results were called by telephone at the time of interpretation on 08/19/2020 at 11:51 am to provider [facility] , who verbally acknowledged these results. Electronically Signed   By: Franchot Gallo M.D.   On: 08/19/2020 11:51     Scheduled Meds: . aspirin EC  81 mg Oral Daily  . calcium carbonate  1 tablet Oral BID WC  . cholecalciferol  1,000 Units Oral Daily  . clopidogrel  75 mg Oral Daily  . DULoxetine  20 mg Oral BID  . enoxaparin (LOVENOX) injection  40 mg Subcutaneous Q24H  . gabapentin  400 mg Oral BID  . pantoprazole  40 mg Oral Daily  . rosuvastatin  40 mg Oral Daily  Continuous Infusions:   LOS: 0 days     Enzo Bi,  MD [facility] If 7PM-7AM, please contact night-coverage 08/21/2020, 12:24 AM

## 2020-08-20 NOTE — Progress Notes (Signed)
Patient alert and oriented x4, pupils equal but response to light is not equal an reactive on the right side. Left side is a 3, round and brisk while the right is a 3, very sluggish almost fixed. No baseline to compare. Notified MD; per MD patient was seen by Opthalmology and eyes dilated. Will continue to monitor for change.

## 2020-08-20 NOTE — Progress Notes (Signed)
   08/20/20 1143  Clinical Encounter Type  Visited With Patient and family together  Visit Type Initial;Spiritual support;Social support  Referral From Nurse  Consult/Referral To Chaplain   Chaplain responded to spiritual consult from nurse. PT was able to express his feelings of depression, because of his changing health. PT feels sad and discouraged about his changing health. Wife was also able to express her feelings of concern, regarding the Pt's mood. Chaplain ministered with presence and emotionally supported the PT and his wife. Chaplain will follow up with PT tomorrow.

## 2020-08-20 NOTE — Progress Notes (Signed)
PT Cancellation Note  Patient Details Name: Lucas Ramirez MRN: [id number] DOB: 01/05/1956   Cancelled Treatment:    Reason Eval/Treat Not Completed: Patient declined, no reason specified Attempted to see pt this afternoon.  He declines and reports that he feels confident with walking but doesn't want to do anything right now.  He reports some minimal improvement in vision, but still only vaguely making out shapes with R eye with no detail/resolution.  PT mentioned again about using walker (as opposed to his baseline cane) when he first gets home and he agreed and assured me he would.    Kreg Shropshire, DPT 08/20/2020, 5:38 PM

## 2020-08-20 NOTE — Consult Note (Signed)
Neurology Consultation Reason for Consult: Stroke Referring Physician: Lolita Cram  CC: Vision Change  History is obtained from: Patient  HPI: Lucas Ramirez is a 65 y.o. male who first noticed a vision change on Tuesday. Yesterday, due to continued decreased vision in his right eye, he sought care at his eye doctor who referred him to the ER after diagnosing CRAO. As part of his workup, he had an MRI which showed ICA occlusion and some strokes in that distribution.    LKW: Tuesday tpa given?: no, out of window  NIHSS score: 3 1A: Level of Consciousness - 0 1B: Ask Month and Age - 0 1C: 'Blink Eyes' & 'Squeeze Hands' - 0 2: Test Horizontal Extraocular Movements - 0 3: Test Visual Fields - 0 4: Test Facial Palsy - 1 5A: Test Left Arm Motor Drift - 0 5B: Test Right Arm Motor Drift - 0 6A: Test Left Leg Motor Drift - 0 6B: Test Right Leg Motor Drift - 1 7: Test Limb Ataxia - 0 8: Test Sensation - 1 9: Test Language/Aphasia- 0 10: Test Dysarthria - 0 11: Test Extinction/Inattention - 0   ROS: A 14 point ROS was performed and is negative except as noted in the HPI.    Past Medical History:  Diagnosis Date  . Bell's palsy   . CAP (community acquired pneumonia) 06/28/2015  . COPD (chronic obstructive pulmonary disease) (Echo)   . Depression   . GERD (gastroesophageal reflux disease)   . Glaucoma   . Hyperlipidemia   . Hypertension   . Migraine   . Scoliosis   . Shingles 2019   dx by dermatologist by patient report     Family History  Problem Relation Age of Onset  . Emphysema Mother   . Down syndrome Sister   . CAD Neg Hx   . Heart disease Neg Hx   . Colon cancer Neg Hx   . Prostate cancer Neg Hx      Social History:  reports that he has been smoking cigarettes. He has a 3.50 pack-year smoking history. He has never used smokeless tobacco. He reports that he does not drink alcohol and does not use drugs.   Exam: Current vital signs: BP (!) 144/79 (BP Location: Right  Arm)   Pulse 83   Temp 98.3 F (36.8 C)   Resp 19   Ht 5\' 6"  (1.676 m)   Wt 65.8 kg   SpO2 99%   BMI 23.40 kg/m  Vital signs in last 24 hours: Temp:  [97.5 F (36.4 C)-98.3 F (36.8 C)] 98.3 F (36.8 C) (04/28 1124) Pulse Rate:  [72-84] 83 (04/28 1124) Resp:  [11-19] 19 (04/28 1124) BP: (117-158)/(66-92) 144/79 (04/28 1124) SpO2:  [97 %-100 %] 99 % (04/28 1124)   Physical Exam  Constitutional: Appears well-developed and well-nourished.  Psych: Affect appropriate to situation Eyes: No scleral injection HENT: No OP obstruction MSK: no joint deformities.  Cardiovascular: Normal rate and regular rhythm.  Respiratory: Effort normal, non-labored breathing GI: Soft.  No distension. There is no tenderness.  Skin: WDI  Neuro: Mental Status: Patient is awake, alert, oriented to person, place, month, year, and situation. Patient is able to give a clear and coherent history. No signs of aphasia or neglect Cranial Nerves: II: Visual Fields are full in the left eye, hand waving in the right eye. Pupils are equal, round, and reactive to light.   III,IV, VI: EOMI without ptosis or diploplia.  V: Facial sensation is symmetric  to temperature VII: Facial movement with left sided weakness including forehead(old bell's palsy per pt and wife)  VIII: hearing is intact to voice X: Uvula elevates symmetrically XI: Shoulder shrug is symmetric. XII: tongue is midline without atrophy or fasciculations.  Motor: Tone is normal. Bulk is normal. 5/5 strength was present in bilateral arms and left leg, he has 4/5 knee extension and hip flexion on the right(long standing per patient). Sensory: Sensation is diminished on the lateral aspect of the right leg. Otherwise intact, noextinction to DSS Cerebellar: FNF intact bilaterally  I have reviewed labs in epic and the results pertinent to this consultation are:  Na 132 Cr 1.2 LDL 58  I have reviewed the images obtained:MRI - punctate infarcts in  the R ICA distribution,  ? Tiny emboli vs watershed  Impression: 65 yo M with what I suspect was an embolic CRAO at the time of carotid occlusion. I suspect it was acute on chronic given his minimal symptoms. He is now asymptomatic at normotension and is 48 hours from onset of visual symptoms. Given it is completely occluded and he is not symptomatic, I would not pursue any interventional procedure. He will need continued statin therapy and I would favor dual antiplatelet therapy x 3 weeks.   Recommendations: 1) asa 81mg  and plavix 75mg  daily x 3 weeks. 2) Continue statin as LDL is at goal 3) smoking cessation advised.  4) could cautiously continue to restart BP meds 5) Echo 6) will follow   Roland Rack, MD [facility] [phone]  If 7pm- 7am, please page neurology on call as listed in [place].

## 2020-08-20 NOTE — Evaluation (Signed)
Occupational Therapy Evaluation Patient Details Name: Lucas Ramirez MRN: [id number] DOB: 12-19-1955 Today's Date: 08/20/2020    History of Present Illness a 65 y.o. male with medical history significant for lumbar radiculopathy post L4-L5 decompression, ambulatory dysfunction uses a cane at baseline to ambulate due to polyneuropathy, osteoporosis, CKD 3A, essential hypertension, hyperlipidemia, migraine, who presented to [facility] ED at the recommendation of ophthalmology due to right eye vision loss.  Not only does he report total vision loss in the R eye, but reports blurry vision in the L.   Clinical Impression   Pt seen for OT evaluation this date to f/u re: safety with ADLs/ADL mobility. Pt reports being INDEP at baseline for self care. Pt presents this date with decreased vision being his primary limiting factor in terms of safe ADL performance. Pt requires SUPV including verbal cueing for safely negotiating unfamiliar environments. That being said, pt does report lessened blurring of vision in L eye this date and with L eye occluded, he is able to see and name some large objects in room, showing some improvement. OT educates pt and spouse re: integrating increased contrast into pathways of home to improve pt registerring objects/pathways to maintain safety and avoid obstacles. In addition, OT educates pt re: pain mgt r/t c/o chronic sciatic nerve pain and mobilization to reduce pressure/tension. Pt and spouse with good understanding of safety considerations and pain mgt considerations. Do not anticipate further OT needs at this time. Will complete order.   Follow Up Recommendations  No OT follow up    Equipment Recommendations  None recommended by OT    Recommendations for Other Services       Precautions / Restrictions Precautions Precaution Comments: low fall Restrictions Weight Bearing Restrictions: No      Mobility Bed Mobility Overal bed mobility: Independent                   Transfers Overall transfer level: Modified independent Equipment used: Straight cane                  Balance Overall balance assessment: Mild deficits observed, not formally tested                                         ADL either performed or assessed with clinical judgement   ADL Overall ADL's : Modified independent;Needs assistance/impaired                                       General ADL Comments: needs assistance with ADL transfers and fxl mobility purely d/t being unfamiliar with surroundings in the hospital room, needs assist for cueing for obstacle avoidance intermittently when it is in his R visual field. He is able to negotiate wide open spaces fairly well and states the blurring of his L eye appears to be better today.     Vision Patient Visual Report: Other (comment) (pt reports that he feel some R side vision has returned when OT occludes his L eye. Pt states that blurring of L vision appears better today as well.) Vision Assessment?: Yes (Overall still describing low vision in his R eye and is noted to have poor obstacle avoidance in R visual field requiring cues for safety.) Eye Alignment: Within Functional Limits Ocular Range of Motion: Within Functional Limits  Alignment/Gaze Preference: Within Defined Limits Tracking/Visual Pursuits: Requires cues, head turns, or add eye shifts to track Additional Comments: vision loss reportedly somewhat better per patient today. Still generally low vision, but he is able to detect big objects with R eye. Pt able to read board with R eye occluded only using L eye and reports that L eye vision is no longer blurred.     Perception     Praxis      Pertinent Vitals/Pain Pain Assessment: Faces Faces Pain Scale: Hurts little more Pain Location: chronic sciatic pain Pain Descriptors / Indicators: Grimacing Pain Intervention(s): Limited activity within patient's tolerance;Monitored during  session     Hand Dominance     Extremity/Trunk Assessment Upper Extremity Assessment Upper Extremity Assessment: Overall WFL for tasks assessed (sensation and [place] in tact and equal bilaterally.)   Lower Extremity Assessment Lower Extremity Assessment: Overall WFL for tasks assessed       Communication Communication Communication: No difficulties   Cognition Arousal/Alertness: Awake/alert Behavior During Therapy: WFL for tasks assessed/performed Overall Cognitive Status: Within Functional Limits for tasks assessed                                     General Comments       Exercises Other Exercises Other Exercises: OT educates pt and spouse re: considering increased contrast for walls/doors and walkways of the home. Eliminating unnecessary fall hazards or furniture that is near pathways such as end tables. Pt and spouse with good udnerstanding. In addition, OT encourages pt movement as he c/o sciatic pain. Notified that increased time sitting can actually further enflame area surrounding the nerve leading to increased pain and irritation.   Shoulder Instructions      Home Living Family/patient expects to be discharged to:: Private residence Living Arrangements: Spouse/significant other;Other relatives Available Help at Discharge: Family;Available 24 hours/day   Home Access: Stairs to enter Entrance Stairs-Number of Steps: 2 Entrance Stairs-Rails: Left                 Home Equipment: Cane - single point;Walker - 4 wheels;Walker - 2 wheels          Prior Functioning/Environment Level of Independence: Independent with assistive device(s)        Comments: Pt reports that he does not drive but regularly does prolonged ambulation, can manage ADLs, etc        OT Problem List: Impaired vision/perception      OT Treatment/Interventions: Self-care/ADL training    OT Goals(Current goals can be found in the care plan section) Acute Rehab OT  Goals Patient Stated Goal: go home OT Goal Formulation: All assessment and education complete, DC therapy  OT Frequency: Min 1X/week   Barriers to D/C:            Co-evaluation              AM-PAC OT "6 Clicks" Daily Activity     Outcome Measure Help from another person eating meals?: None Help from another person taking care of personal grooming?: None Help from another person toileting, which includes using toliet, bedpan, or urinal?: A Little Help from another person bathing (including washing, rinsing, drying)?: A Little Help from another person to put on and taking off regular upper body clothing?: None Help from another person to put on and taking off regular lower body clothing?: None 6 Click Score: 22   End of  Session Equipment Utilized During Treatment: Other (comment) (cane) Nurse Communication: Mobility status  Activity Tolerance: Patient tolerated treatment well Patient left: in bed;with call bell/phone within reach;with family/visitor present  OT Visit Diagnosis: Low vision, both eyes (H54.2)                Time: 3013-1438 OT Time Calculation (min): 27 min Charges:  OT General Charges $OT Visit: 1 Visit OT Treatments $Self Care/Home Management : 8-22 mins  Gerrianne Scale, [place], OTR/L ascom [phone] 08/20/20, 3:20 PM

## 2020-08-21 ENCOUNTER — Telehealth: Payer: Self-pay | Admitting: Family Medicine

## 2020-08-21 DIAGNOSIS — H409 Unspecified glaucoma: Secondary | ICD-10-CM | POA: Diagnosis present

## 2020-08-21 DIAGNOSIS — Z79899 Other long term (current) drug therapy: Secondary | ICD-10-CM | POA: Diagnosis not present

## 2020-08-21 DIAGNOSIS — N1831 Chronic kidney disease, stage 3a: Secondary | ICD-10-CM | POA: Diagnosis present

## 2020-08-21 DIAGNOSIS — M810 Age-related osteoporosis without current pathological fracture: Secondary | ICD-10-CM | POA: Diagnosis present

## 2020-08-21 DIAGNOSIS — Z66 Do not resuscitate: Secondary | ICD-10-CM | POA: Diagnosis present

## 2020-08-21 DIAGNOSIS — F32A Depression, unspecified: Secondary | ICD-10-CM | POA: Diagnosis present

## 2020-08-21 DIAGNOSIS — F17210 Nicotine dependence, cigarettes, uncomplicated: Secondary | ICD-10-CM | POA: Diagnosis present

## 2020-08-21 DIAGNOSIS — Z20822 Contact with and (suspected) exposure to covid-19: Secondary | ICD-10-CM | POA: Diagnosis present

## 2020-08-21 DIAGNOSIS — I129 Hypertensive chronic kidney disease with stage 1 through stage 4 chronic kidney disease, or unspecified chronic kidney disease: Secondary | ICD-10-CM | POA: Diagnosis present

## 2020-08-21 DIAGNOSIS — H3411 Central retinal artery occlusion, right eye: Secondary | ICD-10-CM | POA: Diagnosis present

## 2020-08-21 DIAGNOSIS — R269 Unspecified abnormalities of gait and mobility: Secondary | ICD-10-CM | POA: Diagnosis present

## 2020-08-21 DIAGNOSIS — M5416 Radiculopathy, lumbar region: Secondary | ICD-10-CM | POA: Diagnosis present

## 2020-08-21 DIAGNOSIS — F419 Anxiety disorder, unspecified: Secondary | ICD-10-CM | POA: Diagnosis present

## 2020-08-21 DIAGNOSIS — Z825 Family history of asthma and other chronic lower respiratory diseases: Secondary | ICD-10-CM | POA: Diagnosis not present

## 2020-08-21 DIAGNOSIS — E871 Hypo-osmolality and hyponatremia: Secondary | ICD-10-CM | POA: Diagnosis present

## 2020-08-21 DIAGNOSIS — H547 Unspecified visual loss: Secondary | ICD-10-CM | POA: Diagnosis present

## 2020-08-21 DIAGNOSIS — I63131 Cerebral infarction due to embolism of right carotid artery: Secondary | ICD-10-CM | POA: Diagnosis present

## 2020-08-21 DIAGNOSIS — G43909 Migraine, unspecified, not intractable, without status migrainosus: Secondary | ICD-10-CM | POA: Diagnosis present

## 2020-08-21 DIAGNOSIS — J449 Chronic obstructive pulmonary disease, unspecified: Secondary | ICD-10-CM | POA: Diagnosis present

## 2020-08-21 DIAGNOSIS — K219 Gastro-esophageal reflux disease without esophagitis: Secondary | ICD-10-CM | POA: Diagnosis present

## 2020-08-21 DIAGNOSIS — Z888 Allergy status to other drugs, medicaments and biological substances status: Secondary | ICD-10-CM | POA: Diagnosis not present

## 2020-08-21 DIAGNOSIS — H5461 Unqualified visual loss, right eye, normal vision left eye: Secondary | ICD-10-CM | POA: Diagnosis present

## 2020-08-21 DIAGNOSIS — E785 Hyperlipidemia, unspecified: Secondary | ICD-10-CM | POA: Diagnosis present

## 2020-08-21 DIAGNOSIS — E861 Hypovolemia: Secondary | ICD-10-CM | POA: Diagnosis present

## 2020-08-21 DIAGNOSIS — R29703 NIHSS score 3: Secondary | ICD-10-CM | POA: Diagnosis present

## 2020-08-21 DIAGNOSIS — I639 Cerebral infarction, unspecified: Secondary | ICD-10-CM | POA: Diagnosis present

## 2020-08-21 LAB — CBC
HCT: 33.2 % — ABNORMAL LOW (ref 39.0–52.0)
Hemoglobin: 11.5 g/dL — ABNORMAL LOW (ref 13.0–17.0)
MCH: 29.6 pg (ref 26.0–34.0)
MCHC: 34.6 g/dL (ref 30.0–36.0)
MCV: 85.3 fL (ref 80.0–100.0)
Platelets: 318 10*3/uL (ref 150–400)
RBC: 3.89 MIL/uL — ABNORMAL LOW (ref 4.22–5.81)
RDW: 13.3 % (ref 11.5–15.5)
WBC: 5.7 10*3/uL (ref 4.0–10.5)
nRBC: 0 % (ref 0.0–0.2)

## 2020-08-21 LAB — BASIC METABOLIC PANEL
Anion gap: 8 (ref 5–15)
BUN: 21 mg/dL (ref 8–23)
CO2: 26 mmol/L (ref 22–32)
Calcium: 7.6 mg/dL — ABNORMAL LOW (ref 8.9–10.3)
Chloride: 96 mmol/L — ABNORMAL LOW (ref 98–111)
Creatinine, Ser: 1.24 mg/dL (ref 0.61–1.24)
GFR, Estimated: >60 mL/min (ref >60)
Glucose, Bld: 104 mg/dL — ABNORMAL HIGH (ref 70–99)
Potassium: 4.3 mmol/L (ref 3.5–5.1)
Sodium: 130 mmol/L — ABNORMAL LOW (ref 135–145)

## 2020-08-21 LAB — MAGNESIUM: Magnesium: 2 mg/dL (ref 1.7–2.4)

## 2020-08-21 MED ORDER — ASPIRIN 81 MG PO TBEC: 81.0000 mg | DELAYED_RELEASE_TABLET | Freq: Every day | ORAL | Status: DC

## 2020-08-21 MED ORDER — CLOPIDOGREL BISULFATE 75 MG PO TABS: 75.0000 mg | ORAL_TABLET | Freq: Every day | ORAL | 0 refills | Status: AC

## 2020-08-21 MED ORDER — BUPRENORPHINE HCL-NALOXONE HCL 8-2 MG SL SUBL
1.0000 | SUBLINGUAL_TABLET | Freq: Every day | SUBLINGUAL | Status: DC
  Administered 2020-08-21: 1 via SUBLINGUAL
  Filled 2020-08-21: qty 1

## 2020-08-21 NOTE — Discharge Summary (Signed)
Physician Discharge Summary   Lucas Ramirez  male DOB: [birth year]  [name]  PCP: Birdie Sons, MD  Admit date: 08/19/2020 Discharge date: 08/21/2020  Admitted From: home Disposition:  home Home Health: Yes CODE STATUS: DNR  Discharge Instructions    Discharge instructions   Complete by: As directed    Please take aspirin 81 and plavix 75 mg together for 3 weeks, and then after that, just aspirin 81 daily.  You have many occlusions in your brain arteries.  Please stop smoking.   Dr. Enzo Bi [facility] Course:  For full details, please see H&P, progress notes, consult notes and ancillary notes.  Briefly,  Lucas Severe Horneis a 65 y.o.malewith medical history significant forlumbar radiculopathy post L4-L5 decompression, ambulatory dysfunction uses a cane at baseline to ambulate due to polyneuropathy, osteoporosis, CKD 3A, essential hypertension, hyperlipidemia, migraine, who presented to [facility] ED at the recommendation of ophthalmology due to right eye vision loss noted morning of presentation.   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 --CTA head and neck showed right ICA occlusion, occlusion of left vertebral artery and severe stenosis of the right vertebral artery --Echo wnl with no shunt --LDL 58 --Neurology consulted and recommended ASA 81 and plavix 75 mg daily for 3 weeks, followed by ASA alone. --cont home statin --smoke cessation --given carotid artery is completely ocluded, no intervention is needed, per neuro  Essential hypertension --home BP meds held during hospitalization to allow permissive HTN.  Resume home HCTZ and Lisinopril after discharge.  Hyperlipidemia Goal LDL less than 70, at goal. --cont home statin  Mild hypovolemic hyponatremia Serum sodium 130.  S/p gentle IVF hydration.  CKD 3A Appears to  be at his baseline creatinine  Tobacco use disorder Tobacco cessation counseled at bedside.  History of migraines Monitor for now.  Polyneuropathy --cont gabapentin  Lumbar radiculopathy/ambulatory dysfunction PT rec HHPT  Chronic anxiety/depression --cont Cymbalta  GERD --cont PPI   Discharge Diagnoses:  Active Problems:   Visual loss     Discharge Instructions:  Allergies as of 08/21/2020      Reactions   Bupropion Hcl Other (See Comments)   seizures      Medication List    STOP taking these medications   clonazePAM 1 MG tablet Commonly known as: KLONOPIN   methocarbamol 750 MG tablet Commonly known as: ROBAXIN   oxyCODONE 5 MG immediate release tablet Commonly known as: Roxicodone   propranolol ER 160 MG SR capsule Commonly known as: INDERAL LA   timolol 0.5 % ophthalmic solution Commonly known as: TIMOPTIC     TAKE these medications   acetaminophen 500 MG tablet Commonly known as: TYLENOL Take 1 tablet (500 mg total) by mouth every 6 (six) hours as needed for mild pain.   aspirin 81 MG EC tablet Take 1 tablet (81 mg total) by mouth daily. Swallow whole.   buprenorphine 8 MG Subl SL tablet Commonly known as: SUBUTEX Place 4 mg under the tongue 2 (two) times daily.   calcium carbonate 1500 (600 Ca) MG Tabs tablet Commonly known as: OSCAL Take 600 mg of elemental calcium by mouth 2 (two) times daily with a meal.   cholecalciferol 1000 units tablet Commonly known as: VITAMIN D Take 1,000 Units by mouth daily.   clopidogrel 75 MG tablet Commonly known as: PLAVIX Take 1  tablet (75 mg total) by mouth daily for 21 days.   denosumab 60 MG/ML Sosy injection Commonly known as: PROLIA Inject 60 mg into the skin every 6 (six) months.   DULoxetine 20 MG capsule Commonly known as: CYMBALTA Take 20 mg by mouth 2 (two) times daily. Morning & afternoon   gabapentin 800 MG tablet Commonly known as: NEURONTIN TAKE 1 TABLET BY MOUTH 3 TIMES  DAILY AS NEEDED AS DIRECTED What changed: See the new instructions.   hydrochlorothiazide 25 MG tablet Commonly known as: HYDRODIURIL TAKE 1 TABLET BY MOUTH ONCE DAILY   latanoprost 0.005 % ophthalmic solution Commonly known as: XALATAN Place 1-2 drops into both eyes See admin instructions. Instill 1 drop into left eye at bedtime Instill 2 drops into right eye at bedtime   lisinopril 20 MG tablet Commonly known as: ZESTRIL TAKE 1 TABLET BY MOUTH ONCE DAILY   omeprazole 20 MG capsule Commonly known as: PRILOSEC TAKE 1 CAPSULE BY MOUTH ONCE DAILY   rosuvastatin 10 MG tablet Commonly known as: CRESTOR TAKE 1 TABLET BY MOUTH ONCE DAILY   SF 5000 Plus 1.1 % Crea dental cream Generic drug: sodium fluoride Place 1 application onto teeth in the morning and at bedtime.   SUMAtriptan 100 MG tablet Commonly known as: IMITREX TAKE 1 TABLET BY MOUTH AT ONSET OF HEADACHE MAY TAKE AN ADDITIONAL TABLET IN 2 HOURS IF NEEDED MAX OF 200MG /24HR What changed: See the new instructions.        Follow-up Information    Birdie Sons, MD. Schedule an appointment as soon as possible for a visit in 1 week(s).   Specialty: Family Medicine Contact information: [address] [place] [address] [phone]               Allergies  Allergen Reactions  . Bupropion Hcl Other (See Comments)    seizures     The results of significant diagnostics from this hospitalization (including imaging, microbiology, ancillary and laboratory) are listed below for reference.   Consultations:   Procedures/Studies: CT ANGIO HEAD NECK W WO CM  Result Date: 08/19/2020 CLINICAL DATA:  Acute right eye vision loss. EXAM: CT ANGIOGRAPHY HEAD AND NECK TECHNIQUE: Multidetector CT imaging of the head and neck was performed using the standard protocol during bolus administration of intravenous contrast. Multiplanar CT image reconstructions and MIPs were obtained to evaluate the vascular  anatomy. Carotid stenosis measurements (when applicable) are obtained utilizing NASCET criteria, using the distal internal carotid diameter as the denominator. CONTRAST:  29mL OMNIPAQUE IOHEXOL 350 MG/ML SOLN COMPARISON:  None. FINDINGS: CTA NECK FINDINGS Aortic arch: Standard 3 vessel aortic arch with a small to moderate amount of calcified and soft plaque. Patent brachiocephalic and subclavian arteries without evidence of significant stenosis. Right carotid system: The common carotid artery is widely patent. There is calcified and soft plaque at the carotid bifurcation with occlusion of the ICA at its origin. Left carotid system: Patent with a small amount of calcified plaque at the carotid bifurcation. No evidence of significant stenosis or dissection. Vertebral arteries: The right vertebral artery is patent and dominant with calcified and soft plaque at its origin resulting in severe stenosis. The left vertebral artery is occluded at its origin with distal reconstitution of diminutive V2 and V3 segments. Skeleton: Advanced upper cervical facet arthrosis. Moderate disc degeneration at C3-4. Other neck: No evidence of cervical lymphadenopathy or mass. Upper chest: Biapical lung scarring. Review of the MIP images confirms the above findings CTA HEAD  FINDINGS Anterior circulation: There is reconstitution of the right ICA beginning in the petrous segment. There is mild stenosis of the distal right petrous ICA. The intracranial left ICA is patent with mild nonstenotic atherosclerotic plaque. ACAs and MCAs are patent without evidence of a proximal branch occlusion or significant proximal stenosis. No aneurysm is identified. Posterior circulation: The intracranial right vertebral artery is widely patent and supplies the basilar. The intracranial left vertebral artery is occluded. The basilar artery is widely patent. There is a small left posterior communicating artery. Both PCAs are patent without evidence of a  significant proximal stenosis. No aneurysm is identified. Venous sinuses: The left transverse sinus appears hypoplastic. Absent enhancement of the distal left transverse sinus and entirety of the left sigmoid sinus is favored to be due to early contrast timing given normal flow voids on today's MRI. Anatomic variants: None. Review of the MIP images confirms the above findings IMPRESSION: 1. 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 2. Occlusion of the left vertebral artery at its origin with reconstitution of diminutive V2 and V3 segments and reocclusion of the V4 segment. 3. Severe stenosis of the right vertebral artery at its origin. 4. Aortic Atherosclerosis (ICD10-I70.0). Electronically Signed   By: Logan Bores M.D.   On: 08/19/2020 15:22   MR BRAIN WO CONTRAST  Result Date: 08/19/2020 CLINICAL DATA:  Right eye vision loss. EXAM: MRI HEAD WITHOUT CONTRAST TECHNIQUE: Multiplanar, multiecho pulse sequences of the brain and surrounding structures were obtained without intravenous contrast. COMPARISON:  Head CT and head and neck CTA 08/19/2020 FINDINGS: Brain: There are at least 6 or 7 punctate foci of mild trace diffusion weighted signal hyperintensity involving cortex and white matter in the posterior right frontal and parietal lobes. These largely have the appearance of subacute infarcts with 1 or 2 possibly demonstrating mildly reduced ADC. Patchy T2 hyperintensities in the cerebral white matter bilaterally and in the pons are nonspecific but compatible with moderately age advanced chronic small vessel ischemic disease. There are chronic lacunar infarcts in the right centrum semiovale and left caudate/left internal capsule. There is mild diffuse dural thickening over both cerebral convexities. The ventricles are normal in size. A punctate chronic infarct is noted in the left cerebellar hemisphere. Vascular: Abnormal appearance of the right internal carotid artery which is occluded  on today's CTA. Skull and upper cervical spine: Unremarkable bone marrow signal. Sinuses/Orbits: Unremarkable orbits. Clear paranasal sinuses. Small left mastoid effusion. Other: None. IMPRESSION: 1. Punctate subacute right frontal and parietal infarcts. 2. Moderate chronic small vessel ischemic disease. 3. Right internal carotid artery occlusion.  See separate CTA. 4. Diffuse dural thickening over both cerebral convexities, nonspecific though can be seen with recent lumbar puncture or other causes of intracranial hypotension, previous subdural hemorrhage, or infectious/other inflammatory etiologies. Electronically Signed   By: Logan Bores M.D.   On: 08/19/2020 15:09   ECHOCARDIOGRAM COMPLETE  Result Date: 08/20/2020    ECHOCARDIOGRAM REPORT   Patient Name:   DEZJUAN COPUS Date of Exam: 08/19/2020 Medical Rec #:  [id number]     Height:       66.0 in Accession #:    [id number]    Weight:       145.0 lb Date of Birth:  [birth year]     BSA:          1.744 m Patient Age:    65 years      BP:           157/74  mmHg Patient Gender: M             HR:           97 bpm. Exam Location:  [facility] Procedure: 2D Echo, Cardiac Doppler and Color Doppler Indications:     Q21.1 PFO  History:         Patient has no prior history of Echocardiogram examinations.                  Risk Factors:Hypertension and Dyslipidemia. COPD. Scoliosis.  Sonographer:     Wilford Sports Rodgers-Jones Referring Phys:  [id number] [place] Diagnosing Phys: Serafina Royals MD IMPRESSIONS  1. Left ventricular ejection fraction, by estimation, is 60 to 65%. The left ventricle has normal function. The left ventricle has no regional wall motion abnormalities. Left ventricular diastolic parameters were normal.  2. Right ventricular systolic function is normal. The right ventricular size is normal.  3. The mitral valve is normal in structure. Trivial mitral valve regurgitation.  4. The aortic valve is normal in structure. Aortic valve regurgitation is not visualized.  FINDINGS  Left Ventricle: Left ventricular ejection fraction, by estimation, is 60 to 65%. The left ventricle has normal function. The left ventricle has no regional wall motion abnormalities. The left ventricular internal cavity size was normal in size. There is  no left ventricular hypertrophy. Left ventricular diastolic parameters were normal. Right Ventricle: The right ventricular size is normal. No increase in right ventricular wall thickness. Right ventricular systolic function is normal. Left Atrium: Left atrial size was normal in size. Right Atrium: Right atrial size was normal in size. Pericardium: There is no evidence of pericardial effusion. Mitral Valve: The mitral valve is normal in structure. Trivial mitral valve regurgitation. Tricuspid Valve: The tricuspid valve is normal in structure. Tricuspid valve regurgitation is trivial. Aortic Valve: The aortic valve is normal in structure. Aortic valve regurgitation is not visualized. Pulmonic Valve: The pulmonic valve was normal in structure. Pulmonic valve regurgitation is not visualized. Aorta: The aortic root and ascending aorta are structurally normal, with no evidence of dilitation. IAS/Shunts: No atrial level shunt detected by color flow Doppler.  LEFT VENTRICLE PLAX 2D LVIDd:         4.13 cm Diastology LVIDs:         2.53 cm LV e' medial:    6.99 cm/s LV PW:         0.91 cm LV E/e' medial:  8.6 LV IVS:        0.81 cm LV e' lateral:   12.00 cm/s                        LV E/e' lateral: 5.0  RIGHT VENTRICLE RV Basal diam:  2.62 cm RV S prime:     11.40 cm/s TAPSE (M-mode): 1.0 cm LEFT ATRIUM             Index LA diam:        4.20 cm 2.41 cm/m LA Vol (A2C):   28.0 ml 16.05 ml/m LA Vol (A4C):   30.8 ml 17.66 ml/m LA Biplane Vol: 31.0 ml 17.77 ml/m   AORTA Ao Root diam: 3.70 cm MITRAL VALVE MV Area (PHT): 3.21 cm MV Decel Time: 236 msec MV E velocity: 60.00 cm/s MV A velocity: 87.80 cm/s MV E/A ratio:  0.68 Serafina Royals MD Electronically signed by  Serafina Royals MD Signature Date/Time: 08/20/2020/7:40:52 AM    Final    CT HEAD CODE STROKE  WO CONTRAST  Result Date: 08/19/2020 CLINICAL DATA:  Code stroke. Acute neuro deficit. Right sided vision loss. EXAM: CT HEAD WITHOUT CONTRAST TECHNIQUE: Contiguous axial images were obtained from the base of the skull through the vertex without intravenous contrast. COMPARISON:  CT head 02/06/2014 FINDINGS: Brain: Ventricle size and cerebral volume normal. Mild patchy white matter hypodensity bilaterally symmetric and likely chronic. No acute cortical infarct, hemorrhage, mass. Vascular: Negative for hyperdense vessel Skull: Negative Sinuses/Orbits: Paranasal sinuses clear.  Negative orbit Other: None ASPECTS ([place] Stroke Program Early CT Score) - Ganglionic level infarction (caudate, lentiform nuclei, internal capsule, insula, M1-M3 cortex): 7 - Supraganglionic infarction (M4-M6 cortex): 3 Total score (0-10 with 10 being normal): 10 IMPRESSION: 1. No acute abnormality. Bilateral white matter changes which are most likely due to chronic microvascular ischemia 2. ASPECTS is 10 3. These results were called by telephone at the time of interpretation on 08/19/2020 at 11:51 am to provider [facility] , who verbally acknowledged these results. Electronically Signed   By: Franchot Gallo M.D.   On: 08/19/2020 11:51      Labs: BNP (last 3 results) No results for input(s): BNP in the last 8760 hours. Basic Metabolic Panel: Recent Labs  Lab 08/19/20 1125 08/20/20 0323 08/21/20 0519  NA 130* 132* 130*  K 4.9 4.8 4.3  CL 92* 96* 96*  CO2 27 29 26   GLUCOSE 108* 103* 104*  BUN 27* 23 21  CREATININE 1.52* 1.20 1.24  CALCIUM 8.6* 7.6* 7.6*  MG  --   --  2.0   Liver Function Tests: Recent Labs  Lab 08/19/20 1125  AST 15  ALT 11  ALKPHOS 75  BILITOT 0.5  PROT 7.6  ALBUMIN 4.3   No results for input(s): LIPASE, AMYLASE in the last 168 hours. No results for input(s): AMMONIA in the last 168  hours. CBC: Recent Labs  Lab 08/19/20 1125 08/20/20 0323 08/21/20 0519  WBC 9.7 6.3 5.7  NEUTROABS 7.4  --   --   HGB 13.3 11.0* 11.5*  HCT 39.2 33.1* 33.2*  MCV 86.2 86.6 85.3  PLT 403* 330 318   Cardiac Enzymes: No results for input(s): CKTOTAL, CKMB, CKMBINDEX, TROPONINI in the last 168 hours. BNP: Invalid input(s): POCBNP CBG: No results for input(s): GLUCAP in the last 168 hours. D-Dimer No results for input(s): DDIMER in the last 72 hours. Hgb A1c Recent Labs    08/20/20 0323  HGBA1C 5.9*   Lipid Profile Recent Labs    08/20/20 0323  CHOL 105  HDL 23*  LDLCALC 58  TRIG 121  CHOLHDL 4.6   Thyroid function studies No results for input(s): TSH, T4TOTAL, T3FREE, THYROIDAB in the last 72 hours.  Invalid input(s): FREET3 Anemia work up No results for input(s): VITAMINB12, FOLATE, FERRITIN, TIBC, IRON, RETICCTPCT in the last 72 hours. Urinalysis    Component Value Date/Time   COLORURINE YELLOW 10/24/2019 0913   APPEARANCEUR CLEAR 10/24/2019 0913   APPEARANCEUR Clear 02/01/2014 1824   LABSPEC 1.020 10/24/2019 0913   LABSPEC 1.006 02/01/2014 1824   PHURINE 5.5 10/24/2019 0913   GLUCOSEU NEGATIVE 10/24/2019 0913   GLUCOSEU Negative 02/01/2014 1824   HGBUR NEGATIVE 10/24/2019 0913   BILIRUBINUR NEGATIVE 10/24/2019 0913   BILIRUBINUR Negative 02/01/2014 1824   KETONESUR NEGATIVE 10/24/2019 0913   PROTEINUR NEGATIVE 10/24/2019 0913   NITRITE NEGATIVE 10/24/2019 0913   LEUKOCYTESUR NEGATIVE 10/24/2019 0913   LEUKOCYTESUR Negative 02/01/2014 1824   Sepsis Labs Invalid input(s): PROCALCITONIN,  WBC,  LACTICIDVEN Microbiology Recent  Results (from the past 240 hour(s))  Resp Panel by RT-PCR (Flu A&B, Covid) Nasopharyngeal Swab     Status: None   Collection Time: 08/19/20 12:16 PM   Specimen: Nasopharyngeal Swab; Nasopharyngeal(NP) swabs in vial transport medium  Result Value Ref Range Status   SARS Coronavirus 2 by RT PCR NEGATIVE NEGATIVE Final     Comment: (NOTE) SARS-CoV-2 target nucleic acids are NOT DETECTED.  The SARS-CoV-2 RNA is generally detectable in upper respiratory specimens during the acute phase of infection. The lowest concentration of SARS-CoV-2 viral copies this assay can detect is 138 copies/mL. A negative result does not preclude SARS-Cov-2 infection and should not be used as the sole basis for treatment or other patient management decisions. A negative result may occur with  improper specimen collection/handling, submission of specimen other than nasopharyngeal swab, presence of viral mutation(s) within the areas targeted by this assay, and inadequate number of viral copies(<138 copies/mL). A negative result must be combined with clinical observations, patient history, and epidemiological information. The expected result is Negative.  Fact Sheet for Patients:  EntrepreneurPulse.com.au  Fact Sheet for Healthcare Providers:  IncredibleEmployment.be  This test is no t yet approved or cleared by the [place] FDA and  has been authorized for detection and/or diagnosis of SARS-CoV-2 by FDA under an Emergency Use Authorization (EUA). This EUA will remain  in effect (meaning this test can be used) for the duration of the COVID-19 declaration under Section 564(b)(1) of the Act, 21 U.S.C.section 360bbb-3(b)(1), unless the authorization is terminated  or revoked sooner.       Influenza A by PCR NEGATIVE NEGATIVE Final   Influenza B by PCR NEGATIVE NEGATIVE Final    Comment: (NOTE) The Xpert Xpress SARS-CoV-2/FLU/RSV plus assay is intended as an aid in the diagnosis of influenza from Nasopharyngeal swab specimens and should not be used as a sole basis for treatment. Nasal washings and aspirates are unacceptable for Xpert Xpress SARS-CoV-2/FLU/RSV testing.  Fact Sheet for Patients: EntrepreneurPulse.com.au  Fact Sheet for Healthcare  Providers: IncredibleEmployment.be  This test is not yet approved or cleared by the [place] FDA and has been authorized for detection and/or diagnosis of SARS-CoV-2 by FDA under an Emergency Use Authorization (EUA). This EUA will remain in effect (meaning this test can be used) for the duration of the COVID-19 declaration under Section 564(b)(1) of the Act, 21 U.S.C. section 360bbb-3(b)(1), unless the authorization is terminated or revoked.  Performed at [facility], [address] [place], [place] [address]      Total time spend on discharging this patient, including the last patient exam, discussing the hospital stay, instructions for ongoing care as it relates to all pertinent caregivers, as well as preparing the medical discharge records, prescriptions, and/or referrals as applicable, is 40 minutes.    Enzo Bi, MD  [facility] 08/21/2020, 8:37 AM

## 2020-08-21 NOTE — TOC Transition Note (Signed)
Transition of Care [facility]) - CM/SW Discharge Note   Patient Details  Name: Lucas Ramirez MRN: [id number] Date of Birth: 1955/06/29  Transition of [facility]) CM/SW Contact:  Pete Pelt, RN Phone Number: 08/21/2020, 9:14 AM   Clinical Narrative:   TOC in to see patient, is discharging today.  Patient lives at home with wife, no concerns about getting to appointments or getting medications.  Home health for PT ordered, Brittney at [facility] accepted patient.  Patient amenable. Smoking cessation resources given. Patient states no further complaints.      Final next level of care: Home w Home Health Services Barriers to Discharge: Barriers Resolved   Patient Goals and CMS Choice     Choice offered to / list presented to : NA  Discharge Placement PASRR number recieved:  (n/a)                Name of family member notified: Bivens,[name] (Spouse)   [phone] (Mobile)-notified of d/c by RN Patient and family notified of of transfer: 08/21/20  Discharge Plan and Services                          HH Arranged: PT [facility] Agency: [facility] Date [facility] Agency Contacted: 08/21/20 Time [place]: [id number] Representative spoke with at [place]: [place] ([place]) Interventions     Readmission Risk Interventions No flowsheet data found.

## 2020-08-21 NOTE — Progress Notes (Signed)
Pt discharged at this time via wheelchair accompanied by transport.  Wearing his own clothes.  Has all belonging.  Gold colored wedding band on his finger,  Apple blue watch in his hand.  Pt received all discharge instructions including golden dnr paper.  Paper verbalizes understanding of all instruction received.

## 2020-08-21 NOTE — Telephone Encounter (Signed)
patient was discharged from [facility] 4/29, but hospital follow up is not scheduled until 08-08-2020. He needs to have hospital follow up within 2 weeks of discharge. can you see if he can come in on 08-31-2020 at 3:40 instead

## 2020-08-31 ENCOUNTER — Other Ambulatory Visit: Payer: Self-pay

## 2020-08-31 ENCOUNTER — Ambulatory Visit (INDEPENDENT_AMBULATORY_CARE_PROVIDER_SITE_OTHER): Payer: BC Managed Care – PPO | Admitting: Family Medicine

## 2020-08-31 VITALS — BP 134/60 | HR 90 | Ht 65.0 in | Wt 147.2 lb

## 2020-08-31 DIAGNOSIS — R609 Edema, unspecified: Secondary | ICD-10-CM | POA: Diagnosis not present

## 2020-08-31 DIAGNOSIS — I6930 Unspecified sequelae of cerebral infarction: Secondary | ICD-10-CM

## 2020-08-31 DIAGNOSIS — I10 Essential (primary) hypertension: Secondary | ICD-10-CM

## 2020-08-31 DIAGNOSIS — I679 Cerebrovascular disease, unspecified: Secondary | ICD-10-CM

## 2020-08-31 DIAGNOSIS — E785 Hyperlipidemia, unspecified: Secondary | ICD-10-CM

## 2020-08-31 MED ORDER — ROSUVASTATIN CALCIUM 20 MG PO TABS: 20.0000 mg | ORAL_TABLET | Freq: Every day | ORAL | 3 refills | Status: DC

## 2020-08-31 MED ORDER — ROSUVASTATIN CALCIUM 20 MG PO TABS: 20.0000 mg | ORAL_TABLET | Freq: Every day | ORAL | Status: DC

## 2020-08-31 MED ORDER — CHANTIX STARTING MONTH PAK 0.5 MG X 11 & 1 MG X 42 PO TABS: ORAL_TABLET | ORAL | 0 refills | Status: DC

## 2020-08-31 NOTE — Progress Notes (Signed)
Established patient visit   Patient: Lucas Ramirez   DOB: 1956-04-07   65 y.o. Male  MRN: [id number] Visit Date: 08/31/2020  Today's healthcare provider: Lelon Huh, MD   Chief Complaint  Patient presents with  . Hospitalization Follow-up   Subjective    HPI  Follow up Hospitalization  Patient was admitted to [facility] on 08/19/2020 and discharged on 08/21/2020. 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  Treatment for this included starting both aspirin 81 and plavix 75 mg together for 3 weeks, and then after that, just aspirin 81 daily. Telephone follow up was done on 08/21/2020 He reports excellent compliance with treatment. He reports this condition is stayed the same. He is trying to quit smoking. He is tolerating medication with no adverse effects.   ----------------------------------------------------------------------------------------- -       Medications: Outpatient Medications Prior to Visit  Medication Sig  . acetaminophen (TYLENOL) 500 MG tablet Take 1 tablet (500 mg total) by mouth every 6 (six) hours as needed for mild pain.  Marland Kitchen aspirin EC 81 MG EC tablet Take 1 tablet (81 mg total) by mouth daily. Swallow whole.  . buprenorphine (SUBUTEX) 8 MG SUBL SL tablet Place 4 mg under the tongue 2 (two) times daily.  . calcium carbonate (OSCAL) 1500 (600 Ca) MG TABS tablet Take 600 mg of elemental calcium by mouth 2 (two) times daily with a meal.  . cholecalciferol (VITAMIN D) 1000 UNITS tablet Take 1,000 Units by mouth daily.  . clopidogrel (PLAVIX) 75 MG tablet Take 1 tablet (75 mg total) by mouth daily for 21 days.  Marland Kitchen denosumab (PROLIA) 60 MG/ML SOSY injection Inject 60 mg into the skin every 6 (six) months.  . DULoxetine (CYMBALTA) 20 MG capsule Take 20 mg by mouth 2  (two) times daily. Morning & afternoon  . gabapentin (NEURONTIN) 800 MG tablet TAKE 1 TABLET BY MOUTH 3 TIMES DAILY AS NEEDED AS DIRECTED (Patient taking differently: Take 800 mg by mouth 3 (three) times daily.)  . hydrochlorothiazide (HYDRODIURIL) 25 MG tablet TAKE 1 TABLET BY MOUTH ONCE DAILY (Patient taking differently: Take 25 mg by mouth daily.)  . latanoprost (XALATAN) 0.005 % ophthalmic solution Place 1-2 drops into both eyes See admin instructions. Instill 1 drop into left eye at bedtime Instill 2 drops into right eye at bedtime  . lisinopril (ZESTRIL) 20 MG tablet TAKE 1 TABLET BY MOUTH ONCE DAILY (Patient taking differently: Take 20 mg by mouth daily.)  . omeprazole (PRILOSEC) 20 MG capsule TAKE 1 CAPSULE BY MOUTH ONCE DAILY (Patient taking differently: Take 20 mg by mouth daily.)  . rosuvastatin (CRESTOR) 10 MG tablet TAKE 1 TABLET BY MOUTH ONCE DAILY (Patient taking differently: Take 10 mg by mouth daily.)  . SF 5000 PLUS 1.1 % CREA dental cream Place 1 application onto teeth in the morning and at bedtime.  . SUMAtriptan (IMITREX) 100 MG tablet TAKE 1 TABLET BY MOUTH AT ONSET OF HEADACHE MAY TAKE AN ADDITIONAL TABLET IN 2 HOURS IF NEEDED MAX OF 200MG /24HR (Patient taking differently: Take 100 mg by mouth every 2 (two) hours as needed (migraines. (max 2 doses/24 hrs)).)   No facility-administered medications prior to visit.        Objective    BP 134/60   Pulse 90   Ht 5'  5" (1.651 m)   Wt 147 lb 3.2 oz (66.8 kg)   SpO2 100%   BMI 24.50 kg/m     Physical Exam    General: Appearance:    Well developed, well nourished male in no acute distress  Eyes:    PERRL, conjunctiva/corneas clear, EOM's intact       Lungs:     Clear to auscultation bilaterally, respirations unlabored  Heart:    Normal heart rate. Normal rhythm. No murmurs, rubs, or gallops.   MS:   All extremities are intact.         Assessment & Plan     1. Cerebrovascular disease Had been on 10mg  rosuvastatin  prior to CVA, but lipids only recently coming under good control. Other risk factors include chronic hypertension and long smoking history. Will increase from 10mg  to  rosuvastatin (CRESTOR) 20 MG tablet; Take 1 tablet (20 mg total) by mouth daily.  Dispense: 90 tablet; Refill: 3 - CBC - Refer vascular surgery.   Is to continue working on smoking cessation. He was not on any platelet inhibitors prior to CVA. Will finish 3 weeks DUAP as per neurology, will then continue on ECASA indefinitely. GI is protected by daily omeprazole which he was taking prior to CVA.   2. Edema, unspecified type He's only noticed recently.  No pain or redness - TSH - Comprehensive metabolic panel  3. History of CVA with residual deficit Likely has permanent vision loss. Is schedule for ophthalmology follow up  4. Hyperlipidemia, unspecified hyperlipidemia type Increase rosuvastatin as above.   5. Primary hypertension Relatively well controlled. Continue current medications.    6. Tobacco abuse.  - varenicline (CHANTIX STARTING MONTH PAK) 0.5 MG X 11 & 1 MG X 42 tablet; Take one 0.5 mg tab by mouth once daily for 3 days, then take one 0.5 mg tab twice daily for 4 days, then take one 1 mg tab twice daily.  Dispense: 53 tablet; Refill: 0         The entirety of the information documented in the History of Present Illness, Review of Systems and Physical Exam were personally obtained by me. Portions of this information were initially documented by the CMA and reviewed by me for thoroughness and accuracy.      Lelon Huh, MD  [facility] [phone] (phone) [phone] (fax)  [place]

## 2020-09-01 LAB — COMPREHENSIVE METABOLIC PANEL
ALT: 11 IU/L (ref 0–44)
AST: 16 IU/L (ref 0–40)
Albumin/Globulin Ratio: 2.1 (ref 1.2–2.2)
Albumin: 4.4 g/dL (ref 3.8–4.8)
Alkaline Phosphatase: 76 IU/L (ref 44–121)
BUN/Creatinine Ratio: 12 (ref 10–24)
BUN: 17 mg/dL (ref 8–27)
Bilirubin Total: <0.2 mg/dL (ref 0.0–1.2)
CO2: 25 mmol/L (ref 20–29)
Calcium: 8 mg/dL — ABNORMAL LOW (ref 8.6–10.2)
Chloride: 91 mmol/L — ABNORMAL LOW (ref 96–106)
Creatinine, Ser: 1.45 mg/dL — ABNORMAL HIGH (ref 0.76–1.27)
Globulin, Total: 2.1 g/dL (ref 1.5–4.5)
Glucose: 94 mg/dL (ref 65–99)
Potassium: 4.9 mmol/L (ref 3.5–5.2)
Sodium: 128 mmol/L — ABNORMAL LOW (ref 134–144)
Total Protein: 6.5 g/dL (ref 6.0–8.5)
eGFR: 54 mL/min/{1.73_m2} — ABNORMAL LOW (ref >59)

## 2020-09-01 LAB — CBC
Hematocrit: 36.4 % — ABNORMAL LOW (ref 37.5–51.0)
Hemoglobin: 12.3 g/dL — ABNORMAL LOW (ref 13.0–17.7)
MCH: 29.1 pg (ref 26.6–33.0)
MCHC: 33.8 g/dL (ref 31.5–35.7)
MCV: 86 fL (ref 79–97)
Platelets: 332 10*3/uL (ref 150–450)
RBC: 4.23 x10E6/uL (ref 4.14–5.80)
RDW: 13.7 % (ref 11.6–15.4)
WBC: 7.5 10*3/uL (ref 3.4–10.8)

## 2020-09-01 LAB — TSH: TSH: 1.56 u[IU]/mL (ref 0.450–4.500)

## 2020-09-07 ENCOUNTER — Inpatient Hospital Stay: Payer: Self-pay | Admitting: Family Medicine

## 2020-09-07 LAB — VITAMIN D 25 HYDROXY (VIT D DEFICIENCY, FRACTURES): Vit D, 25-Hydroxy: 39.8 ng/mL (ref 30.0–100.0)

## 2020-09-07 LAB — MAGNESIUM: Magnesium: 2.1 mg/dL (ref 1.6–2.3)

## 2020-09-07 LAB — SPECIMEN STATUS REPORT

## 2020-09-08 ENCOUNTER — Encounter: Payer: Self-pay | Admitting: Family Medicine

## 2020-09-11 ENCOUNTER — Telehealth: Payer: Self-pay

## 2020-09-11 ENCOUNTER — Other Ambulatory Visit: Payer: Self-pay | Admitting: Family Medicine

## 2020-09-11 DIAGNOSIS — I10 Essential (primary) hypertension: Secondary | ICD-10-CM

## 2020-09-11 MED ORDER — AMLODIPINE BESYLATE 2.5 MG PO TABS: 2.5000 mg | ORAL_TABLET | Freq: Every day | ORAL | 0 refills | Status: DC

## 2020-09-11 NOTE — Telephone Encounter (Signed)
Advised patient as below. He verbally understands treatment plan. Medication was sent into the pharmacy. Appt was scheduled for recheck.

## 2020-09-11 NOTE — Telephone Encounter (Signed)
Requested Prescriptions  Pending Prescriptions Disp Refills  . omeprazole (PRILOSEC) 20 MG capsule [Pharmacy Med Name: OMEPRAZOLE DR 20 MG CAP] 30 capsule 11    Sig: TAKE 1 CAPSULE BY MOUTH ONCE DAILY     Gastroenterology: Proton Pump Inhibitors Passed - 09/11/2020  4:59 PM      Passed - Valid encounter within last 12 months    Recent Outpatient Visits          1 week ago Cerebrovascular disease   [facility] Birdie Sons, MD   9 months ago Essential hypertension   [facility] Birdie Sons, MD   11 months ago Essential hypertension   [facility] Birdie Sons, MD   11 months ago Migraine without aura and without status migrainosus, not intractable   [place], Adriana M, [place]   1 year ago Essential hypertension   [facility], Kirstie Peri, MD      Future Appointments            In 1 month Fisher, Kirstie Peri, MD [facility], [facility]           . hydrochlorothiazide (HYDRODIURIL) 25 MG tablet [Pharmacy Med Name: HYDROCHLOROTHIAZIDE 25 MG TAB] 90 tablet 1    Sig: TAKE 1 TABLET BY MOUTH ONCE DAILY     Cardiovascular: Diuretics - Thiazide Failed - 09/11/2020  4:59 PM      Failed - Ca in normal range and within 360 days    Calcium  Date Value Ref Range Status  08/31/2020 8.0 (L) 8.6 - 10.2 mg/dL Final   Calcium, Total  Date Value Ref Range Status  02/01/2014 8.4 (L) 8.5 - 10.1 mg/dL Final         Failed - Cr in normal range and within 360 days    Creatinine  Date Value Ref Range Status  02/01/2014 1.06 0.60 - 1.30 mg/dL Final   Creatinine, Ser  Date Value Ref Range Status  08/31/2020 1.45 (H) 0.76 - 1.27 mg/dL Final         Failed - Na in normal range and within 360 days    Sodium  Date Value Ref Range Status  08/31/2020 128 (L) 134 - 144 mmol/L Final  02/01/2014 140 136 - 145 mmol/L Final         Passed - K in normal range and within 360 days    Potassium   Date Value Ref Range Status  08/31/2020 4.9 3.5 - 5.2 mmol/L Final  02/01/2014 3.6 3.5 - 5.1 mmol/L Final         Passed - Last BP in normal range    BP Readings from Last 1 Encounters:  08/31/20 134/60         Passed - Valid encounter within last 6 months    Recent Outpatient Visits          1 week ago Cerebrovascular disease   [facility] Birdie Sons, MD   9 months ago Essential hypertension   [facility] Birdie Sons, MD   11 months ago Essential hypertension   [facility] Birdie Sons, MD   11 months ago Migraine without aura and without status migrainosus, not intractable   [facility], Wendee Beavers, [place]   1 year ago Essential hypertension   [facility] Birdie Sons, MD      Future Appointments  In 1 month Fisher, Kirstie Peri, MD [facility], [facility]           . lisinopril (ZESTRIL) 20 MG tablet [Pharmacy Med Name: LISINOPRIL 20 MG TAB] 90 tablet 1    Sig: TAKE 1 TABLET BY MOUTH ONCE DAILY     Cardiovascular:  ACE Inhibitors Failed - 09/11/2020  4:59 PM      Failed - Cr in normal range and within 180 days    Creatinine  Date Value Ref Range Status  02/01/2014 1.06 0.60 - 1.30 mg/dL Final   Creatinine, Ser  Date Value Ref Range Status  08/31/2020 1.45 (H) 0.76 - 1.27 mg/dL Final         Passed - K in normal range and within 180 days    Potassium  Date Value Ref Range Status  08/31/2020 4.9 3.5 - 5.2 mmol/L Final  02/01/2014 3.6 3.5 - 5.1 mmol/L Final         Passed - Patient is not pregnant      Passed - Last BP in normal range    BP Readings from Last 1 Encounters:  08/31/20 134/60         Passed - Valid encounter within last 6 months    Recent Outpatient Visits          1 week ago Cerebrovascular disease   [facility] Birdie Sons, MD   9 months ago Essential hypertension   [facility] Birdie Sons, MD   11 months ago Essential hypertension   [facility] Birdie Sons, MD   11 months ago Migraine without aura and without status migrainosus, not intractable   [facility], Wendee Beavers, [place]   1 year ago Essential hypertension   [place], Kirstie Peri, MD      Future Appointments            In 1 month Fisher, Kirstie Peri, MD [facility], [place]

## 2020-09-11 NOTE — Telephone Encounter (Signed)
-----   Message from Birdie Sons, MD sent at 09/09/2020  2:14 PM EDT ----- Sodium levels are too low, rest of labs are good. This is probably a side effect of hctz. Need to stop hctz and start back on amlodipine. He had some swelling with amlodipine in the past, so will just start back on a low dose of 2.5mg  once a day, #30, no refills. Schedule follow up in 3-4 weeks to check on BP and sodium level.

## 2020-09-22 ENCOUNTER — Encounter: Payer: Self-pay | Admitting: Family Medicine

## 2020-09-22 NOTE — Telephone Encounter (Signed)
Encounter created in error

## 2020-09-27 ENCOUNTER — Other Ambulatory Visit: Payer: Self-pay | Admitting: Family Medicine

## 2020-09-27 DIAGNOSIS — G629 Polyneuropathy, unspecified: Secondary | ICD-10-CM

## 2020-09-27 NOTE — Telephone Encounter (Signed)
Requested Prescriptions  Pending Prescriptions Disp Refills  . gabapentin (NEURONTIN) 800 MG tablet [Pharmacy Med Name: GABAPENTIN 800 MG TAB] 270 tablet 0    Sig: TAKE 1 TABLET BY MOUTH 3 TIMES DAILY AS NEEDED AS DIRECTED     Neurology: Anticonvulsants - gabapentin Passed - 09/27/2020  2:39 PM      Passed - Valid encounter within last 12 months    Recent Outpatient Visits          3 weeks ago Cerebrovascular disease   [facility] Birdie Sons, MD   10 months ago Essential hypertension   [facility] Birdie Sons, MD   11 months ago Essential hypertension   [facility] Birdie Sons, MD   12 months ago Migraine without aura and without status migrainosus, not intractable   [facility] Trinna Post, [place]   1 year ago Essential hypertension   [facility] Birdie Sons, MD      Future Appointments            In 2 weeks Fisher, Kirstie Peri, MD [facility], [place]

## 2020-09-29 ENCOUNTER — Other Ambulatory Visit: Payer: Self-pay

## 2020-09-29 ENCOUNTER — Encounter (INDEPENDENT_AMBULATORY_CARE_PROVIDER_SITE_OTHER): Payer: Self-pay | Admitting: Vascular Surgery

## 2020-09-29 ENCOUNTER — Ambulatory Visit (INDEPENDENT_AMBULATORY_CARE_PROVIDER_SITE_OTHER): Payer: BC Managed Care – PPO | Admitting: Vascular Surgery

## 2020-09-29 VITALS — BP 149/77 | HR 99 | Ht 66.0 in | Wt 143.0 lb

## 2020-09-29 DIAGNOSIS — I639 Cerebral infarction, unspecified: Secondary | ICD-10-CM | POA: Diagnosis not present

## 2020-09-29 DIAGNOSIS — I6523 Occlusion and stenosis of bilateral carotid arteries: Secondary | ICD-10-CM | POA: Diagnosis not present

## 2020-09-29 DIAGNOSIS — E785 Hyperlipidemia, unspecified: Secondary | ICD-10-CM

## 2020-09-29 DIAGNOSIS — I10 Essential (primary) hypertension: Secondary | ICD-10-CM | POA: Diagnosis not present

## 2020-09-29 DIAGNOSIS — I6529 Occlusion and stenosis of unspecified carotid artery: Secondary | ICD-10-CM | POA: Insufficient documentation

## 2020-09-29 IMAGING — CT CT CHEST W/ CM
2 of 5 series · 13 of 36 positions shown, 16 images · IV contrast (omnipaque)
Comparison: Chest CT 05/26/2008. PET-CT 06/04/2008. MRI of the
lumbar spine 12/04/2018.

CLINICAL DATA: 63-year-old male with history of lymphadenopathy.
Follow-up study.

EXAM:
CT CHEST, ABDOMEN, AND PELVIS WITH CONTRAST
TECHNIQUE: Multidetector CT imaging of the chest, abdomen and pelvis was
performed following the standard protocol during bolus
administration of intravenous contrast.
CONTRAST:  100mL OMNIPAQUE IOHEXOL 300 MG/ML  SOLN

[Series 2: axials cap 5.00 · axial · 0.90mm/px · z∈[-1431,-961]mm · 10 of 116 slices shown, 13 images]
[im 11/116  mediastinal]
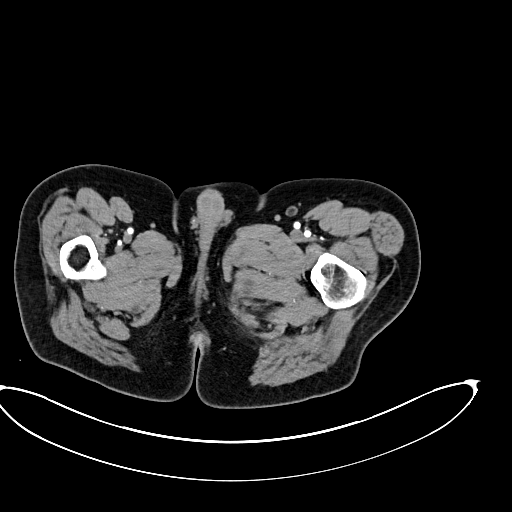
[im 11/116  lung]
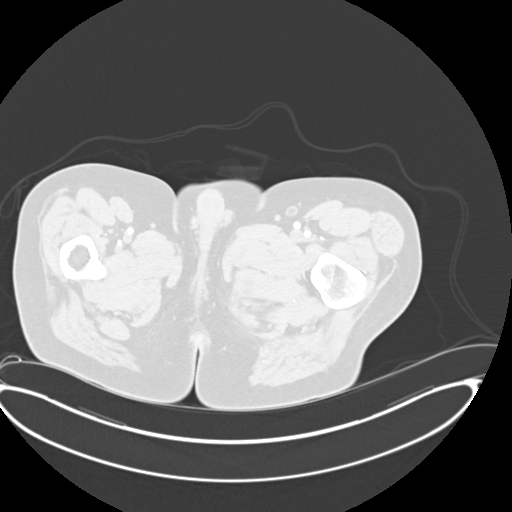
[im 21/116  lung]
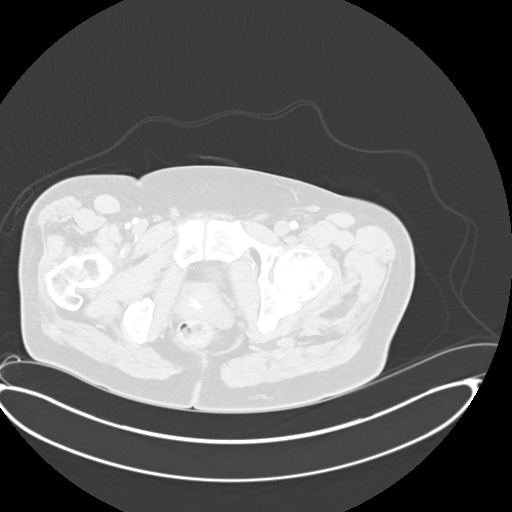
[im 32/116  lung]
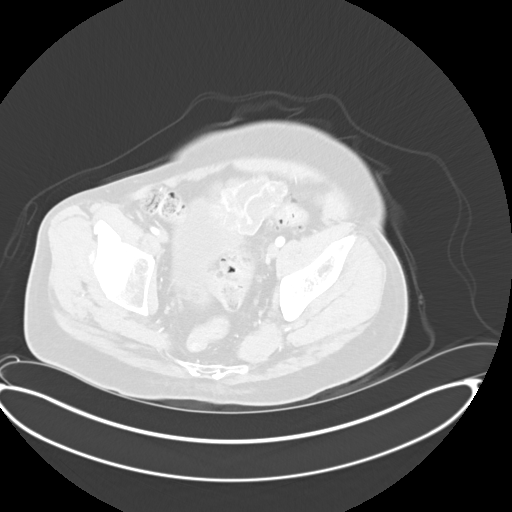
[im 42/116  lung]
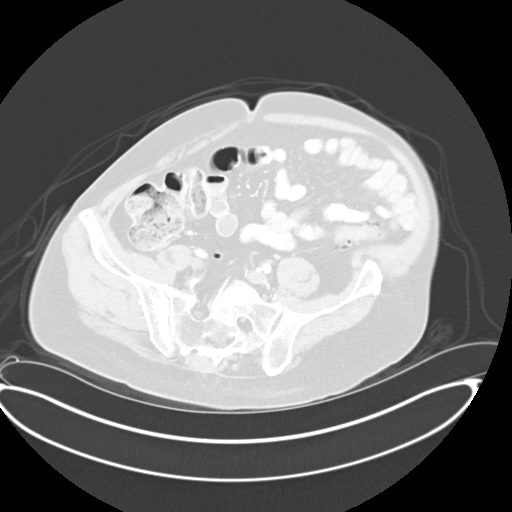
[im 53/116  mediastinal]
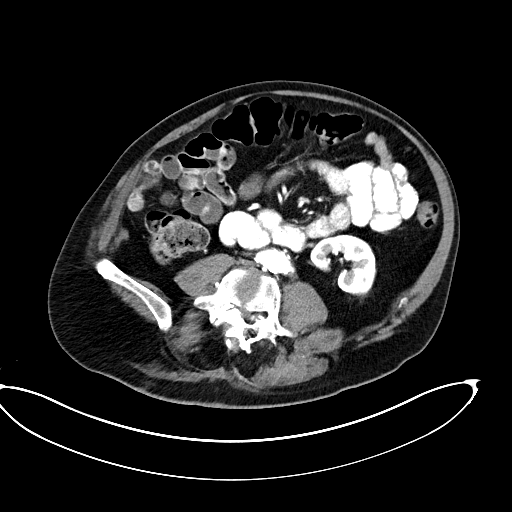
[im 53/116  lung]
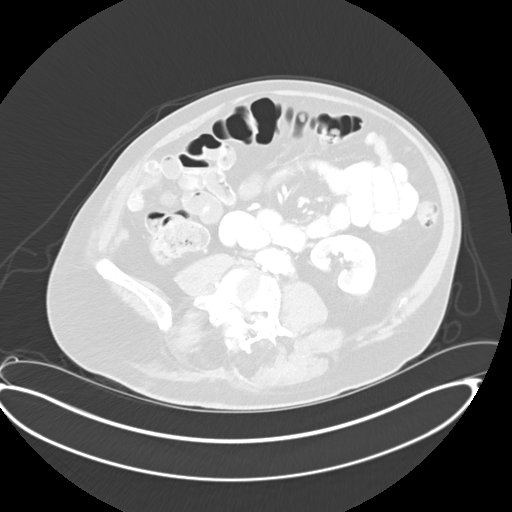
[im 63/116  lung]
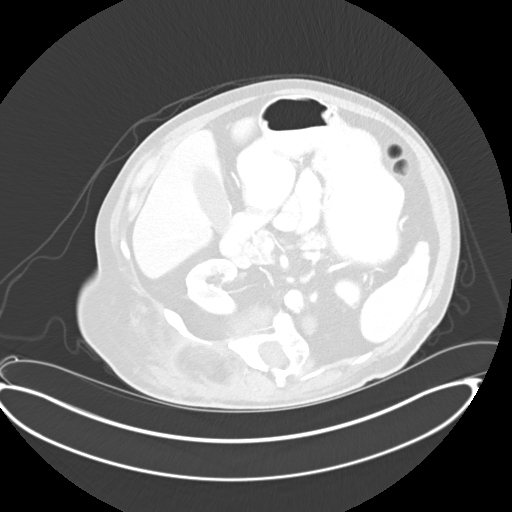
[im 74/116  lung]
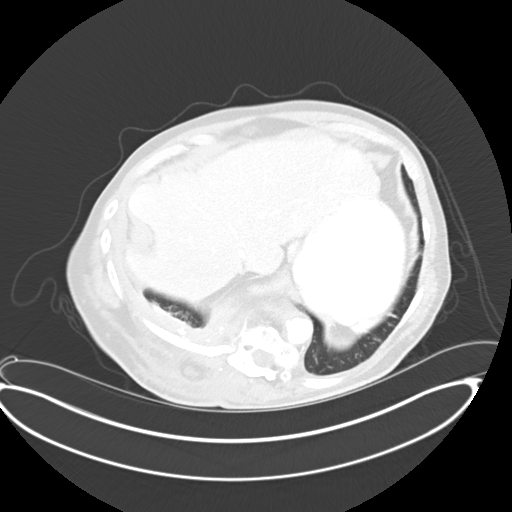
[im 84/116  lung]
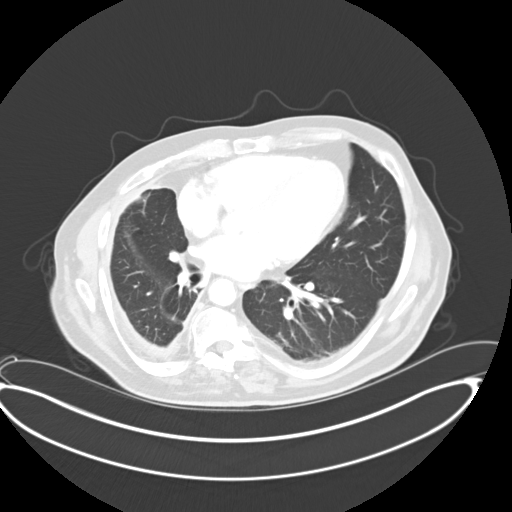
[im 95/116  mediastinal]
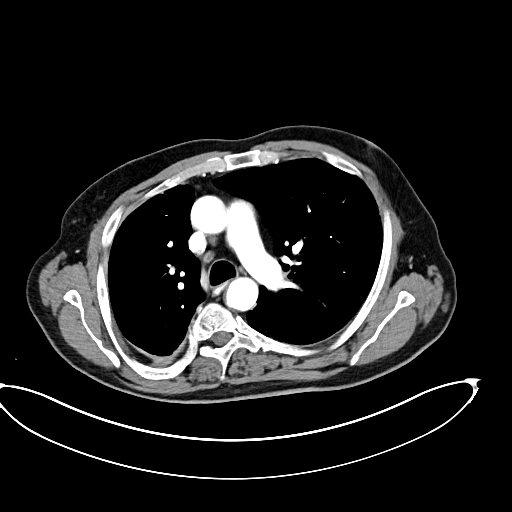
[im 95/116  lung]
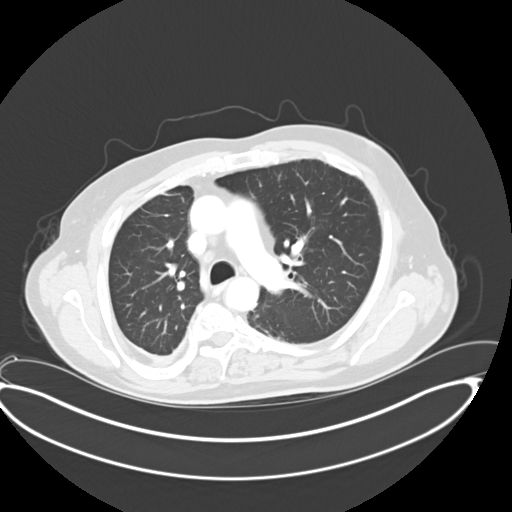
[im 105/116  lung]
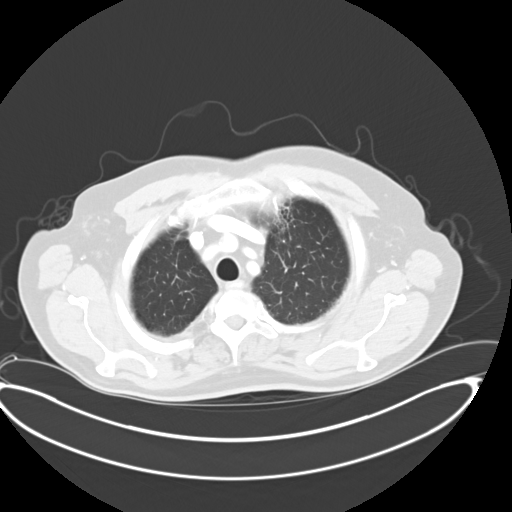

[Series 4: coronals cap 2.00 cor · coronal · 0.82mm/px · 3 of 151 slices shown]
[im 31/151  lung]
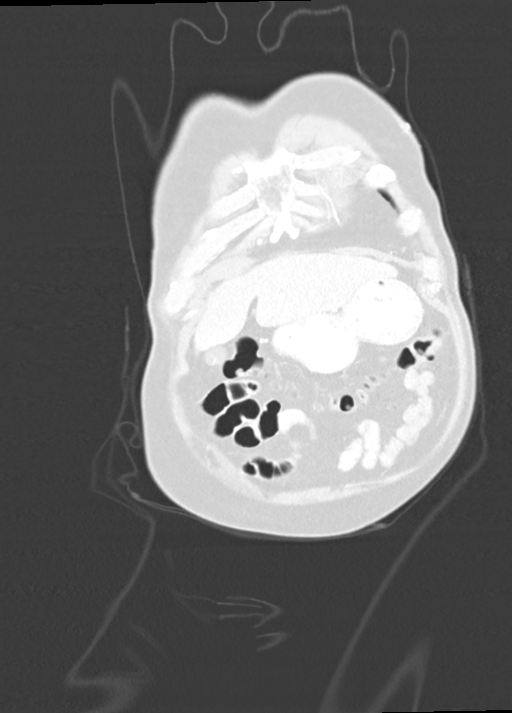
[im 61/151  lung]
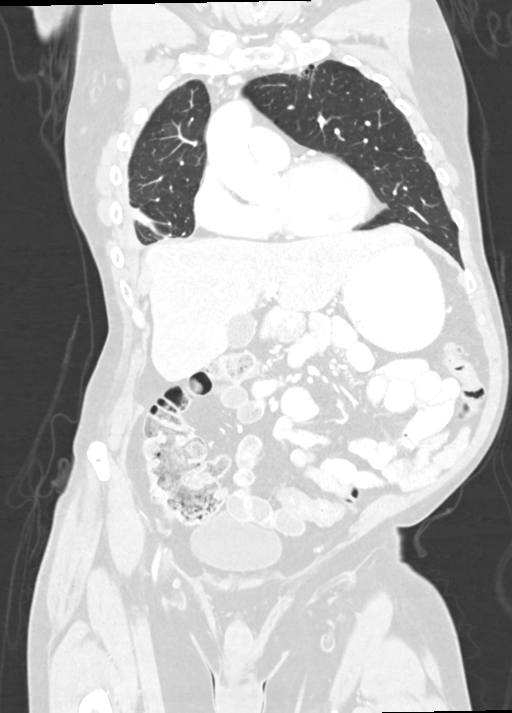
[im 91/151  lung]
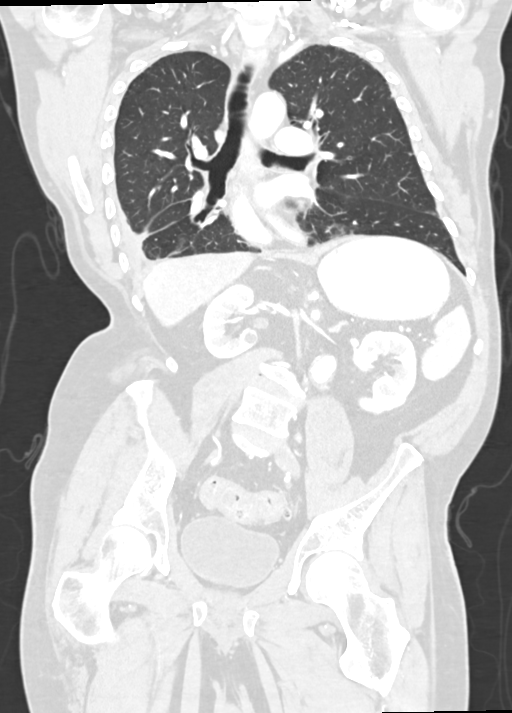

[13 of 36 positions shown; findings below may reference images not displayed]

FINDINGS: CT CHEST FINDINGS

Cardiovascular: Heart size is normal. There is no significant
pericardial fluid, thickening or pericardial calcification. Aortic
atherosclerosis. No definite coronary artery calcifications.

Mediastinum/Nodes: No pathologically enlarged mediastinal or hilar
lymph nodes. Esophagus is unremarkable in appearance. No axillary
lymphadenopathy.

Lungs/Pleura: Areas of pleural thickening, calcification and trace
volume of pleural fluid in the right hemithorax. This is associated
with areas of pleuroparenchymal thickening and architectural
distortion, the appearance of which suggests developing rounded
atelectasis both in the right middle and lower lobes. Scattered
areas of mild scarring are noted in the left lung as well. Mild
paraseptal emphysema. No definite suspicious appearing pulmonary
nodules or masses are noted.

Musculoskeletal: Severe S shaped scoliosis of the thoracolumbar
spine convex to the right in the thoracic region and to the left in
the lumbar region. There are no aggressive appearing lytic or
blastic lesions noted in the visualized portions of the skeleton.

CT ABDOMEN PELVIS FINDINGS

Hepatobiliary: No suspicious cystic or solid hepatic lesions. No
intra or extrahepatic biliary ductal dilatation. Gallbladder is
normal in appearance.

Pancreas: No pancreatic mass. No pancreatic ductal dilatation. No
pancreatic or peripancreatic fluid collections or inflammatory
changes.

Spleen: Unremarkable.

Adrenals/Urinary Tract: Exophytic lesion in the upper pole the right
kidney (axial image 51 of series 2) measuring 1.9 cm is intermediate
to high attenuation (61 HU), incompletely characterized without
noncontrast imaging. Left kidney and bilateral adrenal glands are
normal in appearance. No hydroureteronephrosis. Urinary bladder is
normal in appearance.

Stomach/Bowel: Normal appearance of the stomach. No pathologic
dilatation of small bowel or colon. Numerous colonic diverticulae
are noted, particularly in the sigmoid colon, without surrounding
inflammatory changes to suggest an acute diverticulitis at this
time. Normal appendix.

Vascular/Lymphatic: Aortic atherosclerosis, without evidence of
aneurysm or dissection in the abdominal or pelvic vasculature. Left
para-aortic lymph node measuring 1.5 cm in short axis (axial image
56 of series 2), decreased in size compared to the recent MRI of the
lumbar spine, and decreased in size compared to remote prior CT
05/26/2008 at which point this measured 1.9 cm in short axis,
presumably benign. No other lymphadenopathy noted elsewhere in the
abdomen or pelvis.

Reproductive: Prostate gland and seminal vesicles are unremarkable
in appearance.

Other: No significant volume of ascites.  No pneumoperitoneum.

Musculoskeletal: There are no aggressive appearing lytic or blastic
lesions noted in the visualized portions of the skeleton.
IMPRESSION: 1. The left para-aortic lymph node of concern on the recent lumbar
spine MRI has been present dating back to at least 3434 and is
decreased in size compared to both the recent prior MRI examination
as well as a remote prior CT from 3434, presumably a benign finding.
Exophytic 1.9 cm lesion in the upper pole the right kidney is
indeterminate on today's study, but suspicious for potential
neoplasm. Follow-up nonemergent abdominal MRI with and without IV
gadolinium is recommended in the near future to better characterize
this finding.
2. Chronic right-sided pleuroparenchymal scarring with areas of
probable developing rounded atelectasis, likely sequela of remote
right-sided infection.
3. Mild paraseptal emphysema.
4. Aortic atherosclerosis.
5. Colonic diverticulosis without evidence of acute diverticulitis
at this time.
6. Additional incidental findings, as above

## 2020-09-29 MED ORDER — CLOPIDOGREL BISULFATE 75 MG PO TABS: 75.0000 mg | ORAL_TABLET | Freq: Every day | ORAL | 11 refills | Status: DC

## 2020-09-29 NOTE — Assessment & Plan Note (Signed)
blood pressure control important in reducing the progression of atherosclerotic disease. On appropriate oral medications.  

## 2020-09-29 NOTE — Assessment & Plan Note (Signed)
He had a work-up which included a CT angiogram of the head and neck which I have independently reviewed.  This demonstrates significant extracranial cerebrovascular disease.  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  The right vertebral artery appears to have some narrowing at the origin of a small to medium size vessel.  The left carotid artery is patent with minimal disease present.   I had a long discussion with the patient today and tried to draw out a schematic to describe his cerebrovascular flow.  It is somewhat of a difficult situation and that the right carotid occlusion would not be amenable to any open or endovascular therapy.  The left carotid artery has no significant disease requiring treatment.  The questionable lesion would be his right vertebral artery stenosis.  The vast majority of vertebral artery lesions are managed medically as the vessel is not really large enough to accommodate endovascular therapy in most instances.  This is a decent size vessel on the CT scan and I told him to be happy to perform an angiogram for evaluation of treatment of the right vertebral artery, that would carry with it a small to medium risk of stroke particular given his concomitant carotid artery disease and contralateral vertebral artery disease.  I would recommend adding Plavix to his medical therapy to make dual antiplatelet therapy as well as a statin agent.  He is not interested in having an angiogram at this time that is reasonable.  I will have a short-term follow-up about 3 months with a duplex in our office

## 2020-09-29 NOTE — Assessment & Plan Note (Signed)
lipid control important in reducing the progression of atherosclerotic disease. Continue statin therapy  

## 2020-09-29 NOTE — Progress Notes (Signed)
Patient ID: Lucas Ramirez, male   DOB: 03/30/1956, 65 y.o.   MRN: [id number]  Chief Complaint  Patient presents with  . New Patient (Initial Visit)    New Cerebrovascular disease referred by Harlan Stains    HPI Lucas Ramirez is a 65 y.o. male.  I am asked to see the patient by Dr. Caryn Section for evaluation of significant extracranial cerebrovascular disease.  The patient's primary complaint is of leg pain which is felt to be largely neurogenic because of significant spine issues.  He walks with a cane and does so slowly.  About a month ago, he had an episode where he lost vision in the right eye and it did not return.  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  He had a work-up which included a CT angiogram of the head and neck which I have independently reviewed.  This demonstrates significant extracranial cerebrovascular disease.  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  The right vertebral artery appears to have some narrowing at the origin of a small to medium size vessel.  The left carotid artery is patent with minimal disease present.     Past Medical History:  Diagnosis Date  . Bell's palsy   . CAP (community acquired pneumonia) 06/28/2015  . COPD (chronic obstructive pulmonary disease) ([place])   . Depression   . GERD (gastroesophageal reflux disease)   . Glaucoma   . Hyperlipidemia   . Hypertension   . Migraine   . Scoliosis   . Shingles 2019   dx by dermatologist by patient report    Past Surgical History:  Procedure Laterality Date  . [place]   correcting Scoliosils per pt  . COLONOSCOPY WITH PROPOFOL N/A 07/04/2019   Procedure: COLONOSCOPY WITH PROPOFOL;  Surgeon: Lin Landsman, MD;  Location: [facility] ENDOSCOPY;  Service: Gastroenterology;  Laterality: N/A;  PRIORITY 4  . LUMBAR LAMINECTOMY/ DECOMPRESSION WITH MET-RX Right 10/30/2019   Procedure: L4-5 DECOMPRESSION;   Surgeon: Meade Maw, MD;  Location: [facility] ORS;  Service: Neurosurgery;  Laterality: Right;  . SKIN LESION EXCISION  06/06/2013   facial left cheek. Dr. Evorn Gong  . UPPER GI ENDOSCOPY  07/11/2008   [facility]. Dr. Donnella Sham. Impression. -LA Grade C reflux esophagitis. 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     Family History  Problem Relation Age of Onset  . Emphysema Mother   . Down syndrome Sister   . CAD Neg Hx   . Heart disease Neg Hx   . Colon cancer Neg Hx   . Prostate cancer Neg Hx   no bleeding or clotting disorders   Social History   Tobacco Use  . Smoking status: Current Every Day Smoker    Packs/day: 0.10    Years: 35.00    Pack years: 3.50    Types: Cigarettes  . Smokeless tobacco: Never Used  Vaping Use  . Vaping Use: Some days  Substance Use Topics  . Alcohol use: No  . Drug use: No    Allergies  Allergen Reactions  . Bupropion Hcl Other (See Comments)    seizures    Current Outpatient Medications  Medication Sig Dispense Refill  . acetaminophen (TYLENOL) 500 MG tablet Take 1 tablet (500 mg total) by mouth every 6 (six) hours as needed for mild pain. 30 tablet 0  . amLODipine (NORVASC) 2.5 MG tablet Take 1 tablet (2.5 mg  total) by mouth daily. 30 tablet 0  . aspirin EC 81 MG EC tablet Take 1 tablet (81 mg total) by mouth daily. Swallow whole.    . buprenorphine (SUBUTEX) 8 MG SUBL SL tablet Place 4 mg under the tongue 2 (two) times daily.    . calcium carbonate (OSCAL) 1500 (600 Ca) MG TABS tablet Take 600 mg of elemental calcium by mouth 2 (two) times daily with a meal.    . cholecalciferol (VITAMIN D) 1000 UNITS tablet Take 1,000 Units by mouth daily.    . clopidogrel (PLAVIX) 75 MG tablet Take 1 tablet (75 mg total) by mouth daily. 30 tablet 11  . denosumab (PROLIA) 60 MG/ML SOSY injection Inject 60 mg into the skin every 6 (six) months.    . DULoxetine (CYMBALTA) 20 MG capsule Take 20 mg by mouth 2 (two) times daily. Morning  & afternoon    . gabapentin (NEURONTIN) 800 MG tablet TAKE 1 TABLET BY MOUTH 3 TIMES DAILY AS NEEDED AS DIRECTED 270 tablet 0  . hydrochlorothiazide (HYDRODIURIL) 25 MG tablet TAKE 1 TABLET BY MOUTH ONCE DAILY 90 tablet 1  . latanoprost (XALATAN) 0.005 % ophthalmic solution Place 1-2 drops into both eyes See admin instructions. Instill 1 drop into left eye at bedtime Instill 2 drops into right eye at bedtime    . lisinopril (ZESTRIL) 20 MG tablet TAKE 1 TABLET BY MOUTH ONCE DAILY 90 tablet 1  . omeprazole (PRILOSEC) 20 MG capsule TAKE 1 CAPSULE BY MOUTH ONCE DAILY 30 capsule 11  . rosuvastatin (CRESTOR) 20 MG tablet Take 1 tablet (20 mg total) by mouth daily. 90 tablet 3  . SF 5000 PLUS 1.1 % CREA dental cream Place 1 application onto teeth in the morning and at bedtime.    . SUMAtriptan (IMITREX) 100 MG tablet TAKE 1 TABLET BY MOUTH AT ONSET OF HEADACHE MAY TAKE AN ADDITIONAL TABLET IN 2 HOURS IF NEEDED MAX OF 200MG/24HR (Patient taking differently: Take 100 mg by mouth every 2 (two) hours as needed (migraines. (max 2 doses/24 hrs)).) 9 tablet 6  . varenicline (CHANTIX STARTING MONTH PAK) 0.5 MG X 11 & 1 MG X 42 tablet Take one 0.5 mg tab by mouth once daily for 3 days, then take one 0.5 mg tab twice daily for 4 days, then take one 1 mg tab twice daily. 53 tablet 0  . varenicline (CHANTIX) 1 MG tablet Take by mouth.     No current facility-administered medications for this visit.      REVIEW OF SYSTEMS (Negative unless checked)  Constitutional: '[]' Weight loss  '[]' Fever  '[]' Chills Cardiac: '[]' Chest pain   '[]' Chest pressure   '[]' Palpitations   '[]' Shortness of breath when laying flat   '[]' Shortness of breath at rest   '[]' Shortness of breath with exertion. Vascular:  '[]' Pain in legs with walking   '[]' Pain in legs at rest   '[]' Pain in legs when laying flat   '[]' Claudication   '[]' Pain in feet when walking  '[]' Pain in feet at rest  '[]' Pain in feet when laying flat   '[]' History of DVT   '[]' Phlebitis   '[]' Swelling in  legs   '[]' Varicose veins   '[]' Non-healing ulcers Pulmonary:   '[]' Uses home oxygen   '[]' Productive cough   '[]' Hemoptysis   '[]' Wheeze  '[x]' COPD   '[]' Asthma Neurologic:  '[]' Dizziness  '[]' Blackouts   '[]' Seizures   '[x]' History of stroke   '[]' History of TIA  '[]' Aphasia   '[x]' Temporary blindness   '[]' Dysphagia   '[]' Weakness or numbness in arms   '[]'   Weakness or numbness in legs Musculoskeletal:  '[x]' Arthritis   '[]' Joint swelling   '[]' Joint pain   '[x]' Low back pain Hematologic:  '[]' Easy bruising  '[]' Easy bleeding   '[]' Hypercoagulable state   '[]' Anemic  '[]' Hepatitis Gastrointestinal:  '[]' Blood in stool   '[]' Vomiting blood  '[x]' Gastroesophageal reflux/heartburn   '[]' Abdominal pain Genitourinary:  '[]' Chronic kidney disease   '[]' Difficult urination  '[]' Frequent urination  '[]' Burning with urination   '[]' Hematuria Skin:  '[]' Rashes   '[]' Ulcers   '[]' Wounds Psychological:  '[]' History of anxiety   '[]'  History of major depression.    Physical Exam BP (!) 149/77   Pulse 99   Ht '5\' 6"'  (1.676 m)   Wt 143 lb (64.9 kg)   BMI 23.08 kg/m  Gen:  WD/WN, NAD Head: [place]/AT, No temporalis wasting.  Ear/Nose/Throat: Hearing grossly intact, nares w/o erythema or drainage, oropharynx w/o Erythema/Exudate Eyes: Conjunctiva clear, sclera non-icteric  Neck: trachea midline.  No JVD.  Bilateral carotid bruits present Pulmonary:  Good air movement, respirations not labored, no use of accessory muscles  Cardiac: RRR, no JVD Vascular:  Vessel Right Left  Radial Palpable Palpable                                   Gastrointestinal:. No masses, surgical incisions, or scars. Musculoskeletal: M/S 5/5 throughout.  Extremities without ischemic changes.  No deformity or atrophy.  Walks with a cane.  1+ bilateral lower extremity edema. Neurologic: Sensation grossly intact in extremities.  Symmetrical.  Speech is fluent. Motor exam as listed above. Psychiatric: Judgment and insight appear decent Dermatologic: No rashes or ulcers noted.  No cellulitis or open  wounds.    Radiology No results found.  Labs Recent Results (from the past 2160 hour(s))  Protime-INR     Status: None   Collection Time: 08/19/20 11:25 AM  Result Value Ref Range   Prothrombin Time 12.9 11.4 - 15.2 seconds   INR 1.0 0.8 - 1.2    Comment: (NOTE) INR goal varies based on device and disease states. Performed at [facility], [address] [place], [place] [address]   APTT     Status: None   Collection Time: 08/19/20 11:25 AM  Result Value Ref Range   aPTT 30 24 - 36 seconds    Comment: Performed at [facility], [address] [place], [place] [address]  CBC     Status: Abnormal   Collection Time: 08/19/20 11:25 AM  Result Value Ref Range   WBC 9.7 4.0 - 10.5 K/uL   RBC 4.55 4.22 - 5.81 MIL/uL   Hemoglobin 13.3 13.0 - 17.0 g/dL   HCT 39.2 39.0 - 52.0 %   MCV 86.2 80.0 - 100.0 fL   MCH 29.2 26.0 - 34.0 pg   MCHC 33.9 30.0 - 36.0 g/dL   RDW 13.8 11.5 - 15.5 %   Platelets 403 (H) 150 - 400 K/uL   nRBC 0.0 0.0 - 0.2 %    Comment: Performed at [facility], [address] [place], [place] [address]  Differential     Status: None   Collection Time: 08/19/20 11:25 AM  Result Value Ref Range   Neutrophils Relative % 78 %   Neutro Abs 7.4 1.7 - 7.7 K/uL   Lymphocytes Relative 12 %   Lymphs Abs 1.2 0.7 - 4.0 K/uL   Monocytes Relative 9 %   Monocytes Absolute 0.9 0.1 - 1.0 K/uL  Eosinophils Relative 1 %   Eosinophils Absolute 0.1 0.0 - 0.5 K/uL   Basophils Relative 0 %   Basophils Absolute 0.0 0.0 - 0.1 K/uL   Immature Granulocytes 0 %   Abs Immature Granulocytes 0.02 0.00 - 0.07 K/uL    Comment: Performed at [facility], [address] [place], [place] [address]  Comprehensive metabolic panel     Status: Abnormal   Collection Time: 08/19/20 11:25 AM  Result Value Ref Range   Sodium 130 (L) 135 - 145 mmol/L   Potassium 4.9 3.5 - 5.1 mmol/L   Chloride 92 (L) 98 - 111 mmol/L   CO2 27 22 - 32 mmol/L    Glucose, Bld 108 (H) 70 - 99 mg/dL    Comment: Glucose reference range applies only to samples taken after fasting for at least 8 hours.   BUN 27 (H) 8 - 23 mg/dL   Creatinine, Ser 1.52 (H) 0.61 - 1.24 mg/dL   Calcium 8.6 (L) 8.9 - 10.3 mg/dL   Total Protein 7.6 6.5 - 8.1 g/dL   Albumin 4.3 3.5 - 5.0 g/dL   AST 15 15 - 41 U/L   ALT 11 0 - 44 U/L   Alkaline Phosphatase 75 38 - 126 U/L   Total Bilirubin 0.5 0.3 - 1.2 mg/dL   GFR, Estimated 51 (L) >60 mL/min    Comment: (NOTE) Calculated using the CKD-EPI Creatinine Equation (2021)    Anion gap 11 5 - 15    Comment: Performed at [facility], [address], [place], [place] [address]  Resp Panel by RT-PCR (Flu A&B, Covid) Nasopharyngeal Swab     Status: None   Collection Time: 08/19/20 12:16 PM   Specimen: Nasopharyngeal Swab; Nasopharyngeal(NP) swabs in vial transport medium  Result Value Ref Range   SARS Coronavirus 2 by RT PCR NEGATIVE NEGATIVE    Comment: (NOTE) SARS-CoV-2 target nucleic acids are NOT DETECTED.  The SARS-CoV-2 RNA is generally detectable in upper respiratory specimens during the acute phase of infection. The lowest concentration of SARS-CoV-2 viral copies this assay can detect is 138 copies/mL. A negative result does not preclude SARS-Cov-2 infection and should not be used as the sole basis for treatment or other patient management decisions. A negative result may occur with  improper specimen collection/handling, submission of specimen other than nasopharyngeal swab, presence of viral mutation(s) within the areas targeted by this assay, and inadequate number of viral copies(<138 copies/mL). A negative result must be combined with clinical observations, patient history, and epidemiological information. The expected result is Negative.  Fact Sheet for Patients:  EntrepreneurPulse.com.au  Fact Sheet for Healthcare Providers:  IncredibleEmployment.be  This test  is no t yet approved or cleared by the [place] FDA and  has been authorized for detection and/or diagnosis of SARS-CoV-2 by FDA under an Emergency Use Authorization (EUA). This EUA will remain  in effect (meaning this test can be used) for the duration of the COVID-19 declaration under Section 564(b)(1) of the Act, 21 U.S.C.section 360bbb-3(b)(1), unless the authorization is terminated  or revoked sooner.       Influenza A by PCR NEGATIVE NEGATIVE   Influenza B by PCR NEGATIVE NEGATIVE    Comment: (NOTE) The Xpert Xpress SARS-CoV-2/FLU/RSV plus assay is intended as an aid in the diagnosis of influenza from Nasopharyngeal swab specimens and should not be used as a sole basis for treatment. Nasal washings and aspirates are unacceptable for Xpert Xpress SARS-CoV-2/FLU/RSV testing.  Fact Sheet for Patients:  EntrepreneurPulse.com.au  Fact Sheet for Healthcare Providers: IncredibleEmployment.be  This test is not yet approved or cleared by the [place] FDA and has been authorized for detection and/or diagnosis of SARS-CoV-2 by FDA under an Emergency Use Authorization (EUA). This EUA will remain in effect (meaning this test can be used) for the duration of the COVID-19 declaration under Section 564(b)(1) of the Act, 21 U.S.C. section 360bbb-3(b)(1), unless the authorization is terminated or revoked.  Performed at [facility], [address] [place], [place] [address]   Ethanol     Status: None   Collection Time: 08/19/20  4:08 PM  Result Value Ref Range   Alcohol, Ethyl (B) <10 <10 mg/dL    Comment: (NOTE) Lowest detectable limit for serum alcohol is 10 mg/dL.  For medical purposes only. Performed at [facility], [address] [place], [place] [address]   ECHOCARDIOGRAM COMPLETE     Status: None   Collection Time: 08/19/20  6:28 PM  Result Value Ref Range   Weight 2,320 oz   Height 66 in   BP 158/92  mmHg   S' Lateral 2.53 cm   Area-P 1/2 3.21 cm2  Hemoglobin A1c     Status: Abnormal   Collection Time: 08/20/20  3:23 AM  Result Value Ref Range   Hgb A1c MFr Bld 5.9 (H) 4.8 - 5.6 %    Comment: (NOTE) Pre diabetes:          5.7%-6.4%  Diabetes:              >6.4%  Glycemic control for   <7.0% adults with diabetes    Mean Plasma Glucose 122.63 mg/dL    Comment: Performed at [facility] [address], [place], [place] [address]  Lipid panel     Status: Abnormal   Collection Time: 08/20/20  3:23 AM  Result Value Ref Range   Cholesterol 105 0 - 200 mg/dL   Triglycerides 121 <150 mg/dL   HDL 23 (L) >40 mg/dL   Total CHOL/HDL Ratio 4.6 RATIO   VLDL 24 0 - 40 mg/dL   LDL Cholesterol 58 0 - 99 mg/dL    Comment:        Total Cholesterol/HDL:CHD Risk Coronary Heart Disease Risk Table                     Men   Women  1/2 Average Risk   3.4   3.3  Average Risk       5.0   4.4  2 X Average Risk   9.6   7.1  3 X Average Risk  23.4   11.0        Use the calculated Patient Ratio above and the CHD Risk Table to determine the patient's CHD Risk.        ATP III CLASSIFICATION (LDL):  <100     mg/dL   Optimal  100-129  mg/dL   Near or Above                    Optimal  130-159  mg/dL   Borderline  160-189  mg/dL   High  >190     mg/dL   Very High Performed at [facility], [address] [place], [place] [address]   CBC     Status: Abnormal   Collection Time: 08/20/20  3:23 AM  Result Value Ref Range   WBC 6.3 4.0 - 10.5 K/uL  RBC 3.82 (L) 4.22 - 5.81 MIL/uL   Hemoglobin 11.0 (L) 13.0 - 17.0 g/dL   HCT 33.1 (L) 39.0 - 52.0 %   MCV 86.6 80.0 - 100.0 fL   MCH 28.8 26.0 - 34.0 pg   MCHC 33.2 30.0 - 36.0 g/dL   RDW 13.5 11.5 - 15.5 %   Platelets 330 150 - 400 K/uL   nRBC 0.0 0.0 - 0.2 %    Comment: Performed at [facility], [address], [place], [place] [address]  Basic metabolic panel     Status: Abnormal   Collection Time: 08/20/20  3:23 AM   Result Value Ref Range   Sodium 132 (L) 135 - 145 mmol/L   Potassium 4.8 3.5 - 5.1 mmol/L   Chloride 96 (L) 98 - 111 mmol/L   CO2 29 22 - 32 mmol/L   Glucose, Bld 103 (H) 70 - 99 mg/dL    Comment: Glucose reference range applies only to samples taken after fasting for at least 8 hours.   BUN 23 8 - 23 mg/dL   Creatinine, Ser 1.20 0.61 - 1.24 mg/dL   Calcium 7.6 (L) 8.9 - 10.3 mg/dL   GFR, Estimated >60 >60 mL/min    Comment: (NOTE) Calculated using the CKD-EPI Creatinine Equation (2021)    Anion gap 7 5 - 15    Comment: Performed at [facility], [address] [place], [place] [address]  Basic metabolic panel     Status: Abnormal   Collection Time: 08/21/20  5:19 AM  Result Value Ref Range   Sodium 130 (L) 135 - 145 mmol/L   Potassium 4.3 3.5 - 5.1 mmol/L   Chloride 96 (L) 98 - 111 mmol/L   CO2 26 22 - 32 mmol/L   Glucose, Bld 104 (H) 70 - 99 mg/dL    Comment: Glucose reference range applies only to samples taken after fasting for at least 8 hours.   BUN 21 8 - 23 mg/dL   Creatinine, Ser 1.24 0.61 - 1.24 mg/dL   Calcium 7.6 (L) 8.9 - 10.3 mg/dL   GFR, Estimated >60 >60 mL/min    Comment: (NOTE) Calculated using the CKD-EPI Creatinine Equation (2021)    Anion gap 8 5 - 15    Comment: Performed at [facility], [address] [place], [place] [address]  CBC     Status: Abnormal   Collection Time: 08/21/20  5:19 AM  Result Value Ref Range   WBC 5.7 4.0 - 10.5 K/uL   RBC 3.89 (L) 4.22 - 5.81 MIL/uL   Hemoglobin 11.5 (L) 13.0 - 17.0 g/dL   HCT 33.2 (L) 39.0 - 52.0 %   MCV 85.3 80.0 - 100.0 fL   MCH 29.6 26.0 - 34.0 pg   MCHC 34.6 30.0 - 36.0 g/dL   RDW 13.3 11.5 - 15.5 %   Platelets 318 150 - 400 K/uL   nRBC 0.0 0.0 - 0.2 %    Comment: Performed at [facility], [address], [place], [place] [address]  Magnesium     Status: None   Collection Time: 08/21/20  5:19 AM  Result Value Ref Range   Magnesium 2.0 1.7 - 2.4 mg/dL    Comment:  Performed at [facility], [address] [place], [place] [address]  CBC     Status: Abnormal   Collection Time: 08/31/20  4:26 PM  Result Value Ref Range   WBC 7.5 3.4 - 10.8 x10E3/uL   RBC  4.23 4.14 - 5.80 x10E6/uL   Hemoglobin 12.3 (L) 13.0 - 17.7 g/dL   Hematocrit 36.4 (L) 37.5 - 51.0 %   MCV 86 79 - 97 fL   MCH 29.1 26.6 - 33.0 pg   MCHC 33.8 31.5 - 35.7 g/dL   RDW 13.7 11.6 - 15.4 %   Platelets 332 150 - 450 x10E3/uL  TSH     Status: None   Collection Time: 08/31/20  4:26 PM  Result Value Ref Range   TSH 1.560 0.450 - 4.500 uIU/mL  Comprehensive metabolic panel     Status: Abnormal   Collection Time: 08/31/20  4:26 PM  Result Value Ref Range   Glucose 94 65 - 99 mg/dL   BUN 17 8 - 27 mg/dL   Creatinine, Ser 1.45 (H) 0.76 - 1.27 mg/dL   eGFR 54 (L) >59 mL/min/1.73   BUN/Creatinine Ratio 12 10 - 24   Sodium 128 (L) 134 - 144 mmol/L   Potassium 4.9 3.5 - 5.2 mmol/L   Chloride 91 (L) 96 - 106 mmol/L   CO2 25 20 - 29 mmol/L   Calcium 8.0 (L) 8.6 - 10.2 mg/dL   Total Protein 6.5 6.0 - 8.5 g/dL   Albumin 4.4 3.8 - 4.8 g/dL   Globulin, Total 2.1 1.5 - 4.5 g/dL   Albumin/Globulin Ratio 2.1 1.2 - 2.2   Bilirubin Total <0.2 0.0 - 1.2 mg/dL   Alkaline Phosphatase 76 44 - 121 IU/L   AST 16 0 - 40 IU/L   ALT 11 0 - 44 IU/L  VITAMIN D 25 Hydroxy (Vit-D Deficiency, Fractures)     Status: None   Collection Time: 08/31/20  4:26 PM  Result Value Ref Range   Vit D, 25-Hydroxy 39.8 30.0 - 100.0 ng/mL    Comment: Vitamin D deficiency has been defined by the Institute of Medicine and an Endocrine Society practice guideline as a level of serum 25-OH vitamin D less than 20 ng/mL (1,2). The Endocrine Society went on to further define vitamin D insufficiency as a level between 21 and 29 ng/mL (2). 1. IOM (Institute of Medicine). 2010. Dietary reference    intakes for calcium and D. [place]: The    [employer]. 2. Holick MF, Binkley [place], Bischoff-Ferrari HA,  et al.    Evaluation, treatment, and prevention of vitamin D    deficiency: an Endocrine Society clinical practice    guideline. JCEM. 2011 Jul; 96(7):1911-30.   Magnesium     Status: None   Collection Time: 08/31/20  4:26 PM  Result Value Ref Range   Magnesium 2.1 1.6 - 2.3 mg/dL  Specimen status report     Status: None   Collection Time: 08/31/20  4:26 PM  Result Value Ref Range   specimen status report Comment     Comment: Written Authorization Written Authorization Written Authorization Received. Authorization received from [facility] 09-07-2020 Logged by Denita Lung     Assessment/Plan:  Stroke (cerebrum) [facility]) He had a work-up which included a CT angiogram of the head and neck which I have independently reviewed.  This demonstrates significant extracranial cerebrovascular disease.  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  The right vertebral artery appears to have some narrowing at the origin of a small to medium size vessel.  The left carotid artery is patent with minimal disease present.    HTN (hypertension) blood pressure control important in reducing the progression of atherosclerotic  disease. On appropriate oral medications.   HLD (hyperlipidemia) lipid control important in reducing the progression of atherosclerotic disease. Continue statin therapy   Carotid stenosis He had a work-up which included a CT angiogram of the head and neck which I have independently reviewed.  This demonstrates significant extracranial cerebrovascular disease.  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  The right vertebral artery appears to have some narrowing at the origin of a small to medium size vessel.  The left carotid artery is patent with minimal disease present.   I had a long discussion with the patient today and tried to draw out a schematic  to describe his cerebrovascular flow.  It is somewhat of a difficult situation and that the right carotid occlusion would not be amenable to any open or endovascular therapy.  The left carotid artery has no significant disease requiring treatment.  The questionable lesion would be his right vertebral artery stenosis.  The vast majority of vertebral artery lesions are managed medically as the vessel is not really large enough to accommodate endovascular therapy in most instances.  This is a decent size vessel on the CT scan and I told him to be happy to perform an angiogram for evaluation of treatment of the right vertebral artery, that would carry with it a small to medium risk of stroke particular given his concomitant carotid artery disease and contralateral vertebral artery disease.  I would recommend adding Plavix to his medical therapy to make dual antiplatelet therapy as well as a statin agent.  He is not interested in having an angiogram at this time that is reasonable.  I will have a short-term follow-up about 3 months with a duplex in our office      Leotis Pain 09/29/2020, 4:10 PM   This note was created with Dragon medical transcription system.  Any errors from dictation are unintentional.

## 2020-09-29 NOTE — Assessment & Plan Note (Signed)
He had a work-up which included a CT angiogram of the head and neck which I have independently reviewed.  This demonstrates significant extracranial cerebrovascular disease.  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  The right vertebral artery appears to have some narrowing at the origin of a small to medium size vessel.  The left carotid artery is patent with minimal disease present.

## 2020-10-12 ENCOUNTER — Encounter (INDEPENDENT_AMBULATORY_CARE_PROVIDER_SITE_OTHER): Payer: Self-pay

## 2020-10-13 ENCOUNTER — Ambulatory Visit: Payer: BC Managed Care – PPO | Admitting: Family Medicine

## 2020-10-13 ENCOUNTER — Other Ambulatory Visit: Payer: Self-pay

## 2020-10-13 ENCOUNTER — Encounter: Payer: Self-pay | Admitting: Family Medicine

## 2020-10-13 VITALS — BP 148/84 | HR 98 | Wt 138.0 lb

## 2020-10-13 DIAGNOSIS — E871 Hypo-osmolality and hyponatremia: Secondary | ICD-10-CM | POA: Diagnosis not present

## 2020-10-13 DIAGNOSIS — I10 Essential (primary) hypertension: Secondary | ICD-10-CM | POA: Diagnosis not present

## 2020-10-13 DIAGNOSIS — M549 Dorsalgia, unspecified: Secondary | ICD-10-CM

## 2020-10-13 DIAGNOSIS — R609 Edema, unspecified: Secondary | ICD-10-CM

## 2020-10-13 DIAGNOSIS — G8929 Other chronic pain: Secondary | ICD-10-CM

## 2020-10-13 MED ORDER — TRAMADOL HCL 50 MG PO TABS: 50.0000 mg | ORAL_TABLET | Freq: Three times a day (TID) | ORAL | 3 refills | Status: DC | PRN

## 2020-10-13 NOTE — Progress Notes (Signed)
Established patient visit   Patient: Lucas Ramirez   DOB: 07-Oct-1955   65 y.o. Male  MRN: [id number] Visit Date: 10/13/2020  Today's healthcare provider: Lelon Huh, MD   No chief complaint on file.  Subjective    HPI  Hypertension, follow-up  BP Readings from Last 3 Encounters:  10/13/20 (!) 148/84  09/29/20 (!) 149/77  08/31/20 134/60   Wt Readings from Last 3 Encounters:  10/13/20 138 lb (62.6 kg)  09/29/20 143 lb (64.9 kg)  08/31/20 147 lb 3.2 oz (66.8 kg)     He was last seen for hypertension 1 months ago.  BP at that visit was 134/60. Management since that visit includes stopping HCTZ and restarting amlodipine 2.5mg  due to low sodium levels.  He reports excellent compliance with treatment. He is having side effects. Pt states both of his feet are swelling.  He is following a Regular diet. He is not exercising. He does not smoke. Pt states he quit smoking again today.  Use of agents associated with hypertension: none.   Outside blood pressures are not being checked. Symptoms: No chest pain No chest pressure  No palpitations No syncope  No dyspnea No orthopnea  No paroxysmal nocturnal dyspnea No lower extremity edema    --------------------------------------------------------------------------------------------------- Lipid/Cholesterol, Follow-up  Last lipid panel Other pertinent labs  Lab Results  Component Value Date   CHOL 105 08/20/2020   HDL 23 (L) 08/20/2020   LDLCALC 58 08/20/2020   TRIG 121 08/20/2020   CHOLHDL 4.6 08/20/2020   Lab Results  Component Value Date   ALT 11 08/31/2020   AST 16 08/31/2020   PLT 332 08/31/2020   TSH 1.560 08/31/2020     He was last seen for this 1 months ago.  Management since that visit includes increasing rosuvastatin to 20mg  daily.  He reports excellent compliance with treatment. He is not having side effects.   Symptoms: No chest pain No chest pressure/discomfort  No dyspnea No lower extremity  edema  No numbness or tingling of extremity No orthopnea  No palpitations No paroxysmal nocturnal dyspnea  No speech difficulty No syncope   Current diet: in general, a "healthy" diet   Current exercise: none  The ASCVD Risk score ([place]., et al., 2013) failed to calculate for the following reasons:   The patient has a prior MI or stroke diagnosis  ---------------------------------------------------------------------------------------------------      Medications: Outpatient Medications Prior to Visit  Medication Sig   acetaminophen (TYLENOL) 500 MG tablet Take 1 tablet (500 mg total) by mouth every 6 (six) hours as needed for mild pain.   amLODipine (NORVASC) 2.5 MG tablet Take 1 tablet (2.5 mg total) by mouth daily.   aspirin EC 81 MG EC tablet Take 1 tablet (81 mg total) by mouth daily. Swallow whole.   buprenorphine (SUBUTEX) 8 MG SUBL SL tablet Place 4 mg under the tongue 2 (two) times daily.   calcium carbonate (OSCAL) 1500 (600 Ca) MG TABS tablet Take 600 mg of elemental calcium by mouth 2 (two) times daily with a meal.   cholecalciferol (VITAMIN D) 1000 UNITS tablet Take 1,000 Units by mouth daily.   clopidogrel (PLAVIX) 75 MG tablet Take 1 tablet (75 mg total) by mouth daily.   denosumab (PROLIA) 60 MG/ML SOSY injection Inject 60 mg into the skin every 6 (six) months.   DULoxetine (CYMBALTA) 20 MG capsule Take 20 mg by mouth 2 (two) times daily. Morning & afternoon  gabapentin (NEURONTIN) 800 MG tablet TAKE 1 TABLET BY MOUTH 3 TIMES DAILY AS NEEDED AS DIRECTED   latanoprost (XALATAN) 0.005 % ophthalmic solution Place 1-2 drops into both eyes See admin instructions. Instill 1 drop into left eye at bedtime Instill 2 drops into right eye at bedtime   lisinopril (ZESTRIL) 20 MG tablet TAKE 1 TABLET BY MOUTH ONCE DAILY   omeprazole (PRILOSEC) 20 MG capsule TAKE 1 CAPSULE BY MOUTH ONCE DAILY   rosuvastatin (CRESTOR) 20 MG tablet Take 1 tablet (20 mg total) by mouth daily.    SF 5000 PLUS 1.1 % CREA dental cream Place 1 application onto teeth in the morning and at bedtime.   hydrochlorothiazide (HYDRODIURIL) 25 MG tablet TAKE 1 TABLET BY MOUTH ONCE DAILY (Patient not taking: Reported on 10/13/2020)   varenicline (CHANTIX) 1 MG tablet Take by mouth.   [DISCONTINUED] SUMAtriptan (IMITREX) 100 MG tablet TAKE 1 TABLET BY MOUTH AT ONSET OF HEADACHE MAY TAKE AN ADDITIONAL TABLET IN 2 HOURS IF NEEDED MAX OF 200MG /24HR (Patient taking differently: Take 100 mg by mouth every 2 (two) hours as needed (migraines. (max 2 doses/24 hrs)).)   [DISCONTINUED] varenicline (CHANTIX STARTING MONTH PAK) 0.5 MG X 11 & 1 MG X 42 tablet Take one 0.5 mg tab by mouth once daily for 3 days, then take one 0.5 mg tab twice daily for 4 days, then take one 1 mg tab twice daily.   No facility-administered medications prior to visit.    Review of Systems  Constitutional:  Positive for unexpected weight change. Negative for activity change, appetite change, chills, diaphoresis, fatigue and fever.  Respiratory:  Positive for cough and wheezing. Negative for apnea, choking, chest tightness, shortness of breath and stridor.   Cardiovascular:  Positive for leg swelling. Negative for chest pain and palpitations.  Gastrointestinal:  Positive for vomiting. Negative for abdominal distention, abdominal pain, anal bleeding, blood in stool, constipation, diarrhea, nausea and rectal pain.  Neurological:  Negative for dizziness, syncope, light-headedness and headaches.      Objective    BP (!) 148/84 (BP Location: Right Arm, Patient Position: Sitting, Cuff Size: Large)   Pulse 98   Wt 138 lb (62.6 kg)   SpO2 99%   BMI 22.27 kg/m     Physical Exam  General appearance: Well developed, well nourished male, cooperative and in no acute distress Head: Normocephalic, without obvious abnormality, atraumatic Respiratory: Respirations even and unlabored, normal respiratory rate Extremities: 2+ bilateral foot and  ankle edema.     Assessment & Plan     1. Hyponatremia Now off of hctz.  - Renal function panel  2. Primary hypertension Fairly well controlled on low dose amlodipine, although not at goal. Dose limited by LE edema. Off hctz due to hyponatremia.   3. Edema, unspecified type Exacerbated by furosemide.  Consider prn loop diuretic if sodium level is near normal.   Start back on - traMADol (ULTRAM) 50 MG tablet; Take 1 tablet (50 mg total) by mouth every 8 (eight) hours as needed.  Dispense: 30 tablet; Refill: 3 for back pain.        The entirety of the information documented in the History of Present Illness, Review of Systems and Physical Exam were personally obtained by me. Portions of this information were initially documented by the CMA and reviewed by me for thoroughness and accuracy.     Lelon Huh, MD  [facility] [phone] (phone) [phone] (fax)  [place]

## 2020-10-14 ENCOUNTER — Other Ambulatory Visit: Payer: Self-pay | Admitting: Family Medicine

## 2020-10-14 DIAGNOSIS — R609 Edema, unspecified: Secondary | ICD-10-CM

## 2020-10-14 LAB — RENAL FUNCTION PANEL
Albumin: 4.5 g/dL (ref 3.8–4.8)
BUN/Creatinine Ratio: 11 (ref 10–24)
BUN: 12 mg/dL (ref 8–27)
CO2: 24 mmol/L (ref 20–29)
Calcium: 9 mg/dL (ref 8.6–10.2)
Chloride: 98 mmol/L (ref 96–106)
Creatinine, Ser: 1.06 mg/dL (ref 0.76–1.27)
Glucose: 123 mg/dL — ABNORMAL HIGH (ref 65–99)
Phosphorus: 2.5 mg/dL — ABNORMAL LOW (ref 2.8–4.1)
Potassium: 5.3 mmol/L — ABNORMAL HIGH (ref 3.5–5.2)
Sodium: 136 mmol/L (ref 134–144)
eGFR: 78 mL/min/{1.73_m2} (ref >59)

## 2020-10-14 MED ORDER — FUROSEMIDE 20 MG PO TABS: 20.0000 mg | ORAL_TABLET | ORAL | 2 refills | Status: DC | PRN

## 2020-10-30 ENCOUNTER — Other Ambulatory Visit: Payer: Self-pay | Admitting: Family Medicine

## 2020-10-30 DIAGNOSIS — I10 Essential (primary) hypertension: Secondary | ICD-10-CM

## 2020-11-18 ENCOUNTER — Other Ambulatory Visit: Payer: Self-pay

## 2020-11-18 DIAGNOSIS — G8929 Other chronic pain: Secondary | ICD-10-CM

## 2020-11-18 NOTE — Telephone Encounter (Signed)
Copied from [place] [phone]. Topic: General - Other >> Nov 18, 2020  1:39 PM Leward Quan A wrote: Reason for CRM: Patient called in to schedule an appointment with Dr Caryn Section to discuss his pain medication first available appointment was 12/23/20 he say its too far away and would like to be seen before that time but will settle for a phone call from Dr Caryn Section or his nurse. Can be reached at  Ph# [phone]

## 2020-11-18 NOTE — Telephone Encounter (Signed)
I spoke with Lucas Ramirez.  He states tramadol is not helping with his leg pain and sciatica.  He would like to try something stronger.  I advised him he would need an office visit he agreed to a virtual for 11/23/2020 at 1pm.  He requested a refill of tramadol until his appointment.  (He will run out on Friday.)   Thanks,   -Mickel Baas

## 2020-11-19 MED ORDER — TRAMADOL HCL 50 MG PO TABS: 50.0000 mg | ORAL_TABLET | Freq: Three times a day (TID) | ORAL | 0 refills | Status: DC | PRN

## 2020-11-23 ENCOUNTER — Telehealth (INDEPENDENT_AMBULATORY_CARE_PROVIDER_SITE_OTHER): Payer: BC Managed Care – PPO | Admitting: Family Medicine

## 2020-11-23 DIAGNOSIS — G629 Polyneuropathy, unspecified: Secondary | ICD-10-CM

## 2020-11-23 DIAGNOSIS — M4135 Thoracogenic scoliosis, thoracolumbar region: Secondary | ICD-10-CM | POA: Diagnosis not present

## 2020-11-23 DIAGNOSIS — G8929 Other chronic pain: Secondary | ICD-10-CM

## 2020-11-23 DIAGNOSIS — M5441 Lumbago with sciatica, right side: Secondary | ICD-10-CM

## 2020-11-23 MED ORDER — BUPRENORPHINE HCL-NALOXONE HCL 8-2 MG SL SUBL: SUBLINGUAL_TABLET | SUBLINGUAL | 0 refills | Status: DC

## 2020-11-23 NOTE — Progress Notes (Signed)
MyChart Video Visit    Virtual Visit via Video Note   This visit type was conducted due to national recommendations for restrictions regarding the COVID-19 Pandemic (e.g. social distancing) in an effort to limit this patient's exposure and mitigate transmission in our community. This patient is at least at moderate risk for complications without adequate follow up. This format is felt to be most appropriate for this patient at this time. Physical exam was limited by quality of the video and audio technology used for the visit.   Patient location: home Provider location: [facility]  I discussed the limitations of evaluation and management by telemedicine and the availability of in person appointments. The patient expressed understanding and agreed to proceed.  Patient: Lucas Ramirez   DOB: 05/11/1955   65 y.o. Male  MRN: [id number] Visit Date: 11/23/2020  Today's healthcare provider: Lelon Huh, MD   No chief complaint on file.  Subjective    Back Pain This is a chronic problem. The pain is present in the lumbar spine. Radiates to: Right leg. Associated symptoms include leg pain and weakness (Right leg weakness.). Pertinent negatives include no abdominal pain, bladder incontinence, bowel incontinence, fever, numbness or tingling.    He has been seeing Nathanial Rancher in the spring for pain management on put on Suboxone 8-2 1/2 tablet twice a day and reports that was very effective and well tolerated. Due to insurance restrictions he has not been able to get back with Dr. Humphrey Rolls an has since changed back to tramadol which he states is not helping. He has also been followed by [facility] orthopedics.     Medications: Outpatient Medications Prior to Visit  Medication Sig   acetaminophen (TYLENOL) 500 MG tablet Take 1 tablet (500 mg total) by mouth every 6 (six) hours as needed for mild pain.   amLODipine (NORVASC) 2.5 MG tablet TAKE 1 TABLET BY MOUTH ONCE DAILY   aspirin EC 81 MG EC tablet Take 1  tablet (81 mg total) by mouth daily. Swallow whole.   buprenorphine (SUBUTEX) 8 MG SUBL SL tablet Place 4 mg under the tongue 2 (two) times daily.   calcium carbonate (OSCAL) 1500 (600 Ca) MG TABS tablet Take 600 mg of elemental calcium by mouth 2 (two) times daily with a meal.   cholecalciferol (VITAMIN D) 1000 UNITS tablet Take 1,000 Units by mouth daily.   clopidogrel (PLAVIX) 75 MG tablet Take 1 tablet (75 mg total) by mouth daily.   denosumab (PROLIA) 60 MG/ML SOSY injection Inject 60 mg into the skin every 6 (six) months.   DULoxetine (CYMBALTA) 20 MG capsule Take 20 mg by mouth 2 (two) times daily. Morning & afternoon   furosemide (LASIX) 20 MG tablet Take 1 tablet (20 mg total) by mouth every other day as needed for edema.   gabapentin (NEURONTIN) 800 MG tablet TAKE 1 TABLET BY MOUTH 3 TIMES DAILY AS NEEDED AS DIRECTED   latanoprost (XALATAN) 0.005 % ophthalmic solution Place 1-2 drops into both eyes See admin instructions. Instill 1 drop into left eye at bedtime Instill 2 drops into right eye at bedtime   lisinopril (ZESTRIL) 20 MG tablet TAKE 1 TABLET BY MOUTH ONCE DAILY   omeprazole (PRILOSEC) 20 MG capsule TAKE 1 CAPSULE BY MOUTH ONCE DAILY   rosuvastatin (CRESTOR) 20 MG tablet Take 1 tablet (20 mg total) by mouth daily.   SF 5000 PLUS 1.1 % CREA dental cream Place 1 application onto teeth in the morning and at bedtime.  traMADol (ULTRAM) 50 MG tablet Take 1 tablet (50 mg total) by mouth every 8 (eight) hours as needed.   varenicline (CHANTIX) 1 MG tablet Take by mouth.   No facility-administered medications prior to visit.    Review of Systems  Constitutional: Negative.  Negative for fever.  Gastrointestinal:  Negative for abdominal pain and bowel incontinence.  Genitourinary:  Negative for bladder incontinence.  Musculoskeletal:  Positive for back pain, gait problem and myalgias. Negative for arthralgias, joint swelling, neck pain and neck stiffness.  Neurological:  Positive  for weakness (Right leg weakness.). Negative for tingling and numbness.     Objective    There were no vitals taken for this visit.   Physical Exam   Awake, alert, oriented x 3. In no apparent distress    Assessment & Plan     1. Neuropathy   2. Thoracogenic scoliosis of thoracolumbar region   3. Chronic right-sided low back pain with right-sided sciatica Did well with suboxone trial earlier this year prescribed by Dr. Nathanial Rancher, but unable to follow up with him due to insurance restrictions. No indication of having or needing treating treatment for opioid abuse. Will change tramadol back to - buprenorphine-naloxone (SUBOXONE) 8-2 mg SUBL SL tablet; 1/2 tablet twice a day  Dispense: 30 tablet; Refill: 0       I discussed the assessment and treatment plan with the patient. The patient was provided an opportunity to ask questions and all were answered. The patient agreed with the plan and demonstrated an understanding of the instructions.   The patient was advised to call back or seek an in-person evaluation if the symptoms worsen or if the condition fails to improve as anticipated.  I provided 10 minutes of non-face-to-face time during this encounter.  The entirety of the information documented in the History of Present Illness, Review of Systems and Physical Exam were personally obtained by me. Portions of this information were initially documented by the CMA and reviewed by me for thoroughness and accuracy.    Lelon Huh, MD [facility] [phone] (phone) [phone] (fax)  [place]

## 2020-12-21 ENCOUNTER — Other Ambulatory Visit: Payer: Self-pay | Admitting: Family Medicine

## 2020-12-21 DIAGNOSIS — G8929 Other chronic pain: Secondary | ICD-10-CM

## 2020-12-21 NOTE — Telephone Encounter (Signed)
Requested medication (s) are due for refill today: yes  Requested medication (s) are on the active medication list: yes  Last refill:  11/23/20  Future visit scheduled: no  Notes to clinic:  med not delegated to NT to RF   Requested Prescriptions  Pending Prescriptions Disp Refills   buprenorphine-naloxone (SUBOXONE) 8-2 mg SUBL SL tablet [Pharmacy Med Name: BUPRENORPHINE HCL-NALOXONE HCL 8-2] 30 tablet     Sig: DISSOLVE 1/2 TABLET UNDER THE TONGUE TWICE DAILY     Not Delegated - Analgesics:  Opioid Agonist Combinations Failed - 12/21/2020 11:09 AM      Failed - This refill cannot be delegated      Failed - Urine Drug Screen completed in last 360 days      Passed - Valid encounter within last 6 months    Recent Outpatient Visits           4 weeks ago Chronic right-sided low back pain with right-sided sciatica   [facility] Birdie Sons, MD   2 months ago Hyponatremia   [facility] Birdie Sons, MD   3 months ago Cerebrovascular disease   [facility] Birdie Sons, MD   1 year ago Essential hypertension   [facility] Birdie Sons, MD   1 year ago Essential hypertension   [facility], Kirstie Peri, MD

## 2020-12-22 MED ORDER — BUPRENORPHINE HCL-NALOXONE HCL 8-2 MG SL SUBL: SUBLINGUAL_TABLET | SUBLINGUAL | 0 refills | Status: DC

## 2020-12-22 NOTE — Addendum Note (Signed)
Addended by: Birdie Sons on: 12/22/2020 07:37 AM   Modules accepted: Orders

## 2020-12-29 ENCOUNTER — Other Ambulatory Visit: Payer: Self-pay

## 2020-12-29 ENCOUNTER — Ambulatory Visit (INDEPENDENT_AMBULATORY_CARE_PROVIDER_SITE_OTHER): Payer: Medicare PPO

## 2020-12-29 ENCOUNTER — Ambulatory Visit (INDEPENDENT_AMBULATORY_CARE_PROVIDER_SITE_OTHER): Payer: Medicare PPO | Admitting: Nurse Practitioner

## 2020-12-29 ENCOUNTER — Other Ambulatory Visit: Payer: Self-pay | Admitting: Family Medicine

## 2020-12-29 VITALS — BP 161/77 | HR 98 | Ht 68.0 in | Wt 128.0 lb

## 2020-12-29 DIAGNOSIS — I6523 Occlusion and stenosis of bilateral carotid arteries: Secondary | ICD-10-CM

## 2020-12-29 DIAGNOSIS — G629 Polyneuropathy, unspecified: Secondary | ICD-10-CM

## 2020-12-29 DIAGNOSIS — I10 Essential (primary) hypertension: Secondary | ICD-10-CM

## 2020-12-29 DIAGNOSIS — E785 Hyperlipidemia, unspecified: Secondary | ICD-10-CM

## 2021-01-03 ENCOUNTER — Encounter (INDEPENDENT_AMBULATORY_CARE_PROVIDER_SITE_OTHER): Payer: Self-pay | Admitting: Nurse Practitioner

## 2021-01-03 NOTE — Progress Notes (Signed)
Subjective:    Patient ID: Lucas Ramirez, male    DOB: Feb 07, 1956, 65 y.o.   MRN: [id number] Chief Complaint  Patient presents with   Follow-up    3 Mo carotid    Lucas Ramirez is a 65 y.o. male.  He is following up today in regards to his extensive extracranial cerebrovascular disease.  He previously had a CT angiogram which demonstrated extensive extracranial cerebrovascular disease.  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  The right vertebral appears to have some narrowing.  The left carotid artery is patent.  The patient's vision remains the same.  He denies any new or worsening symptoms such as weakness or any visual disturbances.   Today noninvasive studies show a right occlusion of the ICA which was noted from CT scan.  A minimal 1 to 39% stenosis of the left ICA.  Vertebral arteries demonstrate antegrade flow bilaterally with normal flow hemodynamics in the bilateral subclavian arteries.     Review of Systems  Eyes:  Positive for visual disturbance.  Neurological:  Negative for weakness.  All other systems reviewed and are negative.     Objective:   Physical Exam Vitals reviewed.  HENT:     Head: Normocephalic.  Cardiovascular:     Rate and Rhythm: Normal rate.     Pulses: Normal pulses.  Pulmonary:     Effort: Pulmonary effort is normal.  Skin:    General: Skin is warm and dry.  Neurological:     Mental Status: He is alert and oriented to person, place, and time.  Psychiatric:        Mood and Affect: Mood normal.        Behavior: Behavior normal.        Thought Content: Thought content normal.        Judgment: Judgment normal.    BP (!) 161/77   Pulse 98   Ht '5\' 8"'  (1.727 m)   Wt 128 lb (58.1 kg)   BMI 19.46 kg/m   Past Medical History:  Diagnosis Date   Bell's palsy    CAP (community acquired pneumonia) 06/28/2015   COPD (chronic obstructive pulmonary disease) (HCC)    Depression    GERD  (gastroesophageal reflux disease)    Glaucoma    Hyperlipidemia    Hypertension    Migraine    Scoliosis    Shingles 2019   dx by dermatologist by patient report    Social History   Socioeconomic History   Marital status: Married    Spouse name: Not on file   Number of children: Not on file   Years of education: Not on file   Highest education level: Not on file  Occupational History   Not on file  Tobacco Use   Smoking status: Every Day    Packs/day: 0.10    Years: 35.00    Pack years: 3.50    Types: Cigarettes   Smokeless tobacco: Never  Vaping Use   Vaping Use: Some days  Substance and Sexual Activity   Alcohol use: No   Drug use: No   Sexual activity: Yes  Other Topics Concern   Not on file  Social History Narrative   Not on file   Social Determinants of Health   Financial Resource Strain: Not on file  Food Insecurity: Not on file  Transportation Needs: Not on file  Physical Activity: Not on file  Stress: Not on file  Social Connections: Not on file  Intimate Partner Violence: Not on file    Past Surgical History:  Procedure Laterality Date   BACK SURGERY  1963   correcting Scoliosils per pt   COLONOSCOPY WITH PROPOFOL N/A 07/04/2019   Procedure: COLONOSCOPY WITH PROPOFOL;  Surgeon: Lin Landsman, MD;  Location: [facility] ENDOSCOPY;  Service: Gastroenterology;  Laterality: N/A;  PRIORITY 4   LUMBAR LAMINECTOMY/ DECOMPRESSION WITH MET-RX Right 10/30/2019   Procedure: L4-5 DECOMPRESSION;  Surgeon: Meade Maw, MD;  Location: [facility] ORS;  Service: Neurosurgery;  Laterality: Right;   SKIN LESION EXCISION  06/06/2013   facial left cheek. Dr. Evorn Gong   UPPER GI ENDOSCOPY  07/11/2008   [place]. Dr. Donnella Sham. Impression. -LA Grade C reflux esophagitis. 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    Family History  Problem Relation Age of Onset   Emphysema Mother    Down syndrome Sister    CAD Neg Hx    Heart disease Neg Hx    Colon  cancer Neg Hx    Prostate cancer Neg Hx     Allergies  Allergen Reactions   Bupropion Hcl Other (See Comments)    seizures    CBC Latest Ref Rng & Units 08/31/2020 08/21/2020 08/20/2020  WBC 3.4 - 10.8 x10E3/uL 7.5 5.7 6.3  Hemoglobin 13.0 - 17.7 g/dL 12.3(L) 11.5(L) 11.0(L)  Hematocrit 37.5 - 51.0 % 36.4(L) 33.2(L) 33.1(L)  Platelets 150 - 450 x10E3/uL 332 318 330      CMP     Component Value Date/Time   NA 136 10/13/2020 1406   NA 140 02/01/2014 1734   K 5.3 (H) 10/13/2020 1406   K 3.6 02/01/2014 1734   CL 98 10/13/2020 1406   CL 103 02/01/2014 1734   CO2 24 10/13/2020 1406   CO2 30 02/01/2014 1734   GLUCOSE 123 (H) 10/13/2020 1406   GLUCOSE 104 (H) 08/21/2020 0519   GLUCOSE 88 02/01/2014 1734   BUN 12 10/13/2020 1406   BUN 11 02/01/2014 1734   CREATININE 1.06 10/13/2020 1406   CREATININE 1.06 02/01/2014 1734   CALCIUM 9.0 10/13/2020 1406   CALCIUM 8.4 (L) 02/01/2014 1734   PROT 6.5 08/31/2020 1626   PROT 7.5 02/01/2014 1734   ALBUMIN 4.5 10/13/2020 1406   ALBUMIN 3.5 02/01/2014 1734   AST 16 08/31/2020 1626   AST 22 02/01/2014 1734   ALT 11 08/31/2020 1626   ALT 25 02/01/2014 1734   ALKPHOS 76 08/31/2020 1626   ALKPHOS 106 02/01/2014 1734   BILITOT <0.2 08/31/2020 1626   BILITOT 0.2 02/01/2014 1734   GFRNONAA >60 08/21/2020 0519   GFRNONAA >60 02/01/2014 1734   GFRAA 45 (L) 10/24/2019 0913   GFRAA >60 02/01/2014 1734     No results found.     Assessment & Plan:   1. Bilateral carotid artery stenosis Recommend:  Given the patient's asymptomatic extracranial cerebrovascular disease, there is no further intervention indicated at this time.  Continue antiplatelet therapy as prescribed Continue management of CAD, HTN and Hyperlipidemia Healthy heart diet,  encouraged exercise at least 4 times per week Follow up in 6 months with duplex ultrasound and physical exam    2. Primary hypertension Continue antihypertensive medications as already ordered,  these medications have been reviewed and there are no changes at this time.   3. Hyperlipidemia, unspecified hyperlipidemia type Continue statin as ordered and reviewed, no changes at this time    Current Outpatient Medications on File Prior to Visit  Medication Sig Dispense Refill  acetaminophen (TYLENOL) 500 MG tablet Take 1 tablet (500 mg total) by mouth every 6 (six) hours as needed for mild pain. 30 tablet 0   amLODipine (NORVASC) 2.5 MG tablet TAKE 1 TABLET BY MOUTH ONCE DAILY 90 tablet 1   aspirin EC 81 MG EC tablet Take 1 tablet (81 mg total) by mouth daily. Swallow whole.     buprenorphine (SUBUTEX) 8 MG SUBL SL tablet Place 4 mg under the tongue 2 (two) times daily.     buprenorphine-naloxone (SUBOXONE) 8-2 mg SUBL SL tablet DISSOLVE 1/2 TABLET UNDER THE TONGUE TWICE DAILY 30 tablet 0   calcium carbonate (OSCAL) 1500 (600 Ca) MG TABS tablet Take 600 mg of elemental calcium by mouth 2 (two) times daily with a meal.     cholecalciferol (VITAMIN D) 1000 UNITS tablet Take 1,000 Units by mouth daily.     clopidogrel (PLAVIX) 75 MG tablet Take 1 tablet (75 mg total) by mouth daily. 30 tablet 11   denosumab (PROLIA) 60 MG/ML SOSY injection Inject 60 mg into the skin every 6 (six) months.     DULoxetine (CYMBALTA) 20 MG capsule Take 20 mg by mouth 2 (two) times daily. Morning & afternoon     furosemide (LASIX) 20 MG tablet Take 1 tablet (20 mg total) by mouth every other day as needed for edema. 30 tablet 2   hydrochlorothiazide (HYDRODIURIL) 25 MG tablet Take 25 mg by mouth daily.     latanoprost (XALATAN) 0.005 % ophthalmic solution Place 1-2 drops into both eyes See admin instructions. Instill 1 drop into left eye at bedtime Instill 2 drops into right eye at bedtime     lisinopril (ZESTRIL) 20 MG tablet TAKE 1 TABLET BY MOUTH ONCE DAILY 90 tablet 1   omeprazole (PRILOSEC) 20 MG capsule TAKE 1 CAPSULE BY MOUTH ONCE DAILY 30 capsule 11   rosuvastatin (CRESTOR) 20 MG tablet Take 1 tablet  (20 mg total) by mouth daily. 90 tablet 3   SF 5000 PLUS 1.1 % CREA dental cream Place 1 application onto teeth in the morning and at bedtime.     varenicline (CHANTIX) 1 MG tablet Take by mouth.     No current facility-administered medications on file prior to visit.    There are no Patient Instructions on file for this visit. No follow-ups on file.   Kris Hartmann, NP

## 2021-01-19 ENCOUNTER — Other Ambulatory Visit: Payer: Self-pay | Admitting: Family Medicine

## 2021-01-19 DIAGNOSIS — M5441 Lumbago with sciatica, right side: Secondary | ICD-10-CM

## 2021-01-19 DIAGNOSIS — G8929 Other chronic pain: Secondary | ICD-10-CM

## 2021-01-19 MED ORDER — BUPRENORPHINE HCL-NALOXONE HCL 8-2 MG SL SUBL: SUBLINGUAL_TABLET | SUBLINGUAL | 0 refills | Status: DC

## 2021-01-19 NOTE — Addendum Note (Signed)
Addended by: Birdie Sons on: 01/19/2021 06:21 PM   Modules accepted: Orders

## 2021-02-14 ENCOUNTER — Other Ambulatory Visit: Payer: Self-pay | Admitting: Family Medicine

## 2021-02-14 DIAGNOSIS — I10 Essential (primary) hypertension: Secondary | ICD-10-CM

## 2021-02-14 DIAGNOSIS — G8929 Other chronic pain: Secondary | ICD-10-CM

## 2021-02-15 ENCOUNTER — Telehealth: Payer: Self-pay | Admitting: Family Medicine

## 2021-02-15 DIAGNOSIS — G8929 Other chronic pain: Secondary | ICD-10-CM

## 2021-02-15 NOTE — Telephone Encounter (Signed)
Pt states his insurance company will not pay for the buprenorphine-naloxone (SUBOXONE) 8-2 mg SUBL SL tablet   It is not in their formulary, and it is requiring a PA.  They will pay for the sublingual film  Buprenorphine / Naloxone.  Provider line [phone] Please advise  Pt needs refill by Friday.

## 2021-02-16 MED ORDER — BUPRENORPHINE HCL-NALOXONE HCL 4-1 MG SL FILM: 1.0000 | ORAL_FILM | Freq: Two times a day (BID) | SUBLINGUAL | 0 refills | Status: DC

## 2021-02-22 ENCOUNTER — Other Ambulatory Visit: Payer: Self-pay | Admitting: Family Medicine

## 2021-02-22 DIAGNOSIS — G8929 Other chronic pain: Secondary | ICD-10-CM

## 2021-02-23 NOTE — Telephone Encounter (Signed)
Requested medication (s) are due for refill today:   Provider to review  Requested medication (s) are on the active medication list:   Yes  Future visit scheduled:   No   Last ordered: 02/16/2021 #60, 0 refills  Non delegated refill.   Pharmacy needing additional information   Requested Prescriptions  Pending Prescriptions Disp Refills   Buprenorphine HCl-Naloxone HCl 4-1 MG FILM [Pharmacy Med Name: BUPRENORPHINE HCL-NALOXONE HCL 4-1] 60 each     Sig: DISSOLVE 1 FILM UNDER THE TONGUE TWICE DAILY     Not Delegated - Analgesics:  Opioid Agonist Combinations Failed - 02/22/2021  2:05 PM      Failed - This refill cannot be delegated      Failed - Urine Drug Screen completed in last 360 days      Passed - Valid encounter within last 6 months    Recent Outpatient Visits           3 months ago Chronic right-sided low back pain with right-sided sciatica   [facility] Birdie Sons, MD   4 months ago Hyponatremia   [facility] Birdie Sons, MD   5 months ago Cerebrovascular disease   [facility] Birdie Sons, MD   1 year ago Essential hypertension   [facility] Birdie Sons, MD   1 year ago Essential hypertension   [place], Kirstie Peri, MD

## 2021-03-24 ENCOUNTER — Other Ambulatory Visit: Payer: Self-pay | Admitting: Family Medicine

## 2021-03-24 DIAGNOSIS — G8929 Other chronic pain: Secondary | ICD-10-CM

## 2021-03-24 MED ORDER — BUPRENORPHINE HCL-NALOXONE HCL 4-1 MG SL FILM: ORAL_FILM | SUBLINGUAL | 0 refills | Status: DC

## 2021-03-24 NOTE — Addendum Note (Signed)
Addended by: Birdie Sons on: 03/24/2021 09:41 AM   Modules accepted: Orders

## 2021-04-03 ENCOUNTER — Other Ambulatory Visit: Payer: Self-pay | Admitting: Family Medicine

## 2021-04-03 DIAGNOSIS — R609 Edema, unspecified: Secondary | ICD-10-CM

## 2021-04-03 NOTE — Telephone Encounter (Signed)
Requested medication (s) are due for refill today: yes  Requested medication (s) are on the active medication list: yes  Last refill:  10/14/20 #30 2 RF  Future visit scheduled: yes  Notes to clinic:  pt did not have f/u in September (based on results notes for last CMP 10/13/20)   Requested Prescriptions  Pending Prescriptions Disp Refills   furosemide (LASIX) 20 MG tablet [Pharmacy Med Name: FUROSEMIDE 20 MG TAB] 30 tablet 2    Sig: TAKE 1 TABLET BY MOUTH EVERY OTHER DAY AS NEEDED FOR EDEMA     Cardiovascular:  Diuretics - Loop Failed - 04/03/2021 11:35 AM      Failed - K in normal range and within 360 days    Potassium  Date Value Ref Range Status  10/13/2020 5.3 (H) 3.5 - 5.2 mmol/L Final  02/01/2014 3.6 3.5 - 5.1 mmol/L Final          Failed - Last BP in normal range    BP Readings from Last 1 Encounters:  12/29/20 (!) 161/77          Passed - Ca in normal range and within 360 days    Calcium  Date Value Ref Range Status  10/13/2020 9.0 8.6 - 10.2 mg/dL Final   Calcium, Total  Date Value Ref Range Status  02/01/2014 8.4 (L) 8.5 - 10.1 mg/dL Final          Passed - Na in normal range and within 360 days    Sodium  Date Value Ref Range Status  10/13/2020 136 134 - 144 mmol/L Final  02/01/2014 140 136 - 145 mmol/L Final          Passed - Cr in normal range and within 360 days    Creatinine  Date Value Ref Range Status  02/01/2014 1.06 0.60 - 1.30 mg/dL Final   Creatinine, Ser  Date Value Ref Range Status  10/13/2020 1.06 0.76 - 1.27 mg/dL Final          Passed - Valid encounter within last 6 months    Recent Outpatient Visits           4 months ago Chronic right-sided low back pain with right-sided sciatica   [facility] Birdie Sons, MD   5 months ago Hyponatremia   [facility] Birdie Sons, MD   7 months ago Cerebrovascular disease   [facility] Birdie Sons, MD   1 year ago Essential  hypertension   [facility] Birdie Sons, MD   1 year ago Essential hypertension   [facility] Birdie Sons, MD

## 2021-04-20 ENCOUNTER — Other Ambulatory Visit: Payer: Self-pay | Admitting: Family Medicine

## 2021-04-20 DIAGNOSIS — M5441 Lumbago with sciatica, right side: Secondary | ICD-10-CM

## 2021-04-20 NOTE — Telephone Encounter (Signed)
Requested medication (s) are due for refill today: yes  Requested medication (s) are on the active medication list: yes  Last refill:  03/24/21 #60  Future visit scheduled: yes  Notes to clinic:  Please review for refill. Refill not delegated per protocol    Requested Prescriptions  Pending Prescriptions Disp Refills   Buprenorphine HCl-Naloxone HCl 4-1 MG FILM [Pharmacy Med Name: BUPRENORPHINE HCL-NALOXONE HCL 4-1] 60 each     Sig: USE 1 FILM UNDER THE TONGUE TWICE DAILY AS DIRECTED     Not Delegated - Analgesics:  Opioid Agonist Combinations Failed - 04/20/2021 10:24 AM      Failed - This refill cannot be delegated      Failed - Urine Drug Screen completed in last 360 days      Passed - Valid encounter within last 6 months    Recent Outpatient Visits           4 months ago Chronic right-sided low back pain with right-sided sciatica   [facility] Birdie Sons, MD   6 months ago Hyponatremia   [facility] Birdie Sons, MD   7 months ago Cerebrovascular disease   [facility] Birdie Sons, MD   1 year ago Essential hypertension   [facility] Birdie Sons, MD   1 year ago Essential hypertension   [facility] Birdie Sons, MD       Future Appointments             In 2 months Fisher, Kirstie Peri, MD [facility], [place]

## 2021-04-22 ENCOUNTER — Other Ambulatory Visit: Payer: Self-pay | Admitting: Family Medicine

## 2021-04-22 DIAGNOSIS — G8929 Other chronic pain: Secondary | ICD-10-CM

## 2021-04-22 NOTE — Telephone Encounter (Signed)
Requested medication (s) are due for refill today: No  Requested medication (s) are on the active medication list: Yes  Last refill: 04/21/21  Future visit scheduled: Yes  Notes to clinic: Refill cannot be delegated        Requested Prescriptions  Pending Prescriptions Disp Refills   Buprenorphine HCl-Naloxone HCl 4-1 MG FILM [Pharmacy Med Name: BUPRENORPHINE HCL-NALOXONE HCL 4-1] 60 each     Sig: USE 1 FILM UNDER THE TONGUE TWICE DAILY AS DIRECTED     Not Delegated - Analgesics:  Opioid Agonist Combinations Failed - 04/22/2021 11:14 AM      Failed - This refill cannot be delegated      Failed - Urine Drug Screen completed in last 360 days      Passed - Valid encounter within last 6 months    Recent Outpatient Visits           5 months ago Chronic right-sided low back pain with right-sided sciatica   [facility] Birdie Sons, MD   6 months ago Hyponatremia   [facility] Birdie Sons, MD   7 months ago Cerebrovascular disease   [facility] Birdie Sons, MD   1 year ago Essential hypertension   [facility] Birdie Sons, MD   1 year ago Essential hypertension   [place], Kirstie Peri, MD       Future Appointments             In 2 months Fisher, Kirstie Peri, MD [facility], [place]

## 2021-05-18 ENCOUNTER — Other Ambulatory Visit: Payer: Self-pay | Admitting: Family Medicine

## 2021-05-19 ENCOUNTER — Other Ambulatory Visit: Payer: Self-pay | Admitting: Family Medicine

## 2021-05-19 DIAGNOSIS — M5441 Lumbago with sciatica, right side: Secondary | ICD-10-CM

## 2021-05-19 DIAGNOSIS — G8929 Other chronic pain: Secondary | ICD-10-CM

## 2021-05-21 MED ORDER — BUPRENORPHINE HCL-NALOXONE HCL 4-1 MG SL FILM: ORAL_FILM | SUBLINGUAL | 0 refills | Status: DC

## 2021-05-21 NOTE — Addendum Note (Signed)
Addended by: Birdie Sons on: 05/21/2021 07:18 AM   Modules accepted: Orders

## 2021-05-28 ENCOUNTER — Other Ambulatory Visit
Admission: RE | Admit: 2021-05-28 | Discharge: 2021-05-28 | Disposition: A | Discharge status: Home / Self Care | Payer: Medicare PPO | Source: Ambulatory Visit | Attending: Ophthalmology | Admitting: Ophthalmology

## 2021-05-28 DIAGNOSIS — H16001 Unspecified corneal ulcer, right eye: Secondary | ICD-10-CM | POA: Insufficient documentation

## 2021-06-01 DIAGNOSIS — F17200 Nicotine dependence, unspecified, uncomplicated: Secondary | ICD-10-CM | POA: Insufficient documentation

## 2021-06-01 DIAGNOSIS — I10 Essential (primary) hypertension: Secondary | ICD-10-CM | POA: Insufficient documentation

## 2021-06-01 LAB — AEROBIC CULTURE W GRAM STAIN (SUPERFICIAL SPECIMEN)

## 2021-06-17 ENCOUNTER — Other Ambulatory Visit: Payer: Self-pay | Admitting: Family Medicine

## 2021-06-17 DIAGNOSIS — G8929 Other chronic pain: Secondary | ICD-10-CM

## 2021-06-19 ENCOUNTER — Other Ambulatory Visit: Payer: Self-pay | Admitting: Family Medicine

## 2021-06-19 DIAGNOSIS — G8929 Other chronic pain: Secondary | ICD-10-CM

## 2021-06-21 NOTE — Telephone Encounter (Signed)
Requested medication (s) are due for refill today: signed 06/18/21 7:58 am   Requested medication (s) are on the active medication list: yes  Last refill:  06/18/21 #60 0 refills   Future visit scheduled: yes in 1 week  Notes to clinic:  Maximum MME cannot be calculated for this prescription. Enter discrete sig details to calculate maximum MME  Receipt confirmed by pharmacy 06/18/21 7:58 am  no valid encounter not delegated per protocol.      Requested Prescriptions  Pending Prescriptions Disp Refills   Buprenorphine HCl-Naloxone HCl 4-1 MG FILM [Pharmacy Med Name: BUPRENORPHINE HCL-NALOXONE HCL 4-1] 60 each     Sig: DISSOLVE 1 FILM UNDER THE TONGUE TWICE DAILY AS NEEDED     Not Delegated - Analgesics:  Opioid Agonist Combinations Failed - 06/19/2021 10:06 AM      Failed - This refill cannot be delegated      Failed - Urine Drug Screen completed in last 360 days      Failed - Valid encounter within last 3 months    Recent Outpatient Visits           7 months ago Chronic right-sided low back pain with right-sided sciatica   [facility] Birdie Sons, MD   8 months ago Hyponatremia   [facility] Birdie Sons, MD   9 months ago Cerebrovascular disease   [facility] Birdie Sons, MD   1 year ago Essential hypertension   [facility] Birdie Sons, MD   1 year ago Essential hypertension   [place], Kirstie Peri, MD       Future Appointments             In 1 week Fisher, Kirstie Peri, MD [facility], [place]

## 2021-06-25 LAB — FUNGAL ORGANISM REFLEX

## 2021-06-25 LAB — FUNGUS CULTURE WITH STAIN

## 2021-06-28 ENCOUNTER — Ambulatory Visit: Payer: BC Managed Care – PPO | Admitting: Family Medicine

## 2021-06-28 ENCOUNTER — Other Ambulatory Visit (INDEPENDENT_AMBULATORY_CARE_PROVIDER_SITE_OTHER): Payer: Self-pay | Admitting: Nurse Practitioner

## 2021-06-28 ENCOUNTER — Encounter (INDEPENDENT_AMBULATORY_CARE_PROVIDER_SITE_OTHER): Payer: Medicare PPO

## 2021-06-28 DIAGNOSIS — I6523 Occlusion and stenosis of bilateral carotid arteries: Secondary | ICD-10-CM

## 2021-06-28 NOTE — Progress Notes (Deleted)
?  ? ? ?  Established patient visit ? ? ?Patient: Lucas Ramirez   DOB: 19-May-1955   66 y.o. Male  MRN: [id number] ?Visit Date: 06/28/2021 ? ?Today's healthcare provider: Lelon Huh, MD  ? ?No chief complaint on file. ? ?Subjective  ?  ?HPI  ? ? ?Medications: ?Outpatient Medications Prior to Visit  ?Medication Sig  ? acetaminophen (TYLENOL) 500 MG tablet Take 1 tablet (500 mg total) by mouth every 6 (six) hours as needed for mild pain.  ? amLODipine (NORVASC) 2.5 MG tablet TAKE 1 TABLET BY MOUTH ONCE DAILY  ? aspirin EC 81 MG EC tablet Take 1 tablet (81 mg total) by mouth daily. Swallow whole.  ? Buprenorphine HCl-Naloxone HCl 4-1 MG FILM DISSOLVE 1 FILM UNDER THE TONGUE TWICE DAILY AS NEEDED  ? calcium carbonate (OSCAL) 1500 (600 Ca) MG TABS tablet Take 600 mg of elemental calcium by mouth 2 (two) times daily with a meal.  ? cholecalciferol (VITAMIN D) 1000 UNITS tablet Take 1,000 Units by mouth daily.  ? clopidogrel (PLAVIX) 75 MG tablet Take 1 tablet (75 mg total) by mouth daily.  ? denosumab (PROLIA) 60 MG/ML SOSY injection Inject 60 mg into the skin every 6 (six) months.  ? DULoxetine (CYMBALTA) 20 MG capsule Take 20 mg by mouth 2 (two) times daily. Morning & afternoon  ? furosemide (LASIX) 20 MG tablet TAKE 1 TABLET BY MOUTH EVERY OTHER DAY AS NEEDED FOR EDEMA  ? gabapentin (NEURONTIN) 800 MG tablet TAKE 1 TABLET BY MOUTH 3 TIMES DAILY AS NEEDED AS DIRECTED  ? hydrochlorothiazide (HYDRODIURIL) 25 MG tablet Take 25 mg by mouth daily.  ? latanoprost (XALATAN) 0.005 % ophthalmic solution Place 1-2 drops into both eyes See admin instructions. Instill 1 drop into left eye at bedtime ?Instill 2 drops into right eye at bedtime  ? lisinopril (ZESTRIL) 20 MG tablet TAKE 1 TABLET BY MOUTH ONCE DAILY  ? omeprazole (PRILOSEC) 20 MG capsule TAKE 1 CAPSULE BY MOUTH ONCE DAILY  ? rosuvastatin (CRESTOR) 20 MG tablet Take 1 tablet (20 mg total) by mouth daily.  ? SF 5000 PLUS 1.1 % CREA dental cream Place 1 application onto  teeth in the morning and at bedtime.  ? varenicline (CHANTIX) 1 MG tablet Take by mouth.  ? ?No facility-administered medications prior to visit.  ? ? ?Review of Systems ? ?{Labs  Heme  Chem  Endocrine  Serology  Results Review (optional):23779} ?  Objective  ?  ?There were no vitals taken for this visit. ?{Show previous vital signs (optional):23777} ? ?Physical Exam  ?*** ? ?No results found for any visits on 06/28/21. ? Assessment & Plan  ?  ? ?*** ? ?No follow-ups on file.  ?   ? ?{provider attestation***:1} ? ? ?Lelon Huh, MD  ?[facility] ?[phone] (phone) ?[phone] (fax) ? ?[facility]  ?

## 2021-06-29 ENCOUNTER — Ambulatory Visit (INDEPENDENT_AMBULATORY_CARE_PROVIDER_SITE_OTHER): Payer: Medicare PPO | Admitting: Nurse Practitioner

## 2021-06-29 ENCOUNTER — Encounter (INDEPENDENT_AMBULATORY_CARE_PROVIDER_SITE_OTHER): Payer: Self-pay | Admitting: Nurse Practitioner

## 2021-06-29 ENCOUNTER — Other Ambulatory Visit: Payer: Self-pay

## 2021-06-29 ENCOUNTER — Ambulatory Visit (INDEPENDENT_AMBULATORY_CARE_PROVIDER_SITE_OTHER): Payer: Medicare PPO

## 2021-06-29 VITALS — BP 124/69 | HR 106 | Resp 16 | Ht 65.0 in | Wt 119.0 lb

## 2021-06-29 DIAGNOSIS — I10 Essential (primary) hypertension: Secondary | ICD-10-CM | POA: Diagnosis not present

## 2021-06-29 DIAGNOSIS — I6523 Occlusion and stenosis of bilateral carotid arteries: Secondary | ICD-10-CM

## 2021-06-29 DIAGNOSIS — E785 Hyperlipidemia, unspecified: Secondary | ICD-10-CM

## 2021-07-02 ENCOUNTER — Other Ambulatory Visit: Payer: Self-pay

## 2021-07-02 ENCOUNTER — Encounter: Payer: Self-pay | Admitting: Family Medicine

## 2021-07-02 ENCOUNTER — Ambulatory Visit (INDEPENDENT_AMBULATORY_CARE_PROVIDER_SITE_OTHER): Payer: Medicare PPO | Admitting: Family Medicine

## 2021-07-02 VITALS — BP 131/69 | HR 92 | Temp 97.7°F | Resp 18 | Wt 119.0 lb

## 2021-07-02 DIAGNOSIS — Z125 Encounter for screening for malignant neoplasm of prostate: Secondary | ICD-10-CM

## 2021-07-02 DIAGNOSIS — M4135 Thoracogenic scoliosis, thoracolumbar region: Secondary | ICD-10-CM

## 2021-07-02 DIAGNOSIS — G629 Polyneuropathy, unspecified: Secondary | ICD-10-CM

## 2021-07-02 DIAGNOSIS — R634 Abnormal weight loss: Secondary | ICD-10-CM

## 2021-07-02 DIAGNOSIS — F17200 Nicotine dependence, unspecified, uncomplicated: Secondary | ICD-10-CM

## 2021-07-02 NOTE — Progress Notes (Unsigned)
I,Roshena L Chambers,acting as a scribe for Lelon Huh, MD.,have documented all relevant documentation on the behalf of Lelon Huh, MD,as directed by  Lelon Huh, MD while in the presence of Lelon Huh, MD.   Established patient visit   Patient: Lucas Ramirez   DOB: 04/28/1955   66 y.o. Male  MRN: [id number] Visit Date: 07/02/2021  Today's healthcare provider: Lelon Huh, MD   Chief Complaint  Patient presents with   Anorexia   Edema   Subjective    HPI  Decrease Appetite: Patient reports that he has lost 30 pounds in the past 6 months. He reports that his appetite is poor during the day. He usually eats one meal per day. Denies abdominal pain, swelling, masses or nausea.  Wt Readings from Last 9 Encounters:  07/02/21 119 lb (54 kg)  06/29/21 119 lb (54 kg)  12/29/20 128 lb (58.1 kg)  10/13/20 138 lb (62.6 kg)  09/29/20 143 lb (64.9 kg)  08/31/20 147 lb 3.2 oz (66.8 kg)  08/19/20 145 lb (65.8 kg)  10/30/19 163 lb 2.3 oz (74 kg)  10/22/19 162 lb (73.5 kg)    Swelling in feet: Patient reports having swelling in both feet for the past 2 weeks. No change in diet, no dyspnea. No LE pain.   Medications: Outpatient Medications Prior to Visit  Medication Sig   acetaminophen (TYLENOL) 500 MG tablet Take 1 tablet (500 mg total) by mouth every 6 (six) hours as needed for mild pain.   amLODipine (NORVASC) 2.5 MG tablet TAKE 1 TABLET BY MOUTH ONCE DAILY   aspirin EC 81 MG EC tablet Take 1 tablet (81 mg total) by mouth daily. Swallow whole.   Buprenorphine HCl-Naloxone HCl 4-1 MG FILM DISSOLVE 1 FILM UNDER THE TONGUE TWICE DAILY AS NEEDED   calcium carbonate (OSCAL) 1500 (600 Ca) MG TABS tablet Take 600 mg of elemental calcium by mouth 2 (two) times daily with a meal.   cholecalciferol (VITAMIN D) 1000 UNITS tablet Take 1,000 Units by mouth daily.   clopidogrel (PLAVIX) 75 MG tablet Take 1 tablet (75 mg total) by mouth daily.   denosumab (PROLIA) 60 MG/ML SOSY  injection Inject 60 mg into the skin every 6 (six) months.   DULoxetine (CYMBALTA) 20 MG capsule Take 20 mg by mouth 2 (two) times daily. Morning & afternoon   furosemide (LASIX) 20 MG tablet TAKE 1 TABLET BY MOUTH EVERY OTHER DAY AS NEEDED FOR EDEMA   gabapentin (NEURONTIN) 800 MG tablet TAKE 1 TABLET BY MOUTH 3 TIMES DAILY AS NEEDED AS DIRECTED   latanoprost (XALATAN) 0.005 % ophthalmic solution Place 1-2 drops into both eyes See admin instructions. Instill 1 drop into left eye at bedtime Instill 2 drops into right eye at bedtime   lisinopril (ZESTRIL) 20 MG tablet TAKE 1 TABLET BY MOUTH ONCE DAILY   omeprazole (PRILOSEC) 20 MG capsule TAKE 1 CAPSULE BY MOUTH ONCE DAILY   rosuvastatin (CRESTOR) 20 MG tablet Take 1 tablet (20 mg total) by mouth daily.   SF 5000 PLUS 1.1 % CREA dental cream Place 1 application onto teeth in the morning and at bedtime.   hydrochlorothiazide (HYDRODIURIL) 25 MG tablet Take 25 mg by mouth daily.   varenicline (CHANTIX) 1 MG tablet Take by mouth.   No facility-administered medications prior to visit.    Review of Systems  Constitutional:  Positive for appetite change and unexpected weight change. Negative for chills and fever.  Respiratory:  Negative for chest tightness,  shortness of breath and wheezing.   Cardiovascular:  Negative for chest pain and palpitations.  Gastrointestinal:  Negative for abdominal pain, nausea and vomiting.  Musculoskeletal:  Positive for joint swelling (in both feet).   {Labs   Heme   Chem   Endocrine   Serology   Results Review (optional):23779}   Objective    BP 131/69 (BP Location: Right Arm, Patient Position: Sitting, Cuff Size: Normal)    Pulse 92    Temp 97.7 F (36.5 C) (Oral)    Resp 18    Wt 119 lb (54 kg)    SpO2 100% Comment: room air   BMI 19.80 kg/m  {Show previous vital signs (optional):23777}  Physical Exam   General: Appearance:    Thin male in no acute distress  Eyes:    PERRL, conjunctiva/corneas clear, EOM's  intact       Lungs:     Clear to auscultation bilaterally, respirations unlabored  Heart:    Normal heart rate. Normal rhythm. No murmurs, rubs, or gallops.    MS:   All extremities are intact.  2+ bilateral lower leg edema. No erythema, no tenderness.   Neurologic:   Awake, alert, oriented x 3. No apparent focal neurological defect.        Assessment & Plan     1. Prostate cancer screening  - PSA Total (Reflex To Free)  2. Abnormal weight loss  - Comprehensive metabolic panel - CBC with Differential/Platelet - TSH Onset after starting suboxone which may contribute to weight loss. Medication was initially recommended at pain clinic, but he does not follow up there anymore due to cost. Was also followed by Dr. Izora Ribas in need of back surgery but was told that he he needs to quit smoking for 30 days which has has not been able to do.   3. Thoracogenic scoliosis of thoracolumbar region   4. Polyneuropathy   5. Compulsive tobacco user syndrome Is working on smoking cessation. He did have chest CT in 2020 and CT of thoracic spine in 2021.     The entirety of the information documented in the History of Present Illness, Review of Systems and Physical Exam were personally obtained by me. Portions of this information were initially documented by the CMA and reviewed by me for thoroughness and accuracy.     Lelon Huh, MD  [facility] [phone] (phone) [phone] (fax)  [place]

## 2021-07-03 LAB — CBC WITH DIFFERENTIAL/PLATELET
Basophils Absolute: 0 10*3/uL (ref 0.0–0.2)
Basos: 1 %
EOS (ABSOLUTE): 0.1 10*3/uL (ref 0.0–0.4)
Eos: 1 %
Hematocrit: 37.8 % (ref 37.5–51.0)
Hemoglobin: 12.6 g/dL — ABNORMAL LOW (ref 13.0–17.7)
Immature Grans (Abs): 0 10*3/uL (ref 0.0–0.1)
Immature Granulocytes: 0 %
Lymphocytes Absolute: 1 10*3/uL (ref 0.7–3.1)
Lymphs: 12 %
MCH: 29.5 pg (ref 26.6–33.0)
MCHC: 33.3 g/dL (ref 31.5–35.7)
MCV: 89 fL (ref 79–97)
Monocytes Absolute: 1 10*3/uL — ABNORMAL HIGH (ref 0.1–0.9)
Monocytes: 12 %
Neutrophils Absolute: 6.4 10*3/uL (ref 1.4–7.0)
Neutrophils: 74 %
Platelets: 378 10*3/uL (ref 150–450)
RBC: 4.27 x10E6/uL (ref 4.14–5.80)
RDW: 13.8 % (ref 11.6–15.4)
WBC: 8.6 10*3/uL (ref 3.4–10.8)

## 2021-07-03 LAB — COMPREHENSIVE METABOLIC PANEL
ALT: 13 IU/L (ref 0–44)
AST: 15 IU/L (ref 0–40)
Albumin/Globulin Ratio: 2 (ref 1.2–2.2)
Albumin: 4 g/dL (ref 3.8–4.8)
Alkaline Phosphatase: 107 IU/L (ref 44–121)
BUN/Creatinine Ratio: 14 (ref 10–24)
BUN: 15 mg/dL (ref 8–27)
Bilirubin Total: 0.3 mg/dL (ref 0.0–1.2)
CO2: 24 mmol/L (ref 20–29)
Calcium: 8.5 mg/dL — ABNORMAL LOW (ref 8.6–10.2)
Chloride: 95 mmol/L — ABNORMAL LOW (ref 96–106)
Creatinine, Ser: 1.07 mg/dL (ref 0.76–1.27)
Globulin, Total: 2 g/dL (ref 1.5–4.5)
Glucose: 84 mg/dL (ref 70–99)
Potassium: 5.2 mmol/L (ref 3.5–5.2)
Sodium: 134 mmol/L (ref 134–144)
Total Protein: 6 g/dL (ref 6.0–8.5)
eGFR: 77 mL/min/{1.73_m2} (ref >59)

## 2021-07-03 LAB — TSH: TSH: 0.58 u[IU]/mL (ref 0.450–4.500)

## 2021-07-03 LAB — PSA TOTAL (REFLEX TO FREE): Prostate Specific Ag, Serum: 0.3 ng/mL (ref 0.0–4.0)

## 2021-07-04 ENCOUNTER — Encounter (INDEPENDENT_AMBULATORY_CARE_PROVIDER_SITE_OTHER): Payer: Self-pay | Admitting: Nurse Practitioner

## 2021-07-04 NOTE — Progress Notes (Signed)
Subjective:    Patient ID: Lucas Ramirez, male    DOB: [birth year], 66 y.o.   MRN: [id number] No chief complaint on file.   The patient is seen for follow up evaluation of carotid stenosis. The carotid stenosis followed by ultrasound.   The patient denies amaurosis fugax. There is no recent history of TIA symptoms or focal motor deficits. There is a prior documented CVA.  The patient is taking enteric-coated aspirin 81 mg daily.  There is no history of migraine headaches. There is no history of seizures.  The patient has a history of coronary artery disease, no recent episodes of angina or shortness of breath. The patient denies PAD or claudication symptoms. There is a history of hyperlipidemia which is being treated with a statin.    Carotid Duplex done today shows 1-39% of the left ICA with occlusion of the right.  No change compared to last study in 01/05/2021   Review of Systems  Eyes:  Positive for visual disturbance.  All other systems reviewed and are negative.     Objective:   Physical Exam Vitals reviewed.  HENT:     Head: Normocephalic.  Eyes:     Comments: Right eye glued closed due to treatment for corneal abrasion  Cardiovascular:     Rate and Rhythm: Normal rate.     Pulses: Normal pulses.  Pulmonary:     Effort: Pulmonary effort is normal.  Skin:    General: Skin is warm and dry.  Neurological:     Mental Status: He is alert and oriented to person, place, and time.  Psychiatric:        Mood and Affect: Mood normal.        Behavior: Behavior normal.        Thought Content: Thought content normal.        Judgment: Judgment normal.    BP 124/69 (BP Location: Right Arm)    Pulse (!) 106    Resp 16    Ht '5\' 5"'  (1.651 m)    Wt 119 lb (54 kg)    BMI 19.80 kg/m   Past Medical History:  Diagnosis Date   Bell's palsy    CAP (community acquired pneumonia) 06/28/2015   COPD (chronic obstructive pulmonary disease) (HCC)    Depression    GERD (gastroesophageal  reflux disease)    Glaucoma    Hyperlipidemia    Hypertension    Migraine    Scoliosis    Shingles 2019   dx by dermatologist by patient report    Social History   Socioeconomic History   Marital status: Married    Spouse name: Not on file   Number of children: Not on file   Years of education: Not on file   Highest education level: Not on file  Occupational History   Not on file  Tobacco Use   Smoking status: Every Day    Packs/day: 0.10    Years: 35.00    Pack years: 3.50    Types: Cigarettes   Smokeless tobacco: Never  Vaping Use   Vaping Use: Some days  Substance and Sexual Activity   Alcohol use: No   Drug use: No   Sexual activity: Yes  Other Topics Concern   Not on file  Social History Narrative   Not on file   Social Determinants of Health   Financial Resource Strain: Not on file  Food Insecurity: Not on file  Transportation Needs: Not on file  Physical  Activity: Not on file  Stress: Not on file  Social Connections: Not on file  Intimate Partner Violence: Not on file    Past Surgical History:  Procedure Laterality Date   [place]   correcting Scoliosils per pt   COLONOSCOPY WITH PROPOFOL N/A 07/04/2019   Procedure: COLONOSCOPY WITH PROPOFOL;  Surgeon: Lin Landsman, MD;  Location: [facility] ENDOSCOPY;  Service: Gastroenterology;  Laterality: N/A;  PRIORITY 4   LUMBAR LAMINECTOMY/ DECOMPRESSION WITH MET-RX Right 10/30/2019   Procedure: L4-5 DECOMPRESSION;  Surgeon: Meade Maw, MD;  Location: [facility] ORS;  Service: Neurosurgery;  Laterality: Right;   SKIN LESION EXCISION  06/06/2013   facial left cheek. Dr. Evorn Gong   UPPER GI ENDOSCOPY  07/11/2008   [place]. Dr. Donnella Sham. Impression. -LA Grade C reflux esophagitis. 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    Family History  Problem Relation Age of Onset   Emphysema Mother    Down syndrome Sister    CAD Neg Hx    Heart disease Neg Hx    Colon cancer Neg Hx     Prostate cancer Neg Hx     Allergies  Allergen Reactions   Bupropion Hcl Other (See Comments)    seizures    CBC Latest Ref Rng & Units 07/02/2021 08/31/2020 08/21/2020  WBC 3.4 - 10.8 x10E3/uL 8.6 7.5 5.7  Hemoglobin 13.0 - 17.7 g/dL 12.6(L) 12.3(L) 11.5(L)  Hematocrit 37.5 - 51.0 % 37.8 36.4(L) 33.2(L)  Platelets 150 - 450 x10E3/uL 378 332 318      CMP     Component Value Date/Time   NA 134 07/02/2021 1507   NA 140 02/01/2014 1734   K 5.2 07/02/2021 1507   K 3.6 02/01/2014 1734   CL 95 (L) 07/02/2021 1507   CL 103 02/01/2014 1734   CO2 24 07/02/2021 1507   CO2 30 02/01/2014 1734   GLUCOSE 84 07/02/2021 1507   GLUCOSE 104 (H) 08/21/2020 0519   GLUCOSE 88 02/01/2014 1734   BUN 15 07/02/2021 1507   BUN 11 02/01/2014 1734   CREATININE 1.07 07/02/2021 1507   CREATININE 1.06 02/01/2014 1734   CALCIUM 8.5 (L) 07/02/2021 1507   CALCIUM 8.4 (L) 02/01/2014 1734   PROT 6.0 07/02/2021 1507   PROT 7.5 02/01/2014 1734   ALBUMIN 4.0 07/02/2021 1507   ALBUMIN 3.5 02/01/2014 1734   AST 15 07/02/2021 1507   AST 22 02/01/2014 1734   ALT 13 07/02/2021 1507   ALT 25 02/01/2014 1734   ALKPHOS 107 07/02/2021 1507   ALKPHOS 106 02/01/2014 1734   BILITOT 0.3 07/02/2021 1507   BILITOT 0.2 02/01/2014 1734   GFRNONAA >60 08/21/2020 0519   GFRNONAA >60 02/01/2014 1734   GFRAA 45 (L) 10/24/2019 0913   GFRAA >60 02/01/2014 1734     No results found.     Assessment & Plan:   1. Bilateral carotid artery stenosis Recommend:  Given the patient's asymptomatic subcritical stenosis no further invasive testing or surgery at this time.  Duplex ultrasound shows occlusion of the right ICA with a 1 to 39% stenosis of the left ICA  Continue antiplatelet therapy as prescribed Continue management of CAD, HTN and Hyperlipidemia Healthy heart diet,  encouraged exercise at least 4 times per week Follow up in 6 months with duplex ultrasound and physical exam    2. Primary hypertension Continue  antihypertensive medications as already ordered, these medications have been reviewed and there are no changes at this time.   3. Hyperlipidemia, unspecified hyperlipidemia  type Continue statin as ordered and reviewed, no changes at this time    Current Outpatient Medications on File Prior to Visit  Medication Sig Dispense Refill   acetaminophen (TYLENOL) 500 MG tablet Take 1 tablet (500 mg total) by mouth every 6 (six) hours as needed for mild pain. 30 tablet 0   amLODipine (NORVASC) 2.5 MG tablet TAKE 1 TABLET BY MOUTH ONCE DAILY 90 tablet 4   aspirin EC 81 MG EC tablet Take 1 tablet (81 mg total) by mouth daily. Swallow whole.     Buprenorphine HCl-Naloxone HCl 4-1 MG FILM DISSOLVE 1 FILM UNDER THE TONGUE TWICE DAILY AS NEEDED 60 each 0   calcium carbonate (OSCAL) 1500 (600 Ca) MG TABS tablet Take 600 mg of elemental calcium by mouth 2 (two) times daily with a meal.     cholecalciferol (VITAMIN D) 1000 UNITS tablet Take 1,000 Units by mouth daily.     clopidogrel (PLAVIX) 75 MG tablet Take 1 tablet (75 mg total) by mouth daily. 30 tablet 11   DULoxetine (CYMBALTA) 20 MG capsule Take 20 mg by mouth 2 (two) times daily. Morning & afternoon     furosemide (LASIX) 20 MG tablet TAKE 1 TABLET BY MOUTH EVERY OTHER DAY AS NEEDED FOR EDEMA 30 tablet 3   gabapentin (NEURONTIN) 800 MG tablet TAKE 1 TABLET BY MOUTH 3 TIMES DAILY AS NEEDED AS DIRECTED 270 tablet 4   hydrochlorothiazide (HYDRODIURIL) 25 MG tablet Take 25 mg by mouth daily.     latanoprost (XALATAN) 0.005 % ophthalmic solution Place 1-2 drops into both eyes See admin instructions. Instill 1 drop into left eye at bedtime Instill 2 drops into right eye at bedtime     lisinopril (ZESTRIL) 20 MG tablet TAKE 1 TABLET BY MOUTH ONCE DAILY 90 tablet 4   omeprazole (PRILOSEC) 20 MG capsule TAKE 1 CAPSULE BY MOUTH ONCE DAILY 30 capsule 11   rosuvastatin (CRESTOR) 20 MG tablet Take 1 tablet (20 mg total) by mouth daily. 90 tablet 3   SF 5000 PLUS  1.1 % CREA dental cream Place 1 application onto teeth in the morning and at bedtime.     varenicline (CHANTIX) 1 MG tablet Take by mouth.     denosumab (PROLIA) 60 MG/ML SOSY injection Inject 60 mg into the skin every 6 (six) months.     No current facility-administered medications on file prior to visit.    There are no Patient Instructions on file for this visit. No follow-ups on file.   Kris Hartmann, NP

## 2021-07-05 ENCOUNTER — Encounter: Payer: Self-pay | Admitting: Family Medicine

## 2021-07-07 MED ORDER — HYDROCODONE-ACETAMINOPHEN 7.5-325 MG PO TABS: 1.0000 | ORAL_TABLET | Freq: Three times a day (TID) | ORAL | 0 refills | Status: DC | PRN

## 2021-07-12 ENCOUNTER — Ambulatory Visit: Payer: Self-pay

## 2021-07-12 NOTE — Telephone Encounter (Signed)
Pt called, he was wanting to know when he could start the hydrocodone since he has stopped taking the Suboxone for several days. I advised pt that hydrocodone was sent in to [facility] on 07/07/21. Pt states he was going to call pharmacy to see if it was ready. ? ?Summary: concerns about a medication  ? Pt requests to speak with a nurse regarding Rx for buprenorphine-naloxone (SUBOXONE) 8-2 mg SUBL SL tablet. Pt stated it is being stopped and he needs to talk with a nurse. Cb# [phone]   ?  ? ? ?Reason for Disposition ? Caller has medicine question only, adult not sick, AND triager answers question ? ?Answer Assessment - Initial Assessment Questions ?1. NAME of MEDICATION: "What medicine are you calling about?" ?    hydrocodone ?2. QUESTION: "What is your question?" (e.g., double dose of medicine, side effect) ?    Wanting to know when he could start that medication ?3. PRESCRIBING HCP: "Who prescribed it?" Reason: if prescribed by specialist, call should be referred to that group. ?    Dr Caryn Section ? ?Protocols used: Medication Question Call-A-AH ? ?

## 2021-08-04 ENCOUNTER — Other Ambulatory Visit (INDEPENDENT_AMBULATORY_CARE_PROVIDER_SITE_OTHER): Payer: Self-pay | Admitting: Vascular Surgery

## 2021-08-04 ENCOUNTER — Other Ambulatory Visit: Payer: Self-pay | Admitting: Family Medicine

## 2021-08-04 DIAGNOSIS — I679 Cerebrovascular disease, unspecified: Secondary | ICD-10-CM

## 2021-08-13 ENCOUNTER — Other Ambulatory Visit: Payer: Self-pay

## 2021-08-13 ENCOUNTER — Encounter: Payer: Self-pay | Admitting: Emergency Medicine

## 2021-08-13 ENCOUNTER — Inpatient Hospital Stay
Admission: EM | Admit: 2021-08-13 | Discharge: 2021-08-17 | DRG: 871 | Disposition: A | Discharge status: Home Health | Payer: Medicare PPO | Attending: Internal Medicine | Admitting: Internal Medicine

## 2021-08-13 ENCOUNTER — Ambulatory Visit: Payer: Medicare PPO | Admitting: Family Medicine

## 2021-08-13 ENCOUNTER — Emergency Department: Payer: Medicare PPO

## 2021-08-13 DIAGNOSIS — M79672 Pain in left foot: Secondary | ICD-10-CM | POA: Diagnosis present

## 2021-08-13 DIAGNOSIS — A419 Sepsis, unspecified organism: Secondary | ICD-10-CM | POA: Diagnosis not present

## 2021-08-13 DIAGNOSIS — E785 Hyperlipidemia, unspecified: Secondary | ICD-10-CM | POA: Diagnosis present

## 2021-08-13 DIAGNOSIS — M79671 Pain in right foot: Secondary | ICD-10-CM | POA: Diagnosis present

## 2021-08-13 DIAGNOSIS — Z79899 Other long term (current) drug therapy: Secondary | ICD-10-CM

## 2021-08-13 DIAGNOSIS — I160 Hypertensive urgency: Secondary | ICD-10-CM | POA: Diagnosis present

## 2021-08-13 DIAGNOSIS — Z8673 Personal history of transient ischemic attack (TIA), and cerebral infarction without residual deficits: Secondary | ICD-10-CM

## 2021-08-13 DIAGNOSIS — R531 Weakness: Principal | ICD-10-CM

## 2021-08-13 DIAGNOSIS — G43909 Migraine, unspecified, not intractable, without status migrainosus: Secondary | ICD-10-CM | POA: Diagnosis present

## 2021-08-13 DIAGNOSIS — Z7902 Long term (current) use of antithrombotics/antiplatelets: Secondary | ICD-10-CM

## 2021-08-13 DIAGNOSIS — Z681 Body mass index (BMI) 19 or less, adult: Secondary | ICD-10-CM

## 2021-08-13 DIAGNOSIS — Z8279 Family history of other congenital malformations, deformations and chromosomal abnormalities: Secondary | ICD-10-CM

## 2021-08-13 DIAGNOSIS — K219 Gastro-esophageal reflux disease without esophagitis: Secondary | ICD-10-CM | POA: Diagnosis present

## 2021-08-13 DIAGNOSIS — R0600 Dyspnea, unspecified: Secondary | ICD-10-CM

## 2021-08-13 DIAGNOSIS — E43 Unspecified severe protein-calorie malnutrition: Secondary | ICD-10-CM | POA: Diagnosis present

## 2021-08-13 DIAGNOSIS — E878 Other disorders of electrolyte and fluid balance, not elsewhere classified: Secondary | ICD-10-CM | POA: Diagnosis present

## 2021-08-13 DIAGNOSIS — E871 Hypo-osmolality and hyponatremia: Secondary | ICD-10-CM | POA: Diagnosis present

## 2021-08-13 DIAGNOSIS — H409 Unspecified glaucoma: Secondary | ICD-10-CM | POA: Diagnosis present

## 2021-08-13 DIAGNOSIS — Z7982 Long term (current) use of aspirin: Secondary | ICD-10-CM

## 2021-08-13 DIAGNOSIS — J189 Pneumonia, unspecified organism: Secondary | ICD-10-CM | POA: Diagnosis present

## 2021-08-13 DIAGNOSIS — I679 Cerebrovascular disease, unspecified: Secondary | ICD-10-CM | POA: Diagnosis present

## 2021-08-13 DIAGNOSIS — F17210 Nicotine dependence, cigarettes, uncomplicated: Secondary | ICD-10-CM | POA: Diagnosis present

## 2021-08-13 DIAGNOSIS — I10 Essential (primary) hypertension: Secondary | ICD-10-CM | POA: Diagnosis present

## 2021-08-13 DIAGNOSIS — Z888 Allergy status to other drugs, medicaments and biological substances status: Secondary | ICD-10-CM

## 2021-08-13 DIAGNOSIS — M419 Scoliosis, unspecified: Secondary | ICD-10-CM | POA: Diagnosis present

## 2021-08-13 DIAGNOSIS — Z825 Family history of asthma and other chronic lower respiratory diseases: Secondary | ICD-10-CM

## 2021-08-13 DIAGNOSIS — J441 Chronic obstructive pulmonary disease with (acute) exacerbation: Secondary | ICD-10-CM

## 2021-08-13 MED ORDER — IPRATROPIUM-ALBUTEROL 0.5-2.5 (3) MG/3ML IN SOLN
3.0000 mL | Freq: Once | RESPIRATORY_TRACT | Status: AC
  Administered 2021-08-14: 3 mL via RESPIRATORY_TRACT
  Filled 2021-08-13: qty 3

## 2021-08-13 MED ORDER — OXYCODONE-ACETAMINOPHEN 5-325 MG PO TABS
1.0000 | ORAL_TABLET | Freq: Once | ORAL | Status: AC
  Administered 2021-08-14: 1 via ORAL
  Filled 2021-08-13: qty 1

## 2021-08-13 MED ORDER — METHYLPREDNISOLONE SODIUM SUCC 125 MG IJ SOLR
125.0000 mg | Freq: Once | INTRAMUSCULAR | Status: AC
  Administered 2021-08-14: 125 mg via INTRAVENOUS
  Filled 2021-08-13: qty 2

## 2021-08-13 NOTE — ED Provider Notes (Signed)
? ?[facility] ?Provider Note ? ? ? Event Date/Time  ? First MD Initiated Contact with Patient 08/13/21 2342   ?  (approximate) ? ? ?History  ? ?Shortness of Breath and Weakness ? ? ?HPI ? ?Lucas Ramirez is a 65 y.o. male brought to the ED via EMS from home with a chief complaint of shortness of breath and generalized weakness.  Patient with a history of COPD not on home oxygen, hypertension, history of stroke, blindness who reports symptoms starting this morning.  States he takes Lasix but has noticed increased leg swelling bilaterally.  Arrives to the ED on 3 L nasal cannula oxygen.  Denies fever, chest pain, abdominal pain, nausea, vomiting or dizziness. ?  ? ? ?Past Medical History  ? ?Past Medical History:  ?Diagnosis Date  ? Bell's palsy   ? CAP (community acquired pneumonia) 06/28/2015  ? COPD (chronic obstructive pulmonary disease) (HCC)   ? Depression   ? GERD (gastroesophageal reflux disease)   ? Glaucoma   ? Hyperlipidemia   ? Hypertension   ? Migraine   ? Scoliosis   ? Shingles 2019  ? dx by dermatologist by patient report  ? ? ? ?Active Problem List  ? ?Patient Active Problem List  ? Diagnosis Date Noted  ? Carotid stenosis 09/29/2020  ? Cerebrovascular disease 08/31/2020  ? History of CVA with residual deficit 08/31/2020  ? Edema 08/31/2020  ? Stroke (cerebrum) (HCC) 08/21/2020  ? Visual loss 08/19/2020  ? History of adenomatous polyp of colon 07/05/2019  ? Lymphadenopathy 03/31/2019  ? Vitamin B6 deficiency 11/28/2018  ? COPD exacerbation (HCC) 06/28/2015  ? HTN (hypertension) 06/28/2015  ? Back pain, chronic 12/24/2014  ? Duodenitis 12/24/2014  ? Esophagitis 12/24/2014  ? H/O surgical procedure 12/24/2014  ? HLD (hyperlipidemia) 12/24/2014  ? Headache, migraine 12/24/2014  ? Neuropathy 12/24/2014  ? Borderline diabetes 12/24/2014  ? Scoliosis 12/24/2014  ? Compulsive tobacco user syndrome 12/24/2014  ? Vitamin D deficiency 12/24/2014  ? Bell's palsy 12/24/2014  ? Polyneuropathy  12/24/2014  ? ? ? ?Past Surgical History  ? ?Past Surgical History:  ?Procedure Laterality Date  ? BACK SURGERY  1963  ? correcting Scoliosils per pt  ? COLONOSCOPY WITH PROPOFOL N/A 07/04/2019  ? Procedure: COLONOSCOPY WITH PROPOFOL;  Surgeon: Vanga, Rohini Reddy, MD;  Location: [facility] ENDOSCOPY;  Service: Gastroenterology;  Laterality: N/A;  PRIORITY 4  ? LUMBAR LAMINECTOMY/ DECOMPRESSION WITH MET-RX Right 10/30/2019  ? Procedure: L4-5 DECOMPRESSION;  Surgeon: Yarbrough, Chester, MD;  Location: [facility] ORS;  Service: Neurosurgery;  Laterality: Right;  ? SKIN LESION EXCISION  06/06/2013  ? facial left cheek. Dr. Dasher  ? UPPER GI ENDOSCOPY  07/11/2008  ? [facility]. Dr. Skulski. Impression. -LA Grade C reflux esophagitis. 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  ? ? ? ?Home Medications  ? ?Prior to Admission medications   ?Medication Sig Start Date End Date Taking? Authorizing Provider  ?acetaminophen (TYLENOL) 500 MG tablet Take 1 tablet (500 mg total) by mouth every 6 (six) hours as needed for mild pain. 10/30/19   Zdeb, Christine, NP  ?amLODipine (NORVASC) 2.5 MG tablet TAKE 1 TABLET BY MOUTH ONCE DAILY 02/15/21   Fisher, Donald E, MD  ?aspirin EC 81 MG EC tablet Take 1 tablet (81 mg total) by mouth daily. Swallow whole. 08/21/20   Lai, Tina, MD  ?calcium carbonate (OSCAL) 1500 (600 Ca) MG TABS tablet Take 600 mg of elemental calcium by mouth 2 (two) times daily with a meal.      [provider]  ?cholecalciferol (VITAMIN D) 1000 UNITS tablet Take 1,000 Units by mouth daily.    [provider]  ?clopidogrel (PLAVIX) 75 MG tablet TAKE 1 TABLET BY MOUTH ONCE DAILY 08/04/21   Brown, Fallon E, NP  ?denosumab (PROLIA) 60 MG/ML SOSY injection Inject 60 mg into the skin every 6 (six) months.    [provider]  ?DULoxetine (CYMBALTA) 20 MG capsule Take 20 mg by mouth 2 (two) times daily. Morning & afternoon    Shah, Hemang K, MD  ?furosemide (LASIX) 20 MG tablet TAKE 1 TABLET BY MOUTH  EVERY OTHER DAY AS NEEDED FOR EDEMA 04/04/21   Fisher, Donald E, MD  ?gabapentin (NEURONTIN) 800 MG tablet TAKE 1 TABLET BY MOUTH 3 TIMES DAILY AS NEEDED AS DIRECTED 12/29/20   Fisher, Donald E, MD  ?hydrochlorothiazide (HYDRODIURIL) 25 MG tablet TAKE 1 TABLET BY MOUTH ONCE DAILY 08/04/21   Fisher, Donald E, MD  ?HYDROcodone-acetaminophen (NORCO) 7.5-325 MG tablet TAKE 1 TABLET BY MOUTH EVERY 8 HOURS AS NEEDED FOR MODERATE PAIN 08/04/21   Fisher, Donald E, MD  ?latanoprost (XALATAN) 0.005 % ophthalmic solution Place 1-2 drops into both eyes See admin instructions. Instill 1 drop into left eye at bedtime ?Instill 2 drops into right eye at bedtime    [provider]  ?lisinopril (ZESTRIL) 20 MG tablet TAKE 1 TABLET BY MOUTH ONCE DAILY 02/15/21   Fisher, Donald E, MD  ?omeprazole (PRILOSEC) 20 MG capsule TAKE 1 CAPSULE BY MOUTH ONCE DAILY 08/04/21   Fisher, Donald E, MD  ?rosuvastatin (CRESTOR) 20 MG tablet TAKE 1 TABLET BY MOUTH AT BEDTIME 08/04/21   Fisher, Donald E, MD  ?SF 5000 PLUS 1.1 % CREA dental cream Place 1 application onto teeth in the morning and at bedtime. 09/24/19   [provider]  ?varenicline (CHANTIX) 1 MG tablet Take by mouth. 08/31/20   [provider]  ? ? ? ?Allergies  ?Bupropion hcl ? ? ?Family History  ? ?Family History  ?Problem Relation Age of Onset  ? Emphysema Mother   ? Down syndrome Sister   ? CAD Neg Hx   ? Heart disease Neg Hx   ? Colon cancer Neg Hx   ? Prostate cancer Neg Hx   ? ? ? ?Physical Exam  ?Triage Vital Signs: ?ED Triage Vitals  ?Enc Vitals Group  ?   BP 08/13/21 2247 (!) 178/101  ?   Pulse Rate 08/13/21 2247 (!) 108  ?   Resp 08/13/21 2247 (!) 23  ?   Temp 08/13/21 2247 98.3 ?F (36.8 ?C)  ?   Temp Source 08/13/21 2247 Oral  ?   SpO2 08/13/21 2247 100 %  ?   Weight 08/13/21 2248 120 lb (54.4 kg)  ?   Height 08/13/21 2248 5' 6" (1.676 m)  ?   Head Circumference --   ?   Peak Flow --   ?   Pain Score 08/13/21 2248 6  ?   Pain Loc --   ?   Pain Edu? --   ?    Excl. in GC? --   ? ? ?Updated Vital Signs: ?BP (!) 178/89   Pulse (!) 107   Temp 98.3 ?F (36.8 ?C) (Oral)   Resp (!) 22   Ht 5' 6" (1.676 m)   Wt 54.4 kg   SpO2 100%   BMI 19.37 kg/m?  ? ? ?General: Awake, mild distress.  ?CV:  Tachycardic.  Good peripheral perfusion.  ?Resp:  Increased   effort.  Diminished bilaterally with wheezing and rales. ?Abd:  Nontender.  No distention.  ?Other:  Right eye blind; postoperative suture in place.  BLE 1+ pitting edema. ? ? ?ED Results / Procedures / Treatments  ?Labs ?(all labs ordered are listed, but only abnormal results are displayed) ?Labs Reviewed  ?CBC WITH DIFFERENTIAL/PLATELET - Abnormal; Notable for the following components:  ?    Result Value  ? WBC 13.3 (*)   ? HCT 38.4 (*)   ? RDW 15.6 (*)   ? Platelets 423 (*)   ? Neutro Abs 10.9 (*)   ? Monocytes Absolute 1.3 (*)   ? Abs Immature Granulocytes 0.08 (*)   ? All other components within normal limits  ?COMPREHENSIVE METABOLIC PANEL - Abnormal; Notable for the following components:  ? Sodium 131 (*)   ? Chloride 96 (*)   ? Glucose, Bld 130 (*)   ? Calcium 7.6 (*)   ? Total Protein 6.4 (*)   ? Albumin 3.1 (*)   ? All other components within normal limits  ?TROPONIN I (HIGH SENSITIVITY) - Abnormal; Notable for the following components:  ? Troponin I (High Sensitivity) 23 (*)   ? All other components within normal limits  ?CULTURE, BLOOD (ROUTINE X 2)  ?CULTURE, BLOOD (ROUTINE X 2)  ?BRAIN NATRIURETIC PEPTIDE  ?LACTIC ACID, PLASMA  ?LACTIC ACID, PLASMA  ? ? ? ?EKG ? ?ED ECG REPORT ?I, Paulette Blanch, the attending physician, personally viewed and interpreted this ECG. ? ? Date: 08/14/2021 ? EKG Time: 0053 ? Rate: 107 ? Rhythm: sinus tachycardia ? Axis: Normal ? Intervals:none ? ST&T Change: Nonspecific ? ? ? ?RADIOLOGY ?I have independently visualized and interpreted patient's chest x-ray as well as noted the radiology interpretation: ? ?Chest x-ray: Atelectasis versus pneumonia ? ?Official radiology report(s): ?DG  Chest Port 1 View ? ?Result Date: 08/14/2021 ?CLINICAL DATA:  Shortness of breath. EXAM: PORTABLE CHEST 1 VIEW COMPARISON:  CT chest with contrast 04/16/2019, PA and lateral 06/29/2015. FINDINGS: There is chronic p

## 2021-08-13 NOTE — ED Triage Notes (Addendum)
Pt to ED via [facility] from home for SOB and weakness that started this AM and progressively gotten worse. Denies CP.  ?Pt has hx of COPD and HTN. Pt states he takes lasix, unknown last dose, possibly yesterday.  ?Pt has edema present bilateral lower extremities  ? ?Pt currently 100% on 3L [place].  ?Pt is A&OX4.  ? ?

## 2021-08-13 NOTE — Progress Notes (Deleted)
      Established patient visit   Patient: Lucas Ramirez   DOB: 04-28-1955   66 y.o. Male  MRN: [id number] Visit Date: 08/13/2021  Today's healthcare provider: Lelon Huh, MD   No chief complaint on file.  Subjective    HPI  Follow up for abnormal weight loss and chronic low back pain:  The patient was last seen for this 4 weeks ago. Changes made at last visit include changing from Buprenorphine HCl-Naloxone HCl to Hydrocodone-Acetaminophen.  He reports {excellent/good/fair/poor:19665} compliance with treatment. He feels that condition is {improved/worse/unchanged:3041574}. He {is/is not:21021397} having side effects. ***  -----------------------------------------------------------------------------------------   Medications: Outpatient Medications Prior to Visit  Medication Sig   acetaminophen (TYLENOL) 500 MG tablet Take 1 tablet (500 mg total) by mouth every 6 (six) hours as needed for mild pain.   amLODipine (NORVASC) 2.5 MG tablet TAKE 1 TABLET BY MOUTH ONCE DAILY   aspirin EC 81 MG EC tablet Take 1 tablet (81 mg total) by mouth daily. Swallow whole.   calcium carbonate (OSCAL) 1500 (600 Ca) MG TABS tablet Take 600 mg of elemental calcium by mouth 2 (two) times daily with a meal.   cholecalciferol (VITAMIN D) 1000 UNITS tablet Take 1,000 Units by mouth daily.   clopidogrel (PLAVIX) 75 MG tablet TAKE 1 TABLET BY MOUTH ONCE DAILY   denosumab (PROLIA) 60 MG/ML SOSY injection Inject 60 mg into the skin every 6 (six) months.   DULoxetine (CYMBALTA) 20 MG capsule Take 20 mg by mouth 2 (two) times daily. Morning & afternoon   furosemide (LASIX) 20 MG tablet TAKE 1 TABLET BY MOUTH EVERY OTHER DAY AS NEEDED FOR EDEMA   gabapentin (NEURONTIN) 800 MG tablet TAKE 1 TABLET BY MOUTH 3 TIMES DAILY AS NEEDED AS DIRECTED   hydrochlorothiazide (HYDRODIURIL) 25 MG tablet TAKE 1 TABLET BY MOUTH ONCE DAILY   HYDROcodone-acetaminophen (NORCO) 7.5-325 MG tablet TAKE 1 TABLET BY MOUTH EVERY 8  HOURS AS NEEDED FOR MODERATE PAIN   latanoprost (XALATAN) 0.005 % ophthalmic solution Place 1-2 drops into both eyes See admin instructions. Instill 1 drop into left eye at bedtime Instill 2 drops into right eye at bedtime   lisinopril (ZESTRIL) 20 MG tablet TAKE 1 TABLET BY MOUTH ONCE DAILY   omeprazole (PRILOSEC) 20 MG capsule TAKE 1 CAPSULE BY MOUTH ONCE DAILY   rosuvastatin (CRESTOR) 20 MG tablet TAKE 1 TABLET BY MOUTH AT BEDTIME   SF 5000 PLUS 1.1 % CREA dental cream Place 1 application onto teeth in the morning and at bedtime.   varenicline (CHANTIX) 1 MG tablet Take by mouth.   No facility-administered medications prior to visit.    Review of Systems  {Labs  Heme  Chem  Endocrine  Serology  Results Review (optional):23779}   Objective    There were no vitals taken for this visit. {Show previous vital signs (optional):23777}  Physical Exam  ***  No results found for any visits on 08/13/21.  Assessment & Plan     ***  No follow-ups on file.      {provider attestation***:1}   Lelon Huh, MD  [facility] [phone] (phone) [phone] (fax)  [place]

## 2021-08-14 ENCOUNTER — Other Ambulatory Visit: Payer: Self-pay

## 2021-08-14 ENCOUNTER — Encounter: Payer: Self-pay | Admitting: Family Medicine

## 2021-08-14 ENCOUNTER — Emergency Department: Payer: Medicare PPO

## 2021-08-14 DIAGNOSIS — Z825 Family history of asthma and other chronic lower respiratory diseases: Secondary | ICD-10-CM | POA: Diagnosis not present

## 2021-08-14 DIAGNOSIS — J441 Chronic obstructive pulmonary disease with (acute) exacerbation: Secondary | ICD-10-CM

## 2021-08-14 DIAGNOSIS — H409 Unspecified glaucoma: Secondary | ICD-10-CM | POA: Diagnosis present

## 2021-08-14 DIAGNOSIS — E871 Hypo-osmolality and hyponatremia: Secondary | ICD-10-CM | POA: Diagnosis present

## 2021-08-14 DIAGNOSIS — K219 Gastro-esophageal reflux disease without esophagitis: Secondary | ICD-10-CM | POA: Diagnosis present

## 2021-08-14 DIAGNOSIS — A419 Sepsis, unspecified organism: Secondary | ICD-10-CM | POA: Diagnosis present

## 2021-08-14 DIAGNOSIS — Z7982 Long term (current) use of aspirin: Secondary | ICD-10-CM | POA: Diagnosis not present

## 2021-08-14 DIAGNOSIS — M419 Scoliosis, unspecified: Secondary | ICD-10-CM | POA: Diagnosis present

## 2021-08-14 DIAGNOSIS — Z7902 Long term (current) use of antithrombotics/antiplatelets: Secondary | ICD-10-CM | POA: Diagnosis not present

## 2021-08-14 DIAGNOSIS — E878 Other disorders of electrolyte and fluid balance, not elsewhere classified: Secondary | ICD-10-CM | POA: Diagnosis present

## 2021-08-14 DIAGNOSIS — G43909 Migraine, unspecified, not intractable, without status migrainosus: Secondary | ICD-10-CM | POA: Diagnosis present

## 2021-08-14 DIAGNOSIS — E43 Unspecified severe protein-calorie malnutrition: Secondary | ICD-10-CM | POA: Diagnosis present

## 2021-08-14 DIAGNOSIS — J189 Pneumonia, unspecified organism: Secondary | ICD-10-CM | POA: Diagnosis present

## 2021-08-14 DIAGNOSIS — I679 Cerebrovascular disease, unspecified: Secondary | ICD-10-CM | POA: Diagnosis present

## 2021-08-14 DIAGNOSIS — Z79899 Other long term (current) drug therapy: Secondary | ICD-10-CM | POA: Diagnosis not present

## 2021-08-14 DIAGNOSIS — I160 Hypertensive urgency: Secondary | ICD-10-CM

## 2021-08-14 DIAGNOSIS — Z8673 Personal history of transient ischemic attack (TIA), and cerebral infarction without residual deficits: Secondary | ICD-10-CM

## 2021-08-14 DIAGNOSIS — F17210 Nicotine dependence, cigarettes, uncomplicated: Secondary | ICD-10-CM | POA: Diagnosis present

## 2021-08-14 DIAGNOSIS — Z8279 Family history of other congenital malformations, deformations and chromosomal abnormalities: Secondary | ICD-10-CM | POA: Diagnosis not present

## 2021-08-14 DIAGNOSIS — E785 Hyperlipidemia, unspecified: Secondary | ICD-10-CM | POA: Diagnosis present

## 2021-08-14 DIAGNOSIS — Z681 Body mass index (BMI) 19 or less, adult: Secondary | ICD-10-CM | POA: Diagnosis not present

## 2021-08-14 DIAGNOSIS — M79672 Pain in left foot: Secondary | ICD-10-CM | POA: Diagnosis present

## 2021-08-14 DIAGNOSIS — Z888 Allergy status to other drugs, medicaments and biological substances status: Secondary | ICD-10-CM | POA: Diagnosis not present

## 2021-08-14 DIAGNOSIS — I10 Essential (primary) hypertension: Secondary | ICD-10-CM | POA: Diagnosis present

## 2021-08-14 DIAGNOSIS — M79671 Pain in right foot: Secondary | ICD-10-CM | POA: Diagnosis present

## 2021-08-14 LAB — CBC WITH DIFFERENTIAL/PLATELET
Abs Immature Granulocytes: 0.08 10*3/uL — ABNORMAL HIGH (ref 0.00–0.07)
Basophils Absolute: 0 10*3/uL (ref 0.0–0.1)
Basophils Relative: 0 %
Eosinophils Absolute: 0 10*3/uL (ref 0.0–0.5)
Eosinophils Relative: 0 %
HCT: 38.4 % — ABNORMAL LOW (ref 39.0–52.0)
Hemoglobin: 13 g/dL (ref 13.0–17.0)
Immature Granulocytes: 1 %
Lymphocytes Relative: 8 %
Lymphs Abs: 1 10*3/uL (ref 0.7–4.0)
MCH: 29.5 pg (ref 26.0–34.0)
MCHC: 33.9 g/dL (ref 30.0–36.0)
MCV: 87.1 fL (ref 80.0–100.0)
Monocytes Absolute: 1.3 10*3/uL — ABNORMAL HIGH (ref 0.1–1.0)
Monocytes Relative: 10 %
Neutro Abs: 10.9 10*3/uL — ABNORMAL HIGH (ref 1.7–7.7)
Neutrophils Relative %: 81 %
Platelets: 423 10*3/uL — ABNORMAL HIGH (ref 150–400)
RBC: 4.41 MIL/uL (ref 4.22–5.81)
RDW: 15.6 % — ABNORMAL HIGH (ref 11.5–15.5)
WBC: 13.3 10*3/uL — ABNORMAL HIGH (ref 4.0–10.5)
nRBC: 0 % (ref 0.0–0.2)

## 2021-08-14 LAB — CBC
HCT: 34.9 % — ABNORMAL LOW (ref 39.0–52.0)
Hemoglobin: 12 g/dL — ABNORMAL LOW (ref 13.0–17.0)
MCH: 29.5 pg (ref 26.0–34.0)
MCHC: 34.4 g/dL (ref 30.0–36.0)
MCV: 85.7 fL (ref 80.0–100.0)
Platelets: 400 10*3/uL (ref 150–400)
RBC: 4.07 MIL/uL — ABNORMAL LOW (ref 4.22–5.81)
RDW: 15.2 % (ref 11.5–15.5)
WBC: 10.2 10*3/uL (ref 4.0–10.5)
nRBC: 0 % (ref 0.0–0.2)

## 2021-08-14 LAB — TROPONIN I (HIGH SENSITIVITY)
Troponin I (High Sensitivity): 23 ng/L — ABNORMAL HIGH (ref <18)
Troponin I (High Sensitivity): 23 ng/L — ABNORMAL HIGH (ref <18)

## 2021-08-14 LAB — PROTIME-INR
INR: 1 (ref 0.8–1.2)
Prothrombin Time: 13.5 seconds (ref 11.4–15.2)

## 2021-08-14 LAB — COMPREHENSIVE METABOLIC PANEL
ALT: 18 U/L (ref 0–44)
AST: 28 U/L (ref 15–41)
Albumin: 3.1 g/dL — ABNORMAL LOW (ref 3.5–5.0)
Alkaline Phosphatase: 80 U/L (ref 38–126)
Anion gap: 10 (ref 5–15)
BUN: 20 mg/dL (ref 8–23)
CO2: 25 mmol/L (ref 22–32)
Calcium: 7.6 mg/dL — ABNORMAL LOW (ref 8.9–10.3)
Chloride: 96 mmol/L — ABNORMAL LOW (ref 98–111)
Creatinine, Ser: 1.02 mg/dL (ref 0.61–1.24)
GFR, Estimated: >60 mL/min (ref >60)
Glucose, Bld: 130 mg/dL — ABNORMAL HIGH (ref 70–99)
Potassium: 4.1 mmol/L (ref 3.5–5.1)
Sodium: 131 mmol/L — ABNORMAL LOW (ref 135–145)
Total Bilirubin: 0.6 mg/dL (ref 0.3–1.2)
Total Protein: 6.4 g/dL — ABNORMAL LOW (ref 6.5–8.1)

## 2021-08-14 LAB — BRAIN NATRIURETIC PEPTIDE: B Natriuretic Peptide: 79.3 pg/mL (ref 0.0–100.0)

## 2021-08-14 LAB — LACTIC ACID, PLASMA: Lactic Acid, Venous: 1.2 mmol/L (ref 0.5–1.9)

## 2021-08-14 LAB — BASIC METABOLIC PANEL
Anion gap: 11 (ref 5–15)
BUN: 19 mg/dL (ref 8–23)
CO2: 23 mmol/L (ref 22–32)
Calcium: 7.2 mg/dL — ABNORMAL LOW (ref 8.9–10.3)
Chloride: 97 mmol/L — ABNORMAL LOW (ref 98–111)
Creatinine, Ser: 0.92 mg/dL (ref 0.61–1.24)
GFR, Estimated: >60 mL/min (ref >60)
Glucose, Bld: 158 mg/dL — ABNORMAL HIGH (ref 70–99)
Potassium: 3.7 mmol/L (ref 3.5–5.1)
Sodium: 131 mmol/L — ABNORMAL LOW (ref 135–145)

## 2021-08-14 LAB — D-DIMER, QUANTITATIVE: D-Dimer, Quant: 2.49 ug/mL-FEU — ABNORMAL HIGH (ref 0.00–0.50)

## 2021-08-14 LAB — CORTISOL-AM, BLOOD: Cortisol - AM: 16.1 ug/dL (ref 6.7–22.6)

## 2021-08-14 LAB — PROCALCITONIN: Procalcitonin: 0.21 ng/mL

## 2021-08-14 LAB — HIV ANTIBODY (ROUTINE TESTING W REFLEX): HIV Screen 4th Generation wRfx: NONREACTIVE

## 2021-08-14 MED ORDER — SODIUM CHLORIDE 0.9 % IV SOLN: INTRAVENOUS | Status: DC | PRN

## 2021-08-14 MED ORDER — IPRATROPIUM-ALBUTEROL 0.5-2.5 (3) MG/3ML IN SOLN
3.0000 mL | Freq: Four times a day (QID) | RESPIRATORY_TRACT | Status: DC
  Administered 2021-08-14 (×3): 3 mL via RESPIRATORY_TRACT
  Filled 2021-08-14 (×3): qty 3

## 2021-08-14 MED ORDER — AMLODIPINE BESYLATE 5 MG PO TABS
2.5000 mg | ORAL_TABLET | Freq: Every day | ORAL | Status: DC
  Administered 2021-08-14 – 2021-08-17 (×4): 2.5 mg via ORAL
  Filled 2021-08-14 (×4): qty 1

## 2021-08-14 MED ORDER — OXYCODONE HCL 5 MG PO TABS
5.0000 mg | ORAL_TABLET | ORAL | Status: DC | PRN
  Administered 2021-08-15 – 2021-08-17 (×7): 5 mg via ORAL
  Filled 2021-08-14 (×7): qty 1

## 2021-08-14 MED ORDER — TRAZODONE HCL 50 MG PO TABS
25.0000 mg | ORAL_TABLET | Freq: Every evening | ORAL | Status: DC | PRN
  Administered 2021-08-14 – 2021-08-15 (×2): 25 mg via ORAL
  Filled 2021-08-14 (×2): qty 1

## 2021-08-14 MED ORDER — SODIUM CHLORIDE 0.9 % IV SOLN
1.0000 g | Freq: Once | INTRAVENOUS | Status: AC
  Administered 2021-08-14: 1 g via INTRAVENOUS
  Filled 2021-08-14: qty 10

## 2021-08-14 MED ORDER — VITAMIN D 25 MCG (1000 UNIT) PO TABS
1000.0000 [IU] | ORAL_TABLET | Freq: Every day | ORAL | Status: DC
  Administered 2021-08-14 – 2021-08-17 (×4): 1000 [IU] via ORAL
  Filled 2021-08-14 (×4): qty 1

## 2021-08-14 MED ORDER — POTASSIUM CHLORIDE 20 MEQ PO PACK
40.0000 meq | PACK | Freq: Once | ORAL | Status: AC
  Administered 2021-08-14: 40 meq via ORAL
  Filled 2021-08-14: qty 2

## 2021-08-14 MED ORDER — ACETAMINOPHEN 325 MG PO TABS
650.0000 mg | ORAL_TABLET | Freq: Four times a day (QID) | ORAL | Status: DC | PRN
Start: 2021-08-14 — End: 2021-08-17

## 2021-08-14 MED ORDER — ARFORMOTEROL TARTRATE 15 MCG/2ML IN NEBU
15.0000 ug | INHALATION_SOLUTION | Freq: Two times a day (BID) | RESPIRATORY_TRACT | Status: DC
  Administered 2021-08-14 – 2021-08-17 (×7): 15 ug via RESPIRATORY_TRACT
  Filled 2021-08-14 (×8): qty 2

## 2021-08-14 MED ORDER — FUROSEMIDE 10 MG/ML IJ SOLN
20.0000 mg | Freq: Once | INTRAMUSCULAR | Status: AC
  Administered 2021-08-14: 20 mg via INTRAVENOUS
  Filled 2021-08-14: qty 4

## 2021-08-14 MED ORDER — SODIUM CHLORIDE 0.9 % IV SOLN
2.0000 g | INTRAVENOUS | Status: DC
  Administered 2021-08-14 – 2021-08-15 (×2): 2 g via INTRAVENOUS
  Filled 2021-08-14: qty 20
  Filled 2021-08-14: qty 2
  Filled 2021-08-14: qty 20

## 2021-08-14 MED ORDER — ONDANSETRON HCL 4 MG PO TABS: 4.0000 mg | ORAL_TABLET | Freq: Four times a day (QID) | ORAL | Status: DC | PRN

## 2021-08-14 MED ORDER — MAGNESIUM HYDROXIDE 400 MG/5ML PO SUSP: 30.0000 mL | Freq: Every day | ORAL | Status: DC | PRN

## 2021-08-14 MED ORDER — LATANOPROST 0.005 % OP SOLN
1.0000 [drp] | Freq: Every day | OPHTHALMIC | Status: DC
  Administered 2021-08-14 – 2021-08-16 (×3): 1 [drp] via OPHTHALMIC
  Filled 2021-08-14: qty 2.5

## 2021-08-14 MED ORDER — LISINOPRIL 20 MG PO TABS
20.0000 mg | ORAL_TABLET | Freq: Every day | ORAL | Status: DC
  Administered 2021-08-14 – 2021-08-17 (×4): 20 mg via ORAL
  Filled 2021-08-14 (×4): qty 1

## 2021-08-14 MED ORDER — ACETAMINOPHEN 650 MG RE SUPP: 650.0000 mg | Freq: Four times a day (QID) | RECTAL | Status: DC | PRN

## 2021-08-14 MED ORDER — SODIUM CHLORIDE 0.9 % IV SOLN
500.0000 mg | Freq: Once | INTRAVENOUS | Status: AC
  Administered 2021-08-14: 500 mg via INTRAVENOUS
  Filled 2021-08-14: qty 5

## 2021-08-14 MED ORDER — ROSUVASTATIN CALCIUM 10 MG PO TABS
20.0000 mg | ORAL_TABLET | Freq: Every day | ORAL | Status: DC
  Administered 2021-08-14 – 2021-08-16 (×3): 20 mg via ORAL
  Filled 2021-08-14 (×3): qty 2

## 2021-08-14 MED ORDER — HYDROCODONE-ACETAMINOPHEN 5-325 MG PO TABS
1.5000 | ORAL_TABLET | ORAL | Status: DC | PRN
  Administered 2021-08-14: 1.5 via ORAL
  Filled 2021-08-14: qty 2

## 2021-08-14 MED ORDER — ENSURE ENLIVE PO LIQD
237.0000 mL | Freq: Three times a day (TID) | ORAL | Status: DC
  Administered 2021-08-14 – 2021-08-16 (×7): 237 mL via ORAL

## 2021-08-14 MED ORDER — ADULT MULTIVITAMIN W/MINERALS CH
1.0000 | ORAL_TABLET | Freq: Every day | ORAL | Status: DC
  Administered 2021-08-15 – 2021-08-16 (×2): 1 via ORAL
  Filled 2021-08-14 (×2): qty 1

## 2021-08-14 MED ORDER — METHYLPREDNISOLONE SODIUM SUCC 40 MG IJ SOLR
40.0000 mg | Freq: Two times a day (BID) | INTRAMUSCULAR | Status: DC
  Administered 2021-08-14 – 2021-08-15 (×4): 40 mg via INTRAVENOUS
  Filled 2021-08-14 (×4): qty 1

## 2021-08-14 MED ORDER — CLOPIDOGREL BISULFATE 75 MG PO TABS
75.0000 mg | ORAL_TABLET | Freq: Every day | ORAL | Status: DC
  Administered 2021-08-14 – 2021-08-17 (×4): 75 mg via ORAL
  Filled 2021-08-14 (×4): qty 1

## 2021-08-14 MED ORDER — LABETALOL HCL 5 MG/ML IV SOLN: 20.0000 mg | INTRAVENOUS | Status: DC | PRN

## 2021-08-14 MED ORDER — IPRATROPIUM-ALBUTEROL 0.5-2.5 (3) MG/3ML IN SOLN
3.0000 mL | Freq: Once | RESPIRATORY_TRACT | Status: AC
  Administered 2021-08-14: 3 mL via RESPIRATORY_TRACT

## 2021-08-14 MED ORDER — BUDESONIDE 0.25 MG/2ML IN SUSP
0.2500 mg | Freq: Two times a day (BID) | RESPIRATORY_TRACT | Status: DC
  Administered 2021-08-14 – 2021-08-17 (×7): 0.25 mg via RESPIRATORY_TRACT
  Filled 2021-08-14 (×7): qty 2

## 2021-08-14 MED ORDER — POTASSIUM CHLORIDE IN NACL 20-0.9 MEQ/L-% IV SOLN
INTRAVENOUS | Status: DC
  Filled 2021-08-14 (×2): qty 1000

## 2021-08-14 MED ORDER — GABAPENTIN 400 MG PO CAPS
800.0000 mg | ORAL_CAPSULE | Freq: Three times a day (TID) | ORAL | Status: DC
  Administered 2021-08-14 – 2021-08-17 (×10): 800 mg via ORAL
  Filled 2021-08-14 (×10): qty 2

## 2021-08-14 MED ORDER — PANTOPRAZOLE SODIUM 40 MG PO TBEC
40.0000 mg | DELAYED_RELEASE_TABLET | Freq: Every day | ORAL | Status: DC
  Administered 2021-08-14 – 2021-08-17 (×4): 40 mg via ORAL
  Filled 2021-08-14 (×4): qty 1

## 2021-08-14 MED ORDER — ASPIRIN EC 81 MG PO TBEC
81.0000 mg | DELAYED_RELEASE_TABLET | Freq: Every day | ORAL | Status: DC
  Administered 2021-08-14 – 2021-08-17 (×4): 81 mg via ORAL
  Filled 2021-08-14 (×4): qty 1

## 2021-08-14 MED ORDER — MORPHINE SULFATE (PF) 2 MG/ML IV SOLN
2.0000 mg | INTRAVENOUS | Status: DC | PRN
  Administered 2021-08-14 – 2021-08-16 (×10): 2 mg via INTRAVENOUS
  Filled 2021-08-14 (×10): qty 1

## 2021-08-14 MED ORDER — SODIUM FLUORIDE 1.1 % DT CREA: 1.0000 "application " | TOPICAL_CREAM | Freq: Every day | DENTAL | Status: DC

## 2021-08-14 MED ORDER — CALCIUM CARBONATE 1250 (500 CA) MG PO TABS
500.0000 mg | ORAL_TABLET | Freq: Two times a day (BID) | ORAL | Status: DC
  Administered 2021-08-14 – 2021-08-17 (×7): 500 mg via ORAL
  Filled 2021-08-14 (×7): qty 1

## 2021-08-14 MED ORDER — DULOXETINE HCL 20 MG PO CPEP
20.0000 mg | ORAL_CAPSULE | Freq: Two times a day (BID) | ORAL | Status: DC
  Administered 2021-08-14 – 2021-08-17 (×7): 20 mg via ORAL
  Filled 2021-08-14 (×8): qty 1

## 2021-08-14 MED ORDER — ENOXAPARIN SODIUM 40 MG/0.4ML IJ SOSY
40.0000 mg | PREFILLED_SYRINGE | INTRAMUSCULAR | Status: DC
  Administered 2021-08-14 – 2021-08-17 (×4): 40 mg via SUBCUTANEOUS
  Filled 2021-08-14 (×4): qty 0.4

## 2021-08-14 MED ORDER — METHOCARBAMOL 500 MG PO TABS: 500.0000 mg | ORAL_TABLET | Freq: Three times a day (TID) | ORAL | Status: DC | PRN

## 2021-08-14 MED ORDER — ONDANSETRON HCL 4 MG/2ML IJ SOLN: 4.0000 mg | Freq: Four times a day (QID) | INTRAMUSCULAR | Status: DC | PRN

## 2021-08-14 MED ORDER — GABAPENTIN 400 MG PO CAPS
800.0000 mg | ORAL_CAPSULE | Freq: Three times a day (TID) | ORAL | Status: DC | PRN
  Filled 2021-08-14: qty 2

## 2021-08-14 MED ORDER — SODIUM CHLORIDE 0.9 % IV SOLN
500.0000 mg | INTRAVENOUS | Status: DC
  Administered 2021-08-15: 500 mg via INTRAVENOUS
  Filled 2021-08-14: qty 500

## 2021-08-14 MED ORDER — HYDROCHLOROTHIAZIDE 25 MG PO TABS
25.0000 mg | ORAL_TABLET | Freq: Every day | ORAL | Status: DC
  Administered 2021-08-14 – 2021-08-17 (×4): 25 mg via ORAL
  Filled 2021-08-14 (×4): qty 1

## 2021-08-14 NOTE — Assessment & Plan Note (Signed)
-   The patient will be continued on his antihypertensives. ?- We will place him on as needed IV labetalol. ?

## 2021-08-14 NOTE — Assessment & Plan Note (Signed)
-   We will continue aspirin and Plavix as well as statin therapy. 

## 2021-08-14 NOTE — H&P (Signed)
?  ?  ?[place] ? ? ?PATIENT NAME: Lucas Ramirez   ? ?MR#:  [id number] ? ?DATE OF BIRTH:  10/10/1955 ? ?DATE OF ADMISSION:  08/13/2021 ? ?PRIMARY CARE PHYSICIAN: Birdie Sons, MD  ? ?Patient is coming from: Home ? ?REQUESTING/REFERRING PHYSICIAN: Lurline Hare, MD ? ?CHIEF COMPLAINT:  ? ?Chief Complaint  ?Patient presents with  ?? Shortness of Breath  ?? Weakness  ? ? ?HISTORY OF PRESENT ILLNESS:  ?Lucas Ramirez is a 66 y.o. male with medical history significant for COPD, GERD, hypertension, dyslipidemia and migraine, who presented to the ER with acute onset of worsening dyspnea with associated wheezing and cough productive of whitish sputum since yesterday.  He has been having mild chills with no measured fever.  No chest pain or palpitations.  No nausea or vomiting or abdominal pain.  He has been having lower extremity edema.  No worsening orthopnea or paroxysmal nocturnal dyspnea. ? ?ED Course: Upon presenting to the emergency room, BP was 170/1 1 with heart rate of 108 and respiratory to 23 and pulse symmetry was 100% on 3 L of O2 by nasal cannula.  Labs revealed hyponatremia 131 and hypochloremia 96 with calcium of 7.6 and total protein of 6.4 with albumin 3.1.  High-sensitivity troponin was 23 and later the same.  Lactic acid was 1.2 and procalcitonin 0.21 CBC showed leukocytosis 13.8.  The with neutrophilia as well as thrombocytosis likely reactive.  Blood cultures were drawn. ?EKG as reviewed by me : Twelve-lead EKG showed sinus tachycardia with rate 107 with abnormal R wave progression. ?Imaging: Portable chest ray showed low inspiration study with chronic changes in the right lower lung and questionable increased haziness in the hyperinflated left lower lung field possibly atelectasis or pneumonia. ? ?The patient was given IV Rocephin and azithromycin as well as 20 mg of IV Lasix, DuoNebs, IV Solu-Medrol on p.o. Percocet.  He will be admitted to a medical telemetry bed for further admission and  management. ?PAST MEDICAL HISTORY:  ? ?Past Medical History:  ?Diagnosis Date  ?? Bell's palsy   ?? CAP (community acquired pneumonia) 06/28/2015  ?? COPD (chronic obstructive pulmonary disease) ([place])   ?? Depression   ?? GERD (gastroesophageal reflux disease)   ?? Glaucoma   ?? Hyperlipidemia   ?? Hypertension   ?? Migraine   ?? Scoliosis   ?? Shingles 2019  ? dx by dermatologist by patient report  ? ? ?PAST SURGICAL HISTORY:  ? ?Past Surgical History:  ?Procedure Laterality Date  ?? [place]  ? correcting Scoliosils per pt  ?? COLONOSCOPY WITH PROPOFOL N/A 07/04/2019  ? Procedure: COLONOSCOPY WITH PROPOFOL;  Surgeon: Lin Landsman, MD;  Location: [facility] ENDOSCOPY;  Service: Gastroenterology;  Laterality: N/A;  PRIORITY 4  ?? LUMBAR LAMINECTOMY/ DECOMPRESSION WITH MET-RX Right 10/30/2019  ? Procedure: L4-5 DECOMPRESSION;  Surgeon: Meade Maw, MD;  Location: [facility] ORS;  Service: Neurosurgery;  Laterality: Right;  ?? SKIN LESION EXCISION  06/06/2013  ? facial left cheek. Dr. Evorn Gong  ?? UPPER GI ENDOSCOPY  07/11/2008  ? [facility]. Dr. Donnella Sham. Impression. -LA Grade C reflux esophagitis. 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  ? ? ?SOCIAL HISTORY:  ? ?Social History  ? ?Tobacco Use  ?? Smoking status: Every Day  ?  Packs/day: 0.10  ?  Years: 35.00  ?  Pack years: 3.50  ?  Types: Cigarettes  ?? Smokeless tobacco: Never  ?Substance Use Topics  ?? Alcohol use: No  ? ? ?FAMILY HISTORY:  ? ?  Family History  ?Problem Relation Age of Onset  ?? Emphysema Mother   ?? Down syndrome Sister   ?? CAD Neg Hx   ?? Heart disease Neg Hx   ?? Colon cancer Neg Hx   ?? Prostate cancer Neg Hx   ? ? ?DRUG ALLERGIES:  ? ?Allergies  ?Allergen Reactions  ?? Bupropion Hcl Other (See Comments)  ?  seizures  ? ? ?REVIEW OF SYSTEMS:  ? ?ROS ?As per history of present illness. All pertinent systems were reviewed above. Constitutional, HEENT, cardiovascular, respiratory, GI, GU, musculoskeletal, neuro,  psychiatric, endocrine, integumentary and hematologic systems were reviewed and are otherwise negative/unremarkable except for positive findings mentioned above in the HPI. ? ? ?MEDICATIONS AT HOME:  ? ?Prior to Admission medications   ?Medication Sig Start Date End Date Taking? Authorizing Provider  ?amLODipine (NORVASC) 2.5 MG tablet TAKE 1 TABLET BY MOUTH ONCE DAILY 02/15/21  Yes Birdie Sons, MD  ?aspirin EC 81 MG EC tablet Take 1 tablet (81 mg total) by mouth daily. Swallow whole. 08/21/20  Yes Enzo Bi, MD  ?calcium carbonate (OSCAL) 1500 (600 Ca) MG TABS tablet Take 600 mg of elemental calcium by mouth 2 (two) times daily with a meal.   Yes [provider]  ?cholecalciferol (VITAMIN D) 1000 UNITS tablet Take 1,000 Units by mouth daily.   Yes [provider]  ?clopidogrel (PLAVIX) 75 MG tablet TAKE 1 TABLET BY MOUTH ONCE DAILY 08/04/21  Yes Kris Hartmann, NP  ?denosumab (PROLIA) 60 MG/ML SOSY injection Inject 60 mg into the skin every 6 (six) months.   Yes [provider]  ?DULoxetine (CYMBALTA) 20 MG capsule Take 20 mg by mouth 2 (two) times daily. Morning & afternoon   Yes Jennings Books K, MD  ?erythromycin ophthalmic ointment SMARTSIG:In Eye(s) 08/04/21  Yes [provider]  ?furosemide (LASIX) 20 MG tablet TAKE 1 TABLET BY MOUTH EVERY OTHER DAY AS NEEDED FOR EDEMA 04/04/21  Yes Birdie Sons, MD  ?gabapentin (NEURONTIN) 800 MG tablet TAKE 1 TABLET BY MOUTH 3 TIMES DAILY AS NEEDED AS DIRECTED 12/29/20  Yes Birdie Sons, MD  ?hydrochlorothiazide (HYDRODIURIL) 25 MG tablet TAKE 1 TABLET BY MOUTH ONCE DAILY 08/04/21  Yes Birdie Sons, MD  ?HYDROcodone-acetaminophen (NORCO) 7.5-325 MG tablet TAKE 1 TABLET BY MOUTH EVERY 8 HOURS AS NEEDED FOR MODERATE PAIN 08/04/21  Yes Birdie Sons, MD  ?latanoprost (XALATAN) 0.005 % ophthalmic solution Place 1-2 drops into both eyes See admin instructions. Instill 1 drop into left eye at bedtime ?Instill 2 drops into right eye  at bedtime   Yes [provider]  ?lisinopril (ZESTRIL) 20 MG tablet TAKE 1 TABLET BY MOUTH ONCE DAILY 02/15/21  Yes Birdie Sons, MD  ?omeprazole (PRILOSEC) 20 MG capsule TAKE 1 CAPSULE BY MOUTH ONCE DAILY 08/04/21  Yes Birdie Sons, MD  ?rosuvastatin (CRESTOR) 20 MG tablet TAKE 1 TABLET BY MOUTH AT BEDTIME 08/04/21  Yes Birdie Sons, MD  ?SF 5000 PLUS 1.1 % CREA dental cream Place 1 application onto teeth in the morning and at bedtime. 09/24/19  Yes [provider]  ?acetaminophen (TYLENOL) 500 MG tablet Take 1 tablet (500 mg total) by mouth every 6 (six) hours as needed for mild pain. 10/30/19   Lonell Face, NP  ?varenicline (CHANTIX) 1 MG tablet Take by mouth. 08/31/20   [provider]  ? ?  ? ?VITAL SIGNS:  ?Blood pressure (!) 169/88, pulse (!) 102, temperature 99.2 ?F (37.3 ?C),  temperature source Oral, resp. rate 18, height 5' 6" (1.676 m), weight 54.4 kg, SpO2 93 %. ? ?PHYSICAL EXAMINATION:  ?Physical Exam ? ?GENERAL:  66 y.o.-year-old Caucasian male patient lying in the bed with mild respiratory distress with conversational dyspnea. ?EYES: Pupils equal, round, reactive to light and accommodation. No scleral icterus. Extraocular muscles intact.  ?HEENT: Head atraumatic, normocephalic. Oropharynx and nasopharynx clear.  ?NECK:  Supple, no jugular venous distention. No thyroid enlargement, no tenderness.  ?LUNGS: Diminished bibasilar breath sounds with left basal crackles and diffuse extremity wheezes with tight expiratory airflow and harsh vesicular breathing.  No use of accessory muscles of respiration.  ?CARDIOVASCULAR: Regular rate and rhythm, S1, S2 normal. No murmurs, rubs, or gallops.  ?ABDOMEN: Soft, nondistended, nontender. Bowel sounds present. No organomegaly or mass.  ?EXTREMITIES: 1-2+ bilateral lower extremity pitting edema with no cyanosis, or clubbing.  ?NEUROLOGIC: Cranial nerves II through XII are intact. Muscle strength 5/5 in all extremities. Sensation  intact. Gait not checked.  ?PSYCHIATRIC: The patient is alert and oriented x 3.  Normal affect and good eye contact. ?SKIN: No obvious rash, lesion, or ulcer.  ? ?LABORATORY PANEL:  ? ?CBC ?Recent Labs  ?Lab 08/14/21 ?[place]

## 2021-08-14 NOTE — Progress Notes (Signed)
Brief hospitalist update note.  This is a nonbillable note.  Please see same-day H&P from Dr. Sidney Ace for full billable details. ? ?Briefly, this is a 66 year old male with history significant for COPD, hypertension, hyperlipidemia, migraine, GERD who presents to the ED with acute onset of worsening shortness of breath with associated wheezing and cough productive of white sputum.  Also associated mild chills.  No fevers noted. ? ?Patient has complaints of pain in bilateral feet as well as low back.  Clinical presentation appears consistent with COPD exacerbation and concomitant community-acquired pneumonia and associated sepsis.  Will continue current course of treatment with IV steroids, nebulizers/bronchodilators, community-acquired pneumonia coverage with Rocephin and azithromycin. ? ?Remainder of care per H&P ? ?Ralene Muskrat MD ? ?No charge ?

## 2021-08-14 NOTE — Assessment & Plan Note (Signed)
-   This could be the culprit for his COPD exacerbation. ?- We will place him on IV Rocephin and Zithromax. ?- We will follow blood cultures. ?

## 2021-08-14 NOTE — Assessment & Plan Note (Signed)
-   The patient be hydrated with IV normal saline and will follow sodium level. ?

## 2021-08-14 NOTE — Assessment & Plan Note (Signed)
-   The patient will be admitted to a medical telemetry bed. ?- We will continue steroid therapy with IV Solu-Medrol. ?- We will continue bronchodilator therapy with DuoNebs 4 times daily and every 4 hours as needed. ?- We will continue antibiotic therapy with IV Rocephin and Zithromax. ?- Mucolytic therapy will be provided. ?

## 2021-08-14 NOTE — Progress Notes (Signed)
Patient HR was elevated up to 124 NP was notified and order received.  ?

## 2021-08-14 NOTE — Progress Notes (Addendum)
Initial Nutrition Assessment ? ?DOCUMENTATION CODES:  ? ?Not applicable ? ?INTERVENTION:  ? ?Ensure Enlive po TID, each supplement provides 350 kcal and 20 grams of protein. ? ?MVI po daily  ? ?Liberalize diet  ? ?Pt at high refeed risk; recommend monitor potassium, magnesium and phosphorus labs daily until stable ? ?NUTRITION DIAGNOSIS:  ? ?Increased nutrient needs related to catabolic illness (COPD) as evidenced by estimated needs. ? ?GOAL:  ? ?Patient will meet greater than or equal to 90% of their needs ? ?MONITOR:  ? ?PO intake, Supplement acceptance, Labs, Weight trends, Skin, I & O's ? ?REASON FOR ASSESSMENT:  ? ?Consult ?Assessment of nutrition requirement/status ? ?ASSESSMENT:  ? ?66 y/o male with h/o COPD, HTN, depression, GERD, HTN, HLD and CVA who is admitted with CAP and sepsis. ? ?RD working remotely. ? ?Spoke with pt via phone. Pt reports poor oral intake at baseline; pt reports that sometimes he eats better than others. Pt does report that he drinks chocolate [employer] brand supplements at home. Pt eating 20% of meals in hospital. Per chart, pt is down 27lbs(19%) in less than one year; this is significant weight loss. RD will add supplements and MVI to help pt meet his estimated needs. RD will also liberalize pt's diet. Pt is at high refeed risk. Pt is at high risk for malnutrition. RD will follow up to obtain NFPE.  ? ?Medications reviewed and include: aspirin, oscal, lovenox, protonix, ceftriaxone  ? ?Labs reviewed: Na 131(L) ? ?NUTRITION - FOCUSED PHYSICAL EXAM: ?Unable to perform at this time  ? ?Diet Order:   ?Diet Order   ? ?       ?  Diet Heart Room service appropriate? Yes; Fluid consistency: Thin  Diet effective now       ?  ? ?  ?  ? ?  ? ?EDUCATION NEEDS:  ? ?Education needs have been addressed ? ?Skin:  Skin Assessment: Reviewed RN Assessment ? ?Last BM:  4/22 ? ?Height:  ? ?Ht Readings from Last 1 Encounters:  ?08/13/21 '5\' 6"'$  (1.676 m)  ? ? ?Weight:  ? ?Wt Readings from Last 1 Encounters:   ?08/13/21 54.4 kg  ? ? ?Ideal Body Weight:  64.5 kg ? ?BMI:  Body mass index is 19.37 kg/m?. ? ?Estimated Nutritional Needs:  ? ?Kcal:  1700-2000kcal/day ? ?Protein:  85-100g/day ? ?Fluid:  1.4-1.6L/day ? ?Koleen Distance MS, RD, LDN ?Please refer to [facility] for RD and/or RD on-call/weekend/after hours pager ? ?

## 2021-08-14 NOTE — Progress Notes (Signed)
1 of 4 occurences ?

## 2021-08-14 NOTE — Assessment & Plan Note (Signed)
-   We will continue statin therapy. 

## 2021-08-14 NOTE — Progress Notes (Signed)
PT Cancellation Note ? ?Patient Details ?Name: Lucas Ramirez ?MRN: [id number] ?DOB: 02-28-1956 ? ? ?Cancelled Treatment:    Reason Eval/Treat Not Completed: Patient declined, no reason specified PT order received and chart reviewed. Upon entering room patient was sleeping, however awoke easily to his name. Patient stated he is not up for therapy right now and to try again later despite max encouragement. Will re-attempt at a later time/date as available and patient medically appropriate for PT. Thank you! ? ?Iva Boop, PT  ?08/14/21. 1:51 PM ? ?

## 2021-08-14 NOTE — Assessment & Plan Note (Signed)
-   This is manifested by leukocytosis, and tachycardia as well as tachypnea. ?- Management as above. ?- The patient will be hydrated with IV normal saline. ?

## 2021-08-14 NOTE — ED Notes (Signed)
Dr. Mansy at bedside. 

## 2021-08-14 NOTE — Progress Notes (Signed)
2 of 4 occurences ?

## 2021-08-14 NOTE — Progress Notes (Signed)
3rd of 4 occurences ?

## 2021-08-15 ENCOUNTER — Inpatient Hospital Stay: Payer: Medicare PPO

## 2021-08-15 ENCOUNTER — Encounter: Payer: Self-pay | Admitting: Family Medicine

## 2021-08-15 DIAGNOSIS — J441 Chronic obstructive pulmonary disease with (acute) exacerbation: Secondary | ICD-10-CM | POA: Diagnosis not present

## 2021-08-15 MED ORDER — IOHEXOL 350 MG/ML SOLN
75.0000 mL | Freq: Once | INTRAVENOUS | Status: AC | PRN
  Administered 2021-08-15: 75 mL via INTRAVENOUS

## 2021-08-15 MED ORDER — METHOCARBAMOL 500 MG PO TABS
500.0000 mg | ORAL_TABLET | Freq: Three times a day (TID) | ORAL | Status: DC
  Administered 2021-08-15 – 2021-08-17 (×7): 500 mg via ORAL
  Filled 2021-08-15 (×7): qty 1

## 2021-08-15 MED ORDER — AZITHROMYCIN 250 MG PO TABS
500.0000 mg | ORAL_TABLET | Freq: Every day | ORAL | Status: DC
  Administered 2021-08-16 – 2021-08-17 (×2): 500 mg via ORAL
  Filled 2021-08-15 (×2): qty 2

## 2021-08-15 MED ORDER — IPRATROPIUM-ALBUTEROL 0.5-2.5 (3) MG/3ML IN SOLN
3.0000 mL | Freq: Three times a day (TID) | RESPIRATORY_TRACT | Status: DC
  Administered 2021-08-15 – 2021-08-17 (×7): 3 mL via RESPIRATORY_TRACT
  Filled 2021-08-15 (×7): qty 3

## 2021-08-15 NOTE — Progress Notes (Signed)
PHARMACIST - PHYSICIAN COMMUNICATION ? ?CONCERNING: Antibiotic IV to Oral Route Change Policy ? ?RECOMMENDATION: ?This patient is receiving azithromycin by the intravenous route.  Based on criteria approved by the Pharmacy and Therapeutics Committee, the antibiotic(s) is/are being converted to the equivalent oral dose form(s). ? ? ?DESCRIPTION: ?These criteria include: ?Patient being treated for a respiratory tract infection, urinary tract infection, cellulitis or clostridium difficile associated diarrhea if on metronidazole ?The patient is not neutropenic and does not exhibit a GI malabsorption state ?The patient is eating (either orally or via tube) and/or has been taking other orally administered medications for a least 24 hours ?The patient is improving clinically and has a Tmax < 100.5 ? ?If you have questions about this conversion, please contact the Pharmacy Department  ? ?Benita Gutter  ?08/15/21  ?  ?

## 2021-08-15 NOTE — Evaluation (Signed)
Physical Therapy Evaluation ?Patient Details ?Name: Lucas Ramirez ?MRN: [id number] ?DOB: [birth year] ?Today's Date: 08/15/2021 ? ?History of Present Illness ? Lucas Ramirez is a 66 y.o. male with medical history significant for COPD, GERD, hypertension, dyslipidemia and migraine, who presented to the ER with acute onset of worsening dyspnea with associated wheezing and cough productive of whitish sputum since yesterday.  He has been having mild chills with no measured fever.  No chest pain or palpitations.  No nausea or vomiting or abdominal pain.  He has been having lower extremity edema.  No worsening orthopnea or paroxysmal nocturnal dyspnea.  ?Clinical Impression ? Pt is a pleasant 66 year old male who was admitted for COPD exacerbation. Pt performs transfers and ambulation with min assist and RW.  Pt is very anxious to perform mobility due to history of falls and other deficits. Pt demonstrates deficits with endurance/mobility/strength/balance. During ambulation, pt with ataxia noted on R LE and reports chronic R foot pain. Not safe to ambulate without constant assist. Would benefit from skilled PT to address above deficits and promote optimal return to PLOF; recommend transition to STR upon discharge from acute hospitalization. ? ?   ? ?Recommendations for follow up therapy are one component of a multi-disciplinary discharge planning process, led by the attending physician.  Recommendations may be updated based on patient status, additional functional criteria and insurance authorization. ? ?Follow Up Recommendations Skilled nursing-short term rehab (<3 hours/day) ? ?  ?Assistance Recommended at Discharge Intermittent Supervision/Assistance  ?Patient can return home with the following ? A lot of help with walking and/or transfers;A lot of help with bathing/dressing/bathroom;Help with stairs or ramp for entrance ? ?  ?Equipment Recommendations  (TBD)  ?Recommendations for Other Services ?    ?  ?Functional Status  Assessment Patient has had a recent decline in their functional status and demonstrates the ability to make significant improvements in function in a reasonable and predictable amount of time.  ? ?  ?Precautions / Restrictions Precautions ?Precautions: Fall ?Restrictions ?Weight Bearing Restrictions: No  ? ?  ? ?Mobility ? Bed Mobility ?  ?  ?  ?  ?  ?  ?  ?General bed mobility comments: not performed, received in chair ?  ? ?Transfers ?Overall transfer level: Needs assistance ?Equipment used: Rolling walker (2 wheels) ?Transfers: Sit to/from Stand ?Sit to Stand: Min assist ?  ?  ?  ?  ?  ?General transfer comment: needs cues for hand placement. Once standing, upright posture noted. Increased effort with mobility ?  ? ?Ambulation/Gait ?Ambulation/Gait assistance: Min assist ?Gait Distance (Feet): 10 Feet ?Assistive device: Rolling walker (2 wheels) ?Gait Pattern/deviations: Step-to pattern ?  ?  ?  ?General Gait Details: ataxic movement with min assist required. Poor sequencing of RW. All mobility performed on RA with sats decreasing to 89%. O2 reapplied with sats returning to 91%. Quick fatigue ? ?Stairs ?  ?  ?  ?  ?  ? ?Wheelchair Mobility ?  ? ?Modified Rankin (Stroke Patients Only) ?  ? ?  ? ?Balance Overall balance assessment: Needs assistance ?Sitting-balance support: Single extremity supported ?Sitting balance-Leahy Scale: Fair ?  ?  ?Standing balance support: Bilateral upper extremity supported, During functional activity ?Standing balance-Leahy Scale: Poor ?  ?  ?  ?  ?  ?  ?  ?  ?  ?  ?  ?  ?   ? ? ? ?Pertinent Vitals/Pain Pain Assessment ?Pain Assessment: Faces ?Faces Pain Scale: Hurts even more ?Pain  Location: R foot ?Pain Descriptors / Indicators: Aching ?Pain Intervention(s): Limited activity within patient's tolerance, Repositioned  ? ? ?Home Living Family/patient expects to be discharged to:: Private residence ?Living Arrangements: Spouse/significant other ?Available Help at Discharge:  Family;Available PRN/intermittently ?Type of Home: House ?Home Access: Stairs to enter (reports he just got a ramp built yesterday) ?Entrance Stairs-Rails: Left ?Entrance Stairs-Number of Steps: 3 ?  ?Home Layout: One level ?Home Equipment: Cane - single Barista (2 wheels);Rollator (4 wheels);Shower seat - built in;Grab bars - tub/shower;Grab bars - toilet ?   ?  ?Prior Function Prior Level of Function : Independent/Modified Independent ?  ?  ?  ?  ?  ?  ?Mobility Comments: recently switched from using [place] to using RW. reports multiple falls ?ADLs Comments: Performs ADLs INDly; wife handles driving, cooking, cleaning. Per patient wife is unable to physically assist and is preparing for knee surgery ?  ? ? ?Hand Dominance  ?   ? ?  ?Extremity/Trunk Assessment  ? Upper Extremity Assessment ?Upper Extremity Assessment: Generalized weakness (B UE grossly 4/5) ?  ? ?Lower Extremity Assessment ?Lower Extremity Assessment: Generalized weakness (R LE grossly 3/5 ataxic with movement; L LE grossly 4/5) ?  ? ?   ?Communication  ? Communication: No difficulties  ?Cognition Arousal/Alertness: Awake/alert ?Behavior During Therapy: [facility] for tasks assessed/performed ?Overall Cognitive Status: Within Functional Limits for tasks assessed ?  ?  ?  ?  ?  ?  ?  ?  ?  ?  ?  ?  ?  ?  ?  ?  ?General Comments: anxious for all mobility, however pleasant and agreeable ?  ?  ? ?  ?General Comments   ? ?  ?Exercises Other Exercises ?Other Exercises: seated ther-ex performed on B LE including LAQ, alt marching, and AP. 10 reps performed with supervision  ? ?Assessment/Plan  ?  ?PT Assessment Patient needs continued PT services  ?PT Problem List Decreased strength;Decreased activity tolerance;Decreased balance;Decreased mobility;Decreased safety awareness;Cardiopulmonary status limiting activity;Pain ? ?   ?  ?PT Treatment Interventions Gait training;DME instruction;Therapeutic exercise;Balance training   ? ?PT Goals (Current goals  can be found in the Care Plan section)  ?Acute Rehab PT Goals ?Patient Stated Goal: to go home ?PT Goal Formulation: With patient ?Time For Goal Achievement: 08/29/21 ?Potential to Achieve Goals: Good ? ?  ?Frequency Min 2X/week ?  ? ? ?Co-evaluation   ?  ?  ?  ?  ? ? ?  ?AM-PAC PT "6 Clicks" Mobility  ?Outcome Measure Help needed turning from your back to your side while in a flat bed without using bedrails?: A Little ?Help needed moving from lying on your back to sitting on the side of a flat bed without using bedrails?: A Little ?Help needed moving to and from a bed to a chair (including a wheelchair)?: A Little ?Help needed standing up from a chair using your arms (e.g., wheelchair or bedside chair)?: A Little ?Help needed to walk in hospital room?: A Lot ?Help needed climbing 3-5 steps with a railing? : A Lot ?6 Click Score: 16 ? ?  ?End of Session Equipment Utilized During Treatment: Gait belt;Oxygen ?Activity Tolerance: Patient limited by fatigue ?Patient left: in chair;with nursing/sitter in room ?Nurse Communication: Mobility status ?PT Visit Diagnosis: Unsteadiness on feet (R26.81);Muscle weakness (generalized) (M62.81);History of falling (Z91.81);Difficulty in walking, not elsewhere classified (R26.2);Pain ?Pain - Right/Left: Right ?Pain - part of body: Ankle and joints of foot ?  ? ?Time: 6389-3734 ?PT Time  Calculation (min) (ACUTE ONLY): 13 min ? ? ?Charges:   PT Evaluation ?$PT Eval Low Complexity: 1 Low ?  ?  ?   ? ? ?Greggory Stallion, PT, DPT, GCS ?[phone] ? ? ?Tomoya Ringwald ?08/15/2021, 1:16 PM ? ?

## 2021-08-15 NOTE — Progress Notes (Signed)
PT Cancellation Note ? ?Patient Details ?Name: Lucas Ramirez ?MRN: [id number] ?DOB: January 20, 1956 ? ? ?Cancelled Treatment:    Reason Eval/Treat Not Completed: Other (comment). Pt with pending imaging for CT angio for possible PE. Will hold off on exertional mobility until medically cleared. ? ? ?Lea Baine ?08/15/2021, 9:44 AM ?Greggory Stallion, PT, DPT, GCS ?[phone] ? ?

## 2021-08-15 NOTE — Progress Notes (Signed)
?PROGRESS NOTE ? ? ? ?Lucas Ramirez  ZYS:[id number] DOB: [birth year] DOA: 08/13/2021 ?PCP: Birdie Sons, MD  ? ? ?Brief Narrative:  ? 66 year old male with history significant for COPD, hypertension, hyperlipidemia, migraine, GERD who presents to the ED with acute onset of worsening shortness of breath with associated wheezing and cough productive of white sputum.  Also associated mild chills.  No fevers noted. ?  ?Patient has complaints of pain in bilateral feet as well as low back.  Clinical presentation appears consistent with COPD exacerbation and concomitant community-acquired pneumonia and associated sepsis. ? ?Patient improving over interval.  Main complaint seems to be pain in bilateral feet and lower back ? ?Assessment & Plan: ?  ?Principal Problem: ?  COPD exacerbation ([place]) ?Active Problems: ?  CAP (community acquired pneumonia) ?  Sepsis due to pneumonia [facility]) ?  Hypertensive urgency ?  Hyponatremia ?  History of CVA (cerebrovascular accident) ?  Dyslipidemia ? ?Acute exacerbation of COPD ?Concomitant community-acquired pneumonia ?Sepsis secondary to above ?Likely than infectious process drove COPD exacerbation ?Patient improving clinically over interval ?Sepsis physiology improving ?Plan: ?Continue IV Rocephin ?Continue IV azithromycin ?Scheduled and as needed bronchodilators ?IV Solu-Medrol, wean to p.o. prednisone tomorrow ?Follow infectious panel ?Monitor vitals and fever curve ?Oxygen as necessary ?No IV fluids ? ?Hypertensive urgency ?Improved ?Continue PTA blood pressure regimen ?As needed IV labetalol ? ?Hyperlipidemia ?PTA statin ? ?History of CVA ?PTA DAPT with aspirin and Plavix ?PTA statin ? ?Hyponatremia ?Improved, mild, asymptomatic ?No further IV fluids ? ?Suspected severe malnutrition ?Patient with significant weight loss over the past year per RD ?Pending official evaluation however suspect severe malnutrition ?Plan: ?RD follow-up ? ? ?DVT prophylaxis: SQ Lovenox ?Code Status:  Full ?Family Communication: Attempted to call spouse Jenny Reichmann [phone] on 4/23.  No answer.  Voicemail full ?Disposition Plan: Status is: Inpatient ?Remains inpatient appropriate because: Acute decompensated COPD with concomitant CAP.  Anticipate discharge in 24 to 48 hours ? ? ?Level of care: Telemetry Medical ? ?Consultants:  ?None ? ?Procedures:  ?None ? ?Antimicrobials: ?Rocephin ?Azithromycin ? ? ?Subjective: ?Seen and examined.  Endorses pain in bilateral feet.  Endorses improvement in respiratory symptoms. ? ?Objective: ?Vitals:  ? 08/14/21 2013 08/15/21 0457 08/15/21 0725 08/15/21 0732  ?BP: (!) 141/79 131/69 129/66   ?Pulse: (!) 101 89 78 80  ?Resp: '16 20 18 16  '$ ?Temp: 98.1 ?F (36.7 ?C) 98.5 ?F (36.9 ?C) 98.7 ?F (37.1 ?C)   ?TempSrc: Oral Oral    ?SpO2: 97% 97% 95% 98%  ?Weight:      ?Height:      ? ? ?Intake/Output Summary (Last 24 hours) at 08/15/2021 1015 ?Last data filed at 08/15/2021 1000 ?Gross per 24 hour  ?Intake 1578.25 ml  ?Output 900 ml  ?Net 678.25 ml  ? ?Filed Weights  ? 08/13/21 2248  ?Weight: 54.4 kg  ? ? ?Examination: ? ?General exam: No acute distress.  Appears frail and chronically ill ?Respiratory system: Diminished lung sounds bilaterally.  Scattered crackles.  No wheeze.  Normal work of breathing.  1 L ?Cardiovascular system: S1-S2, RRR, no murmurs, no pedal edema ?Gastrointestinal system: Thin/scaphoid, soft, NT/ND, normal bowel sounds ?Central nervous system: Alert and oriented. No focal neurological deficits. ?Extremities: Symmetrically decreased power.  Muscle wasting noted ?Skin: Thin and pale with no obvious rashes or lesions ?Psychiatry: Judgement and insight appear normal. Mood & affect appropriate.  ? ? ? ?Data Reviewed: I have personally reviewed following labs and imaging studies ? ?CBC: ?Recent Labs  ?Lab 08/13/21 ?  2353 08/14/21 ?0518  ?WBC 13.3* 10.2  ?NEUTROABS 10.9*  --   ?HGB 13.0 12.0*  ?HCT 38.4* 34.9*  ?MCV 87.1 85.7  ?PLT 423* 400  ? ?Basic Metabolic Panel: ?Recent  Labs  ?Lab 08/13/21 ?2353 08/14/21 ?0518  ?NA 131* 131*  ?K 4.1 3.7  ?CL 96* 97*  ?CO2 25 23  ?GLUCOSE 130* 158*  ?BUN 20 19  ?CREATININE 1.02 0.92  ?CALCIUM 7.6* 7.2*  ? ?GFR: ?Estimated Creatinine Clearance: 61.6 mL/min (by C-G formula based on SCr of 0.92 mg/dL). ?Liver Function Tests: ?Recent Labs  ?Lab 08/13/21 ?2353  ?AST 28  ?ALT 18  ?ALKPHOS 80  ?BILITOT 0.6  ?PROT 6.4*  ?ALBUMIN 3.1*  ? ?No results for input(s): LIPASE, AMYLASE in the last 168 hours. ?No results for input(s): AMMONIA in the last 168 hours. ?Coagulation Profile: ?Recent Labs  ?Lab 08/14/21 ?0518  ?INR 1.0  ? ?Cardiac Enzymes: ?No results for input(s): CKTOTAL, CKMB, CKMBINDEX, TROPONINI in the last 168 hours. ?BNP (last 3 results) ?No results for input(s): PROBNP in the last 8760 hours. ?HbA1C: ?No results for input(s): HGBA1C in the last 72 hours. ?CBG: ?No results for input(s): GLUCAP in the last 168 hours. ?Lipid Profile: ?No results for input(s): CHOL, HDL, LDLCALC, TRIG, CHOLHDL, LDLDIRECT in the last 72 hours. ?Thyroid Function Tests: ?No results for input(s): TSH, T4TOTAL, FREET4, T3FREE, THYROIDAB in the last 72 hours. ?Anemia Panel: ?No results for input(s): VITAMINB12, FOLATE, FERRITIN, TIBC, IRON, RETICCTPCT in the last 72 hours. ?Sepsis Labs: ?Recent Labs  ?Lab 08/14/21 ?0051 08/14/21 ?0518  ?PROCALCITON  --  0.21  ?LATICACIDVEN 1.2  --   ? ? ?Recent Results (from the past 240 hour(s))  ?Culture, blood (routine x 2)     Status: None (Preliminary result)  ? Collection Time: 08/14/21 12:51 AM  ? Specimen: BLOOD  ?Result Value Ref Range Status  ? Specimen Description BLOOD BLOOD RIGHT HAND  Final  ? Special Requests   Final  ?  BOTTLES DRAWN AEROBIC AND ANAEROBIC Blood Culture results may not be optimal due to an inadequate volume of blood received in culture bottles  ? Culture   Final  ?  NO GROWTH 1 DAY ?Performed at [facility], [address], [place], [place] [address] ?  ? Report Status PENDING  Incomplete   ?Culture, blood (routine x 2)     Status: None (Preliminary result)  ? Collection Time: 08/14/21 12:51 AM  ? Specimen: BLOOD  ?Result Value Ref Range Status  ? Specimen Description BLOOD BLOOD RIGHT ARM  Final  ? Special Requests   Final  ?  BOTTLES DRAWN AEROBIC AND ANAEROBIC Blood Culture results may not be optimal due to an inadequate volume of blood received in culture bottles  ? Culture   Final  ?  NO GROWTH 1 DAY ?Performed at [facility], [address] [place], [place] [address] ?  ? Report Status PENDING  Incomplete  ?  ? ? ? ? ? ?Radiology Studies: ?CT Angio Chest Pulmonary Embolism (PE) W or WO Contrast ? ?Result Date: 08/15/2021 ?CLINICAL DATA:  66 year old male with shortness of breath and elevated D-dimer. EXAM: CT ANGIOGRAPHY CHEST WITH CONTRAST TECHNIQUE: Multidetector CT imaging of the chest was performed using the standard protocol during bolus administration of intravenous contrast. Multiplanar CT image reconstructions and MIPs were obtained to evaluate the vascular anatomy. RADIATION DOSE REDUCTION: This exam was performed according to the departmental dose-optimization program which includes automated exposure control, adjustment of the mA  and/or kV according to patient size and/or use of iterative reconstruction technique. CONTRAST:  15m OMNIPAQUE IOHEXOL 350 MG/ML SOLN COMPARISON:  04/16/2019 CT and prior studies FINDINGS: Cardiovascular: This is a technically satisfactory study. No pulmonary emboli are identified. UPPER limits normal heart size noted. Coronary artery and aortic atherosclerotic calcifications are identified without thoracic aortic aneurysm/dissection. No pericardial effusion. Mediastinum/Nodes: No enlarged mediastinal, hilar, or axillary lymph nodes. Thyroid gland, trachea, and esophagus demonstrate no significant findings. Lungs/Pleura: Patchy ground-glass opacities within the LEFT UPPER lobe and LEFT LOWER lobe are nonspecific but may represent infection.  Moderate RIGHT LOWER lung atelectasis noted with possible areas of airspace disease/consolidation. Mild to moderate LEFT LOWER lobe atelectasis identified. Mild centrilobular and paraseptal emphysema again identified. No pleur

## 2021-08-15 NOTE — TOC Initial Note (Signed)
Transition of Care (TOC) - Initial/Assessment Note  ? ? ?Patient Details  ?Name: Lucas Ramirez ?MRN: [id number] ?Date of Birth: 11-Dec-1955 ? ?Transition of Care (TOC) CM/SW Contact:    ?Adelene Amas, LCSW ?Phone Number: [phone] ?08/15/2021, 12:12 PM ? ?Clinical Narrative:                 ? ?Patient presents to [facility] due to SOB and weakness.  Patient has hx of COPD and HTN. Patient pending PT/PT evaluations, patient unable to participate today due to pending imaging for CT angio for possible PE.  Plan for d/c is 48 hours. Main contact Lilley,[name] (Spouse)  ?[phone] (Mobile). ? ?Expected Discharge Plan: [place] ?Barriers to Discharge: Continued Medical Work up ? ? ?Patient Goals and CMS Choice ?  ?  ?  ? ?Expected Discharge Plan and Services ?Expected Discharge Plan: [place] ?In-house Referral: Clinical Social Work ?  ?Post Acute Care Choice: [place] ?Living arrangements for the past 2 months: [place] (Riva,[name] (Spouse)   [phone] ([place])) ?                ?  ?  ?  ?  ?  ?  ?  ?  ?  ?  ? ?Prior Living Arrangements/Services ?Living arrangements for the past 2 months: [place] (Valone,[name] (Spouse)   [phone] (Mobile)) ?Lives with:: Spouse ?Patient language and need for interpreter reviewed:: No ?Do you feel safe going back to the place where you live?: Yes      ?Need for Family Participation in Patient Care: Yes (Comment) ?Care giver support system in place?: Yes (comment) ?  ?Criminal Activity/Legal Involvement Pertinent to Current Situation/Hospitalization: No - Comment as needed ? ?Activities of Daily Living ?Home Assistive Devices/Equipment: Cane (specify quad or straight) ?ADL Screening (condition at time of admission) ?Patient's cognitive ability adequate to safely complete daily activities?: Yes ?Is the patient deaf or have difficulty hearing?: No ?Does the patient have difficulty seeing, even when wearing  glasses/contacts?: Yes ?Does the patient have difficulty concentrating, remembering, or making decisions?: No ?Patient able to express need for assistance with ADLs?: Yes ?Does the patient have difficulty dressing or bathing?: No ?Independently performs ADLs?: Yes (appropriate for developmental age) ?Does the patient have difficulty walking or climbing stairs?: Yes ?Weakness of Legs: Both ?Weakness of Arms/Hands: None ? ?Permission Sought/Granted ?Permission sought to share information with : Family Supports ?  ? Share Information with NAME: Aleph, Nickson (Spouse)   [phone] (Mobile) ?   ?   ?   ? ?Emotional Assessment ?Appearance:: Appears stated age ?Attitude/Demeanor/Rapport: Engaged ?Affect (typically observed): Stable ?Orientation: : Oriented to Self, Oriented to Place, Oriented to  Time, Oriented to Situation ?Alcohol / Substance Use: Not Applicable ?Psych Involvement: No (comment) ? ?Admission diagnosis:  Hyponatremia [E87.1] ?COPD exacerbation ([place]) [J44.1] ?Generalized weakness [R53.1] ?Dyspnea, unspecified type [R06.00] ?Community acquired pneumonia, unspecified laterality [J18.9] ?Patient Active Problem List  ? Diagnosis Date Noted  ? Sepsis due to pneumonia ([place]) 08/14/2021  ? Hyponatremia 08/14/2021  ? Hypertensive urgency 08/14/2021  ? History of CVA (cerebrovascular accident) 08/14/2021  ? Dyslipidemia 08/14/2021  ? Carotid stenosis 09/29/2020  ? Cerebrovascular disease 08/31/2020  ? History of CVA with residual deficit 08/31/2020  ? Edema 08/31/2020  ? Stroke (cerebrum) ([place]) 08/21/2020  ? Visual loss 08/19/2020  ? History of adenomatous polyp of colon 07/05/2019  ? Lymphadenopathy 03/31/2019  ? Vitamin B6 deficiency 11/28/2018  ? CAP (community acquired pneumonia) 06/28/2015  ? COPD  exacerbation ([place]) 06/28/2015  ? HTN (hypertension) 06/28/2015  ? Back pain, chronic 12/24/2014  ? Duodenitis 12/24/2014  ? Esophagitis 12/24/2014  ? H/O surgical procedure 12/24/2014  ? HLD (hyperlipidemia) 12/24/2014   ? Headache, migraine 12/24/2014  ? Neuropathy 12/24/2014  ? Borderline diabetes 12/24/2014  ? Scoliosis 12/24/2014  ? Compulsive tobacco user syndrome 12/24/2014  ? Vitamin D deficiency 12/24/2014  ? Bell's palsy 12/24/2014  ? Polyneuropathy 12/24/2014  ? ?PCP:  Birdie Sons, MD ?Pharmacy:   ?[facility] ?[address] ?[place] [address] ?Phone: [phone] Fax: [phone] ? ? ? ? ?Social Determinants of Health (SDOH) Interventions ?  ? ?Readmission Risk Interventions ?   ? View : No data to display.  ?  ?  ?  ? ? ? ?

## 2021-08-15 NOTE — Evaluation (Signed)
Occupational Therapy Evaluation ?Patient Details ?Name: Lucas Ramirez ?MRN: [id number] ?DOB: 02-27-1956 ?Today's Date: 08/15/2021 ? ? ?History of Present Illness Lucas Ramirez is a 66 y.o. male with medical history significant for COPD, GERD, hypertension, dyslipidemia and migraine, who presented to the ER with acute onset of worsening dyspnea with associated wheezing and cough productive of whitish sputum since yesterday.  He has been having mild chills with no measured fever.  No chest pain or palpitations.  No nausea or vomiting or abdominal pain.  He has been having lower extremity edema.  No worsening orthopnea or paroxysmal nocturnal dyspnea.  ? ?Clinical Impression ?  ?Lucas Ramirez presents with generalized weakness, reduced endurance, impaired balance, and R LE pain. He lives at home with his wife, in a single-story home, 3 STE, and reports that prior to admission he has been IND in ADL, walking with a SPC, although he recently switched to using a RW. He states he has lost "a lot" of weight in the previous year and that he has has had "10 or more" falls in the previous 6 months. Ask what he thinks are the factors attributing to this, pt replies, "my wife thinks I am depressed." Asked if he thinks that is the case, he replies, "well, maybe. I don't seem to take much interest in the things I used to like doing." During today's OT evaluation, he displays UE and LE weakness, requires min-mod assistance for bed mobility and transfers, exhibits poor standing balance and very limited standing balance tolerance. He reports that he has "barely moved" in the past 2 days. Provided educ re: falls prevention, importance of OOB mobility, engaging in meaningful occupations/activities, potential value of medication + therapy for tx of depression. Pt verbalizes understanding. O2 sats remain in mid 90s throughout session on 1 L O2. Given pt's level of debility and how far removed he is from his previous level of fxl mobility,  recommend ongoing OT during hospitalization, with DC to STR. At present, pt would be unable to participate in 3 hrs of therapy daily, but if he can increase stamina and strength over next few days, DC to CIR may be appropriate.   ? ?Recommendations for follow up therapy are one component of a multi-disciplinary discharge planning process, led by the attending physician.  Recommendations may be updated based on patient status, additional functional criteria and insurance authorization.  ? ?Follow Up Recommendations ? Other (comment) (Potential CIR candidate?)  ?  ?Assistance Recommended at Discharge Intermittent Supervision/Assistance  ?Patient can return home with the following A lot of help with walking and/or transfers;A lot of help with bathing/dressing/bathroom;Assist for transportation;Help with stairs or ramp for entrance;Assistance with cooking/housework ? ?  ?Functional Status Assessment ? Patient has had a recent decline in their functional status and demonstrates the ability to make significant improvements in function in a reasonable and predictable amount of time.  ?Equipment Recommendations ? None recommended by OT  ?  ?Recommendations for Other Services Other (comment) (psych consult re: referral/recommendation for outpt therapy) ? ? ?  ?Precautions / Restrictions Precautions ?Precautions: Fall ?Restrictions ?Weight Bearing Restrictions: No  ? ?  ? ?Mobility Bed Mobility ?Overal bed mobility: Needs Assistance ?Bed Mobility: Supine to Sit ?  ?  ?Supine to sit: Min guard ?  ?  ?General bed mobility comments: increased time, effort, cueing, ?  ? ?Transfers ?Overall transfer level: Needs assistance ?Equipment used: Rolling walker (2 wheels) ?Transfers: Sit to/from Stand, Bed to chair/wheelchair/BSC ?Sit to Stand: Min assist ?  Stand pivot transfers: Mod assist ?  ?  ?  ?  ?General transfer comment: Pt too weak to perform tasks safely ?  ? ?  ?Balance Overall balance assessment: Needs  assistance ?Sitting-balance support: Single extremity supported ?Sitting balance-Leahy Scale: Fair ?  ?  ?Standing balance support: Bilateral upper extremity supported, During functional activity ?Standing balance-Leahy Scale: Poor ?Standing balance comment: Poor standing balance, reports legs feel "very wobbly" after only a few seconds in standing ?  ?  ?  ?  ?  ?  ?  ?  ?  ?  ?  ?   ? ?ADL either performed or assessed with clinical judgement  ? ?ADL   ?  ?  ?  ?  ?  ?  ?  ?  ?  ?  ?  ?  ?  ?  ?  ?  ?  ?  ?  ?   ? ? ? ?Vision Patient Visual Report: No change from baseline ?   ?   ?Perception   ?  ?Praxis   ?  ? ?Pertinent Vitals/Pain Pain Assessment ?Pain Assessment: 0-10 ?Pain Score: 3  ?Pain Location: R foot ?Pain Descriptors / Indicators: Aching ?Pain Intervention(s): Repositioned, Limited activity within patient's tolerance  ? ? ? ?Hand Dominance   ?  ?Extremity/Trunk Assessment Upper Extremity Assessment ?Upper Extremity Assessment: Generalized weakness ?  ?Lower Extremity Assessment ?Lower Extremity Assessment: Generalized weakness ?  ?  ?  ?Communication Communication ?Communication: No difficulties ?  ?Cognition Arousal/Alertness: Awake/alert ?Behavior During Therapy: [facility] for tasks assessed/performed ?Overall Cognitive Status: Within Functional Limits for tasks assessed ?  ?  ?  ?  ?  ?  ?  ?  ?  ?  ?  ?  ?  ?  ?  ?  ?General Comments: pleasant ?  ?  ?General Comments    ? ?  ?Exercises Other Exercises ?Other Exercises: Educ re: falls prevention, safe DME use, importance of engaging in meaningful activites ?  ?Shoulder Instructions    ? ? ?Home Living Family/patient expects to be discharged to:: Private residence ?Living Arrangements: Spouse/significant other ?Available Help at Discharge: Family;Available PRN/intermittently ?Type of Home: House ?Home Access: Stairs to enter ?Entrance Stairs-Number of Steps: 3 ?Entrance Stairs-Rails: Left ?Home Layout: One level ?  ?  ?Bathroom Shower/Tub: Walk-in shower ?   ?Bathroom Toilet: Handicapped height ?  ?  ?Home Equipment: Cane - single [place] (2 wheels);Rollator (4 wheels);Shower seat - built in;Grab bars - tub/shower;Grab bars - toilet ?  ?  ?  ? ?  ?Prior Functioning/Environment Prior Level of Function : Independent/Modified Independent ?  ?  ?  ?  ?  ?  ?Mobility Comments: recently switched from using [place] to using RW ?ADLs Comments: Performs ADLs INDly; wife handles driving, cooking, cleaning ?  ? ?  ?  ?OT Problem List: Decreased strength;Decreased activity tolerance;Impaired balance (sitting and/or standing) ?  ?   ?OT Treatment/Interventions: Self-care/ADL training;Patient/family education;Therapeutic exercise;Balance training;Energy conservation;Therapeutic activities;DME and/or AE instruction  ?  ?OT Goals(Current goals can be found in the care plan section) Acute Rehab OT Goals ?Patient Stated Goal: to feel better ?OT Goal Formulation: With patient ?Time For Goal Achievement: 08/29/21 ?Potential to Achieve Goals: Good ?ADL Goals ?Pt Will Perform Lower Body Bathing: with modified independence;sit to/from stand ?Pt Will Transfer to Toilet: with modified independence;stand pivot transfer (using LRAD) ?Pt/caregiver will Perform Home Exercise Program: Increased ROM;Increased strength;Independently ?Additional ADL Goal #1: Pt will identify/demonstrate 2+ falls  prevention strategies  ?OT Frequency: Min 2X/week ?  ? ?Co-evaluation   ?  ?  ?  ?  ? ?  ?AM-PAC OT "6 Clicks" Daily Activity     ?Outcome Measure Help from another person eating meals?: None ?Help from another person taking care of personal grooming?: A Little ?Help from another person toileting, which includes using toliet, bedpan, or urinal?: A Lot ?Help from another person bathing (including washing, rinsing, drying)?: A Lot ?Help from another person to put on and taking off regular upper body clothing?: A Little ?Help from another person to put on and taking off regular lower body clothing?: A  Lot ?6 Click Score: 16 ?  ?End of Session Equipment Utilized During Treatment: Rolling walker (2 wheels) ? ?Activity Tolerance: Other (comment) (pt limited by weakness) ?Patient left: in chair;with chair alarm set ? ?OT Visit Diagno

## 2021-08-16 DIAGNOSIS — J441 Chronic obstructive pulmonary disease with (acute) exacerbation: Secondary | ICD-10-CM | POA: Diagnosis not present

## 2021-08-16 MED ORDER — PREDNISONE 20 MG PO TABS
40.0000 mg | ORAL_TABLET | Freq: Every day | ORAL | Status: DC
  Administered 2021-08-16 – 2021-08-17 (×2): 40 mg via ORAL
  Filled 2021-08-16 (×2): qty 2

## 2021-08-16 MED ORDER — CEFUROXIME AXETIL 500 MG PO TABS
500.0000 mg | ORAL_TABLET | Freq: Two times a day (BID) | ORAL | Status: DC
  Administered 2021-08-16 – 2021-08-17 (×2): 500 mg via ORAL
  Filled 2021-08-16 (×3): qty 1

## 2021-08-16 MED ORDER — CEFUROXIME AXETIL 500 MG PO TABS
500.0000 mg | ORAL_TABLET | Freq: Once | ORAL | Status: AC
  Administered 2021-08-16: 500 mg via ORAL
  Filled 2021-08-16: qty 1

## 2021-08-16 NOTE — Progress Notes (Signed)
Physical Therapy Treatment ?Patient Details ?Name: Lucas Ramirez ?MRN: [id number] ?DOB: Sep 25, 1955 ?Today's Date: 08/16/2021 ? ? ?History of Present Illness Lucas Ramirez is a 66 y.o. male with medical history significant for COPD, GERD, hypertension, dyslipidemia and migraine, who presented to the ER with acute onset of worsening dyspnea with associated wheezing and cough productive of whitish sputum since yesterday.  He has been having mild chills with no measured fever.  No chest pain or palpitations.  No nausea or vomiting or abdominal pain.  He has been having lower extremity edema.  No worsening orthopnea or paroxysmal nocturnal dyspnea. ? ?  ?PT Comments  ? ? Patient alert, up in recliner at start of session, agreeable to PT session. Did report R foot pain, RN notified at end of session, did not quantify his pain level. He did demonstrate improvement in mobility this session. Sit <> stand with RW and supervision, definite use of hands. He ambulated two bouts of ~26f with RW and CGA. Some ataxic movement noted still. scissoring of gait, almost steppage gait for RLE due to pt complaints of decreased sensation. 1 small LOB when pt stubbed his toe on the walker, corrected with CGA. Returned to room and supine with all needs in reach. The patient would benefit from further skilled PT intervention to continue to progress towards goals. Recommendation updated to HHPT with frequent/constant supervision/assistance. Pt confident his wife can currently help him, and they plan on having "Home Instead" to help as well.   ?  ?Recommendations for follow up therapy are one component of a multi-disciplinary discharge planning process, led by the attending physician.  Recommendations may be updated based on patient status, additional functional criteria and insurance authorization. ? ?Follow Up Recommendations ? Home health PT ?  ?  ?Assistance Recommended at Discharge Frequent or constant Supervision/Assistance  ?Patient can  return home with the following Help with stairs or ramp for entrance;A little help with walking and/or transfers;A little help with bathing/dressing/bathroom;Assistance with cooking/housework;Assist for transportation ?  ?Equipment Recommendations ? None recommended by PT (TBD)  ?  ?Recommendations for Other Services   ? ? ?  ?Precautions / Restrictions Precautions ?Precautions: Fall ?Restrictions ?Weight Bearing Restrictions: No  ?  ? ?Mobility ? Bed Mobility ?Overal bed mobility: Modified Independent ?  ?  ?  ?  ?  ?  ?  ?  ? ?Transfers ?Overall transfer level: Needs assistance ?Equipment used: Rolling walker (2 wheels) ?Transfers: Sit to/from Stand ?Sit to Stand: Supervision ?  ?  ?  ?  ?  ?General transfer comment: reliant on hand support but no physical assist needed ?  ? ?Ambulation/Gait ?Ambulation/Gait assistance: Min guard ?Gait Distance (Feet):  (449f and then additional 4579f?Assistive device: Rolling walker (2 wheels) ?  ?  ?  ?  ?General Gait Details: some ataxic movement noted still. scissoring of gait, almost steppage gait for RLE due to pt complaints of decreased sensation. 1 small LOB when pt stubbed his toe on the walker, corrected with CGA. ? ? ?Stairs ?  ?  ?  ?  ?  ? ? ?Wheelchair Mobility ?  ? ?Modified Rankin (Stroke Patients Only) ?  ? ? ?  ?Balance Overall balance assessment: Needs assistance ?Sitting-balance support: Single extremity supported ?Sitting balance-Leahy Scale: Fair ?  ?  ?Standing balance support: Bilateral upper extremity supported, During functional activity ?Standing balance-Leahy Scale: Fair ?  ?  ?  ?  ?  ?  ?  ?  ?  ?  ?  ?  ?  ? ?  ?  Cognition Arousal/Alertness: Awake/alert ?Behavior During Therapy: [facility] for tasks assessed/performed ?Overall Cognitive Status: Within Functional Limits for tasks assessed ?  ?  ?  ?  ?  ?  ?  ?  ?  ?  ?  ?  ?  ?  ?  ?  ?  ?  ?  ? ?  ?Exercises   ? ?  ?General Comments   ?  ?  ? ?Pertinent Vitals/Pain Pain Assessment ?Pain Assessment:  Faces ?Faces Pain Scale: Hurts even more ?Pain Location: R foot ?Pain Descriptors / Indicators: Aching, Sore ?Pain Intervention(s): Limited activity within patient's tolerance, Monitored during session, Repositioned, Patient requesting pain meds-RN notified  ? ? ?Home Living   ?  ?  ?  ?  ?  ?  ?  ?  ?  ?   ?  ?Prior Function    ?  ?  ?   ? ?PT Goals (current goals can now be found in the care plan section) Progress towards PT goals: Progressing toward goals ? ?  ?Frequency ? ? ? Min 2X/week ? ? ? ?  ?PT Plan Discharge plan needs to be updated  ? ? ?Co-evaluation   ?  ?  ?  ?  ? ?  ?AM-PAC PT "6 Clicks" Mobility   ?Outcome Measure ? Help needed turning from your back to your side while in a flat bed without using bedrails?: A Little ?Help needed moving from lying on your back to sitting on the side of a flat bed without using bedrails?: A Little ?Help needed moving to and from a bed to a chair (including a wheelchair)?: A Little ?Help needed standing up from a chair using your arms (e.g., wheelchair or bedside chair)?: A Little ?Help needed to walk in hospital room?: A Little ?Help needed climbing 3-5 steps with a railing? : A Lot ?6 Click Score: 17 ? ?  ?End of Session Equipment Utilized During Treatment: Gait belt ?Activity Tolerance: Patient tolerated treatment well ?Patient left: in bed;with call bell/phone within reach;with bed alarm set ?Nurse Communication: Mobility status ?PT Visit Diagnosis: Unsteadiness on feet (R26.81);Muscle weakness (generalized) (M62.81);History of falling (Z91.81);Difficulty in walking, not elsewhere classified (R26.2);Pain ?Pain - Right/Left: Right ?Pain - part of body: Ankle and joints of foot ?  ? ? ?Time: 5176-1607 ?PT Time Calculation (min) (ACUTE ONLY): 16 min ? ?Charges:  $Therapeutic Activity: 8-22 mins          ?          ? ?Lieutenant Diego PT, DPT ?1:50 PM,08/16/21 ? ?

## 2021-08-16 NOTE — Progress Notes (Signed)
?PROGRESS NOTE ? ? ? ?CHENG DEC  ASN:[id number] DOB: 1955/07/13 DOA: 08/13/2021 ?PCP: Birdie Sons, MD  ? ? ?Brief Narrative:  ? 66 year old male with history significant for COPD, hypertension, hyperlipidemia, migraine, GERD who presents to the ED with acute onset of worsening shortness of breath with associated wheezing and cough productive of white sputum.  Also associated mild chills.  No fevers noted. ?  ?Patient has complaints of pain in bilateral feet as well as low back.  Clinical presentation appears consistent with COPD exacerbation and concomitant community-acquired pneumonia and associated sepsis. ? ?Patient improving over interval.  Main complaint seems to be pain in bilateral feet and lower back ? ?Assessment & Plan: ?  ?Principal Problem: ?  COPD exacerbation ([place]) ?Active Problems: ?  CAP (community acquired pneumonia) ?  Sepsis due to pneumonia [facility]) ?  Hypertensive urgency ?  Hyponatremia ?  History of CVA (cerebrovascular accident) ?  Dyslipidemia ? ?Acute exacerbation of COPD ?Concomitant community-acquired pneumonia ?Sepsis secondary to above ?Likely than infectious process drove COPD exacerbation ?Patient improving clinically over interval ?Sepsis physiology resolved ?Plan: ?Continue IV Rocephin x 5 days ?Continue IV azithromycin x 3 days ?Scheduled and as needed bronchodilators ?DC solumedrol ?Start PO prednisone '40mg'$  daily x 5 days ?Follow infectious panel, NGTD ?Monitor vitals and fever curve ?Oxygen as necessary ?No IV fluids ? ?Hypertensive urgency ?Improved ?Continue PTA blood pressure regimen ?As needed IV labetalol ? ?Hyperlipidemia ?PTA statin ? ?History of CVA ?PTA DAPT with aspirin and Plavix ?PTA statin ? ?Hyponatremia ?Improved, mild, asymptomatic ?Continue to monitor ? ?Suspected severe malnutrition ?Patient with significant weight loss over the past year per RD ?Pending official evaluation however suspect severe malnutrition ?Plan: ?RD follow-up ? ? ?DVT prophylaxis: SQ  Lovenox ?Code Status: Full ?Family Communication: Attempted to call spouse Jenny Reichmann [phone] on 4/23.  No answer.  Voicemail full ?Disposition Plan: Status is: Inpatient ?Remains inpatient appropriate because: Acute decompensated COPD with concomitant CAP.  Improving.  Anticipate dc 4/25 ? ? ?Level of care: Telemetry Medical ? ?Consultants:  ?None ? ?Procedures:  ?None ? ?Antimicrobials: ?Rocephin ?Azithromycin ? ? ?Subjective: ?Seen and examined.  Respiratory status improving over interval.  Continues to complain of pain in bilateral feet but improving over interval. ? ?Objective: ?Vitals:  ? 08/16/21 0430 08/16/21 0451 08/16/21 7353 08/16/21 0816  ?BP: (!) 141/73 135/60  (!) 160/67  ?Pulse: 79 84  88  ?Resp:  18    ?Temp: 98.8 ?F (37.1 ?C) 98.8 ?F (37.1 ?C)  97.7 ?F (36.5 ?C)  ?TempSrc:  Oral    ?SpO2: 94% 97% 99% 96%  ?Weight:      ?Height:      ? ? ?Intake/Output Summary (Last 24 hours) at 08/16/2021 1004 ?Last data filed at 08/16/2021 0434 ?Gross per 24 hour  ?Intake 120 ml  ?Output 1900 ml  ?Net -1780 ml  ? ?Filed Weights  ? 08/13/21 2248  ?Weight: 54.4 kg  ? ? ?Examination: ? ?General exam: NAD.  Mentating clearly.  Frail and chronically ill-appearing ?Respiratory system: Diminished breath sounds bilaterally.  Bibasilar crackles.  Normal work of breathing.  1 L ?Cardiovascular system: S1-S2, RRR, no murmurs, no pedal edema ?Gastrointestinal system: Thin/scaphoid, soft, NT/ND, normal bowel sounds ?Central nervous system: Alert and oriented. No focal neurological deficits. ?Extremities: Symmetrically decreased power.  Muscle wasting noted ?Skin: Thin and pale with no obvious rashes or lesions ?Psychiatry: Judgement and insight appear normal. Mood & affect appropriate.  ? ? ? ?Data Reviewed: I have personally reviewed following  labs and imaging studies ? ?CBC: ?Recent Labs  ?Lab 08/13/21 ?2353 08/14/21 ?0518  ?WBC 13.3* 10.2  ?NEUTROABS 10.9*  --   ?HGB 13.0 12.0*  ?HCT 38.4* 34.9*  ?MCV 87.1 85.7  ?PLT 423* 400   ? ?Basic Metabolic Panel: ?Recent Labs  ?Lab 08/13/21 ?2353 08/14/21 ?0518  ?NA 131* 131*  ?K 4.1 3.7  ?CL 96* 97*  ?CO2 25 23  ?GLUCOSE 130* 158*  ?BUN 20 19  ?CREATININE 1.02 0.92  ?CALCIUM 7.6* 7.2*  ? ?GFR: ?Estimated Creatinine Clearance: 61.6 mL/min (by C-G formula based on SCr of 0.92 mg/dL). ?Liver Function Tests: ?Recent Labs  ?Lab 08/13/21 ?2353  ?AST 28  ?ALT 18  ?ALKPHOS 80  ?BILITOT 0.6  ?PROT 6.4*  ?ALBUMIN 3.1*  ? ?No results for input(s): LIPASE, AMYLASE in the last 168 hours. ?No results for input(s): AMMONIA in the last 168 hours. ?Coagulation Profile: ?Recent Labs  ?Lab 08/14/21 ?0518  ?INR 1.0  ? ?Cardiac Enzymes: ?No results for input(s): CKTOTAL, CKMB, CKMBINDEX, TROPONINI in the last 168 hours. ?BNP (last 3 results) ?No results for input(s): PROBNP in the last 8760 hours. ?HbA1C: ?No results for input(s): HGBA1C in the last 72 hours. ?CBG: ?No results for input(s): GLUCAP in the last 168 hours. ?Lipid Profile: ?No results for input(s): CHOL, HDL, LDLCALC, TRIG, CHOLHDL, LDLDIRECT in the last 72 hours. ?Thyroid Function Tests: ?No results for input(s): TSH, T4TOTAL, FREET4, T3FREE, THYROIDAB in the last 72 hours. ?Anemia Panel: ?No results for input(s): VITAMINB12, FOLATE, FERRITIN, TIBC, IRON, RETICCTPCT in the last 72 hours. ?Sepsis Labs: ?Recent Labs  ?Lab 08/14/21 ?0051 08/14/21 ?0518  ?PROCALCITON  --  0.21  ?LATICACIDVEN 1.2  --   ? ? ?Recent Results (from the past 240 hour(s))  ?Culture, blood (routine x 2)     Status: None (Preliminary result)  ? Collection Time: 08/14/21 12:51 AM  ? Specimen: BLOOD  ?Result Value Ref Range Status  ? Specimen Description BLOOD BLOOD RIGHT HAND  Final  ? Special Requests   Final  ?  BOTTLES DRAWN AEROBIC AND ANAEROBIC Blood Culture results may not be optimal due to an inadequate volume of blood received in culture bottles  ? Culture   Final  ?  NO GROWTH 2 DAYS ?Performed at [facility], [address] [place], [place] [address] ?  ? Report  Status PENDING  Incomplete  ?Culture, blood (routine x 2)     Status: None (Preliminary result)  ? Collection Time: 08/14/21 12:51 AM  ? Specimen: BLOOD  ?Result Value Ref Range Status  ? Specimen Description BLOOD BLOOD RIGHT ARM  Final  ? Special Requests   Final  ?  BOTTLES DRAWN AEROBIC AND ANAEROBIC Blood Culture results may not be optimal due to an inadequate volume of blood received in culture bottles  ? Culture   Final  ?  NO GROWTH 2 DAYS ?Performed at [facility], [address], [place], [place] [address] ?  ? Report Status PENDING  Incomplete  ?  ? ? ? ? ? ?Radiology Studies: ?CT Angio Chest Pulmonary Embolism (PE) W or WO Contrast ? ?Result Date: 08/15/2021 ?CLINICAL DATA:  66 year old male with shortness of breath and elevated D-dimer. EXAM: CT ANGIOGRAPHY CHEST WITH CONTRAST TECHNIQUE: Multidetector CT imaging of the chest was performed using the standard protocol during bolus administration of intravenous contrast. Multiplanar CT image reconstructions and MIPs were obtained to evaluate the vascular anatomy. RADIATION DOSE REDUCTION: This exam was performed according to the departmental  dose-optimization program which includes automated exposure control, adjustment of the mA and/or kV according to patient size and/or use of iterative reconstruction technique. CONTRAST:  77m OMNIPAQUE IOHEXOL 350 MG/ML SOLN COMPARISON:  04/16/2019 CT and prior studies FINDINGS: Cardiovascular: This is a technically satisfactory study. No pulmonary emboli are identified. UPPER limits normal heart size noted. Coronary artery and aortic atherosclerotic calcifications are identified without thoracic aortic aneurysm/dissection. No pericardial effusion. Mediastinum/Nodes: No enlarged mediastinal, hilar, or axillary lymph nodes. Thyroid gland, trachea, and esophagus demonstrate no significant findings. Lungs/Pleura: Patchy ground-glass opacities within the LEFT UPPER lobe and LEFT LOWER lobe are nonspecific but may  represent infection. Moderate RIGHT LOWER lung atelectasis noted with possible areas of airspace disease/consolidation. Mild to moderate LEFT LOWER lobe atelectasis identified. Mild centrilobular and pa

## 2021-08-17 DIAGNOSIS — J441 Chronic obstructive pulmonary disease with (acute) exacerbation: Secondary | ICD-10-CM | POA: Diagnosis not present

## 2021-08-17 MED ORDER — ALBUTEROL SULFATE HFA 108 (90 BASE) MCG/ACT IN AERS: 2.0000 | INHALATION_SPRAY | Freq: Four times a day (QID) | RESPIRATORY_TRACT | 2 refills | Status: DC | PRN

## 2021-08-17 MED ORDER — METHOCARBAMOL 500 MG PO TABS
500.0000 mg | ORAL_TABLET | Freq: Three times a day (TID) | ORAL | 0 refills | Status: AC
Start: 2021-08-17 — End: 2021-09-07

## 2021-08-17 MED ORDER — PREDNISONE 20 MG PO TABS: 40.0000 mg | ORAL_TABLET | Freq: Every day | ORAL | 0 refills | Status: AC

## 2021-08-17 MED ORDER — CEFUROXIME AXETIL 500 MG PO TABS
500.0000 mg | ORAL_TABLET | Freq: Two times a day (BID) | ORAL | 0 refills | Status: AC
Start: 2021-08-17 — End: 2021-08-21

## 2021-08-17 MED ORDER — OXYCODONE HCL 5 MG PO TABS: 5.0000 mg | ORAL_TABLET | ORAL | 0 refills | Status: DC | PRN

## 2021-08-17 NOTE — Discharge Summary (Signed)
Physician Discharge Summary  ?Lucas Ramirez MEQ:[id number] DOB: 12/08/1955 DOA: 08/13/2021 ? ?PCP: Birdie Sons, MD ? ?Admit date: 08/13/2021 ?Discharge date: 08/17/2021 ? ?Admitted From: Home ?Disposition: Home with home health ? ?Recommendations for Outpatient Follow-up:  ?Follow up with PCP in 1-2 weeks ?Ambulatory referral to pulmonology ? ?Home Health: Yes PT OT RN aide ?Equipment/Devices: None ? ?Discharge Condition: Stable ?CODE STATUS: Full ?Diet recommendation: Regular ? ?Brief/Interim Summary: ?Acute exacerbation of COPD ?Concomitant community-acquired pneumonia ?Sepsis secondary to above ?Likely than infectious process drove COPD exacerbation ?Patient improving clinically over interval ?Sepsis physiology resolved ?Plan: ?DC IV antibiotics.  Transition to p.o. antibiotics.  Complete total 7-day course.  P.o. prednisone 40 mg a day x4 additional days.  Patient on room air at time of discharge.  Ambulatory referral to pulmonology.  Discharged home in stable condition.  Provided albuterol MDI.  Patient takes no maintenance inhalers.  Will defer to outpatient pulmonary visit ?  ?Hypertensive urgency ?Improved ?Continue PTA blood pressure regimen ?  ?Hyperlipidemia ?PTA statin ?  ?History of CVA ?PTA DAPT with aspirin and Plavix ?PTA statin ?  ?Hyponatremia ?Improved, mild, asymptomatic ? ?  ?Suspected severe malnutrition ?Patient with significant weight loss over the past year per RD ?Pending official evaluation however suspect severe malnutrition ?Plan: ?RD follow-up ? ? ? ?Discharge Diagnoses:  ?Principal Problem: ?  COPD exacerbation ([place]) ?Active Problems: ?  CAP (community acquired pneumonia) ?  Sepsis due to pneumonia [facility]) ?  Hypertensive urgency ?  Hyponatremia ?  History of CVA (cerebrovascular accident) ?  Dyslipidemia ? ? ? ?Discharge Instructions ? ?Discharge Instructions   ? ? Diet - low sodium heart healthy   Complete by: As directed ?  ? Increase activity slowly   Complete by: As directed ?  ? No  wound care   Complete by: As directed ?  ? ?  ? ?Allergies as of 08/17/2021   ? ?   Reactions  ? Bupropion Hcl Other (See Comments)  ? seizures  ? ?  ? ?  ?Medication List  ?  ? ?STOP taking these medications   ? ?HYDROcodone-acetaminophen 7.5-325 MG tablet ?Commonly known as: NORCO ?  ? ?  ? ?TAKE these medications   ? ?acetaminophen 500 MG tablet ?Commonly known as: TYLENOL ?Take 1 tablet (500 mg total) by mouth every 6 (six) hours as needed for mild pain. ?  ?albuterol 108 (90 Base) MCG/ACT inhaler ?Commonly known as: VENTOLIN HFA ?Inhale 2 puffs into the lungs every 6 (six) hours as needed for wheezing or shortness of breath. ?  ?amLODipine 2.5 MG tablet ?Commonly known as: NORVASC ?TAKE 1 TABLET BY MOUTH ONCE DAILY ?  ?aspirin 81 MG EC tablet ?Take 1 tablet (81 mg total) by mouth daily. Swallow whole. ?  ?calcium carbonate 1500 (600 Ca) MG Tabs tablet ?Commonly known as: OSCAL ?Take 600 mg of elemental calcium by mouth 2 (two) times daily with a meal. ?  ?cefUROXime 500 MG tablet ?Commonly known as: CEFTIN ?Take 1 tablet (500 mg total) by mouth 2 (two) times daily with a meal for 4 days. ?  ?cholecalciferol 1000 units tablet ?Commonly known as: VITAMIN D ?Take 1,000 Units by mouth daily. ?  ?clopidogrel 75 MG tablet ?Commonly known as: PLAVIX ?TAKE 1 TABLET BY MOUTH ONCE DAILY ?  ?denosumab 60 MG/ML Sosy injection ?Commonly known as: PROLIA ?Inject 60 mg into the skin every 6 (six) months. ?  ?DULoxetine 20 MG capsule ?Commonly known as: CYMBALTA ?Take 20 mg by  mouth 2 (two) times daily. Morning & afternoon ?  ?erythromycin ophthalmic ointment ?SMARTSIG:In Eye(s) ?  ?furosemide 20 MG tablet ?Commonly known as: LASIX ?TAKE 1 TABLET BY MOUTH EVERY OTHER DAY AS NEEDED FOR EDEMA ?  ?gabapentin 800 MG tablet ?Commonly known as: NEURONTIN ?TAKE 1 TABLET BY MOUTH 3 TIMES DAILY AS NEEDED AS DIRECTED ?  ?hydrochlorothiazide 25 MG tablet ?Commonly known as: HYDRODIURIL ?TAKE 1 TABLET BY MOUTH ONCE DAILY ?  ?latanoprost  0.005 % ophthalmic solution ?Commonly known as: XALATAN ?Place 1-2 drops into both eyes See admin instructions. Instill 1 drop into left eye at bedtime ?Instill 2 drops into right eye at bedtime ?  ?lisinopril 20 MG tablet ?Commonly known as: ZESTRIL ?TAKE 1 TABLET BY MOUTH ONCE DAILY ?  ?methocarbamol 500 MG tablet ?Commonly known as: ROBAXIN ?Take 1 tablet (500 mg total) by mouth 3 (three) times daily for 21 days. ?  ?omeprazole 20 MG capsule ?Commonly known as: PRILOSEC ?TAKE 1 CAPSULE BY MOUTH ONCE DAILY ?  ?oxyCODONE 5 MG immediate release tablet ?Commonly known as: Oxy IR/ROXICODONE ?Take 1 tablet (5 mg total) by mouth every 4 (four) hours as needed for moderate pain. ?  ?predniSONE 20 MG tablet ?Commonly known as: DELTASONE ?Take 2 tablets (40 mg total) by mouth daily with breakfast for 4 days. ?Start taking on: August 18, 2021 ?  ?rosuvastatin 20 MG tablet ?Commonly known as: CRESTOR ?TAKE 1 TABLET BY MOUTH AT BEDTIME ?  ?SF 5000 Plus 1.1 % Crea dental cream ?Generic drug: sodium fluoride ?Place 1 application onto teeth in the morning and at bedtime. ?  ?varenicline 1 MG tablet ?Commonly known as: CHANTIX ?Take by mouth. ?  ? ?  ? ? ?Allergies  ?Allergen Reactions  ? Bupropion Hcl Other (See Comments)  ?  seizures  ? ? ?Consultations: ?None ? ?Procedures/Studies: ?CT Angio Chest Pulmonary Embolism (PE) W or WO Contrast ? ?Result Date: 08/15/2021 ?CLINICAL DATA:  66 year old male with shortness of breath and elevated D-dimer. EXAM: CT ANGIOGRAPHY CHEST WITH CONTRAST TECHNIQUE: Multidetector CT imaging of the chest was performed using the standard protocol during bolus administration of intravenous contrast. Multiplanar CT image reconstructions and MIPs were obtained to evaluate the vascular anatomy. RADIATION DOSE REDUCTION: This exam was performed according to the departmental dose-optimization program which includes automated exposure control, adjustment of the mA and/or kV according to patient size and/or  use of iterative reconstruction technique. CONTRAST:  53m OMNIPAQUE IOHEXOL 350 MG/ML SOLN COMPARISON:  04/16/2019 CT and prior studies FINDINGS: Cardiovascular: This is a technically satisfactory study. No pulmonary emboli are identified. UPPER limits normal heart size noted. Coronary artery and aortic atherosclerotic calcifications are identified without thoracic aortic aneurysm/dissection. No pericardial effusion. Mediastinum/Nodes: No enlarged mediastinal, hilar, or axillary lymph nodes. Thyroid gland, trachea, and esophagus demonstrate no significant findings. Lungs/Pleura: Patchy ground-glass opacities within the LEFT UPPER lobe and LEFT LOWER lobe are nonspecific but may represent infection. Moderate RIGHT LOWER lung atelectasis noted with possible areas of airspace disease/consolidation. Mild to moderate LEFT LOWER lobe atelectasis identified. Mild centrilobular and paraseptal emphysema again identified. No pleural effusion or pneumothorax. Upper Abdomen: No acute abnormality. Pancreatic calcifications again noted. Musculoskeletal: Severe thoracic scoliosis again noted. No acute or suspicious bony abnormalities identified. Review of the MIP images confirms the above findings. IMPRESSION: 1. No evidence of pulmonary emboli or thoracic aortic aneurysm/dissection. 2. Patchy ground-glass opacities within the LEFT UPPER lobe and LEFT LOWER lobe, nonspecific but may represent infection. 3. Moderate RIGHT LOWER lung atelectasis with  possible areas of airspace disease/consolidation. Mild to moderate LEFT LOWER lobe atelectasis. 4. Aortic Atherosclerosis (ICD10-I70.0) and Emphysema (ICD10-J43.9). Electronically Signed   By: Margarette Canada M.D.   On: 08/15/2021 10:12  ? ?DG Chest Port 1 View ? ?Result Date: 08/14/2021 ?CLINICAL DATA:  Shortness of breath. EXAM: PORTABLE CHEST 1 VIEW COMPARISON:  CT chest with contrast 04/16/2019, PA and lateral 06/29/2015. FINDINGS: There is chronic pleural-parenchymal disease in the  lateral right lower lung field including with small chronic loculated pleural effusion. There is a low inspiration with questionable increased interstitial haziness in the left base which could be atelectas

## 2021-08-17 NOTE — Progress Notes (Signed)
Pt discharged per MD order. IV removed. Discharge instructions reviewed with pt. Pt verbalized understanding. All questions answered to pt satisfaction. Pt wheeled down in wheelchair by volunteer.  ?

## 2021-08-17 NOTE — TOC Initial Note (Signed)
Transition of Care (TOC) - Initial/Assessment Note  ? ? ?Patient Details  ?Name: Lucas Ramirez ?MRN: [id number] ?Date of Birth: Aug 26, 1955 ? ?Transition of Care (TOC) CM/SW Contact:    ?Beverly Sessions, RN ?Phone Number: ?08/17/2021, 8:48 AM ? ?Clinical Narrative:                 ?Admitted for: COPD ?Admitted from: Home with wife ?PCP: Caryn Section ?Pharmacy:[facility]  ?Current home health/prior home health/DME: [facility] and [facility] ? ?Patient agreeable to home health services.  Patient states that he does not have a preference of home health agency.  Referral made and accepted by Tommi Rumps with [facility] Wife to transport at discharge  ? ? ?Expected Discharge Plan: [place] ?Barriers to Discharge: Continued Medical Work up ? ? ?Patient Goals and CMS Choice ?  ?  ?  ? ?Expected Discharge Plan and Services ?Expected Discharge Plan: [place] ?In-house Referral: Clinical Social Work ?  ?Post Acute Care Choice: [place] ?Living arrangements for the past 2 months: [place] (Vanderloop,[name] (Spouse)   [phone] (Mobile)) ?Expected Discharge Date: 08/17/21               ?  ?  ?  ?  ?  ?  ?  ?  ?  ?  ? ?Prior Living Arrangements/Services ?Living arrangements for the past 2 months: [place] (Cobb,[name] (Spouse)   [phone] (Mobile)) ?Lives with:: Spouse ?Patient language and need for interpreter reviewed:: No ?Do you feel safe going back to the place where you live?: Yes      ?Need for Family Participation in Patient Care: Yes (Comment) ?Care giver support system in place?: Yes (comment) ?  ?Criminal Activity/Legal Involvement Pertinent to Current Situation/Hospitalization: No - Comment as needed ? ?Activities of Daily Living ?Home Assistive Devices/Equipment: Cane (specify quad or straight) ?ADL Screening (condition at time of admission) ?Patient's cognitive ability adequate to safely complete daily activities?: Yes ?Is the patient deaf or have difficulty hearing?:  No ?Does the patient have difficulty seeing, even when wearing glasses/contacts?: Yes ?Does the patient have difficulty concentrating, remembering, or making decisions?: No ?Patient able to express need for assistance with ADLs?: Yes ?Does the patient have difficulty dressing or bathing?: No ?Independently performs ADLs?: Yes (appropriate for developmental age) ?Does the patient have difficulty walking or climbing stairs?: Yes ?Weakness of Legs: Both ?Weakness of Arms/Hands: None ? ?Permission Sought/Granted ?Permission sought to share information with : Family Supports ?  ? Share Information with NAME: Kue, Fox (Spouse)   [phone] (Mobile) ?   ?   ?   ? ?Emotional Assessment ?Appearance:: Appears stated age ?Attitude/Demeanor/Rapport: Engaged ?Affect (typically observed): Stable ?Orientation: : Oriented to Self, Oriented to Place, Oriented to  Time, Oriented to Situation ?Alcohol / Substance Use: Not Applicable ?Psych Involvement: No (comment) ? ?Admission diagnosis:  Hyponatremia [E87.1] ?COPD exacerbation ([place]) [J44.1] ?Generalized weakness [R53.1] ?Dyspnea, unspecified type [R06.00] ?Community acquired pneumonia, unspecified laterality [J18.9] ?Patient Active Problem List  ? Diagnosis Date Noted  ? Sepsis due to pneumonia ([place]) 08/14/2021  ? Hyponatremia 08/14/2021  ? Hypertensive urgency 08/14/2021  ? History of CVA (cerebrovascular accident) 08/14/2021  ? Dyslipidemia 08/14/2021  ? Carotid stenosis 09/29/2020  ? Cerebrovascular disease 08/31/2020  ? History of CVA with residual deficit 08/31/2020  ? Edema 08/31/2020  ? Stroke (cerebrum) ([place]) 08/21/2020  ? Visual loss 08/19/2020  ? History of adenomatous polyp of colon 07/05/2019  ? Lymphadenopathy 03/31/2019  ? Vitamin B6 deficiency 11/28/2018  ?  CAP (community acquired pneumonia) 06/28/2015  ? COPD exacerbation ([place]) 06/28/2015  ? HTN (hypertension) 06/28/2015  ? Back pain, chronic 12/24/2014  ? Duodenitis 12/24/2014  ? Esophagitis 12/24/2014  ? H/O  surgical procedure 12/24/2014  ? HLD (hyperlipidemia) 12/24/2014  ? Headache, migraine 12/24/2014  ? Neuropathy 12/24/2014  ? Borderline diabetes 12/24/2014  ? Scoliosis 12/24/2014  ? Compulsive tobacco user syndrome 12/24/2014  ? Vitamin D deficiency 12/24/2014  ? Bell's palsy 12/24/2014  ? Polyneuropathy 12/24/2014  ? ?PCP:  Birdie Sons, MD ?Pharmacy:   ?[facility] [address] ?[address] ?[place] [address] ?Phone: [phone] Fax: [phone] ? ? ? ? ?Social Determinants of Health (SDOH) Interventions ?  ? ?Readmission Risk Interventions ?   ? View : No data to display.  ?  ?  ?  ? ? ? ?

## 2021-08-17 NOTE — Care Management Important Message (Signed)
Important Message ? ?Patient Details  ?Name: Lucas Ramirez ?MRN: [id number] ?Date of Birth: [birth year] ? ? ?Medicare Important Message Given:  Yes ? ? ? ? ?Dannette Barbara ?08/17/2021, 11:00 AM ?

## 2021-08-18 ENCOUNTER — Telehealth: Payer: Self-pay

## 2021-08-18 NOTE — Telephone Encounter (Signed)
Copied from [place] [phone]. Topic: Appointment Scheduling - Scheduling Inquiry for Clinic ?>> Aug 17, 2021  3:11 PM Wynetta Emery, [place] C wrote: ?Reason for CRM: pt called in to schedule a hospital follow up. Not showing opening withing 7-10 days. Please assist pt with scheduling further. ?

## 2021-08-18 NOTE — Telephone Encounter (Signed)
Patient was discharged on 08/17/2021. Per discharge summary needs to follow up with PCP in 1-2 weeks for hospital follow up. Please contact patient and schedule hospital follow up with [facility]. OK to use same day slot for hospital follow up per [facility].  ?

## 2021-08-18 NOTE — Telephone Encounter (Signed)
Transition Care Management Follow-up Telephone Call ?Date of discharge and from where: [facility] 08-17-21 Dx: COPD exac ?How have you been since you were released from the hospital? Doing fine  ?Any questions or concerns? No ? ?Items Reviewed: ?Did the pt receive and understand the discharge instructions provided? Yes  ?Medications obtained and verified? Yes  ?Other? No  ?Any new allergies since your discharge? No  ?Dietary orders reviewed? Yes ?Do you have support at home? Yes  ? ?Home Care and Equipment/Supplies: ?Were home health services ordered? Yes- PT/OT/ nurse/aide ?If so, what is the name of the agency? [facility]   ?Has the agency set up a time to come to the patient's home? yes ?Were any new equipment or medical supplies ordered?  No ?What is the name of the medical supply agency? na ?Were you able to get the supplies/equipment? not applicable ?Do you have any questions related to the use of the equipment or supplies? No ? ?Functional Questionnaire: (I = Independent and D = Dependent) ?ADLs: I ? ?Bathing/Dressing- I ? ?Meal Prep- I ? ?Eating- I ? ?Maintaining continence- I ? ?Transferring/Ambulation- I ? ?Managing Meds- I ? ?Follow up appointments reviewed: ? ?PCP Hospital f/u appt confirmed? Yes  Scheduled to see Dr Caryn Section on 08-30-21 @ 1pm. ?[facility] f/u appt confirmed? No  . ?Are transportation arrangements needed? no ?If their condition worsens, is the pt aware to call PCP or go to the Emergency Dept.? Yes ?Was the patient provided with contact information for the PCP's office or ED? Yes ?Was to pt encouraged to call back with questions or concerns? Yes  ?

## 2021-08-19 LAB — CULTURE, BLOOD (ROUTINE X 2)
Culture: NO GROWTH
Culture: NO GROWTH

## 2021-08-24 ENCOUNTER — Ambulatory Visit: Payer: Self-pay | Admitting: *Deleted

## 2021-08-24 ENCOUNTER — Telehealth: Payer: Self-pay

## 2021-08-24 NOTE — Telephone Encounter (Signed)
Please Review

## 2021-08-24 NOTE — Telephone Encounter (Signed)
Verbal order given  

## 2021-08-24 NOTE — Telephone Encounter (Signed)
?  Summary: P Therapy wants to report to Level 2 drug reactions  ? Pls call back Landry Corporal Skeet Simmer) states there are 2 level 2 drug reactions he wants to report [phone]   ?  ? ? ?Lovie Macadamia, PT with [facility] and PT wanted to report 2 Level 2 possible reactions from medications.  ?Patient was prescribed cefuroxime x 4 days patient started 4/25 and finished 08/21/21. No medication reactions noted at this time taking with omperazole. Another level 2 medications are omperazole and plavix. No reactions reported from PT. PT reports he is letting PCP aware.  ? ? ?Answer Assessment - Initial Assessment Questions ?1. REASON FOR CALL or QUESTION: "What is your reason for calling today?" or "How can I best help you?" or "What question do you have that I can help answer?" ?    Skeet Simmer, PT from [facility] called to report 2 level 2 drug reaction medications patient has been taking . ? ?Protocols used: Information Only Call - No Triage-A-AH ? ?

## 2021-08-24 NOTE — Telephone Encounter (Signed)
That's fine

## 2021-08-24 NOTE — Telephone Encounter (Signed)
Copied from [place] [id number]. Topic: Quick Communication - Home Health Verbal Orders ?>> Aug 24, 2021  9:29 AM Loma Boston wrote: ?Caller/Agency: Skeet Simmer with [facility] [phone] ?Callback Number: [phone] ?Requesting /PT  1 time for 1 wk/   2 time for 3 wk/   1 time for 4 wk ?Frequency: ?

## 2021-08-26 ENCOUNTER — Emergency Department: Payer: Medicare PPO

## 2021-08-26 ENCOUNTER — Emergency Department
Admission: EM | Admit: 2021-08-26 | Discharge: 2021-08-26 | Disposition: A | Discharge status: Home / Self Care | Payer: Medicare PPO | Attending: Emergency Medicine | Admitting: Emergency Medicine

## 2021-08-26 ENCOUNTER — Other Ambulatory Visit: Payer: Self-pay

## 2021-08-26 DIAGNOSIS — I6523 Occlusion and stenosis of bilateral carotid arteries: Secondary | ICD-10-CM | POA: Insufficient documentation

## 2021-08-26 DIAGNOSIS — E860 Dehydration: Secondary | ICD-10-CM | POA: Diagnosis not present

## 2021-08-26 DIAGNOSIS — J449 Chronic obstructive pulmonary disease, unspecified: Secondary | ICD-10-CM | POA: Diagnosis not present

## 2021-08-26 DIAGNOSIS — Z20822 Contact with and (suspected) exposure to covid-19: Secondary | ICD-10-CM | POA: Diagnosis not present

## 2021-08-26 DIAGNOSIS — H3411 Central retinal artery occlusion, right eye: Secondary | ICD-10-CM | POA: Diagnosis not present

## 2021-08-26 DIAGNOSIS — H5461 Unqualified visual loss, right eye, normal vision left eye: Secondary | ICD-10-CM | POA: Diagnosis not present

## 2021-08-26 DIAGNOSIS — R000 Tachycardia, unspecified: Secondary | ICD-10-CM | POA: Insufficient documentation

## 2021-08-26 DIAGNOSIS — R059 Cough, unspecified: Secondary | ICD-10-CM | POA: Diagnosis not present

## 2021-08-26 DIAGNOSIS — I639 Cerebral infarction, unspecified: Secondary | ICD-10-CM | POA: Diagnosis not present

## 2021-08-26 DIAGNOSIS — I951 Orthostatic hypotension: Secondary | ICD-10-CM | POA: Diagnosis not present

## 2021-08-26 DIAGNOSIS — R0902 Hypoxemia: Secondary | ICD-10-CM | POA: Diagnosis not present

## 2021-08-26 DIAGNOSIS — I10 Essential (primary) hypertension: Secondary | ICD-10-CM | POA: Insufficient documentation

## 2021-08-26 DIAGNOSIS — R55 Syncope and collapse: Secondary | ICD-10-CM | POA: Diagnosis not present

## 2021-08-26 DIAGNOSIS — R531 Weakness: Secondary | ICD-10-CM | POA: Diagnosis not present

## 2021-08-26 DIAGNOSIS — R4182 Altered mental status, unspecified: Secondary | ICD-10-CM | POA: Diagnosis not present

## 2021-08-26 DIAGNOSIS — R5383 Other fatigue: Secondary | ICD-10-CM | POA: Diagnosis present

## 2021-08-26 DIAGNOSIS — I959 Hypotension, unspecified: Secondary | ICD-10-CM | POA: Diagnosis not present

## 2021-08-26 DIAGNOSIS — I6503 Occlusion and stenosis of bilateral vertebral arteries: Secondary | ICD-10-CM | POA: Diagnosis not present

## 2021-08-26 LAB — DIFFERENTIAL
Abs Immature Granulocytes: 0.1 10*3/uL — ABNORMAL HIGH (ref 0.00–0.07)
Basophils Absolute: 0 10*3/uL (ref 0.0–0.1)
Basophils Relative: 0 %
Eosinophils Absolute: 0.1 10*3/uL (ref 0.0–0.5)
Eosinophils Relative: 1 %
Immature Granulocytes: 1 %
Lymphocytes Relative: 8 %
Lymphs Abs: 1.2 10*3/uL (ref 0.7–4.0)
Monocytes Absolute: 1.3 10*3/uL — ABNORMAL HIGH (ref 0.1–1.0)
Monocytes Relative: 9 %
Neutro Abs: 12.4 10*3/uL — ABNORMAL HIGH (ref 1.7–7.7)
Neutrophils Relative %: 81 %

## 2021-08-26 LAB — CBC
HCT: 31.8 % — ABNORMAL LOW (ref 39.0–52.0)
Hemoglobin: 10.8 g/dL — ABNORMAL LOW (ref 13.0–17.0)
MCH: 30.2 pg (ref 26.0–34.0)
MCHC: 34 g/dL (ref 30.0–36.0)
MCV: 88.8 fL (ref 80.0–100.0)
Platelets: 469 10*3/uL — ABNORMAL HIGH (ref 150–400)
RBC: 3.58 MIL/uL — ABNORMAL LOW (ref 4.22–5.81)
RDW: 15.5 % (ref 11.5–15.5)
WBC: 15.1 10*3/uL — ABNORMAL HIGH (ref 4.0–10.5)
nRBC: 0 % (ref 0.0–0.2)

## 2021-08-26 LAB — COMPREHENSIVE METABOLIC PANEL
ALT: 33 U/L (ref 0–44)
AST: 20 U/L (ref 15–41)
Albumin: 3 g/dL — ABNORMAL LOW (ref 3.5–5.0)
Alkaline Phosphatase: 64 U/L (ref 38–126)
Anion gap: 9 (ref 5–15)
BUN: 33 mg/dL — ABNORMAL HIGH (ref 8–23)
CO2: 27 mmol/L (ref 22–32)
Calcium: 7.2 mg/dL — ABNORMAL LOW (ref 8.9–10.3)
Chloride: 89 mmol/L — ABNORMAL LOW (ref 98–111)
Creatinine, Ser: 1.4 mg/dL — ABNORMAL HIGH (ref 0.61–1.24)
GFR, Estimated: 56 mL/min — ABNORMAL LOW (ref >60)
Glucose, Bld: 124 mg/dL — ABNORMAL HIGH (ref 70–99)
Potassium: 4 mmol/L (ref 3.5–5.1)
Sodium: 125 mmol/L — ABNORMAL LOW (ref 135–145)
Total Bilirubin: 0.5 mg/dL (ref 0.3–1.2)
Total Protein: 6 g/dL — ABNORMAL LOW (ref 6.5–8.1)

## 2021-08-26 LAB — RESP PANEL BY RT-PCR (FLU A&B, COVID) ARPGX2
Influenza A by PCR: NEGATIVE
Influenza B by PCR: NEGATIVE
SARS Coronavirus 2 by RT PCR: NEGATIVE

## 2021-08-26 LAB — APTT: aPTT: 30 seconds (ref 24–36)

## 2021-08-26 LAB — PROTIME-INR
INR: 0.9 (ref 0.8–1.2)
Prothrombin Time: 12.1 seconds (ref 11.4–15.2)

## 2021-08-26 LAB — PROCALCITONIN: Procalcitonin: 0.15 ng/mL

## 2021-08-26 LAB — CBG MONITORING, ED: Glucose-Capillary: 110 mg/dL — ABNORMAL HIGH (ref 70–99)

## 2021-08-26 LAB — LACTIC ACID, PLASMA: Lactic Acid, Venous: 1.3 mmol/L (ref 0.5–1.9)

## 2021-08-26 MED ORDER — IOHEXOL 350 MG/ML SOLN
75.0000 mL | Freq: Once | INTRAVENOUS | Status: AC | PRN
  Administered 2021-08-26: 75 mL via INTRAVENOUS

## 2021-08-26 MED ORDER — SODIUM CHLORIDE 0.9 % IV BOLUS
1000.0000 mL | Freq: Once | INTRAVENOUS | Status: AC
  Administered 2021-08-26: 1000 mL via INTRAVENOUS

## 2021-08-26 MED ORDER — OXYCODONE HCL 5 MG PO TABS
5.0000 mg | ORAL_TABLET | ORAL | Status: DC | PRN
Start: 2021-08-26 — End: 2021-08-27

## 2021-08-26 MED ORDER — GABAPENTIN 300 MG PO CAPS: 800.0000 mg | ORAL_CAPSULE | Freq: Once | ORAL | Status: DC

## 2021-08-26 MED ORDER — SODIUM CHLORIDE 0.9% FLUSH: 3.0000 mL | Freq: Once | INTRAVENOUS | Status: DC

## 2021-08-26 MED ORDER — SODIUM CHLORIDE 0.9 % IV SOLN: Freq: Once | INTRAVENOUS | Status: AC

## 2021-08-26 MED ORDER — GABAPENTIN 800 MG PO TABS: 800.0000 mg | ORAL_TABLET | ORAL | Status: DC

## 2021-08-26 NOTE — ED Triage Notes (Addendum)
Patient to ER via [facility]. Wife reports that patient started having a decline in mental status at 1430. Wife reports that at baseline patient has difficulty ambulating due to leg pain, but is able to communicate needs. 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  ? ?Patient takes plavix. 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 Unable to have surgeyr.  ? ?Code stroke called by Erlene Quan PA in triage.  ? ?Symptoms: Facial droop, L sided weakness, aphasia.  ?

## 2021-08-26 NOTE — ED Provider Notes (Signed)
? ?[facility] ?Provider Note ? ? ? Event Date/Time  ? First MD Initiated Contact with Patient 08/26/21 1633   ?  (approximate) ? ? ?History  ? ?Altered Mental Status ? ? ?HPI ? ?Lucas Ramirez is a 66 y.o. male with a history of COPD, hypertension who is brought to the ED due to malaise and fatigue since earlier today.  In the waiting room he had an episode of syncope with trying to stand up.  He was noted to have left-sided facial droop and left-sided weakness, but spouse clarifies that left facial droop has been present for years due to Bell's palsy left leg weakness has been chronic as well due to scoliosis correction surgery.  Code stroke was initiated in triage. ?  ? ? ?Physical Exam  ? ?Triage Vital Signs: ?ED Triage Vitals  ?Enc Vitals Group  ?   BP 08/26/21 1615 (!) 68/41  ?   Pulse Rate 08/26/21 1615 96  ?   Resp 08/26/21 1615 18  ?   Temp 08/26/21 1615 98 ?F (36.7 ?C)  ?   Temp src --   ?   SpO2 08/26/21 1615 97 %  ?   Weight --   ?   Height --   ?   Head Circumference --   ?   Peak Flow --   ?   Pain Score 08/26/21 2232 0  ?   Pain Loc --   ?   Pain Edu? --   ?   Excl. in [place]? --   ? ? ?Most recent vital signs: ?Vitals:  ? 08/26/21 2112 08/26/21 2130  ?BP: 106/62 103/63  ?Pulse: (!) 110 (!) 104  ?Resp: 17 18  ?Temp:    ?SpO2: 92% 92%  ? ? ? ?General: Awake, no distress.  ?CV:  Good peripheral perfusion.  Tachycardia heart rate 110 ?Resp:  Normal effort.  Clear to auscultation bilaterally ?Abd:  No distention.  Soft and nontender ?Other:  Dry mucous membranes.  PERRL, EOMI, cranial nerves II through XII intact. ?NIH stroke scale per neurology of 5, nonfocal ? ? ?ED Results / Procedures / Treatments  ? ?Labs ?(all labs ordered are listed, but only abnormal results are displayed) ?Labs Reviewed  ?CBC - Abnormal; Notable for the following components:  ?    Result Value  ? WBC 15.1 (*)   ? RBC 3.58 (*)   ? Hemoglobin 10.8 (*)   ? HCT 31.8 (*)   ? Platelets 469 (*)   ? All other components  within normal limits  ?DIFFERENTIAL - Abnormal; Notable for the following components:  ? Neutro Abs 12.4 (*)   ? Monocytes Absolute 1.3 (*)   ? Abs Immature Granulocytes 0.10 (*)   ? All other components within normal limits  ?COMPREHENSIVE METABOLIC PANEL - Abnormal; Notable for the following components:  ? Sodium 125 (*)   ? Chloride 89 (*)   ? Glucose, Bld 124 (*)   ? BUN 33 (*)   ? Creatinine, Ser 1.40 (*)   ? Calcium 7.2 (*)   ? Total Protein 6.0 (*)   ? Albumin 3.0 (*)   ? GFR, Estimated 56 (*)   ? All other components within normal limits  ?CBG MONITORING, ED - Abnormal; Notable for the following components:  ? Glucose-Capillary 110 (*)   ? All other components within normal limits  ?RESP PANEL BY RT-PCR (FLU A&B, COVID) ARPGX2  ?CULTURE, BLOOD (ROUTINE X 2)  ?CULTURE, BLOOD (ROUTINE  X 2)  ?URINE CULTURE  ?PROTIME-INR  ?APTT  ?LACTIC ACID, PLASMA  ?PROCALCITONIN  ?LACTIC ACID, PLASMA  ?URINALYSIS, COMPLETE (UACMP) WITH MICROSCOPIC  ?I-STAT CREATININE, ED  ?CBG MONITORING, ED  ? ? ? ?EKG ?Interpreted by me ?Sinus rhythm rate of 99.  Normal axis and intervals.  Normal QRS ST segments and T waves. ? ? ? ?RADIOLOGY ? ?Chest x-ray viewed interpreted by me, previously stable compared to previous radiographs, no acute pneumonia pleural effusion pulmonary edema or pneumothorax.  Radiology report reviewed. ?CT head negative for intracranial hemorrhage tumor or obvious stroke.  CT angiogram head and neck negative. ?MRI brain negative ? ? ? ?PROCEDURES: ? ?Critical Care performed: No, ? ?.1-3 Lead EKG Interpretation ?Performed by: Carrie Mew, MD ?Authorized by: Carrie Mew, MD  ? ?  Interpretation: abnormal   ?  ECG rate:  110 ?  ECG rate assessment: tachycardic   ?  Rhythm: sinus rhythm   ?  Ectopy: none   ?  Conduction: normal   ? ? ?MEDICATIONS ORDERED IN ED: ?Medications  ?sodium chloride flush (NS) 0.9 % injection 3 mL (3 mLs Intravenous Not Given 08/26/21 1703)  ?oxyCODONE (Oxy IR/ROXICODONE) immediate  release tablet 5 mg (has no administration in time range)  ?gabapentin (NEURONTIN) capsule 800 mg (800 mg Oral Not Given 08/26/21 2114)  ?iohexol (OMNIPAQUE) 350 MG/ML injection 75 mL (75 mLs Intravenous Contrast Given 08/26/21 1631)  ?0.9 %  sodium chloride infusion (0 mLs Intravenous Stopped 08/26/21 2002)  ?sodium chloride 0.9 % bolus 1,000 mL (0 mLs Intravenous Stopped 08/26/21 2118)  ? ? ? ?IMPRESSION / MDM / ASSESSMENT AND PLAN / ED COURSE  ?I reviewed the triage vital signs and the nursing notes. ?             ?               ? ?Differential diagnosis includes, but is not limited to, intracranial hemorrhage, ischemic stroke, intracranial mass, pneumonia, dehydration, electrolyte normality, vasovagal episode ? ?**The patient is on the cardiac monitor to evaluate for evidence of arrhythmia and/or significant heart rate changes.**} ? ?Clinical Course as of 08/26/21 2232  ?Thu Aug 26, 2021  ?1703 Patient presents with syncope at about 2:30 PM today.  Seen by neurology, and I discussed his presentation with Dr. Malen Gauze.  Left leg weakness and left facial droop are both chronic.  May be vasovagal.  I reviewed prior CT angiogram of the chest which is negative for signs of lung cancer.  I will obtain MRI as recommended by neurology, obtain chest x-ray to follow-up on eradication of previous pneumonia.  Vitals are currently unremarkable, not septic. [PS]  ?2135 Patient reassessed.  After receiving 1.5 L of saline, and vigorously eating a lunch tray, he reports that he feels totally fine.  Orthostatics are negative with standing.  He has some persistent mild tachycardia, but no symptoms at all.  Doubt ACS PE dissection AAA pneumothorax pericarditis infection or sepsis.  Patient wishes to be discharged home and I will think he requires admission, and he has medical decision-making capacity. [PS]  ?  ?Clinical Course User Index ?[PS] Carrie Mew, MD  ? ?Clarification, I do not think he requires admission.  Stable for  discharge. ? ? ?FINAL CLINICAL IMPRESSION(S) / ED DIAGNOSES  ? ?Final diagnoses:  ?Dehydration  ?Orthostatic hypotension  ? ? ? ?Rx / DC Orders  ? ?ED Discharge Orders   ? ? None  ? ?  ? ? ? ?Note:  This document was prepared using Dragon voice recognition software and may include unintentional dictation errors. ?  ?Carrie Mew, MD ?08/26/21 2239 ? ?

## 2021-08-26 NOTE — ED Notes (Signed)
First nurse note-pt brought in via ems from home with near syncopal episode.  No loc. Hx copd. Bp 11/50, p-104, sats 98% fsbs-241 iv in lfa 20g per ems.  Pt alert, in wheelchair in lobby.   ?

## 2021-08-26 NOTE — ED Notes (Signed)
Patient transported to MRI 

## 2021-08-26 NOTE — Consult Note (Addendum)
Neurology Consultation ? ?Reason for Consult: Code stroke for slurred speech left-sided weakness ?Referring Physician: Dr. Joni Fears ? ?CC: Slurred speech left-sided weakness ? ?History is obtained from: Chart, patient's wife, patient's home health aide ? ?HPI: Lucas Ramirez is a 66 y.o. 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  This happened sometime after 1 PM today when the home health aide came in and thought that she saw him maybe taking a nap but he was sort of slumped over.  He did come about but was brought into the hospital for evaluation.  He was evaluated at triage but was able to talk to the staff and a little while after the staff noticed that he is slurring his words and also potentially had a left-sided facial droop.  At that time his systolics were noted to be in 60 to 97s. ?Of note, he has had baseline left-sided Bell's palsy and left facial droop is chronic.  Also has a history of scoliosis and gait dysfunction from lower extremity weakness worse on the left leg than right that is chronic. ?When concern for slurred speech happened and he was examined, he had left-sided weakness, due to the above and a code stroke was called. ?When I examined him in the CT scanner, he had no recollection of the second episode of slurred speech and decreased level of responsiveness but did remember the first event.  Reports that he has not been feeling well since the discharge last week.  Wife reports that his left arm has been acting strange since Friday last week.  He reports a 50 pound weight loss over the last 1 year which she says he has discussed with his primary care but no further  work-up has been advised yet.  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 ? ? ?LKW: Per the ED sometime around 2:30 PM but the left-sided weakness is baseline and the episode of unresponsiveness initially was around 1 PM. ?tpa given?: no, examination nonfocal ?Premorbid modified Rankin scale (mRS): 2 ? ?ROS: Full ROS was performed and is negative except as noted in the HPI.  ?Currently reports feeling general malaise, generalized weakness, some difficulty at times operating his left arm, bilateral lower extremity weakness worse on the left compared to the right which is baseline, 50 pound weight loss, chronic cough. ? ?Past Medical History:  ?Diagnosis Date  ? Bell's palsy   ? CAP (community acquired pneumonia) 06/28/2015  ? COPD (chronic obstructive pulmonary disease) ([place])   ? Depression   ? GERD (gastroesophageal reflux disease)   ? Glaucoma   ? Hyperlipidemia   ? Hypertension   ? Migraine   ? Scoliosis   ? Shingles 2019  ? dx by dermatologist by patient report  ? ?Family History  ?Problem Relation Age of Onset  ? Emphysema Mother   ? Down syndrome Sister   ? CAD Neg Hx   ? Heart disease Neg Hx   ? Colon cancer Neg Hx   ? Prostate cancer Neg Hx   ? ? ?Social History:  ? reports that he has been smoking cigarettes. He has a 3.50 pack-year smoking  history. He has never used smokeless tobacco. He reports that he does not drink alcohol and does not use drugs. ? ?Medications ? ?Current Facility-Administered Medications:  ?  sodium chloride flush (NS) 0.9 % injection 3 mL, 3 mL, Intravenous, Once, Blake Divine, MD ? ?Current Outpatient Medications:  ?  acetaminophen (TYLENOL) 500 MG tablet, Take 1 tablet (500 mg total) by mouth every 6 (six) hours as needed for mild pain., Disp: 30 tablet, Rfl: 0 ?  albuterol (VENTOLIN HFA) 108 (90 Base) MCG/ACT inhaler, Inhale 2 puffs into the lungs every 6 (six) hours as needed for  wheezing or shortness of breath., Disp: 8 g, Rfl: 2 ?  amLODipine (NORVASC) 2.5 MG tablet, TAKE 1 TABLET BY MOUTH ONCE DAILY, Disp: 90 tablet, Rfl: 4 ?  aspirin EC 81 MG EC tablet, Take 1 tablet (81 mg total) by mouth daily. Swallow whole., Disp: , Rfl:  ?  calcium carbonate (OSCAL) 1500 (600 Ca) MG TABS tablet, Take 600 mg of elemental calcium by mouth 2 (two) times daily with a meal., Disp: , Rfl:  ?  cholecalciferol (VITAMIN D) 1000 UNITS tablet, Take 1,000 Units by mouth daily., Disp: , Rfl:  ?  clopidogrel (PLAVIX) 75 MG tablet, TAKE 1 TABLET BY MOUTH ONCE DAILY, Disp: 30 tablet, Rfl: 11 ?  denosumab (PROLIA) 60 MG/ML SOSY injection, Inject 60 mg into the skin every 6 (six) months., Disp: , Rfl:  ?  DULoxetine (CYMBALTA) 20 MG capsule, Take 20 mg by mouth 2 (two) times daily. Morning & afternoon, Disp: , Rfl:  ?  erythromycin ophthalmic ointment, SMARTSIG:In Eye(s), Disp: , Rfl:  ?  furosemide (LASIX) 20 MG tablet, TAKE 1 TABLET BY MOUTH EVERY OTHER DAY AS NEEDED FOR EDEMA, Disp: 30 tablet, Rfl: 3 ?  gabapentin (NEURONTIN) 800 MG tablet, TAKE 1 TABLET BY MOUTH 3 TIMES DAILY AS NEEDED AS DIRECTED, Disp: 270 tablet, Rfl: 4 ?  hydrochlorothiazide (HYDRODIURIL) 25 MG tablet, TAKE 1 TABLET BY MOUTH ONCE DAILY, Disp: 90 tablet, Rfl: 4 ?  latanoprost (XALATAN) 0.005 % ophthalmic solution, Place 1-2 drops into both eyes See admin instructions. Instill 1 drop into left eye at bedtime Instill 2 drops into right eye at bedtime, Disp: , Rfl:  ?  lisinopril (ZESTRIL) 20 MG tablet, TAKE 1 TABLET BY MOUTH ONCE DAILY, Disp: 90 tablet, Rfl: 4 ?  methocarbamol (ROBAXIN) 500 MG tablet, Take 1 tablet (500 mg total) by mouth 3 (three) times daily for 21 days., Disp: 63 tablet, Rfl: 0 ?  omeprazole (PRILOSEC) 20 MG capsule, TAKE 1 CAPSULE BY MOUTH ONCE DAILY, Disp: 30 capsule, Rfl: 11 ?  oxyCODONE (OXY IR/ROXICODONE) 5 MG immediate release tablet, Take 1 tablet (5 mg total) by mouth every 4 (four) hours as needed for moderate pain.,  Disp: 20 tablet, Rfl: 0 ?  rosuvastatin (CRESTOR) 20 MG tablet, TAKE 1 TABLET BY MOUTH AT BEDTIME, Disp: 90 tablet, Rfl: 3 ?  SF 5000 PLUS 1.1 % CREA dental cream, Place 1 application onto teeth in the morning and at bedtime., Disp: , Rfl:  ? ?Exam: ?Current vital signs: ?BP (!) 106/56   Pulse 97   Temp 98 ?F (36.7 ?C)   Resp 17   SpO2 100%  ?Vital signs in last 24 hours: ?Temp:  [98 ?F (36.7 ?C)] 98 ?F (36.7 ?C) (05/04 1615) ?Pulse Rate:  [96-98] 97 (05/04 1642) ?Resp:  [15-18] 17 (05/04 1642) ?BP: (68-106)/(41-56) 106/56 (05/04 1640) ?SpO2:  [97 %-100 %] 100 % (05/04 1642) ?General: Somewhat pale  and cachectic looking man. ?HEENT: Normocephalic atraumatic ?Lungs: Scattered rales ?CVS: Regular rhythm ?Abdomen: Due to the scoliosis more abdominal bulge on the left ?Extremities pitting edema ?Neurological exam ?Awake alert oriented x3 ?Speech is mildly dysarthric but according to family that is his baseline ?No aphasia ?Cranial nerves: Right I cannot be examined due to sutures, left eye with no restricted range of motion, round reactive pupil and no field deficit.  Asymmetry of the face with left lower facial droop most prominent at rest from prior Bell's palsy.  Facial sensation intact. ?Motor examination with no drift in the upper extremities and drift in both lower extremities with the left leg hitting the bed before 5 seconds in the right leg just drifting without hitting the bed. ?Sensation intact ?Coordination with no gross dysmetria ?- Stroke scale ?1a Level of Conscious.: 0 ?1b LOC Questions: 0 ?1c LOC Commands: 0 ?2 Best Gaze: 0 ?3 Visual: 0 ?4 Facial Palsy: 1 ?5a Motor Arm - left: 0 ?5b Motor Arm - Right:  ?6a Motor Leg - Left: 2 ?6b Motor Leg - Right: 1 ?7 Limb Ataxia: 0 ?8 Sensory: 0 ?9 Best Language: 0 ?10 Dysarthria: 1 ?11 Extinct. and Inatten.: 0 ?TOTAL: 5 ? ?Labs ?I have reviewed labs in epic and the results pertinent to this consultation are: ? ? ?CBC ?   ?Component Value Date/Time  ? WBC 10.2  08/14/2021 0518  ? RBC 4.07 (L) 08/14/2021 0518  ? HGB 12.0 (L) 08/14/2021 0518  ? HGB 12.6 (L) 07/02/2021 1507  ? HCT 34.9 (L) 08/14/2021 0518  ? HCT 37.8 07/02/2021 1507  ? PLT 400 08/14/2021 0518  ? PLT 3

## 2021-08-30 ENCOUNTER — Telehealth: Payer: Self-pay | Admitting: Family Medicine

## 2021-08-30 ENCOUNTER — Ambulatory Visit (INDEPENDENT_AMBULATORY_CARE_PROVIDER_SITE_OTHER): Payer: Medicare PPO | Admitting: Family Medicine

## 2021-08-30 ENCOUNTER — Encounter: Payer: Self-pay | Admitting: Family Medicine

## 2021-08-30 VITALS — BP 148/82 | HR 100 | Temp 98.1°F | Resp 16 | Wt 117.6 lb

## 2021-08-30 DIAGNOSIS — G4700 Insomnia, unspecified: Secondary | ICD-10-CM | POA: Diagnosis not present

## 2021-08-30 DIAGNOSIS — G510 Bell's palsy: Secondary | ICD-10-CM | POA: Diagnosis not present

## 2021-08-30 DIAGNOSIS — I10 Essential (primary) hypertension: Secondary | ICD-10-CM | POA: Diagnosis not present

## 2021-08-30 DIAGNOSIS — E785 Hyperlipidemia, unspecified: Secondary | ICD-10-CM | POA: Diagnosis not present

## 2021-08-30 DIAGNOSIS — F32A Depression, unspecified: Secondary | ICD-10-CM

## 2021-08-30 DIAGNOSIS — I959 Hypotension, unspecified: Secondary | ICD-10-CM

## 2021-08-30 DIAGNOSIS — R634 Abnormal weight loss: Secondary | ICD-10-CM

## 2021-08-30 DIAGNOSIS — I700 Atherosclerosis of aorta: Secondary | ICD-10-CM | POA: Diagnosis not present

## 2021-08-30 DIAGNOSIS — I2510 Atherosclerotic heart disease of native coronary artery without angina pectoris: Secondary | ICD-10-CM | POA: Diagnosis not present

## 2021-08-30 DIAGNOSIS — J432 Centrilobular emphysema: Secondary | ICD-10-CM | POA: Diagnosis not present

## 2021-08-30 DIAGNOSIS — M170 Bilateral primary osteoarthritis of knee: Secondary | ICD-10-CM | POA: Diagnosis not present

## 2021-08-30 DIAGNOSIS — E46 Unspecified protein-calorie malnutrition: Secondary | ICD-10-CM

## 2021-08-30 DIAGNOSIS — J189 Pneumonia, unspecified organism: Secondary | ICD-10-CM | POA: Diagnosis not present

## 2021-08-30 DIAGNOSIS — J9811 Atelectasis: Secondary | ICD-10-CM | POA: Diagnosis not present

## 2021-08-30 MED ORDER — OXYCODONE HCL 5 MG PO TABS: 5.0000 mg | ORAL_TABLET | ORAL | 0 refills | Status: DC | PRN

## 2021-08-30 MED ORDER — MIRTAZAPINE 15 MG PO TBDP: 7.5000 mg | ORAL_TABLET | Freq: Every day | ORAL | 1 refills | Status: DC

## 2021-08-30 MED ORDER — LISINOPRIL 20 MG PO TABS: 10.0000 mg | ORAL_TABLET | Freq: Every day | ORAL | Status: DC

## 2021-08-30 NOTE — Patient Instructions (Signed)
Please review the attached list of medications and notify my office if there are any errors.  ? ?Please bring all of your medications to every appointment so we can make sure that our medication list is the same as yours.  ? ?Start taking lisinopril '20mg'$  by just taking 1/2 tablet every day ?

## 2021-08-30 NOTE — Progress Notes (Signed)
I,Joseline E Rosas,acting as a scribe for Lelon Huh, MD.,have documented all relevant documentation on the behalf of Lelon Huh, MD,as directed by  Lelon Huh, MD while in the presence of Lelon Huh, MD.   Established patient visit   Patient: Lucas Ramirez   DOB: 1955/10/07   66 y.o. Male  MRN: [id number] Visit Date: 08/30/2021  Today's healthcare provider: Lelon Huh, MD   Chief Complaint  Patient presents with   ER follow-up   Subjective    HPI  Follow up ER visit  Patient was seen in ER for dehydration and orthostatic hypotension on 08/26/2021. Treatment for this included IV fluids. He reports good compliance with treatment. He reports this condition is Improved.  Since then he stopped all three of his blood pressure medications of lisinopril, hctz and amlodipine, although he did take 1 dose of hctz yesterday due to feet swelling.   He is also noted to have persistent weight loss. He was seen for this in march and had normal. TSH, PSA, CBC and CMP. He was advised to stop suboxone due to possible side effect of weight loss, and changed to hydrocodone/apap, which was subsequently change to oxycodone at his ER visit. However he had lost a few more pounds since then. Denies any GI symptoms.   Wt Readings from Last 5 Encounters:  08/30/21 117 lb 9.6 oz (53.3 kg)  08/13/21 120 lb (54.4 kg)  07/02/21 119 lb (54 kg)  06/29/21 119 lb (54 kg)  12/29/20 128 lb (58.1 kg)    -----------------------------------------------------------------------------------------   Medications: Outpatient Medications Prior to Visit  Medication Sig   acetaminophen (TYLENOL) 500 MG tablet Take 1 tablet (500 mg total) by mouth every 6 (six) hours as needed for mild pain.   albuterol (VENTOLIN HFA) 108 (90 Base) MCG/ACT inhaler Inhale 2 puffs into the lungs every 6 (six) hours as needed for wheezing or shortness of breath.   aspirin EC 81 MG EC tablet Take 1 tablet (81 mg total) by  mouth daily. Swallow whole.   calcium carbonate (OSCAL) 1500 (600 Ca) MG TABS tablet Take 600 mg of elemental calcium by mouth 2 (two) times daily with a meal.   cholecalciferol (VITAMIN D) 1000 UNITS tablet Take 1,000 Units by mouth daily.   clopidogrel (PLAVIX) 75 MG tablet TAKE 1 TABLET BY MOUTH ONCE DAILY   denosumab (PROLIA) 60 MG/ML SOSY injection Inject 60 mg into the skin every 6 (six) months.   erythromycin ophthalmic ointment Place 1 application. into both eyes at bedtime.   furosemide (LASIX) 20 MG tablet TAKE 1 TABLET BY MOUTH EVERY OTHER DAY AS NEEDED FOR EDEMA   gabapentin (NEURONTIN) 800 MG tablet TAKE 1 TABLET BY MOUTH 3 TIMES DAILY AS NEEDED AS DIRECTED   hydrochlorothiazide (HYDRODIURIL) 25 MG tablet TAKE 1 TABLET BY MOUTH ONCE DAILY   latanoprost (XALATAN) 0.005 % ophthalmic solution Place 1-2 drops into both eyes See admin instructions. Instill 1 drop into left eye at bedtime Instill 2 drops into right eye at bedtime   lisinopril (ZESTRIL) 20 MG tablet TAKE 1 TABLET BY MOUTH ONCE DAILY   methocarbamol (ROBAXIN) 500 MG tablet Take 1 tablet (500 mg total) by mouth 3 (three) times daily for 21 days.   omeprazole (PRILOSEC) 20 MG capsule TAKE 1 CAPSULE BY MOUTH ONCE DAILY   rosuvastatin (CRESTOR) 20 MG tablet TAKE 1 TABLET BY MOUTH AT BEDTIME   SF 5000 PLUS 1.1 % CREA dental cream Place 1 application onto teeth  in the morning and at bedtime.   amLODipine (NORVASC) 2.5 MG tablet TAKE 1 TABLET BY MOUTH ONCE DAILY (Patient not taking: Reported on 08/30/2021)   DULoxetine (CYMBALTA) 20 MG capsule Take 20 mg by mouth 2 (two) times daily. Morning & afternoon (Patient not taking: Reported on 08/30/2021)   oxyCODONE (OXY IR/ROXICODONE) 5 MG immediate release tablet Take 1 tablet (5 mg total) by mouth every 4 (four) hours as needed for moderate pain. (Patient not taking: Reported on 08/30/2021)   No facility-administered medications prior to visit.         Objective    BP (!) 148/82 (BP  Location: Left Arm, Patient Position: Sitting, Cuff Size: Small)   Pulse 100   Temp 98.1 F (36.7 C) (Oral)   Resp 16   Wt 117 lb 9.6 oz (53.3 kg)   SpO2 98%   BMI 18.98 kg/m    Physical Exam   General: Appearance:    Thin male in no acute distress  Eyes:    PERRL, conjunctiva/corneas clear, EOM's intact       Lungs:     Clear to auscultation bilaterally, respirations unlabored  Heart:    Tachycardic. Normal rhythm. No murmurs, rubs, or gallops.    MS:   All extremities are intact.    Neurologic:   Awake, alert, oriented x 3. No apparent focal neurological defect.         Assessment & Plan     1. Hypotension, unspecified hypotension type Has stoppd all three BP medications since ER visit last week.   2. Essential hypertension Resume only lisinopril (ZESTRIL) 20 MG tablet; but only takes 0.5 tablets (10 mg total) by mouth daily.  3. Protein-calorie malnutrition, unspecified severity ([place])  - US Abdomen Complete; Future  4. Abnormal weight loss  - US Abdomen Complete; Future  5. Insomnia, unspecified type Start mirtazapine (REMERON SOL-TAB) 15 MG disintegrating tablet; Take 0.5-1 tablets (7.5-15 mg total) by mouth at bedtime.  Dispense: 30 tablet; Refill: 1       The entirety of the information documented in the History of Present Illness, Review of Systems and Physical Exam were personally obtained by me. Portions of this information were initially documented by the CMA and reviewed by me for thoroughness and accuracy.     Lelon Huh, MD  [facility] [phone] (phone) [phone] (fax)  [place]

## 2021-08-30 NOTE — Telephone Encounter (Signed)
Cesar with [facility] is seeing the pt today.  She states pt is not taking his bp meds. ?Pt went to ER last Thursday b/c he passed out.  ? ?Pt has appt today at 1pm w/ Dr Caryn Section ? ?Bp 130/80 ? ?Cb [phone] ?

## 2021-08-31 LAB — CULTURE, BLOOD (ROUTINE X 2)
Culture: NO GROWTH
Culture: NO GROWTH

## 2021-09-02 ENCOUNTER — Ambulatory Visit
Admission: RE | Admit: 2021-09-02 | Discharge: 2021-09-02 | Disposition: A | Discharge status: Home / Self Care | Payer: Medicare PPO | Source: Ambulatory Visit | Attending: Family Medicine | Admitting: Family Medicine

## 2021-09-02 DIAGNOSIS — R634 Abnormal weight loss: Secondary | ICD-10-CM

## 2021-09-02 DIAGNOSIS — E46 Unspecified protein-calorie malnutrition: Secondary | ICD-10-CM | POA: Diagnosis not present

## 2021-09-02 DIAGNOSIS — I700 Atherosclerosis of aorta: Secondary | ICD-10-CM | POA: Diagnosis not present

## 2021-09-02 DIAGNOSIS — K838 Other specified diseases of biliary tract: Secondary | ICD-10-CM | POA: Diagnosis not present

## 2021-09-02 DIAGNOSIS — N281 Cyst of kidney, acquired: Secondary | ICD-10-CM | POA: Diagnosis not present

## 2021-09-07 ENCOUNTER — Other Ambulatory Visit: Payer: Self-pay | Admitting: Family Medicine

## 2021-09-07 DIAGNOSIS — E46 Unspecified protein-calorie malnutrition: Secondary | ICD-10-CM

## 2021-09-07 DIAGNOSIS — R634 Abnormal weight loss: Secondary | ICD-10-CM

## 2021-09-08 ENCOUNTER — Other Ambulatory Visit: Payer: Self-pay | Admitting: Family Medicine

## 2021-09-08 NOTE — Telephone Encounter (Signed)
Requested medications are due for refill today.  unsure ? ?Requested medications are on the active medications list.  yes ? ?Last refill. 08/30/2021 #60 0 refills ? ?Future visit scheduled.   no ? ?Notes to clinic.  Medication refill is not delegated. ? ? ? ?Requested Prescriptions  ?Pending Prescriptions Disp Refills  ? oxyCODONE (OXY IR/ROXICODONE) 5 MG immediate release tablet [Pharmacy Med Name: OXYCODONE HCL 5 MG TAB] 60 tablet   ?  Sig: TAKE 1 TABLET BY MOUTH EVERY 4 HOURS AS NEEDED FOR MODERATE PAIN  ?  ? Not Delegated - Analgesics:  Opioid Agonists Failed - 09/08/2021 12:09 PM  ?  ?  Failed - This refill cannot be delegated  ?  ?  Failed - Urine Drug Screen completed in last 360 days  ?  ?  Passed - Valid encounter within last 3 months  ?  Recent Outpatient Visits   ? ?      ? 1 week ago Hypotension, unspecified hypotension type  ? [facility] Caryn Section, Kirstie Peri, MD  ? 2 months ago Prostate cancer screening  ? [facility] Birdie Sons, MD  ? 9 months ago Chronic right-sided low back pain with right-sided sciatica  ? [facility] Birdie Sons, MD  ? 11 months ago Hyponatremia  ? [facility] Caryn Section, Kirstie Peri, MD  ? 1 year ago Cerebrovascular disease  ? [facility] Caryn Section, Kirstie Peri, MD  ? ?  ?  ? ? ?  ?  ?  ?  ?

## 2021-09-16 ENCOUNTER — Other Ambulatory Visit: Payer: Self-pay | Admitting: Family Medicine

## 2021-09-17 ENCOUNTER — Other Ambulatory Visit: Payer: Self-pay | Admitting: Family Medicine

## 2021-09-18 ENCOUNTER — Other Ambulatory Visit: Payer: Self-pay | Admitting: Family Medicine

## 2021-09-18 ENCOUNTER — Encounter: Payer: Self-pay | Admitting: Family Medicine

## 2021-09-20 ENCOUNTER — Other Ambulatory Visit: Payer: Self-pay | Admitting: Family Medicine

## 2021-09-23 ENCOUNTER — Other Ambulatory Visit: Payer: Self-pay | Admitting: Family Medicine

## 2021-09-23 ENCOUNTER — Ambulatory Visit
Admission: RE | Admit: 2021-09-23 | Discharge: 2021-09-23 | Disposition: A | Discharge status: Home / Self Care | Payer: Medicare PPO | Source: Ambulatory Visit | Attending: Family Medicine | Admitting: Family Medicine

## 2021-09-23 DIAGNOSIS — E46 Unspecified protein-calorie malnutrition: Secondary | ICD-10-CM | POA: Insufficient documentation

## 2021-09-23 DIAGNOSIS — K862 Cyst of pancreas: Secondary | ICD-10-CM | POA: Diagnosis not present

## 2021-09-23 DIAGNOSIS — N281 Cyst of kidney, acquired: Secondary | ICD-10-CM | POA: Diagnosis not present

## 2021-09-23 DIAGNOSIS — R634 Abnormal weight loss: Secondary | ICD-10-CM

## 2021-09-23 DIAGNOSIS — K861 Other chronic pancreatitis: Secondary | ICD-10-CM | POA: Diagnosis not present

## 2021-09-23 DIAGNOSIS — I959 Hypotension, unspecified: Secondary | ICD-10-CM

## 2021-09-23 MED ORDER — GADOBUTROL 1 MMOL/ML IV SOLN
5.0000 mL | Freq: Once | INTRAVENOUS | Status: AC | PRN
  Administered 2021-09-23: 5 mL via INTRAVENOUS

## 2021-09-28 ENCOUNTER — Other Ambulatory Visit: Payer: Self-pay | Admitting: Family Medicine

## 2021-09-30 ENCOUNTER — Other Ambulatory Visit: Payer: Self-pay | Admitting: Family Medicine

## 2021-09-30 ENCOUNTER — Other Ambulatory Visit: Payer: Self-pay

## 2021-09-30 ENCOUNTER — Institutional Professional Consult (permissible substitution): Payer: Medicare PPO | Admitting: Pulmonary Disease

## 2021-09-30 MED ORDER — OXYCODONE HCL 5 MG PO TABS: ORAL_TABLET | ORAL | 0 refills | Status: DC

## 2021-09-30 NOTE — Telephone Encounter (Signed)
Spoke with patient to see what questions he has. Per patient no questions he just need a refill.

## 2021-09-30 NOTE — Telephone Encounter (Signed)
Copied from [id number]. Topic: General - Other >> Sep 30, 2021 11:51 AM Everette C wrote: Reason for CRM: The patient would like to speak with a member of clinical staff when possible  The patient has additional questions related to refills of their oxyCODONE (OXY IR/ROXICODONE) 5 MG immediate release tablet [[id number]]   Please contact when available

## 2021-09-30 NOTE — Telephone Encounter (Signed)
Copied from [place] [phone] [id number]. Topic: General - Other >> Sep 30, 2021 11:49 AM Everette C wrote: Reason for CRM: Medication Refill - Medication: oxyCODONE (OXY IR/ROXICODONE) 5 MG immediate release tablet [[id number]] - patient has 0 tablets remaining   Has the patient contacted their pharmacy? no.   (Agent: If no, request that the patient contact the pharmacy for the refill. If patient does not wish to contact the pharmacy document the reason why and proceed with request.) (Agent: If yes, when and what did the pharmacy advise?)  Preferred Pharmacy (with phone number or street name): [facility] [place], [place] [address] Phone: [phone] Fax: [phone] Hours: Not open 24 hours   Has the patient been seen for an appointment in the last year OR does the patient have an upcoming appointment? Yes.    Agent: Please be advised that RX refills may take up to 3 business days. We ask that you follow-up with your pharmacy.

## 2021-09-30 NOTE — Telephone Encounter (Signed)
Requested medication (s) are due for refill today:Yes  Requested medication (s) are on the active medication list:Yes  Last refill:  09/20/21  Future visit scheduled: No  Notes to clinic:  Unable to refill per protocol, cannot delegate.      Requested Prescriptions  Pending Prescriptions Disp Refills   oxyCODONE (OXY IR/ROXICODONE) 5 MG immediate release tablet 60 tablet 0    Sig: TAKE 1 TABLET BY MOUTH EVERY 4 HOURS AS NEEDED FOR MODERATE PAIN     Not Delegated - Analgesics:  Opioid Agonists Failed - 09/30/2021 11:56 AM      Failed - This refill cannot be delegated      Failed - Urine Drug Screen completed in last 360 days      Passed - Valid encounter within last 3 months    Recent Outpatient Visits           1 month ago Hypotension, unspecified hypotension type   [facility] Birdie Sons, MD   3 months ago Prostate cancer screening   [facility] Birdie Sons, MD   10 months ago Chronic right-sided low back pain with right-sided sciatica   [facility] Birdie Sons, MD   11 months ago Hyponatremia   [facility] Birdie Sons, MD   1 year ago Cerebrovascular disease   [place], Kirstie Peri, MD

## 2021-10-07 ENCOUNTER — Other Ambulatory Visit: Payer: Self-pay | Admitting: Family Medicine

## 2021-10-08 ENCOUNTER — Encounter: Payer: Self-pay | Admitting: Family Medicine

## 2021-10-18 ENCOUNTER — Other Ambulatory Visit: Payer: Self-pay | Admitting: Family Medicine

## 2021-10-18 DIAGNOSIS — R609 Edema, unspecified: Secondary | ICD-10-CM

## 2021-10-18 DIAGNOSIS — G4700 Insomnia, unspecified: Secondary | ICD-10-CM

## 2021-10-27 ENCOUNTER — Other Ambulatory Visit: Payer: Self-pay | Admitting: Family Medicine

## 2021-11-03 ENCOUNTER — Other Ambulatory Visit: Payer: Self-pay | Admitting: Family Medicine

## 2021-11-05 NOTE — Telephone Encounter (Signed)
I called patient and scheduled follow appointment for Monday 11/05/2021 with Dr. Caryn Section. Patient says he will run out of medication tomorrow. Please review refill request.

## 2021-11-05 NOTE — Telephone Encounter (Signed)
This med has not been filled yet due to MME requirements per pharmacy:    important  Maximum MME cannot be calculated for this prescription. Enter discrete sig details to calculate maximum MME.      Notes to clinic:  It keeps getting refused due to already sent, pharm did receive but unable to fill due to requirements asked. Please assess.      Requested Prescriptions  Pending Prescriptions Disp Refills   oxyCODONE (OXY IR/ROXICODONE) 5 MG immediate release tablet [Pharmacy Med Name: OXYCODONE HCL 5 MG TAB] 60 tablet     Sig: TAKE 1 TABLET BY MOUTH EVERY 4 HOURS AS NEEDED FOR MODERATE PAIN     Not Delegated - Analgesics:  Opioid Agonists Failed - 11/05/2021  1:55 PM      Failed - This refill cannot be delegated      Failed - Urine Drug Screen completed in last 360 days      Passed - Valid encounter within last 3 months    Recent Outpatient Visits           2 months ago Hypotension, unspecified hypotension type   [facility] Birdie Sons, MD   4 months ago Prostate cancer screening   [facility] Birdie Sons, MD   11 months ago Chronic right-sided low back pain with right-sided sciatica   [facility] Birdie Sons, MD   1 year ago Hyponatremia   [facility] Birdie Sons, MD   1 year ago Cerebrovascular disease   [place], Kirstie Peri, MD

## 2021-11-05 NOTE — Telephone Encounter (Signed)
Pt is calling check on the status of his prescription. He states he has one pill left.

## 2021-11-05 NOTE — Telephone Encounter (Signed)
Requested medication (s) are due for refill today - provider review   Requested medication (s) are on the active medication list -yes  Future visit scheduled -no  Last refill: 10/28/21 #60  Notes to clinic: Last RF states patient needs appointment, Rx has not been filled-see previous nurse's note- refused due to maximum MME can not be calculated for this prescription- please review   Requested Prescriptions  Pending Prescriptions Disp Refills   oxyCODONE (OXY IR/ROXICODONE) 5 MG immediate release tablet [Pharmacy Med Name: OXYCODONE HCL 5 MG TAB] 60 tablet     Sig: TAKE 1 TABLET BY MOUTH EVERY 4 HOURS AS NEEDED FOR MODERATE PAIN     Not Delegated - Analgesics:  Opioid Agonists Failed - 11/05/2021  1:55 PM      Failed - This refill cannot be delegated      Failed - Urine Drug Screen completed in last 360 days      Passed - Valid encounter within last 3 months    Recent Outpatient Visits           2 months ago Hypotension, unspecified hypotension type   [facility] Birdie Sons, MD   4 months ago Prostate cancer screening   [facility] Birdie Sons, MD   11 months ago Chronic right-sided low back pain with right-sided sciatica   [facility] Birdie Sons, MD   1 year ago Hyponatremia   [facility] Birdie Sons, MD   1 year ago Cerebrovascular disease   [facility] Birdie Sons, MD                 Requested Prescriptions  Pending Prescriptions Disp Refills   oxyCODONE (OXY IR/ROXICODONE) 5 MG immediate release tablet [Pharmacy Med Name: OXYCODONE HCL 5 MG TAB] 60 tablet     Sig: TAKE 1 TABLET BY MOUTH EVERY 4 HOURS AS NEEDED FOR MODERATE PAIN     Not Delegated - Analgesics:  Opioid Agonists Failed - 11/05/2021  1:55 PM      Failed - This refill cannot be delegated      Failed - Urine Drug Screen completed in last 360 days      Passed - Valid encounter within last 3 months     Recent Outpatient Visits           2 months ago Hypotension, unspecified hypotension type   [facility] Birdie Sons, MD   4 months ago Prostate cancer screening   [facility] Birdie Sons, MD   11 months ago Chronic right-sided low back pain with right-sided sciatica   [facility] Birdie Sons, MD   1 year ago Hyponatremia   [facility] Birdie Sons, MD   1 year ago Cerebrovascular disease   [place], Kirstie Peri, MD

## 2021-11-08 ENCOUNTER — Ambulatory Visit: Payer: Medicare PPO | Admitting: Family Medicine

## 2021-11-08 ENCOUNTER — Ambulatory Visit: Payer: Self-pay | Admitting: *Deleted

## 2021-11-08 NOTE — Telephone Encounter (Signed)
R knee and foot Intense to not intense Comes and goes- moderate to severe Chronic ongoing Yes- patient was given pain medication- was seeing neurology- docteo has left practice and not established with another provider Chronic None

## 2021-11-08 NOTE — Telephone Encounter (Signed)
  Chief Complaint: R knee pain- medication request Symptoms: pain in knee and foot Frequency: chronic Pertinent Negatives: Patient denies   Disposition: '[]'$ ED /'[]'$ Urgent Care (no appt availability in office) / '[x]'$ Appointment(In office/virtual)/ '[]'$  [place] [facility]/ '[]'$ Home Care/ '[]'$ Refused Recommended Disposition /'[]'$ [place] [facility]/ '[]'$  Follow-up with PCP Additional Notes: Patient states he was on narcotic and has been denied for need of appointment- lost neurologist and recently and has not reestablished yet.

## 2021-11-08 NOTE — Telephone Encounter (Signed)
Please advise 

## 2021-11-08 NOTE — Telephone Encounter (Signed)
FYI

## 2021-11-09 ENCOUNTER — Encounter (INDEPENDENT_AMBULATORY_CARE_PROVIDER_SITE_OTHER): Payer: Medicare PPO | Admitting: Family Medicine

## 2021-11-09 NOTE — Progress Notes (Deleted)
      Established patient visit   Patient: Lucas Ramirez   DOB: [birth year]   66 y.o. Male  MRN: [id number] Visit Date: 11/09/2021  Today's healthcare provider: Wilhemena Durie, MD   No chief complaint on file.  Subjective    Knee Pain  The pain is present in the right knee and right foot. The pain has been Intermittent (chronic pain) since onset. Treatments tried: oxycodone.      Medications: Outpatient Medications Prior to Visit  Medication Sig   acetaminophen (TYLENOL) 500 MG tablet Take 1 tablet (500 mg total) by mouth every 6 (six) hours as needed for mild pain.   albuterol (VENTOLIN HFA) 108 (90 Base) MCG/ACT inhaler Inhale 2 puffs into the lungs every 6 (six) hours as needed for wheezing or shortness of breath.   aspirin EC 81 MG EC tablet Take 1 tablet (81 mg total) by mouth daily. Swallow whole.   calcium carbonate (OSCAL) 1500 (600 Ca) MG TABS tablet Take 600 mg of elemental calcium by mouth 2 (two) times daily with a meal.   cholecalciferol (VITAMIN D) 1000 UNITS tablet Take 1,000 Units by mouth daily.   clopidogrel (PLAVIX) 75 MG tablet TAKE 1 TABLET BY MOUTH ONCE DAILY   denosumab (PROLIA) 60 MG/ML SOSY injection Inject 60 mg into the skin every 6 (six) months.   DULoxetine (CYMBALTA) 20 MG capsule Take 20 mg by mouth 2 (two) times daily. Morning & afternoon (Patient not taking: Reported on 08/30/2021)   erythromycin ophthalmic ointment Place 1 application. into both eyes at bedtime.   furosemide (LASIX) 20 MG tablet TAKE 1 TABLET BY MOUTH EVERY OTHER DAY AS NEEDED FOR EDEMA   gabapentin (NEURONTIN) 800 MG tablet TAKE 1 TABLET BY MOUTH 3 TIMES DAILY AS NEEDED AS DIRECTED   latanoprost (XALATAN) 0.005 % ophthalmic solution Place 1-2 drops into both eyes See admin instructions. Instill 1 drop into left eye at bedtime Instill 2 drops into right eye at bedtime   lisinopril (ZESTRIL) 20 MG tablet Take 0.5 tablets (10 mg total) by mouth daily.   mirtazapine (REMERON) 15 MG  tablet TAKE 1/2-1 TABLET BY MOUTH AT BEDTIME   omeprazole (PRILOSEC) 20 MG capsule TAKE 1 CAPSULE BY MOUTH ONCE DAILY   oxyCODONE (OXY IR/ROXICODONE) 5 MG immediate release tablet TAKE 1 TABLET BY MOUTH EVERY 4 HOURS AS NEEDED FOR MODERATE PAIN   rosuvastatin (CRESTOR) 20 MG tablet TAKE 1 TABLET BY MOUTH AT BEDTIME   SF 5000 PLUS 1.1 % CREA dental cream Place 1 application onto teeth in the morning and at bedtime.   No facility-administered medications prior to visit.    Review of Systems  {Labs  Heme  Chem  Endocrine  Serology  Results Review (optional):23779}   Objective    There were no vitals taken for this visit. {Show previous vital signs (optional):23777}  Physical Exam  ***  No results found for any visits on 11/09/21.  Assessment & Plan     ***  No follow-ups on file.      {provider attestation***:1}   Wilhemena Durie, MD  [facility] [phone] (phone) [phone] (fax)  [place]

## 2021-11-15 ENCOUNTER — Other Ambulatory Visit: Payer: Self-pay | Admitting: Family Medicine

## 2021-11-16 ENCOUNTER — Other Ambulatory Visit: Payer: Self-pay

## 2021-11-16 ENCOUNTER — Encounter: Payer: Self-pay | Admitting: Family Medicine

## 2021-11-16 ENCOUNTER — Other Ambulatory Visit: Payer: Self-pay | Admitting: Family Medicine

## 2021-11-16 MED ORDER — OXYCODONE HCL 5 MG PO TABS
5.0000 mg | ORAL_TABLET | ORAL | 0 refills | Status: DC
Start: 2021-11-16 — End: 2021-12-14

## 2021-11-16 NOTE — Telephone Encounter (Signed)
Copied from [place] [phone]. Topic: Appointment Scheduling - Scheduling Inquiry for Clinic >> Nov 16, 2021  2:22 PM Rosanne Ashing P wrote: Reason for CRM: Pt called asking about his Oxycodone being filled tomorrow.  He will be completely out on Thursday.  Dr. Caryn Section does not have an opening on the schd until Sept and the patient had an appt in Sept/  He gets 10 day refills ata time on the Oxycodone '5mg'$ .  [facility] is his pharmacy  [phone]

## 2021-11-16 NOTE — Telephone Encounter (Signed)
Last refill: 11/07/2021 #60 with 0 refills  Last office visit: 08/30/2021 for other medical issues Next office visit:  12/24/2021

## 2021-11-16 NOTE — Addendum Note (Signed)
Addended by: Randal Buba on: 11/16/2021 04:12 PM   Modules accepted: Orders

## 2021-11-24 ENCOUNTER — Ambulatory Visit: Payer: Medicare PPO | Admitting: Family Medicine

## 2021-12-13 ENCOUNTER — Other Ambulatory Visit: Payer: Self-pay | Admitting: Family Medicine

## 2021-12-13 NOTE — Telephone Encounter (Signed)
Medication Refill - Medication: oxyCODONE (OXY IR/ROXICODONE) 5 MG immediate release tablet   Has the patient contacted their pharmacy? No because of narcotic drug (Agent: If no, request that the patient contact the pharmacy for the refill. If patient does not wish to contact the pharmacy document the reason why and proceed with request.) (Agent: If yes, when and what did the pharmacy advise?)patient called right in for narcotic  Preferred Pharmacy (with phone number or street name):  [facility] [place], [place]. Phone:  [phone]  Fax:  [phone]     Has the patient been seen for an appointment in the last year OR does the patient have an upcoming appointment? yes  Agent: Please be advised that RX refills may take up to 3 business days. We ask that you follow-up with your pharmacy.

## 2021-12-13 NOTE — Telephone Encounter (Signed)
Duplicate request

## 2021-12-13 NOTE — Telephone Encounter (Signed)
Requested medication (s) are due for refill today: yes  Requested medication (s) are on the active medication list: yes  Last refill:  11/16/21 #180  Future visit scheduled: yes  Notes to clinic:  Please review for refill. Refill not delegated per protocol.  Pharmacy note Sig: TAKE 1 TABLET BY MOUTH EVERY 4 HOURS     important  Maximum MME cannot be calculated for this prescription. Enter discrete sig details to calculate maximum MME.        Requested Prescriptions  Pending Prescriptions Disp Refills   oxyCODONE (OXY IR/ROXICODONE) 5 MG immediate release tablet [Pharmacy Med Name: OXYCODONE HCL 5 MG TAB] 180 tablet     Sig: TAKE 1 TABLET BY MOUTH EVERY 4 HOURS     Not Delegated - Analgesics:  Opioid Agonists Failed - 12/13/2021 10:56 AM      Failed - This refill cannot be delegated      Failed - Urine Drug Screen completed in last 360 days      Failed - Valid encounter within last 3 months    Recent Outpatient Visits           1 month ago    [facility] Lucas Ramirez., MD   3 months ago Hypotension, unspecified hypotension type   [facility] Birdie Sons, MD   5 months ago Prostate cancer screening   [facility] Birdie Sons, MD   1 year ago Chronic right-sided low back pain with right-sided sciatica   [facility] Birdie Sons, MD   1 year ago Hyponatremia   [facility] Birdie Sons, MD       Future Appointments             In 1 week [place], Kirstie Peri, MD [facility] [place]

## 2021-12-14 ENCOUNTER — Other Ambulatory Visit: Payer: Self-pay | Admitting: Family Medicine

## 2021-12-14 DIAGNOSIS — G629 Polyneuropathy, unspecified: Secondary | ICD-10-CM

## 2021-12-16 ENCOUNTER — Institutional Professional Consult (permissible substitution): Payer: Medicare PPO | Admitting: Pulmonary Disease

## 2021-12-24 ENCOUNTER — Encounter: Payer: Self-pay | Admitting: Family Medicine

## 2021-12-24 ENCOUNTER — Ambulatory Visit: Payer: Medicare PPO | Admitting: Family Medicine

## 2021-12-24 VITALS — BP 93/45 | HR 112 | Temp 98.4°F | Resp 16 | Wt 112.7 lb

## 2021-12-24 DIAGNOSIS — E46 Unspecified protein-calorie malnutrition: Secondary | ICD-10-CM

## 2021-12-24 DIAGNOSIS — R634 Abnormal weight loss: Secondary | ICD-10-CM | POA: Diagnosis not present

## 2021-12-24 DIAGNOSIS — G4700 Insomnia, unspecified: Secondary | ICD-10-CM | POA: Diagnosis not present

## 2021-12-24 NOTE — Progress Notes (Signed)
I,Lucas Ramirez,acting as a scribe for Lucas Huh, MD.,have documented all relevant documentation on the behalf of Lucas Huh, MD,as directed by  Lucas Huh, MD while in the presence of Lucas Huh, MD.   Established patient visit   Patient: Lucas Ramirez   DOB: [birth year]   66 y.o. Male  MRN: [id number] Visit Date: 12/24/2021  Today's healthcare provider: Lelon Huh, MD   Chief Complaint  Patient presents with   Hypertension   Insomnia   Subjective    Hypertension, follow-up  BP Readings from Last 3 Encounters:  12/24/21 (!) 93/45  08/30/21 (!) 148/82  08/26/21 103/63   Wt Readings from Last 3 Encounters:  12/24/21 112 lb 11.2 oz (51.1 kg)  08/30/21 117 lb 9.6 oz (53.3 kg)  08/13/21 120 lb (54.4 kg)     He was last seen for hypertension 4 months ago.  BP at that visit was 148/82. Management since that visit includes:  Resume only lisinopril 20 MG tablet; but only takes 0.5 tablets (10 mg total) by mouth daily. Reports he has been taken a whole one.  He reports fair compliance with treatment. He is not having side effects.  He is following a Regular diet. He is not exercising. Some arm exercises with dumb bells.  He does smoke.  Use of agents associated with hypertension: none.   Outside blood pressures are:  Symptoms: No chest pain No chest pressure  No palpitations No syncope  No dyspnea No orthopnea  No paroxysmal nocturnal dyspnea No lower extremity edema   Pertinent labs Lab Results  Component Value Date   CHOL 105 08/20/2020   HDL 23 (L) 08/20/2020   LDLCALC 58 08/20/2020   TRIG 121 08/20/2020   CHOLHDL 4.6 08/20/2020   Lab Results  Component Value Date   NA 125 (L) 08/26/2021   K 4.0 08/26/2021   CREATININE 1.40 (H) 08/26/2021   GFRNONAA 56 (L) 08/26/2021   GLUCOSE 124 (H) 08/26/2021   TSH 0.580 07/02/2021     The ASCVD Risk score (Arnett DK, et al., 2019) failed to calculate for the following reasons:   The patient has a  prior MI or stroke diagnosis  ---------------------------------------------------------------------------------------------------  Follow up for insomnia   The patient was last seen for this 4 months ago. Changes made at last visit include .Start mirtazapine 15 mg.  He reports good compliance with treatment. He feels that condition is Improved. He is not having side effects.   ----------------------------------------------------------------------------------------- He also reports urgency of Bms the last couple of days, but no watery or loose stool. He did have colonoscopy by Dr. Vicente Males in 2021. He has since been referred back to GI for evaluation of unexplained weight loss and has appointment scheduled on 01/11/2022.    Medications: Outpatient Medications Prior to Visit  Medication Sig   acetaminophen (TYLENOL) 500 MG tablet Take 1 tablet (500 mg total) by mouth every 6 (six) hours as needed for mild pain.   albuterol (VENTOLIN HFA) 108 (90 Base) MCG/ACT inhaler Inhale 2 puffs into the lungs every 6 (six) hours as needed for wheezing or shortness of breath.   aspirin EC 81 MG EC tablet Take 1 tablet (81 mg total) by mouth daily. Swallow whole.   calcium carbonate (OSCAL) 1500 (600 Ca) MG TABS tablet Take 600 mg of elemental calcium by mouth 2 (two) times daily with a meal.   cholecalciferol (VITAMIN D) 1000 UNITS tablet Take 1,000 Units by mouth daily.   clopidogrel (PLAVIX)  75 MG tablet TAKE 1 TABLET BY MOUTH ONCE DAILY   denosumab (PROLIA) 60 MG/ML SOSY injection Inject 60 mg into the skin every 6 (six) months.   furosemide (LASIX) 20 MG tablet TAKE 1 TABLET BY MOUTH EVERY OTHER DAY AS NEEDED FOR EDEMA   gabapentin (NEURONTIN) 800 MG tablet TAKE 1 TABLET BY MOUTH 3 TIMES DAILY AS NEEDED AS DIRECTED   latanoprost (XALATAN) 0.005 % ophthalmic solution Place 1-2 drops into both eyes See admin instructions. Instill 1 drop into left eye at bedtime Instill 2 drops into right eye at bedtime    lisinopril (ZESTRIL) 20 MG tablet Take 0.5 tablets (10 mg total) by mouth daily.   mirtazapine (REMERON) 15 MG tablet TAKE 1/2-1 TABLET BY MOUTH AT BEDTIME   omeprazole (PRILOSEC) 20 MG capsule TAKE 1 CAPSULE BY MOUTH ONCE DAILY   oxyCODONE (OXY IR/ROXICODONE) 5 MG immediate release tablet TAKE 1 TABLET BY MOUTH EVERY 4 HOURS   rosuvastatin (CRESTOR) 20 MG tablet TAKE 1 TABLET BY MOUTH AT BEDTIME   SF 5000 PLUS 1.1 % CREA dental cream Place 1 application onto teeth in the morning and at bedtime.   DULoxetine (CYMBALTA) 20 MG capsule Take 20 mg by mouth 2 (two) times daily. Morning & afternoon (Patient not taking: Reported on 12/24/2021)   erythromycin ophthalmic ointment Place 1 application. into both eyes at bedtime. (Patient not taking: Reported on 12/24/2021)   No facility-administered medications prior to visit.    Review of Systems     Objective    BP (!) 93/45 (BP Location: Right Arm, Patient Position: Sitting, Cuff Size: Normal)   Pulse (!) 112   Temp 98.4 F (36.9 C) (Oral)   Resp 16   Wt 112 lb 11.2 oz (51.1 kg)   SpO2 98%   BMI 18.19 kg/m    Physical Exam    General: Appearance:    Frail male in no acute distress  Eyes:    PERRL, conjunctiva/corneas clear, EOM's intact       Lungs:     Clear to auscultation bilaterally, respirations unlabored  Heart:    Tachycardic. Normal rhythm. No murmurs, rubs, or gallops.    MS:   All extremities are intact.    Neurologic:   Awake, alert, oriented x 3. No apparent focal neurological defect.         Assessment & Plan     1. Abnormal weight loss  - CBC w/Diff/Platelet - Lipase - QuantiFERON-TB Gold Plus - TSH - T4, free  Has had some minor bowel changes the last few days. He has appointment scheduled with Dr. Vicente Males on 01-13-2022.  He was previously followed by Dr. 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 There was a 1.4 cm cystic lesion of the pancreatic head noted on that study.   2. Protein-calorie malnutrition, unspecified severity (HCC)  - CBC w/Diff/Platelet - Lipase - QuantiFERON-TB Gold Plus - TSH - T4, free  3. Insomnia, unspecified type Sleep improved with mirtazapine, although he continues to lose weight as above.       The entirety of the information documented in the History of Present Illness, Review of Systems and Physical Exam were personally obtained by me. Portions of this information were initially documented by the CMA and reviewed by me for thoroughness and accuracy.     Lucas Huh, MD  [place] [phone] (phone) [phone] (fax)  [place]

## 2022-01-05 ENCOUNTER — Telehealth: Payer: Self-pay | Admitting: Family Medicine

## 2022-01-05 NOTE — Telephone Encounter (Signed)
I called and spoke with patient. He says he plans to get labs done either Monday or Tuesday of next week.

## 2022-01-05 NOTE — Telephone Encounter (Signed)
Patient was seen 12-24-2021 and had labs ordered for evaluation of weight loss and low blood pressure, but have still not  gotten results. Please check with patient to see when he can get these done.

## 2022-01-07 DIAGNOSIS — E46 Unspecified protein-calorie malnutrition: Secondary | ICD-10-CM | POA: Diagnosis not present

## 2022-01-07 DIAGNOSIS — R634 Abnormal weight loss: Secondary | ICD-10-CM | POA: Diagnosis not present

## 2022-01-10 ENCOUNTER — Other Ambulatory Visit: Payer: Self-pay | Admitting: Family Medicine

## 2022-01-10 NOTE — Telephone Encounter (Signed)
Requested medication (s) are due for refill today - yes  Requested medication (s) are on the active medication list -yes  Future visit scheduled -yes  Last refill: 12/14/21 #180  Notes to clinic: non delegated Rx  Requested Prescriptions  Pending Prescriptions Disp Refills   oxyCODONE (OXY IR/ROXICODONE) 5 MG immediate release tablet [Pharmacy Med Name: OXYCODONE HCL 5 MG TAB] 180 tablet     Sig: TAKE 1 TABLET BY MOUTH EVERY 4 HOURS     Not Delegated - Analgesics:  Opioid Agonists Failed - 01/10/2022 10:58 AM      Failed - This refill cannot be delegated      Failed - Urine Drug Screen completed in last 360 days      Passed - Valid encounter within last 3 months    Recent Outpatient Visits           2 weeks ago Abnormal weight loss   [facility] Birdie Sons, MD   2 months ago    [facility] Rosanna Randy, Retia Passe., MD   4 months ago Hypotension, unspecified hypotension type   [facility] Birdie Sons, MD   6 months ago Prostate cancer screening   [facility] Birdie Sons, MD   1 year ago Chronic right-sided low back pain with right-sided sciatica   [facility] Birdie Sons, MD                 Requested Prescriptions  Pending Prescriptions Disp Refills   oxyCODONE (OXY IR/ROXICODONE) 5 MG immediate release tablet [Pharmacy Med Name: OXYCODONE HCL 5 MG TAB] 180 tablet     Sig: TAKE 1 TABLET BY MOUTH EVERY 4 HOURS     Not Delegated - Analgesics:  Opioid Agonists Failed - 01/10/2022 10:58 AM      Failed - This refill cannot be delegated      Failed - Urine Drug Screen completed in last 360 days      Passed - Valid encounter within last 3 months    Recent Outpatient Visits           2 weeks ago Abnormal weight loss   [facility] Birdie Sons, MD   2 months ago    [facility] Jerrol Banana., MD   4 months ago Hypotension,  unspecified hypotension type   [facility] Birdie Sons, MD   6 months ago Prostate cancer screening   [facility] Birdie Sons, MD   1 year ago Chronic right-sided low back pain with right-sided sciatica   [facility] Birdie Sons, MD

## 2022-01-11 ENCOUNTER — Ambulatory Visit: Payer: Medicare PPO | Admitting: Gastroenterology

## 2022-01-12 LAB — QUANTIFERON-TB GOLD PLUS
QuantiFERON Mitogen Value: 0.8 IU/mL
QuantiFERON Nil Value: 0.03 IU/mL
QuantiFERON TB1 Ag Value: 0.02 IU/mL
QuantiFERON TB2 Ag Value: 0.02 IU/mL
QuantiFERON-TB Gold Plus: NEGATIVE

## 2022-01-12 LAB — CBC WITH DIFFERENTIAL/PLATELET
Basophils Absolute: 0.1 10*3/uL (ref 0.0–0.2)
Basos: 0 %
EOS (ABSOLUTE): 0.1 10*3/uL (ref 0.0–0.4)
Eos: 1 %
Hematocrit: 41.9 % (ref 37.5–51.0)
Hemoglobin: 13.4 g/dL (ref 13.0–17.7)
Immature Grans (Abs): 0 10*3/uL (ref 0.0–0.1)
Immature Granulocytes: 0 %
Lymphocytes Absolute: 2.2 10*3/uL (ref 0.7–3.1)
Lymphs: 13 %
MCH: 29.1 pg (ref 26.6–33.0)
MCHC: 32 g/dL (ref 31.5–35.7)
MCV: 91 fL (ref 79–97)
Monocytes Absolute: 1.1 10*3/uL — ABNORMAL HIGH (ref 0.1–0.9)
Monocytes: 7 %
Neutrophils Absolute: 13.7 10*3/uL — ABNORMAL HIGH (ref 1.4–7.0)
Neutrophils: 79 %
Platelets: 505 10*3/uL — ABNORMAL HIGH (ref 150–450)
RBC: 4.61 x10E6/uL (ref 4.14–5.80)
RDW: 15.7 % — ABNORMAL HIGH (ref 11.6–15.4)
WBC: 17.3 10*3/uL — ABNORMAL HIGH (ref 3.4–10.8)

## 2022-01-12 LAB — T4, FREE: Free T4: 1.65 ng/dL (ref 0.82–1.77)

## 2022-01-12 LAB — TSH: TSH: 1.01 u[IU]/mL (ref 0.450–4.500)

## 2022-01-12 LAB — LIPASE: Lipase: 40 U/L (ref 13–78)

## 2022-01-13 ENCOUNTER — Telehealth: Payer: Self-pay

## 2022-01-13 DIAGNOSIS — R799 Abnormal finding of blood chemistry, unspecified: Secondary | ICD-10-CM

## 2022-01-13 NOTE — Telephone Encounter (Signed)
-----   Message from Birdie Sons, MD sent at 01/13/2022  4:31 PM EDT ----- Patient's blood cell count is rising. He needs to have blood drawn again and have blood peripheral smear ordered.

## 2022-01-13 NOTE — Telephone Encounter (Signed)
Dr. Caryn Section I am not able to find the order for blood peripheral smear. I have pended the orders. Please check.

## 2022-01-14 NOTE — Telephone Encounter (Signed)
That's the right one. Thanks!

## 2022-01-14 NOTE — Telephone Encounter (Signed)
"  Hematopathology consultation, peripheral smear" [facility] orderID= [id number]  Order is not in [place]. It will have to be put on paper order.

## 2022-01-14 NOTE — Telephone Encounter (Signed)
I put the order number  [id number] into Epic and found the test. The name in Epic is called "Pathologist smear review". I have it pending in this encounter. Is it OK to order from [place]?

## 2022-01-14 NOTE — Telephone Encounter (Signed)
Patient advised. Lab order printed. Patient plans to have blood work done next week.

## 2022-01-24 ENCOUNTER — Ambulatory Visit (INDEPENDENT_AMBULATORY_CARE_PROVIDER_SITE_OTHER): Payer: Medicare PPO

## 2022-01-24 VITALS — Wt 112.0 lb

## 2022-01-24 DIAGNOSIS — Z0000 Encounter for general adult medical examination without abnormal findings: Secondary | ICD-10-CM | POA: Diagnosis not present

## 2022-01-24 NOTE — Patient Instructions (Signed)
[name] , Thank you for taking time to come for your Medicare Wellness Visit. I appreciate your ongoing commitment to your health goals. Please review the following plan we discussed and let me know if I can assist you in the future.   Screening recommendations/referrals: Colonoscopy: 07/04/19 Recommended yearly ophthalmology/optometry visit for glaucoma screening and checkup Recommended yearly dental visit for hygiene and checkup  Vaccinations: Influenza vaccine: n/d Pneumococcal vaccine: 06/30/15 Tdap vaccine: 04/10/13 Shingles vaccine: n/d   Covid-19: 07/15/19, 08/05/19  Advanced directives: no  Conditions/risks identified: none  Next appointment: Follow up in one year for your annual wellness visit. 01/26/23 @ 1 pm by phone  Preventive Care 65 Years and Older, Male Preventive care refers to lifestyle choices and visits with your health care provider that can promote health and wellness. What does preventive care include? A yearly physical exam. This is also called an annual well check. Dental exams once or twice a year. Routine eye exams. Ask your health care provider how often you should have your eyes checked. Personal lifestyle choices, including: Daily care of your teeth and gums. Regular physical activity. Eating a healthy diet. Avoiding tobacco and drug use. Limiting alcohol use. Practicing safe sex. Taking low doses of aspirin every day. Taking vitamin and mineral supplements as recommended by your health care provider. What happens during an annual well check? The services and screenings done by your health care provider during your annual well check will depend on your age, overall health, lifestyle risk factors, and family history of disease. Counseling  Your health care provider may ask you questions about your: Alcohol use. Tobacco use. Drug use. Emotional well-being. Home and relationship well-being. Sexual activity. Eating habits. History of falls. Memory  and ability to understand (cognition). Work and work Statistician. Screening  You may have the following tests or measurements: Height, weight, and BMI. Blood pressure. Lipid and cholesterol levels. These may be checked every 5 years, or more frequently if you are over 9 years old. Skin check. Lung cancer screening. You may have this screening every year starting at age 52 if you have a 30-pack-year history of smoking and currently smoke or have quit within the past 15 years. Fecal occult blood test (FOBT) of the stool. You may have this test every year starting at age 12. Flexible sigmoidoscopy or colonoscopy. You may have a sigmoidoscopy every 5 years or a colonoscopy every 10 years starting at age 1. Prostate cancer screening. Recommendations will vary depending on your family history and other risks. Hepatitis C blood test. Hepatitis B blood test. Sexually transmitted disease (STD) testing. Diabetes screening. This is done by checking your blood sugar (glucose) after you have not eaten for a while (fasting). You may have this done every 1-3 years. Abdominal aortic aneurysm (AAA) screening. You may need this if you are a current or former smoker. Osteoporosis. You may be screened starting at age 55 if you are at high risk. Talk with your health care provider about your test results, treatment options, and if necessary, the need for more tests. Vaccines  Your health care provider may recommend certain vaccines, such as: Influenza vaccine. This is recommended every year. Tetanus, diphtheria, and acellular pertussis (Tdap, Td) vaccine. You may need a Td booster every 10 years. Zoster vaccine. You may need this after age 31. Pneumococcal 13-valent conjugate (PCV13) vaccine. One dose is recommended after age 26. Pneumococcal polysaccharide (PPSV23) vaccine. One dose is recommended after age 76. Talk to your health care provider  about which screenings and vaccines you need and how often you  need them. This information is not intended to replace advice given to you by your health care provider. Make sure you discuss any questions you have with your health care provider. Document Released: 05/08/2015 Document Revised: 12/30/2015 Document Reviewed: 02/10/2015 Elsevier Interactive Patient Education  2017 [employer] Prevention in the Home Falls can cause injuries. They can happen to people of all ages. There are many things you can do to make your home safe and to help prevent falls. What can I do on the outside of my home? Regularly fix the edges of walkways and driveways and fix any cracks. Remove anything that might make you trip as you walk through a door, such as a raised step or threshold. Trim any bushes or trees on the path to your home. Use bright outdoor lighting. Clear any walking paths of anything that might make someone trip, such as rocks or tools. Regularly check to see if handrails are loose or broken. Make sure that both sides of any steps have handrails. Any raised decks and porches should have guardrails on the edges. Have any leaves, snow, or ice cleared regularly. Use sand or salt on walking paths during winter. Clean up any spills in your garage right away. This includes oil or grease spills. What can I do in the bathroom? Use night lights. Install grab bars by the toilet and in the tub and shower. Do not use towel bars as grab bars. Use non-skid mats or decals in the tub or shower. If you need to sit down in the shower, use a plastic, non-slip stool. Keep the floor dry. Clean up any water that spills on the floor as soon as it happens. Remove soap buildup in the tub or shower regularly. Attach bath mats securely with double-sided non-slip rug tape. Do not have throw rugs and other things on the floor that can make you trip. What can I do in the bedroom? Use night lights. Make sure that you have a light by your bed that is easy to reach. Do not  use any sheets or blankets that are too big for your bed. They should not hang down onto the floor. Have a firm chair that has side arms. You can use this for support while you get dressed. Do not have throw rugs and other things on the floor that can make you trip. What can I do in the kitchen? Clean up any spills right away. Avoid walking on wet floors. Keep items that you use a lot in easy-to-reach places. If you need to reach something above you, use a strong step stool that has a grab bar. Keep electrical cords out of the way. Do not use floor polish or wax that makes floors slippery. If you must use wax, use non-skid floor wax. Do not have throw rugs and other things on the floor that can make you trip. What can I do with my stairs? Do not leave any items on the stairs. Make sure that there are handrails on both sides of the stairs and use them. Fix handrails that are broken or loose. Make sure that handrails are as long as the stairways. Check any carpeting to make sure that it is firmly attached to the stairs. Fix any carpet that is loose or worn. Avoid having throw rugs at the top or bottom of the stairs. If you do have throw rugs, attach them to the floor  with carpet tape. Make sure that you have a light switch at the top of the stairs and the bottom of the stairs. If you do not have them, ask someone to add them for you. What else can I do to help prevent falls? Wear shoes that: Do not have high heels. Have rubber bottoms. Are comfortable and fit you well. Are closed at the toe. Do not wear sandals. If you use a stepladder: Make sure that it is fully opened. Do not climb a closed stepladder. Make sure that both sides of the stepladder are locked into place. Ask someone to hold it for you, if possible. Clearly mark and make sure that you can see: Any grab bars or handrails. First and last steps. Where the edge of each step is. Use tools that help you move around (mobility  aids) if they are needed. These include: Canes. Walkers. Scooters. Crutches. Turn on the lights when you go into a dark area. Replace any light bulbs as soon as they burn out. Set up your furniture so you have a clear path. Avoid moving your furniture around. If any of your floors are uneven, fix them. If there are any pets around you, be aware of where they are. Review your medicines with your doctor. Some medicines can make you feel dizzy. This can increase your chance of falling. Ask your doctor what other things that you can do to help prevent falls. This information is not intended to replace advice given to you by your health care provider. Make sure you discuss any questions you have with your health care provider. Document Released: 02/05/2009 Document Revised: 09/17/2015 Document Reviewed: 05/16/2014 Elsevier Interactive Patient Education  2017 [employer].

## 2022-01-24 NOTE — Progress Notes (Signed)
Virtual Visit via Telephone Note  I connected with  [name] on 01/24/22 at  1:00 PM EDT by telephone and verified that I am speaking with the correct person using two identifiers.  Location: Patient: home Provider: [facility] Persons participating in the virtual visit: [place]   I discussed the limitations, risks, security and privacy concerns of performing an evaluation and management service by telephone and the availability of in person appointments. The patient expressed understanding and agreed to proceed.  Interactive audio and video telecommunications were attempted between this nurse and patient, however failed, due to patient having technical difficulties OR patient did not have access to video capability.  We continued and completed visit with audio only.  Some vital signs may be absent or patient reported.   Dionisio David, LPN  Subjective:   RUEBEN KASSIM is a 66 y.o. male who presents for Medicare Annual/Subsequent preventive examination.  Review of Systems     Cardiac Risk Factors include: advanced age (>61mn, >>33women);male gender;dyslipidemia     Objective:    There were no vitals filed for this visit. There is no height or weight on file to calculate BMI.     01/24/2022    1:15 PM 08/26/2021    4:41 PM 08/14/2021    2:00 AM 08/13/2021   10:49 PM 08/19/2020   11:15 AM 10/22/2019    8:29 AM 07/04/2019    7:59 AM  Advanced Directives  Does Patient Have a Medical Advance Directive? No Yes Yes Yes No Yes No  Type of Advance Directive  Living will  Living will  [place]   Does patient want to make changes to medical advance directive?   No - Patient declined   No - Patient declined   Copy of [place]?      No - copy requested   Would patient like information on creating a medical advance directive? No - Patient declined    No - Patient declined      Current Medications  (verified) Outpatient Encounter Medications as of 01/24/2022  Medication Sig   acetaminophen (TYLENOL) 500 MG tablet Take 1 tablet (500 mg total) by mouth every 6 (six) hours as needed for mild pain.   albuterol (VENTOLIN HFA) 108 (90 Base) MCG/ACT inhaler Inhale 2 puffs into the lungs every 6 (six) hours as needed for wheezing or shortness of breath.   calcium carbonate (OSCAL) 1500 (600 Ca) MG TABS tablet Take 600 mg of elemental calcium by mouth 2 (two) times daily with a meal.   cholecalciferol (VITAMIN D) 1000 UNITS tablet Take 1,000 Units by mouth daily.   clopidogrel (PLAVIX) 75 MG tablet TAKE 1 TABLET BY MOUTH ONCE DAILY   denosumab (PROLIA) 60 MG/ML SOSY injection Inject 60 mg into the skin every 6 (six) months.   furosemide (LASIX) 20 MG tablet TAKE 1 TABLET BY MOUTH EVERY OTHER DAY AS NEEDED FOR EDEMA   gabapentin (NEURONTIN) 800 MG tablet TAKE 1 TABLET BY MOUTH 3 TIMES DAILY AS NEEDED AS DIRECTED   latanoprost (XALATAN) 0.005 % ophthalmic solution Place 1-2 drops into both eyes See admin instructions. Instill 1 drop into left eye at bedtime Instill 2 drops into right eye at bedtime   lisinopril (ZESTRIL) 20 MG tablet Take 0.5 tablets (10 mg total) by mouth daily.   mirtazapine (REMERON) 15 MG tablet TAKE 1/2-1 TABLET BY MOUTH AT BEDTIME   omeprazole (PRILOSEC) 20 MG capsule TAKE  1 CAPSULE BY MOUTH ONCE DAILY   oxyCODONE (OXY IR/ROXICODONE) 5 MG immediate release tablet TAKE 1 TABLET BY MOUTH EVERY 4 HOURS   rosuvastatin (CRESTOR) 20 MG tablet TAKE 1 TABLET BY MOUTH AT BEDTIME   SF 5000 PLUS 1.1 % CREA dental cream Place 1 application onto teeth in the morning and at bedtime.   [DISCONTINUED] gabapentin (NEURONTIN) 800 MG tablet Take by mouth.   aspirin EC 81 MG EC tablet Take 1 tablet (81 mg total) by mouth daily. Swallow whole. (Patient not taking: Reported on 01/24/2022)   DULoxetine (CYMBALTA) 20 MG capsule Take 20 mg by mouth 2 (two) times daily. Morning & afternoon (Patient not  taking: Reported on 12/24/2021)   erythromycin ophthalmic ointment Place 1 application. into both eyes at bedtime. (Patient not taking: Reported on 12/24/2021)   No facility-administered encounter medications on file as of 01/24/2022.    Allergies (verified) Bupropion hcl   History: Past Medical History:  Diagnosis Date   Bell's palsy    CAP (community acquired pneumonia) 06/28/2015   COPD (chronic obstructive pulmonary disease) (HCC)    Depression    GERD (gastroesophageal reflux disease)    Glaucoma    Hyperlipidemia    Hypertension    Migraine    Scoliosis    Shingles 2019   dx by dermatologist by patient report   Past Surgical History:  Procedure Laterality Date   [employer]   correcting Scoliosils per pt   COLONOSCOPY WITH PROPOFOL N/A 07/04/2019   Procedure: COLONOSCOPY WITH PROPOFOL;  Surgeon: Lin Landsman, MD;  Location: [facility];  Service: Gastroenterology;  Laterality: N/A;  PRIORITY 4   LUMBAR LAMINECTOMY/ DECOMPRESSION WITH MET-RX Right 10/30/2019   Procedure: L4-5 DECOMPRESSION;  Surgeon: Meade Maw, MD;  Location: [facility];  Service: Neurosurgery;  Laterality: Right;   SKIN LESION EXCISION  06/06/2013   facial left cheek. Dr. Evorn Gong   UPPER GI ENDOSCOPY  07/11/2008   [place]. Dr. Donnella Sham. Impression. -LA Grade C reflux esophagitis. 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   Family History  Problem Relation Age of Onset   Emphysema Mother    Down syndrome Sister    CAD Neg Hx    Heart disease Neg Hx    Colon cancer Neg Hx    Prostate cancer Neg Hx    Social History   Socioeconomic History   Marital status: Married    Spouse name: Not on file   Number of children: Not on file   Years of education: Not on file   Highest education level: Not on file  Occupational History   Not on file  Tobacco Use   Smoking status: Every Day    Packs/day: 0.10    Years: 35.00    Total pack years: 3.50    Types:  Cigarettes   Smokeless tobacco: Never  Vaping Use   Vaping Use: Some days  Substance and Sexual Activity   Alcohol use: No   Drug use: No   Sexual activity: Yes  Other Topics Concern   Not on file  Social History Narrative   Lives at home with wife.   Social Determinants of Health   Financial Resource Strain: Low Risk  (01/24/2022)   Overall Financial Resource Strain (CARDIA)    Difficulty of Paying Living Expenses: Not hard at all  Food Insecurity: No Food Insecurity (01/24/2022)   Hunger Vital Sign    Worried About Running Out of Food in the Last Year: Never true  Ran Out of Food in the Last Year: Never true  Transportation Needs: No Transportation Needs (01/24/2022)   PRAPARE - Hydrologist (Medical): No    Lack of Transportation (Non-Medical): No  Physical Activity: Inactive (01/24/2022)   Exercise Vital Sign    Days of Exercise per Week: 0 days    Minutes of Exercise per Session: 0 min  Stress: No Stress Concern Present (01/24/2022)   [place]    Feeling of Stress : Not at all  Social Connections: Moderately Isolated (01/24/2022)   Social Connection and Isolation Panel [NHANES]    Frequency of Communication with Friends and Family: More than three times a week    Frequency of Social Gatherings with Friends and Family: Twice a week    Attends Religious Services: Never    Marine scientist or Organizations: No    Attends Music therapist: Never    Marital Status: Married    Tobacco Counseling Ready to quit: Not Answered Counseling given: Not Answered   Clinical Intake:  Pre-visit preparation completed: Yes        Nutritional Risks: None Diabetes: No  How often do you need to have someone help you when you read instructions, pamphlets, or other written materials from your doctor or pharmacy?: 1 - Never  Diabetic?no  Interpreter Needed?:  No  Information entered by :: Kirke Shaggy, LPN   Activities of Daily Living    01/24/2022    1:18 PM 12/24/2021   11:21 AM  In your present state of health, do you have any difficulty performing the following activities:  Hearing? 0 0  Vision? 1 1  Difficulty concentrating or making decisions? 0 0  Walking or climbing stairs? 1 1  Dressing or bathing? 1 1  Doing errands, shopping? 1 1  Preparing Food and eating ? N   Using the Toilet? N   In the past six months, have you accidently leaked urine? N   Do you have problems with loss of bowel control? N   Managing your Medications? N   Managing your Finances? N   Housekeeping or managing your Housekeeping? Y     Patient Care Team: Birdie Sons, MD as PCP - General (Family Medicine)  Indicate any recent Medical Services you may have received from other than [facility] providers in the past year (date may be approximate).     Assessment:   This is a routine wellness examination for Rowe.  Hearing/Vision screen Hearing Screening - Comments:: No aids Vision Screening - Comments:: Wears glasses- [facility] Dr.Rubinstein or [facility]  Dietary issues and exercise activities discussed: Current Exercise Habits: The patient does not participate in regular exercise at present   Goals Addressed             This Visit's Progress    DIET - EAT MORE FRUITS AND VEGETABLES         Depression Screen    01/24/2022    1:13 PM 12/24/2021   11:20 AM 08/30/2021    1:16 PM 08/31/2020    3:58 PM 02/22/2019    8:56 AM  PHQ 2/9 Scores  PHQ - 2 Score 0 0 0 0 0  PHQ- 9 Score 0 '5  1 2    ' Fall Risk    01/24/2022    1:17 PM 12/24/2021   11:20 AM 08/31/2020    3:58 PM 02/22/2019    8:54 AM  Fall Risk   Falls in the past year? '1 1 1 1  ' Number falls in past yr: 1 1 0 0  Injury with Fall? 0 0 0 1  Risk for fall due to : History of fall(s)     Follow up Falls prevention discussed;Falls evaluation completed Falls evaluation completed      FALL  RISK PREVENTION PERTAINING TO THE HOME:  Any stairs in or around the home? No  If so, are there any without handrails? No  Home free of loose throw rugs in walkways, pet beds, electrical cords, etc? Yes  Adequate lighting in your home to reduce risk of falls? Yes   ASSISTIVE DEVICES UTILIZED TO PREVENT FALLS:  Life alert? No  Use of a cane, walker or w/c? Yes  Grab bars in the bathroom? Yes  Shower chair or bench in shower? Yes  Elevated toilet seat or a handicapped toilet? Yes   Cognitive Function:        01/24/2022    1:20 PM  6CIT Screen  What Year? 0 points  What month? 0 points  What time? 0 points  Count back from 20 0 points  Months in reverse 0 points  Repeat phrase 2 points  Total Score 2 points    Immunizations Immunization History  Administered Date(s) Administered   Influenza,inj,Quad PF,6+ Mos 06/30/2015, 03/22/2017, 01/25/2019   PFIZER(Purple Top)SARS-COV-2 Vaccination 07/15/2019, 08/05/2019   Pneumococcal Polysaccharide-23 04/10/2013, 06/30/2015   Td 12/24/2003   Tdap 04/10/2013    TDAP status: Up to date  Flu Vaccine status: Due, Education has been provided regarding the importance of this vaccine. Advised may receive this vaccine at local pharmacy or Health Dept. Aware to provide a copy of the vaccination record if obtained from local pharmacy or Health Dept. Verbalized acceptance and understanding.  Pneumococcal vaccine status: Due, Education has been provided regarding the importance of this vaccine. Advised may receive this vaccine at local pharmacy or Health Dept. Aware to provide a copy of the vaccination record if obtained from local pharmacy or Health Dept. Verbalized acceptance and understanding.  Covid-19 vaccine status: Completed vaccines  Qualifies for Shingles Vaccine? Yes   Zostavax completed No   Shingrix Completed?: No.    Education has been provided regarding the importance of this vaccine. Patient has been advised to call insurance  company to determine out of pocket expense if they have not yet received this vaccine. Advised may also receive vaccine at local pharmacy or Health Dept. Verbalized acceptance and understanding.  Screening Tests Health Maintenance  Topic Date Due   Zoster Vaccines- Shingrix (1 of 2) Never done   Pneumonia Vaccine 58+ Years old (2 - PCV) 06/29/2016   COVID-19 Vaccine (3 - Pfizer series) 09/30/2019   INFLUENZA VACCINE  11/23/2021   COLONOSCOPY (Pts 45-96yr Insurance coverage will need to be confirmed)  07/04/2022   TETANUS/TDAP  04/11/2023   Hepatitis C Screening  Completed   HPV VACCINES  Aged Out    Health Maintenance  Health Maintenance Due  Topic Date Due   Zoster Vaccines- Shingrix (1 of 2) Never done   Pneumonia Vaccine 66 Years old (2 - PCV) 06/29/2016   COVID-19 Vaccine (3 - Pfizer series) 09/30/2019   INFLUENZA VACCINE  11/23/2021    Colorectal cancer screening: Type of screening: Colonoscopy. Completed 07/04/19. Repeat every 3 years  Lung Cancer Screening: (Low Dose CT Chest recommended if Age 66-80years, 30 pack-year currently smoking OR have quit w/in 15years.) does qualify.   Lung  Cancer Screening Referral: declined  Additional Screening:  Hepatitis C Screening: does qualify; Completed 06/18/19  Vision Screening: Recommended annual ophthalmology exams for early detection of glaucoma and other disorders of the eye. Is the patient up to date with their annual eye exam?  Yes  Who is the provider or what is the name of the office in which the patient attends annual eye exams? [facility] If pt is not established with a provider, would they like to be referred to a provider to establish care? No .   Dental Screening: Recommended annual dental exams for proper oral hygiene  Community Resource Referral / Chronic Care Management: CRR required this visit?  No   CCM required this visit?  No      Plan:     I have personally reviewed and noted the following in the  patient's chart:   Medical and social history Use of alcohol, tobacco or illicit drugs  Current medications and supplements including opioid prescriptions. Patient is currently taking opioid prescriptions. Information provided to patient regarding non-opioid alternatives. Patient advised to discuss non-opioid treatment plan with their provider. Functional ability and status Nutritional status Physical activity Advanced directives List of other physicians Hospitalizations, surgeries, and ER visits in previous 12 months Vitals Screenings to include cognitive, depression, and falls Referrals and appointments  In addition, I have reviewed and discussed with patient certain preventive protocols, quality metrics, and best practice recommendations. A written personalized care plan for preventive services as well as general preventive health recommendations were provided to patient.     Dionisio David, LPN   [id number]   Nurse Notes: none

## 2022-01-27 ENCOUNTER — Telehealth: Payer: Self-pay | Admitting: Family Medicine

## 2022-01-27 NOTE — Telephone Encounter (Signed)
-----   Message from Lucas David, LPN sent at 35/07/5623  1:28 PM EDT ----- Hi Dr. Caryn Section, I did his AWV today. He had a question about his feet swelling. He takes lasix every other day and thought maybe he needed to up his dose?? Thanks!

## 2022-01-27 NOTE — Telephone Encounter (Signed)
The swelling is probably from low protein and albumin levels in his bloodstream. We need the results of the peripheral smear that was ordered.... has he had his blood drawn for that yet?

## 2022-01-28 NOTE — Telephone Encounter (Signed)
Patient reports he will get lab done Monday.

## 2022-02-01 DIAGNOSIS — M810 Age-related osteoporosis without current pathological fracture: Secondary | ICD-10-CM | POA: Diagnosis not present

## 2022-02-01 DIAGNOSIS — R799 Abnormal finding of blood chemistry, unspecified: Secondary | ICD-10-CM | POA: Diagnosis not present

## 2022-02-03 LAB — PATHOLOGIST SMEAR REVIEW
Basophils Absolute: 0 10*3/uL (ref 0.0–0.2)
Basos: 0 %
EOS (ABSOLUTE): 0 10*3/uL (ref 0.0–0.4)
Eos: 0 %
Hematocrit: 36.9 % — ABNORMAL LOW (ref 37.5–51.0)
Hemoglobin: 12.7 g/dL — ABNORMAL LOW (ref 13.0–17.7)
Immature Grans (Abs): 0 10*3/uL (ref 0.0–0.1)
Immature Granulocytes: 0 %
Lymphocytes Absolute: 1.1 10*3/uL (ref 0.7–3.1)
Lymphs: 12 %
MCH: 30.5 pg (ref 26.6–33.0)
MCHC: 34.4 g/dL (ref 31.5–35.7)
MCV: 89 fL (ref 79–97)
Monocytes Absolute: 0.8 10*3/uL (ref 0.1–0.9)
Monocytes: 9 %
Neutrophils Absolute: 7.1 10*3/uL — ABNORMAL HIGH (ref 1.4–7.0)
Neutrophils: 79 %
Platelets: 392 10*3/uL (ref 150–450)
RBC: 4.16 x10E6/uL (ref 4.14–5.80)
RDW: 15.8 % — ABNORMAL HIGH (ref 11.6–15.4)
WBC: 9.1 10*3/uL (ref 3.4–10.8)

## 2022-02-07 ENCOUNTER — Other Ambulatory Visit: Payer: Self-pay | Admitting: Family Medicine

## 2022-02-08 NOTE — Progress Notes (Signed)
Encounter created in error

## 2022-02-10 ENCOUNTER — Ambulatory Visit: Payer: Medicare PPO | Admitting: Gastroenterology

## 2022-02-21 ENCOUNTER — Encounter (INDEPENDENT_AMBULATORY_CARE_PROVIDER_SITE_OTHER): Payer: Self-pay

## 2022-02-21 ENCOUNTER — Other Ambulatory Visit: Payer: Self-pay | Admitting: Family Medicine

## 2022-02-21 DIAGNOSIS — I10 Essential (primary) hypertension: Secondary | ICD-10-CM

## 2022-02-22 NOTE — Telephone Encounter (Signed)
Requested medication (s) are due for refill today: For review  Requested medication (s) are on the active medication list: yes    Last refill: 08/30/21  Amount not specified.  Future visit scheduled No  Notes to clinic:Please review, sent via Interface. Amount not specified. Thank you  Requested Prescriptions  Pending Prescriptions Disp Refills   lisinopril (ZESTRIL) 20 MG tablet [Pharmacy Med Name: LISINOPRIL 20 MG TAB] 90 tablet     Sig: TAKE 1 TABLET BY MOUTH ONCE DAILY     Cardiovascular:  ACE Inhibitors Failed - 02/21/2022  9:00 AM      Failed - Cr in normal range and within 180 days    Creatinine  Date Value Ref Range Status  02/01/2014 1.06 0.60 - 1.30 mg/dL Final   Creatinine, Ser  Date Value Ref Range Status  08/26/2021 1.40 (H) 0.61 - 1.24 mg/dL Final         Failed - Last BP in normal range    BP Readings from Last 1 Encounters:  12/24/21 (!) 93/45         Passed - K in normal range and within 180 days    Potassium  Date Value Ref Range Status  08/26/2021 4.0 3.5 - 5.1 mmol/L Final  02/01/2014 3.6 3.5 - 5.1 mmol/L Final         Passed - Patient is not pregnant      Passed - Valid encounter within last 6 months    Recent Outpatient Visits           2 months ago Abnormal weight loss   [place], [place]   5 months ago Hypotension, unspecified hypotension type   [facility] Birdie Sons, MD   7 months ago Prostate cancer screening   [facility] Birdie Sons, MD   1 year ago Chronic right-sided low back pain with right-sided sciatica   [facility] Birdie Sons, MD   1 year ago Hyponatremia   [facility] Birdie Sons, MD

## 2022-03-07 ENCOUNTER — Ambulatory Visit: Payer: Medicare PPO | Admitting: Gastroenterology

## 2022-03-07 ENCOUNTER — Other Ambulatory Visit: Payer: Self-pay | Admitting: Family Medicine

## 2022-03-08 ENCOUNTER — Ambulatory Visit (INDEPENDENT_AMBULATORY_CARE_PROVIDER_SITE_OTHER): Payer: Medicare PPO | Admitting: Family Medicine

## 2022-03-08 ENCOUNTER — Encounter: Payer: Self-pay | Admitting: Family Medicine

## 2022-03-08 VITALS — BP 113/69 | HR 91 | Temp 97.5°F | Resp 20 | Wt 104.0 lb

## 2022-03-08 DIAGNOSIS — R053 Chronic cough: Secondary | ICD-10-CM

## 2022-03-08 DIAGNOSIS — R64 Cachexia: Secondary | ICD-10-CM

## 2022-03-08 MED ORDER — MEGESTROL ACETATE 400 MG/10ML PO SUSP: 800.0000 mg | Freq: Every day | ORAL | 0 refills | Status: AC

## 2022-03-08 NOTE — Progress Notes (Signed)
I,Roshena L Chambers,acting as a scribe for Lelon Huh, MD.,have documented all relevant documentation on the behalf of Lelon Huh, MD,as directed by  Lelon Huh, MD while in the presence of Lelon Huh, MD.    Established patient visit   Patient: Lucas Ramirez   DOB: 02-03-1956   66 y.o. Male  MRN: [id number] Visit Date: 03/08/2022  Today's healthcare provider: Lelon Huh, MD   Chief Complaint  Patient presents with   Weight Loss   Subjective    HPI  Weight loss: Patient reports unexplained weight loss and thin skin.  Wt Readings from Last 10 Encounters:  03/08/22 104 lb (47.2 kg)  01/24/22 112 lb (50.8 kg)  12/24/21 112 lb 11.2 oz (51.1 kg)  08/30/21 117 lb 9.6 oz (53.3 kg)  08/13/21 120 lb (54.4 kg)  07/02/21 119 lb (54 kg)  06/29/21 119 lb (54 kg)  12/29/20 128 lb (58.1 kg)  10/13/20 138 lb (62.6 kg)  09/29/20 143 lb (64.9 kg)   He has had extensive evaluation over the last year including head to toe imaging. Was prescribed mirtazapine but continues to lose weight. Was scheduled to see Dr. Vicente Males yesterday but he did not show for appointment. Denies nausea, vomiting, abdominal pain, unusual headaches or other neurological symptoms. Has had some stool color changes, but no diarrhea. No blood in stool. He had a single tubular adenoma excised from ascending colon in 2021. His main complaint today is feeling weak.    Medications: Outpatient Medications Prior to Visit  Medication Sig   acetaminophen (TYLENOL) 500 MG tablet Take 1 tablet (500 mg total) by mouth every 6 (six) hours as needed for mild pain.   albuterol (VENTOLIN HFA) 108 (90 Base) MCG/ACT inhaler Inhale 2 puffs into the lungs every 6 (six) hours as needed for wheezing or shortness of breath.   calcium carbonate (OSCAL) 1500 (600 Ca) MG TABS tablet Take 600 mg of elemental calcium by mouth 2 (two) times daily with a meal.   cholecalciferol (VITAMIN D) 1000 UNITS tablet Take 1,000 Units by mouth  daily.   clopidogrel (PLAVIX) 75 MG tablet TAKE 1 TABLET BY MOUTH ONCE DAILY   denosumab (PROLIA) 60 MG/ML SOSY injection Inject 60 mg into the skin every 6 (six) months.   furosemide (LASIX) 20 MG tablet TAKE 1 TABLET BY MOUTH EVERY OTHER DAY AS NEEDED FOR EDEMA   gabapentin (NEURONTIN) 800 MG tablet TAKE 1 TABLET BY MOUTH 3 TIMES DAILY AS NEEDED AS DIRECTED   latanoprost (XALATAN) 0.005 % ophthalmic solution Place 1-2 drops into both eyes See admin instructions. Instill 1 drop into left eye at bedtime Instill 2 drops into right eye at bedtime   lisinopril (ZESTRIL) 20 MG tablet TAKE 1 TABLET BY MOUTH ONCE DAILY   mirtazapine (REMERON) 15 MG tablet TAKE 1/2-1 TABLET BY MOUTH AT BEDTIME   omeprazole (PRILOSEC) 20 MG capsule TAKE 1 CAPSULE BY MOUTH ONCE DAILY   oxyCODONE (OXY IR/ROXICODONE) 5 MG immediate release tablet TAKE 1 TABLET BY MOUTH EVERY 4 HOURS   rosuvastatin (CRESTOR) 20 MG tablet TAKE 1 TABLET BY MOUTH AT BEDTIME   DULoxetine (CYMBALTA) 20 MG capsule Take 20 mg by mouth 2 (two) times daily. Morning & afternoon (Patient not taking: Reported on 12/24/2021)   erythromycin ophthalmic ointment Place 1 application. into both eyes at bedtime. (Patient not taking: Reported on 12/24/2021)   SF 5000 PLUS 1.1 % CREA dental cream Place 1 application onto teeth in the morning and at  bedtime. (Patient not taking: Reported on 03/08/2022)   [DISCONTINUED] aspirin EC 81 MG EC tablet Take 1 tablet (81 mg total) by mouth daily. Swallow whole. (Patient not taking: Reported on 01/24/2022)   No facility-administered medications prior to visit.    Review of Systems  Constitutional:  Positive for appetite change and unexpected weight change. Negative for chills and fever.  Respiratory:  Negative for chest tightness, shortness of breath and wheezing.   Cardiovascular:  Positive for leg swelling. Negative for chest pain and palpitations.  Gastrointestinal:  Negative for abdominal pain, nausea and vomiting.   Skin:        Dry flaky skin  Neurological:  Positive for weakness.  Hematological:  Bruises/bleeds easily.    Last CBC Lab Results  Component Value Date   WBC 9.1 02/01/2022   HGB 12.7 (L) 02/01/2022   HCT 36.9 (L) 02/01/2022   MCV 89 02/01/2022   MCH 30.5 02/01/2022   RDW 15.8 (H) 02/01/2022   PLT 392 17/40/8144   Last metabolic panel Lab Results  Component Value Date   GLUCOSE 124 (H) 08/26/2021   NA 125 (L) 08/26/2021   K 4.0 08/26/2021   CL 89 (L) 08/26/2021   CO2 27 08/26/2021   BUN 33 (H) 08/26/2021   CREATININE 1.40 (H) 08/26/2021   GFRNONAA 56 (L) 08/26/2021   CALCIUM 7.2 (L) 08/26/2021   PHOS 2.5 (L) 10/13/2020   PROT 6.0 (L) 08/26/2021   ALBUMIN 3.0 (L) 08/26/2021   LABGLOB 2.0 07/02/2021   AGRATIO 2.0 07/02/2021   BILITOT 0.5 08/26/2021   ALKPHOS 64 08/26/2021   AST 20 08/26/2021   ALT 33 08/26/2021   ANIONGAP 9 08/26/2021   Last thyroid functions Lab Results  Component Value Date   TSH 1.010 01/07/2022   T4TOTAL 8.5 12/06/2018   Last vitamin B12 and Folate Lab Results  Component Value Date   VITAMINB12 372 11/14/2018       Objective    BP 113/69 (BP Location: Right Arm, Patient Position: Sitting, Cuff Size: Normal)   Pulse 91   Temp (!) 97.5 F (36.4 C) (Oral)   Resp 20   Wt 104 lb (47.2 kg)   SpO2 100% Comment: room air  BMI 16.79 kg/m    Physical Exam  General: Appearance:    Cachectic male in no acute distress. Sitting comfortable in transport chair.   Eyes:    PERRL, conjunctiva/corneas clear, EOM's intact       Abd:   Soft, non tender, non distended, no masses. Normoactive bowel sounds.   Lungs:     Clear to auscultation bilaterally, respirations unlabored  Lymph:   No cervical, axillary, or inguinal lymph nodes palpated.   Heart:    Normal heart rate. Normal rhythm. No murmurs, rubs, or gallops.    MS:   All extremities are intact.    Neurologic:   Awake, alert, oriented x 3. No apparent focal neurological defect.          Assessment & Plan     1. Cachexia [facility]) Extensive imaging has been non-diagnostic. No significant consistent lab abnormalities. He was referred to GI and has appointment scheduled yesterday but he did not show and doesn't have an explanation for why he didn't go.  - Comprehensive metabolic panel - CBC with Differential/Platelet - QuantiFERON-TB Gold Plus - Protein Electrophoresis, (serum)  - megestrol (MEGACE) 400 MG/10ML suspension; Take 20 mLs (800 mg total) by mouth daily.  Dispense: 600 mL; Refill: 0   2. Chronic  cough  - QuantiFERON-TB Gold Plus       The entirety of the information documented in the History of Present Illness, Review of Systems and Physical Exam were personally obtained by me. Portions of this information were initially documented by the CMA and reviewed by me for thoroughness and accuracy.     Lelon Huh, MD  [facility] [phone] (phone) [phone] (fax)  [place]

## 2022-03-08 NOTE — Telephone Encounter (Signed)
Requested medication (s) are due for refill today - yes  Requested medication (s) are on the active medication list -yes  Future visit scheduled -visit today  Last refill: 02/07/22 #180  Notes to clinic: non delegated Rx  Requested Prescriptions  Pending Prescriptions Disp Refills   oxyCODONE (OXY IR/ROXICODONE) 5 MG immediate release tablet [Pharmacy Med Name: OXYCODONE HCL 5 MG TAB] 180 tablet     Sig: TAKE 1 TABLET BY MOUTH EVERY 4 HOURS     Not Delegated - Analgesics:  Opioid Agonists Failed - 03/07/2022  4:05 PM      Failed - This refill cannot be delegated      Failed - Urine Drug Screen completed in last 360 days      Passed - Valid encounter within last 3 months    Recent Outpatient Visits           Today    [facility] Birdie Sons, MD   2 months ago Abnormal weight loss   [facility] Birdie Sons, MD   6 months ago Hypotension, unspecified hypotension type   [facility] Birdie Sons, MD   8 months ago Prostate cancer screening   [facility] Birdie Sons, MD   1 year ago Chronic right-sided low back pain with right-sided sciatica   [facility] Birdie Sons, MD                 Requested Prescriptions  Pending Prescriptions Disp Refills   oxyCODONE (OXY IR/ROXICODONE) 5 MG immediate release tablet [Pharmacy Med Name: OXYCODONE HCL 5 MG TAB] 180 tablet     Sig: TAKE 1 TABLET BY MOUTH EVERY 4 HOURS     Not Delegated - Analgesics:  Opioid Agonists Failed - 03/07/2022  4:05 PM      Failed - This refill cannot be delegated      Failed - Urine Drug Screen completed in last 360 days      Passed - Valid encounter within last 3 months    Recent Outpatient Visits           Today    [facility] Birdie Sons, MD   2 months ago Abnormal weight loss   [facility] Birdie Sons, MD   6 months ago Hypotension, unspecified  hypotension type   [facility] Birdie Sons, MD   8 months ago Prostate cancer screening   [facility] Birdie Sons, MD   1 year ago Chronic right-sided low back pain with right-sided sciatica   [facility] Birdie Sons, MD

## 2022-04-05 ENCOUNTER — Other Ambulatory Visit: Payer: Self-pay | Admitting: Family Medicine

## 2022-04-05 NOTE — Telephone Encounter (Signed)
Need labs from 03/08/22 completed before we can approve prescription refill.

## 2022-04-05 NOTE — Telephone Encounter (Signed)
Requested medication (s) are due for refill today - yes  Requested medication (s) are on the active medication list -yes  Future visit scheduled -no  Last refill: 03/09/22 #180  Notes to clinic: non delegated Rx  Requested Prescriptions  Pending Prescriptions Disp Refills   oxyCODONE (OXY IR/ROXICODONE) 5 MG immediate release tablet [Pharmacy Med Name: OXYCODONE HCL 5 MG TAB] 180 tablet     Sig: TAKE 1 TABLET BY MOUTH EVERY 4 HOURS     Not Delegated - Analgesics:  Opioid Agonists Failed - 04/05/2022  8:53 AM      Failed - This refill cannot be delegated      Failed - Urine Drug Screen completed in last 360 days      Passed - Valid encounter within last 3 months    Recent Outpatient Visits           4 weeks ago Cachexia [facility])   [facility] Birdie Sons, MD   3 months ago Abnormal weight loss   [facility] Birdie Sons, MD   7 months ago Hypotension, unspecified hypotension type   [facility] Birdie Sons, MD   9 months ago Prostate cancer screening   [facility] Birdie Sons, MD   1 year ago Chronic right-sided low back pain with right-sided sciatica   [facility] Birdie Sons, MD                 Requested Prescriptions  Pending Prescriptions Disp Refills   oxyCODONE (OXY IR/ROXICODONE) 5 MG immediate release tablet [Pharmacy Med Name: OXYCODONE HCL 5 MG TAB] 180 tablet     Sig: TAKE 1 TABLET BY MOUTH EVERY 4 HOURS     Not Delegated - Analgesics:  Opioid Agonists Failed - 04/05/2022  8:53 AM      Failed - This refill cannot be delegated      Failed - Urine Drug Screen completed in last 360 days      Passed - Valid encounter within last 3 months    Recent Outpatient Visits           4 weeks ago Cachexia [facility])   [facility] Birdie Sons, MD   3 months ago Abnormal weight loss   [facility] Birdie Sons, MD   7 months ago  Hypotension, unspecified hypotension type   [facility] Birdie Sons, MD   9 months ago Prostate cancer screening   [facility] Birdie Sons, MD   1 year ago Chronic right-sided low back pain with right-sided sciatica   [facility] Birdie Sons, MD

## 2022-04-05 NOTE — Telephone Encounter (Signed)
Patient was advised. Patient states he and his wife are having a hard time getting around. Patient stated he will try to get labs done tomorrow.

## 2022-04-06 DIAGNOSIS — R053 Chronic cough: Secondary | ICD-10-CM | POA: Diagnosis not present

## 2022-04-06 DIAGNOSIS — R64 Cachexia: Secondary | ICD-10-CM | POA: Diagnosis not present

## 2022-04-07 ENCOUNTER — Encounter: Payer: Self-pay | Admitting: *Deleted

## 2022-04-07 ENCOUNTER — Other Ambulatory Visit: Payer: Self-pay | Admitting: Family Medicine

## 2022-04-07 DIAGNOSIS — E875 Hyperkalemia: Secondary | ICD-10-CM

## 2022-04-07 DIAGNOSIS — D72829 Elevated white blood cell count, unspecified: Secondary | ICD-10-CM

## 2022-04-07 MED ORDER — SODIUM POLYSTYRENE SULFONATE PO POWD: ORAL | 0 refills | Status: DC

## 2022-04-09 ENCOUNTER — Emergency Department: Payer: Medicare PPO

## 2022-04-09 ENCOUNTER — Inpatient Hospital Stay
Admission: EM | Admit: 2022-04-09 | Discharge: 2022-04-21 | DRG: 064 | Disposition: A | Discharge status: Home Health | Payer: Medicare PPO | Attending: Internal Medicine | Admitting: Internal Medicine

## 2022-04-09 ENCOUNTER — Other Ambulatory Visit: Payer: Self-pay

## 2022-04-09 ENCOUNTER — Observation Stay: Payer: Medicare PPO

## 2022-04-09 DIAGNOSIS — J4489 Other specified chronic obstructive pulmonary disease: Secondary | ICD-10-CM | POA: Diagnosis present

## 2022-04-09 DIAGNOSIS — H547 Unspecified visual loss: Secondary | ICD-10-CM | POA: Diagnosis present

## 2022-04-09 DIAGNOSIS — Z681 Body mass index (BMI) 19 or less, adult: Secondary | ICD-10-CM | POA: Diagnosis not present

## 2022-04-09 DIAGNOSIS — I69398 Other sequelae of cerebral infarction: Secondary | ICD-10-CM

## 2022-04-09 DIAGNOSIS — K219 Gastro-esophageal reflux disease without esophagitis: Secondary | ICD-10-CM | POA: Diagnosis present

## 2022-04-09 DIAGNOSIS — L8990 Pressure ulcer of unspecified site, unspecified stage: Secondary | ICD-10-CM | POA: Insufficient documentation

## 2022-04-09 DIAGNOSIS — I5032 Chronic diastolic (congestive) heart failure: Secondary | ICD-10-CM | POA: Diagnosis present

## 2022-04-09 DIAGNOSIS — Z1152 Encounter for screening for COVID-19: Secondary | ICD-10-CM | POA: Diagnosis not present

## 2022-04-09 DIAGNOSIS — I63511 Cerebral infarction due to unspecified occlusion or stenosis of right middle cerebral artery: Principal | ICD-10-CM | POA: Diagnosis present

## 2022-04-09 DIAGNOSIS — Z7189 Other specified counseling: Secondary | ICD-10-CM | POA: Diagnosis not present

## 2022-04-09 DIAGNOSIS — E611 Iron deficiency: Secondary | ICD-10-CM | POA: Diagnosis present

## 2022-04-09 DIAGNOSIS — G9349 Other encephalopathy: Secondary | ICD-10-CM | POA: Diagnosis present

## 2022-04-09 DIAGNOSIS — I639 Cerebral infarction, unspecified: Secondary | ICD-10-CM | POA: Diagnosis not present

## 2022-04-09 DIAGNOSIS — Z515 Encounter for palliative care: Secondary | ICD-10-CM

## 2022-04-09 DIAGNOSIS — Z79890 Hormone replacement therapy (postmenopausal): Secondary | ICD-10-CM

## 2022-04-09 DIAGNOSIS — Z7983 Long term (current) use of bisphosphonates: Secondary | ICD-10-CM

## 2022-04-09 DIAGNOSIS — I6381 Other cerebral infarction due to occlusion or stenosis of small artery: Secondary | ICD-10-CM | POA: Diagnosis not present

## 2022-04-09 DIAGNOSIS — J849 Interstitial pulmonary disease, unspecified: Secondary | ICD-10-CM | POA: Diagnosis present

## 2022-04-09 DIAGNOSIS — I10 Essential (primary) hypertension: Secondary | ICD-10-CM | POA: Diagnosis present

## 2022-04-09 DIAGNOSIS — I672 Cerebral atherosclerosis: Secondary | ICD-10-CM | POA: Diagnosis not present

## 2022-04-09 DIAGNOSIS — R069 Unspecified abnormalities of breathing: Secondary | ICD-10-CM | POA: Diagnosis not present

## 2022-04-09 DIAGNOSIS — R062 Wheezing: Secondary | ICD-10-CM | POA: Diagnosis not present

## 2022-04-09 DIAGNOSIS — Y92009 Unspecified place in unspecified non-institutional (private) residence as the place of occurrence of the external cause: Secondary | ICD-10-CM

## 2022-04-09 DIAGNOSIS — I110 Hypertensive heart disease with heart failure: Secondary | ICD-10-CM | POA: Diagnosis present

## 2022-04-09 DIAGNOSIS — R64 Cachexia: Secondary | ICD-10-CM | POA: Diagnosis present

## 2022-04-09 DIAGNOSIS — Z7982 Long term (current) use of aspirin: Secondary | ICD-10-CM

## 2022-04-09 DIAGNOSIS — F17200 Nicotine dependence, unspecified, uncomplicated: Secondary | ICD-10-CM

## 2022-04-09 DIAGNOSIS — X088XXA Exposure to other specified smoke, fire and flames, initial encounter: Secondary | ICD-10-CM | POA: Diagnosis present

## 2022-04-09 DIAGNOSIS — Z79899 Other long term (current) drug therapy: Secondary | ICD-10-CM

## 2022-04-09 DIAGNOSIS — R4182 Altered mental status, unspecified: Principal | ICD-10-CM

## 2022-04-09 DIAGNOSIS — S60229A Contusion of unspecified hand, initial encounter: Secondary | ICD-10-CM | POA: Diagnosis present

## 2022-04-09 DIAGNOSIS — T24011A Burn of unspecified degree of right thigh, initial encounter: Secondary | ICD-10-CM | POA: Diagnosis present

## 2022-04-09 DIAGNOSIS — K869 Disease of pancreas, unspecified: Secondary | ICD-10-CM | POA: Diagnosis present

## 2022-04-09 DIAGNOSIS — G8929 Other chronic pain: Secondary | ICD-10-CM | POA: Diagnosis present

## 2022-04-09 DIAGNOSIS — Z825 Family history of asthma and other chronic lower respiratory diseases: Secondary | ICD-10-CM

## 2022-04-09 DIAGNOSIS — M5430 Sciatica, unspecified side: Secondary | ICD-10-CM | POA: Diagnosis present

## 2022-04-09 DIAGNOSIS — A0472 Enterocolitis due to Clostridium difficile, not specified as recurrent: Secondary | ICD-10-CM | POA: Diagnosis present

## 2022-04-09 DIAGNOSIS — Z23 Encounter for immunization: Secondary | ICD-10-CM

## 2022-04-09 DIAGNOSIS — Z9181 History of falling: Secondary | ICD-10-CM

## 2022-04-09 DIAGNOSIS — I679 Cerebrovascular disease, unspecified: Secondary | ICD-10-CM | POA: Diagnosis not present

## 2022-04-09 DIAGNOSIS — E785 Hyperlipidemia, unspecified: Secondary | ICD-10-CM | POA: Diagnosis present

## 2022-04-09 DIAGNOSIS — L988 Other specified disorders of the skin and subcutaneous tissue: Secondary | ICD-10-CM | POA: Diagnosis present

## 2022-04-09 DIAGNOSIS — G936 Cerebral edema: Secondary | ICD-10-CM | POA: Diagnosis not present

## 2022-04-09 DIAGNOSIS — E43 Unspecified severe protein-calorie malnutrition: Secondary | ICD-10-CM | POA: Diagnosis present

## 2022-04-09 DIAGNOSIS — R262 Difficulty in walking, not elsewhere classified: Secondary | ICD-10-CM | POA: Diagnosis present

## 2022-04-09 DIAGNOSIS — M419 Scoliosis, unspecified: Secondary | ICD-10-CM | POA: Diagnosis not present

## 2022-04-09 DIAGNOSIS — E860 Dehydration: Secondary | ICD-10-CM | POA: Diagnosis present

## 2022-04-09 DIAGNOSIS — Z7401 Bed confinement status: Secondary | ICD-10-CM

## 2022-04-09 DIAGNOSIS — I771 Stricture of artery: Secondary | ICD-10-CM | POA: Diagnosis not present

## 2022-04-09 DIAGNOSIS — I6521 Occlusion and stenosis of right carotid artery: Secondary | ICD-10-CM | POA: Diagnosis present

## 2022-04-09 DIAGNOSIS — R627 Adult failure to thrive: Secondary | ICD-10-CM | POA: Diagnosis present

## 2022-04-09 DIAGNOSIS — R5381 Other malaise: Secondary | ICD-10-CM | POA: Diagnosis present

## 2022-04-09 DIAGNOSIS — F17210 Nicotine dependence, cigarettes, uncomplicated: Secondary | ICD-10-CM | POA: Diagnosis present

## 2022-04-09 DIAGNOSIS — R0602 Shortness of breath: Secondary | ICD-10-CM | POA: Diagnosis present

## 2022-04-09 DIAGNOSIS — Z79891 Long term (current) use of opiate analgesic: Secondary | ICD-10-CM

## 2022-04-09 DIAGNOSIS — T24012A Burn of unspecified degree of left thigh, initial encounter: Secondary | ICD-10-CM | POA: Diagnosis present

## 2022-04-09 DIAGNOSIS — Z8279 Family history of other congenital malformations, deformations and chromosomal abnormalities: Secondary | ICD-10-CM

## 2022-04-09 DIAGNOSIS — Z66 Do not resuscitate: Secondary | ICD-10-CM | POA: Diagnosis not present

## 2022-04-09 DIAGNOSIS — Z8619 Personal history of other infectious and parasitic diseases: Secondary | ICD-10-CM

## 2022-04-09 DIAGNOSIS — L89152 Pressure ulcer of sacral region, stage 2: Secondary | ICD-10-CM | POA: Diagnosis present

## 2022-04-09 DIAGNOSIS — N281 Cyst of kidney, acquired: Secondary | ICD-10-CM | POA: Diagnosis present

## 2022-04-09 DIAGNOSIS — Z888 Allergy status to other drugs, medicaments and biological substances status: Secondary | ICD-10-CM

## 2022-04-09 DIAGNOSIS — Z8701 Personal history of pneumonia (recurrent): Secondary | ICD-10-CM

## 2022-04-09 DIAGNOSIS — I2699 Other pulmonary embolism without acute cor pulmonale: Secondary | ICD-10-CM | POA: Diagnosis not present

## 2022-04-09 DIAGNOSIS — R000 Tachycardia, unspecified: Secondary | ICD-10-CM | POA: Diagnosis not present

## 2022-04-09 DIAGNOSIS — M5418 Radiculopathy, sacral and sacrococcygeal region: Secondary | ICD-10-CM | POA: Diagnosis present

## 2022-04-09 DIAGNOSIS — R609 Edema, unspecified: Secondary | ICD-10-CM

## 2022-04-09 DIAGNOSIS — E559 Vitamin D deficiency, unspecified: Secondary | ICD-10-CM | POA: Diagnosis present

## 2022-04-09 DIAGNOSIS — Z7902 Long term (current) use of antithrombotics/antiplatelets: Secondary | ICD-10-CM

## 2022-04-09 DIAGNOSIS — R0689 Other abnormalities of breathing: Secondary | ICD-10-CM | POA: Diagnosis not present

## 2022-04-09 DIAGNOSIS — R9431 Abnormal electrocardiogram [ECG] [EKG]: Secondary | ICD-10-CM | POA: Diagnosis not present

## 2022-04-09 DIAGNOSIS — T300 Burn of unspecified body region, unspecified degree: Secondary | ICD-10-CM | POA: Diagnosis not present

## 2022-04-09 LAB — CBC WITH DIFFERENTIAL/PLATELET
Abs Immature Granulocytes: 0.04 10*3/uL (ref 0.00–0.07)
Basophils Absolute: 0 10*3/uL (ref 0.0–0.1)
Basophils Relative: 0 %
Eosinophils Absolute: 0 10*3/uL (ref 0.0–0.5)
Eosinophils Relative: 0 %
HCT: 37.9 % — ABNORMAL LOW (ref 39.0–52.0)
Hemoglobin: 12.6 g/dL — ABNORMAL LOW (ref 13.0–17.0)
Immature Granulocytes: 0 %
Lymphocytes Relative: 8 %
Lymphs Abs: 1 10*3/uL (ref 0.7–4.0)
MCH: 30.7 pg (ref 26.0–34.0)
MCHC: 33.2 g/dL (ref 30.0–36.0)
MCV: 92.2 fL (ref 80.0–100.0)
Monocytes Absolute: 0.5 10*3/uL (ref 0.1–1.0)
Monocytes Relative: 5 %
Neutro Abs: 10.4 10*3/uL — ABNORMAL HIGH (ref 1.7–7.7)
Neutrophils Relative %: 87 %
Platelets: 639 10*3/uL — ABNORMAL HIGH (ref 150–400)
RBC: 4.11 MIL/uL — ABNORMAL LOW (ref 4.22–5.81)
RDW: 15.7 % — ABNORMAL HIGH (ref 11.5–15.5)
WBC: 11.9 10*3/uL — ABNORMAL HIGH (ref 4.0–10.5)
nRBC: 0 % (ref 0.0–0.2)

## 2022-04-09 LAB — RESP PANEL BY RT-PCR (RSV, FLU A&B, COVID)  RVPGX2
Influenza A by PCR: NEGATIVE
Influenza B by PCR: NEGATIVE
Resp Syncytial Virus by PCR: NEGATIVE
SARS Coronavirus 2 by RT PCR: NEGATIVE

## 2022-04-09 LAB — BLOOD GAS, VENOUS
Acid-base deficit: 0.6 mmol/L (ref 0.0–2.0)
Bicarbonate: 24.9 mmol/L (ref 20.0–28.0)
O2 Saturation: 73.7 %
Patient temperature: 37
pCO2, Ven: 43 mmHg — ABNORMAL LOW (ref 44–60)
pH, Ven: 7.37 (ref 7.25–7.43)
pO2, Ven: 44 mmHg (ref 32–45)

## 2022-04-09 LAB — COMPREHENSIVE METABOLIC PANEL
ALT: 28 U/L (ref 0–44)
AST: 45 U/L — ABNORMAL HIGH (ref 15–41)
Albumin: 3 g/dL — ABNORMAL LOW (ref 3.5–5.0)
Alkaline Phosphatase: 77 U/L (ref 38–126)
Anion gap: 11 (ref 5–15)
BUN: 21 mg/dL (ref 8–23)
CO2: 22 mmol/L (ref 22–32)
Calcium: 7.7 mg/dL — ABNORMAL LOW (ref 8.9–10.3)
Chloride: 102 mmol/L (ref 98–111)
Creatinine, Ser: 0.81 mg/dL (ref 0.61–1.24)
GFR, Estimated: >60 mL/min (ref >60)
Glucose, Bld: 122 mg/dL — ABNORMAL HIGH (ref 70–99)
Potassium: 5 mmol/L (ref 3.5–5.1)
Sodium: 135 mmol/L (ref 135–145)
Total Bilirubin: 0.8 mg/dL (ref 0.3–1.2)
Total Protein: 6.3 g/dL — ABNORMAL LOW (ref 6.5–8.1)

## 2022-04-09 LAB — TSH: TSH: 0.882 u[IU]/mL (ref 0.350–4.500)

## 2022-04-09 LAB — URINALYSIS, ROUTINE W REFLEX MICROSCOPIC
Bacteria, UA: NONE SEEN
Bilirubin Urine: NEGATIVE
Glucose, UA: NEGATIVE mg/dL
Hgb urine dipstick: NEGATIVE
Ketones, ur: 20 mg/dL — AB
Leukocytes,Ua: NEGATIVE
Nitrite: NEGATIVE
Protein, ur: 30 mg/dL — AB
Specific Gravity, Urine: 1.034 — ABNORMAL HIGH (ref 1.005–1.030)
pH: 6 (ref 5.0–8.0)

## 2022-04-09 LAB — IRON AND TIBC
Iron: 27 ug/dL — ABNORMAL LOW (ref 45–182)
Saturation Ratios: 14 % — ABNORMAL LOW (ref 17.9–39.5)
TIBC: 190 ug/dL — ABNORMAL LOW (ref 250–450)
UIBC: 163 ug/dL

## 2022-04-09 LAB — FERRITIN: Ferritin: 113 ng/mL (ref 24–336)

## 2022-04-09 LAB — PROCALCITONIN: Procalcitonin: 49.03 ng/mL

## 2022-04-09 LAB — FOLATE: Folate: 20 ng/mL (ref >5.9)

## 2022-04-09 LAB — MAGNESIUM: Magnesium: 1.8 mg/dL (ref 1.7–2.4)

## 2022-04-09 LAB — PHOSPHORUS: Phosphorus: 2.9 mg/dL (ref 2.5–4.6)

## 2022-04-09 LAB — BRAIN NATRIURETIC PEPTIDE: B Natriuretic Peptide: 365.1 pg/mL — ABNORMAL HIGH (ref 0.0–100.0)

## 2022-04-09 LAB — LACTIC ACID, PLASMA
Lactic Acid, Venous: 1.6 mmol/L (ref 0.5–1.9)
Lactic Acid, Venous: 1 mmol/L (ref 0.5–1.9)

## 2022-04-09 LAB — AMMONIA: Ammonia: 13 umol/L (ref 9–35)

## 2022-04-09 MED ORDER — OXYCODONE HCL 5 MG PO TABS
5.0000 mg | ORAL_TABLET | ORAL | Status: DC | PRN
  Administered 2022-04-10 – 2022-04-21 (×32): 5 mg via ORAL
  Filled 2022-04-09 (×32): qty 1

## 2022-04-09 MED ORDER — IPRATROPIUM-ALBUTEROL 0.5-2.5 (3) MG/3ML IN SOLN
3.0000 mL | Freq: Once | RESPIRATORY_TRACT | Status: AC
  Administered 2022-04-09: 3 mL via RESPIRATORY_TRACT
  Filled 2022-04-09: qty 3

## 2022-04-09 MED ORDER — SODIUM CHLORIDE 0.9 % IV SOLN: INTRAVENOUS | Status: DC

## 2022-04-09 MED ORDER — ONDANSETRON HCL 4 MG/2ML IJ SOLN: 4.0000 mg | Freq: Four times a day (QID) | INTRAMUSCULAR | Status: DC | PRN

## 2022-04-09 MED ORDER — VANCOMYCIN HCL 1250 MG/250ML IV SOLN
1250.0000 mg | Freq: Once | INTRAVENOUS | Status: AC
  Administered 2022-04-09: 1250 mg via INTRAVENOUS
  Filled 2022-04-09: qty 250

## 2022-04-09 MED ORDER — SODIUM CHLORIDE 0.9 % IV SOLN
2.0000 g | Freq: Once | INTRAVENOUS | Status: AC
  Administered 2022-04-09: 2 g via INTRAVENOUS
  Filled 2022-04-09: qty 12.5

## 2022-04-09 MED ORDER — ENOXAPARIN SODIUM 40 MG/0.4ML IJ SOSY
40.0000 mg | PREFILLED_SYRINGE | INTRAMUSCULAR | Status: DC
  Administered 2022-04-10 – 2022-04-21 (×12): 40 mg via SUBCUTANEOUS
  Filled 2022-04-09 (×12): qty 0.4

## 2022-04-09 MED ORDER — PANTOPRAZOLE SODIUM 40 MG PO TBEC
40.0000 mg | DELAYED_RELEASE_TABLET | Freq: Every day | ORAL | Status: DC
  Administered 2022-04-10 – 2022-04-21 (×12): 40 mg via ORAL
  Filled 2022-04-09 (×12): qty 1

## 2022-04-09 MED ORDER — IOHEXOL 350 MG/ML SOLN
75.0000 mL | Freq: Once | INTRAVENOUS | Status: AC | PRN
  Administered 2022-04-09: 75 mL via INTRAVENOUS

## 2022-04-09 MED ORDER — LATANOPROST 0.005 % OP SOLN
1.0000 [drp] | Freq: Every day | OPHTHALMIC | Status: DC
  Administered 2022-04-09 – 2022-04-10 (×2): 2 [drp] via OPHTHALMIC
  Administered 2022-04-11: 1 [drp] via OPHTHALMIC
  Administered 2022-04-14 – 2022-04-16 (×3): 2 [drp] via OPHTHALMIC
  Administered 2022-04-17 – 2022-04-19 (×3): 1 [drp] via OPHTHALMIC
  Administered 2022-04-20: 2 [drp] via OPHTHALMIC
  Filled 2022-04-09 (×2): qty 2.5

## 2022-04-09 MED ORDER — CALCIUM CARBONATE 1250 (500 CA) MG PO TABS
500.0000 mg | ORAL_TABLET | Freq: Two times a day (BID) | ORAL | Status: DC
  Administered 2022-04-10 – 2022-04-21 (×23): 1250 mg via ORAL
  Filled 2022-04-09 (×25): qty 1

## 2022-04-09 MED ORDER — ONDANSETRON HCL 4 MG PO TABS: 4.0000 mg | ORAL_TABLET | Freq: Four times a day (QID) | ORAL | Status: DC | PRN

## 2022-04-09 MED ORDER — TETANUS-DIPHTH-ACELL PERTUSSIS 5-2.5-18.5 LF-MCG/0.5 IM SUSY
0.5000 mL | PREFILLED_SYRINGE | Freq: Once | INTRAMUSCULAR | Status: AC
  Administered 2022-04-09: 0.5 mL via INTRAMUSCULAR
  Filled 2022-04-09: qty 0.5

## 2022-04-09 MED ORDER — MIRTAZAPINE 15 MG PO TABS
7.5000 mg | ORAL_TABLET | Freq: Every day | ORAL | Status: DC
  Administered 2022-04-09 – 2022-04-20 (×12): 7.5 mg via ORAL
  Filled 2022-04-09 (×12): qty 1

## 2022-04-09 MED ORDER — ACETAMINOPHEN 325 MG PO TABS
650.0000 mg | ORAL_TABLET | Freq: Once | ORAL | Status: AC
  Administered 2022-04-09: 650 mg via ORAL
  Filled 2022-04-09: qty 2

## 2022-04-09 MED ORDER — LACTATED RINGERS IV BOLUS
500.0000 mL | Freq: Once | INTRAVENOUS | Status: AC
  Administered 2022-04-09: 500 mL via INTRAVENOUS

## 2022-04-09 MED ORDER — GABAPENTIN 400 MG PO CAPS
800.0000 mg | ORAL_CAPSULE | Freq: Three times a day (TID) | ORAL | Status: DC
  Administered 2022-04-09 – 2022-04-21 (×35): 800 mg via ORAL
  Filled 2022-04-09 (×38): qty 2

## 2022-04-09 MED ORDER — NICOTINE 21 MG/24HR TD PT24
21.0000 mg | MEDICATED_PATCH | Freq: Every day | TRANSDERMAL | Status: DC
  Administered 2022-04-10 – 2022-04-21 (×12): 21 mg via TRANSDERMAL
  Filled 2022-04-09 (×12): qty 1

## 2022-04-09 MED ORDER — ACETAMINOPHEN 650 MG RE SUPP: 650.0000 mg | Freq: Four times a day (QID) | RECTAL | Status: DC | PRN

## 2022-04-09 MED ORDER — VITAMIN D 25 MCG (1000 UNIT) PO TABS: 1000.0000 [IU] | ORAL_TABLET | Freq: Every day | ORAL | Status: DC

## 2022-04-09 MED ORDER — MEGESTROL ACETATE 400 MG/10ML PO SUSP
400.0000 mg | Freq: Two times a day (BID) | ORAL | Status: DC
  Administered 2022-04-09 – 2022-04-21 (×23): 400 mg via ORAL
  Filled 2022-04-09 (×24): qty 10

## 2022-04-09 MED ORDER — ALBUTEROL SULFATE (2.5 MG/3ML) 0.083% IN NEBU
2.5000 mg | INHALATION_SOLUTION | RESPIRATORY_TRACT | Status: DC | PRN
  Administered 2022-04-13: 2.5 mg via RESPIRATORY_TRACT
  Filled 2022-04-09: qty 3

## 2022-04-09 MED ORDER — ACETAMINOPHEN 325 MG PO TABS
650.0000 mg | ORAL_TABLET | Freq: Four times a day (QID) | ORAL | Status: DC | PRN
  Administered 2022-04-10 – 2022-04-20 (×4): 650 mg via ORAL
  Filled 2022-04-09 (×5): qty 2
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
## 2022-04-09 NOTE — H&P (Signed)
History and Physical    Lucas Ramirez QIH:[id number] DOB: Jun 01, 1955 DOA: 04/09/2022  PCP: Birdie Sons, MD  Patient coming from: Home  I have personally briefly reviewed patient's old medical records available.   Chief Complaint: Shortness of breath, losing weight, not able to get around.  HPI: Lucas Ramirez is a 66 y.o. male with medical history significant of COPD and chronic smoker, chronic back pain on oxycodone, Bell's palsy, hypertension, hyperlipidemia, GERD, poor health maintenance, cachexia of unknown cause and extensively investigated with his primary care physician's office, history of stroke currently on Plavix brought from home with confusion, lethargy, shortness of breath ongoing for more than a month and worse for the last few days.  Patient tells me he does have shortness of breath but that has been chronic.  Patient tells me that he cannot walk these days because of extreme weakness.  He continues to smoke laying in the bed, falls asleep with smoking and burns his skin.  Denies any alcohol or drug use.  His symptoms are gradually worsening over the last 2 years but recently has gotten very bad. Reviewed his outpatient records, he had been investigated and recently started on Megace and Remeron with no effects.  Appetite is poor and has really not eaten good portion of meal for almost a month. Anorexia, progressive debility, unable to move around, chronic swelling of both legs.  Ongoing back pain with sciatica.  ED Course: Blood pressure are fairly stable.  Tachycardic with heart rate 127 and sinus rhythm.  On room air.  Hematologically normal.  Electrolytes are normal.  Renal functions normal.  Albumin is 3.  Lactic acid is normal.  CT head with possible subacute right frontoparietal stroke present on similar MRI in the past.  CT angiogram of the chest negative for pulmonary embolism but shows peripheral honeycombing.  Afebrile.  With his tachycardia, patient was given 2 L of IV  fluids, given dose of vancomycin and cefepime after drawing blood cultures.  Urinalysis pending but looks clear on catheterization sample.  Review of Systems: all systems are reviewed and pertinent positive as per HPI otherwise rest are negative.    Past Medical History:  Diagnosis Date   Bell's palsy    CAP (community acquired pneumonia) 06/28/2015   COPD (chronic obstructive pulmonary disease) (HCC)    Depression    GERD (gastroesophageal reflux disease)    Glaucoma    Hyperlipidemia    Hypertension    Migraine    Scoliosis    Shingles 2019   dx by dermatologist by patient report    Past Surgical History:  Procedure Laterality Date   [place]   correcting Scoliosils per pt   COLONOSCOPY WITH PROPOFOL N/A 07/04/2019   Procedure: COLONOSCOPY WITH PROPOFOL;  Surgeon: Lin Landsman, MD;  Location: [facility];  Service: Gastroenterology;  Laterality: N/A;  PRIORITY 4   LUMBAR LAMINECTOMY/ DECOMPRESSION WITH MET-RX Right 10/30/2019   Procedure: L4-5 DECOMPRESSION;  Surgeon: Meade Maw, MD;  Location: [facility];  Service: Neurosurgery;  Laterality: Right;   SKIN LESION EXCISION  06/06/2013   facial left cheek. Dr. Evorn Gong   UPPER GI ENDOSCOPY  07/11/2008   [place]. Dr. Donnella Sham. Impression. -LA Grade C reflux esophagitis. 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    Social history   reports that he has been smoking cigarettes. He has a 70.00 pack-year smoking history. He has never used smokeless tobacco. He reports that he does not drink alcohol and does  not use drugs.  Allergies  Allergen Reactions   Bupropion Hcl Other (See Comments)    seizures    Family History  Problem Relation Age of Onset   Emphysema Mother    Down syndrome Sister    CAD Neg Hx    Heart disease Neg Hx    Colon cancer Neg Hx    Prostate cancer Neg Hx      Prior to Admission medications   Medication Sig Start Date End Date Taking? Authorizing Provider   sodium polystyrene (KAYEXALATE) powder 15 grams four times daily 04/07/22   Birdie Sons, MD  acetaminophen (TYLENOL) 500 MG tablet Take 1 tablet (500 mg total) by mouth every 6 (six) hours as needed for mild pain. 10/30/19   Lonell Face, NP  albuterol (VENTOLIN HFA) 108 (90 Base) MCG/ACT inhaler Inhale 2 puffs into the lungs every 6 (six) hours as needed for wheezing or shortness of breath. 08/17/21   Priscella Mann, Trula Slade, MD  calcium carbonate (OSCAL) 1500 (600 Ca) MG TABS tablet Take 600 mg of elemental calcium by mouth 2 (two) times daily with a meal.    [provider]  cholecalciferol (VITAMIN D) 1000 UNITS tablet Take 1,000 Units by mouth daily.    [provider]  clopidogrel (PLAVIX) 75 MG tablet TAKE 1 TABLET BY MOUTH ONCE DAILY 08/04/21   Kris Hartmann, NP  denosumab (PROLIA) 60 MG/ML SOSY injection Inject 60 mg into the skin every 6 (six) months.    [provider]  DULoxetine (CYMBALTA) 20 MG capsule Take 20 mg by mouth 2 (two) times daily. Morning & afternoon Patient not taking: Reported on 12/24/2021    Vladimir Crofts, MD  erythromycin ophthalmic ointment Place 1 application. into both eyes at bedtime. Patient not taking: Reported on 12/24/2021 08/04/21   [provider]  furosemide (LASIX) 20 MG tablet TAKE 1 TABLET BY MOUTH EVERY OTHER DAY AS NEEDED FOR EDEMA 10/19/21   Virginia Crews, MD  gabapentin (NEURONTIN) 800 MG tablet TAKE 1 TABLET BY MOUTH 3 TIMES DAILY AS NEEDED AS DIRECTED 12/14/21   Birdie Sons, MD  latanoprost (XALATAN) 0.005 % ophthalmic solution Place 1-2 drops into both eyes See admin instructions. Instill 1 drop into left eye at bedtime Instill 2 drops into right eye at bedtime    [provider]  lisinopril (ZESTRIL) 20 MG tablet TAKE 1 TABLET BY MOUTH ONCE DAILY 02/22/22   Birdie Sons, MD  megestrol (MEGACE) 400 MG/10ML suspension Take 20 mLs (800 mg total) by mouth daily. 03/08/22 04/07/22  Birdie Sons, MD  mirtazapine (REMERON) 15 MG tablet TAKE 1/2-1 TABLET BY MOUTH AT BEDTIME 10/19/21   Jerrol Banana., MD  omeprazole (PRILOSEC) 20 MG capsule TAKE 1 CAPSULE BY MOUTH ONCE DAILY 08/04/21   Birdie Sons, MD  oxyCODONE (OXY IR/ROXICODONE) 5 MG immediate release tablet Take 1 tablet (5 mg total) by mouth every 4 (four) hours as needed for severe pain. 04/07/22   Birdie Sons, MD  rosuvastatin (CRESTOR) 20 MG tablet TAKE 1 TABLET BY MOUTH AT BEDTIME 08/04/21   Birdie Sons, MD  SF 5000 PLUS 1.1 % CREA dental cream Place 1 application onto teeth in the morning and at bedtime. Patient not taking: Reported on 03/08/2022 09/24/19   [provider]    Physical Exam: Vitals:   04/09/22 1500 04/09/22 1530 04/09/22 1600 04/09/22 1738  BP: (!) 157/92 (!) 176/101 (!) 157/80   Pulse:  95 (!) 121 (!) 127   Resp: 20 (!) 21 (!) 21   Temp:    99.5 F (37.5 C)  TempSrc:    Rectal  SpO2: 96% 100% 97%   Weight:      Height:        Constitutional: NAD, cachectic.  Sick looking.  Frail debilitated and pale looking. Vitals:   04/09/22 1500 04/09/22 1530 04/09/22 1600 04/09/22 1738  BP: (!) 157/92 (!) 176/101 (!) 157/80   Pulse: 95 (!) 121 (!) 127   Resp: 20 (!) 21 (!) 21   Temp:    99.5 F (37.5 C)  TempSrc:    Rectal  SpO2: 96% 100% 97%   Weight:      Height:       Eyes: Right eye is surgically closed.  Left eye PERRL, lids and conjunctivae normal ENMT: Mucous membranes are dry.  Posterior pharynx clear of any exudate or lesions.Normal dentition.  Neck: normal, supple, no masses, no thyromegaly Respiratory: clear to auscultation bilaterally, no wheezing, basal crackles and conducted upper airway sounds. Normal respiratory effort. No accessory muscle use.  Cardiovascular: Regular rate and rhythm, no murmurs / rubs / gallops.  2+ pedal edema mostly on the dorsum of the foot.  2+ pedal pulses. No carotid bruits.  Tachycardic. Abdomen: no tenderness, no masses palpated.  No hepatosplenomegaly. Bowel sounds positive.  Musculoskeletal: no clubbing / cyanosis. No joint deformity upper and lower extremities. Good ROM, no contractures.  Flaccid muscle tone. Skin: Extensive new and healing cigarette burn marks.  Pictures in the chart. Neurologic: CN 2-12 grossly intact. Sensation intact, DTR normal. Strength globally weak.      Labs on Admission: I have personally reviewed following labs and imaging studies  CBC: Recent Labs  Lab 04/06/22 1324 04/09/22 1331  WBC 20.2* 11.9*  NEUTROABS 17.5* 10.4*  HGB 11.2* 12.6*  HCT 34.3* 37.9*  MCV 93 92.2  PLT 592* 195*   Basic Metabolic Panel: Recent Labs  Lab 04/06/22 1324 04/09/22 1331  NA 135 135  K 6.2* 5.0  CL 101 102  CO2 23 22  GLUCOSE 95 122*  BUN 29* 21  CREATININE 1.13 0.81  CALCIUM 7.6* 7.7*   GFR: Estimated Creatinine Clearance: 69 mL/min (by C-G formula based on SCr of 0.81 mg/dL). Liver Function Tests: Recent Labs  Lab 04/06/22 1324 04/09/22 1331  AST 21 45*  ALT 23 28  ALKPHOS 81 77  BILITOT 0.2 0.8  PROT 4.6* 6.3*  ALBUMIN 2.8* 3.0*   No results for input(s): "LIPASE", "AMYLASE" in the last 168 hours. Recent Labs  Lab 04/09/22 1528  AMMONIA 13   Coagulation Profile: No results for input(s): "INR", "PROTIME" in the last 168 hours. Cardiac Enzymes: No results for input(s): "CKTOTAL", "CKMB", "CKMBINDEX", "TROPONINI" in the last 168 hours. BNP (last 3 results) No results for input(s): "PROBNP" in the last 8760 hours. HbA1C: No results for input(s): "HGBA1C" in the last 72 hours. CBG: No results for input(s): "GLUCAP" in the last 168 hours. Lipid Profile: No results for input(s): "CHOL", "HDL", "LDLCALC", "TRIG", "CHOLHDL", "LDLDIRECT" in the last 72 hours. Thyroid Function Tests: No results for input(s): "TSH", "T4TOTAL", "FREET4", "T3FREE", "THYROIDAB" in the last 72 hours. Anemia Panel: No results for input(s): "VITAMINB12", "FOLATE", "FERRITIN", "TIBC", "IRON",  "RETICCTPCT" in the last 72 hours. Urine analysis:    Component Value Date/Time   COLORURINE YELLOW 10/24/2019 0913   APPEARANCEUR CLEAR 10/24/2019 0913   APPEARANCEUR Clear 02/01/2014 1824   LABSPEC 1.020 10/24/2019  0913   LABSPEC 1.006 02/01/2014 1824   PHURINE 5.5 10/24/2019 0913   GLUCOSEU NEGATIVE 10/24/2019 0913   GLUCOSEU Negative 02/01/2014 1824   HGBUR NEGATIVE 10/24/2019 0913   BILIRUBINUR NEGATIVE 10/24/2019 0913   BILIRUBINUR Negative 02/01/2014 [place] 10/24/2019 0913   PROTEINUR NEGATIVE 10/24/2019 0913   NITRITE NEGATIVE 10/24/2019 0913   LEUKOCYTESUR NEGATIVE 10/24/2019 0913   LEUKOCYTESUR Negative 02/01/2014 1824    Radiological Exams on Admission: CT Angio Chest PE W and/or Wo Contrast  Result Date: 04/09/2022 CLINICAL DATA:  Pulmonary embolism. Scratched at pulmonary embolism suspected, high probability. Shortness of breath since yesterday. Tachycardia. EXAM: CT ANGIOGRAPHY CHEST WITH CONTRAST TECHNIQUE: Multidetector CT imaging of the chest was performed using the standard protocol during bolus administration of intravenous contrast. Multiplanar CT image reconstructions and MIPs were obtained to evaluate the vascular anatomy. RADIATION DOSE REDUCTION: This exam was performed according to the departmental dose-optimization program which includes automated exposure control, adjustment of the mA and/or kV according to patient size and/or use of iterative reconstruction technique. CONTRAST:  33m OMNIPAQUE IOHEXOL 350 MG/ML SOLN COMPARISON:  Two-view chest x-ray 04/09/2022 CTA chest 08/15/2021 FINDINGS: Cardiovascular: Heart size is normal. Atherosclerotic calcifications are present at the aortic arch and great vessel origins without significant stenosis or aneurysm. Pulmonary artery opacification is excellent. No focal filling defects are present to suggest pulmonary emboli. Mediastinum/Nodes: No enlarged mediastinal, hilar, or axillary lymph nodes.  Thyroid gland, trachea, and esophagus demonstrate no significant findings. Lungs/Pleura: Chronic peripheral honeycombing is present anteriorly in the left upper lobe and in the posterior left lower lobe. Scarring is present the right lung base. Chronic right pleural thickening and calcifications are present. Chronic scarring is present. No superimposed acute disease is present. Airways are patent. Upper Abdomen: Chronic pancreatic calcifications are compatible with remote pancreatitis. Right renal atrophy again noted. No acute abnormalities are present. Musculoskeletal: Exaggerated thoracic kyphosis with marked rightward curvature again noted. Diffuse right lateral segments present in the lower thoracic and lumbar spine, likely congenital. Review of the MIP images confirms the above findings. IMPRESSION: 1. No pulmonary embolus. 2. Chronic peripheral honeycombing anteriorly in the left upper lobe and in the posterior left lower lobe compatible with chronic interstitial lung disease. 3. Chronic right pleural thickening and calcifications. 4. No acute superimposed pulmonary disease. 5. Chronic pancreatic calcifications compatible with remote pancreatitis. 6.  Aortic Atherosclerosis (ICD10-I70.0). Electronically Signed   By: CSan MorelleM.D.   On: 04/09/2022 16:55   CT HEAD WO CONTRAST (5MM)  Result Date: 04/09/2022 CLINICAL DATA:  Mental status change, unknown cause EXAM: CT HEAD WITHOUT CONTRAST TECHNIQUE: Contiguous axial images were obtained from the base of the skull through the vertex without intravenous contrast. RADIATION DOSE REDUCTION: This exam was performed according to the departmental dose-optimization program which includes automated exposure control, adjustment of the mA and/or kV according to patient size and/or use of iterative reconstruction technique. COMPARISON:  08/26/2021 FINDINGS: Brain: New low-density changes within the high right frontoparietal region with loss of gray-white  differentiation. No evidence hemorrhage, hydrocephalus, extra-axial collection, or mass. Vascular: Atherosclerotic calcifications involving the large vessels of the skull base. No unexpected hyperdense vessel. Skull: Normal. Negative for fracture or focal lesion. Sinuses/Orbits: No acute finding. Other: None. IMPRESSION: New low-density changes within the high right frontoparietal region with loss of gray-white differentiation, suggestive of a acute or subacute infarct. Further evaluation with MRI is recommended. Electronically Signed   By: NDavina PokeD.O.   On: 04/09/2022 16:50  DG Chest 2 View  Result Date: 04/09/2022 CLINICAL DATA:  Shortness of breath EXAM: CHEST - 2 VIEW COMPARISON:  Chest x-ray Aug 26, 2021 FINDINGS: The cardiomediastinal silhouette is unchanged in contour. Stable right lung base and pleural opacity, likely scarring/pleural thickening. Stable left basilar bandlike opacities, likely scarring. No acute pulmonary opacity. No large pneumothorax. The visualized upper abdomen is unremarkable. Severe thoracolumbar scoliosis. No acute osseous abnormality. IMPRESSION: No acute cardiopulmonary abnormality. Stable chronic changes in bibasilar lungs. Electronically Signed   By: Beryle Flock M.D.   On: 04/09/2022 13:59    EKG: Independently reviewed.  Sinus tachycardia.  No acute findings.  Assessment/Plan Principal Problem:   Shortness of breath Active Problems:   HLD (hyperlipidemia)   HTN (hypertension)   Cerebrovascular disease   Tobacco dependence   Cachexia ([place])     1.  Shortness of breath: Multifactorial.  Likely underlying chronic bronchitis and fibrotic changes.  No evidence of pneumonia.  No evidence of pulmonary embolism.  Currently on room air. Bronchodilator therapy, chest physiotherapy, mobility PT OT. Will check echocardiogram to rule out cardiac cause of shortness of breath as well as cardiac cause of cachexia. Blood cultures were drawn.  Urine cultures  were drawn.  Will hold off on further antibiotics with no clear evidence of infection.  2.  Unexplained weight loss, severe protein calorie malnutrition and cachexia.  Anasarca. Recently investigations underway as outpatient. MRI of the abdomen pelvis 6/23, no intra-abdominal mass.  1.4 cm cystic mass in the pancreatic head.  Renal cysts. Check for nutritional deficiencies including Vitamin A, B1, B6, C37, folic acid, vitamin C, D, E, K. Check TSH, iron panel. Continue Megace 400 mg twice daily. Continue Remeron 7.5 mg at night. Nutrition consult, augment nutrition. Palliative care consultation for goals of care and symptom relief. PT OT consultation.  May need placement or skilled rehab.  3.  Confusion/encephalopathy: Probably chronic.  Rule out nutrient deficiency as above.  CT head is abnormal, however patient had stroke and previous MRI of the brain showing right frontoparietal stroke.  Will rule out acute stroke.  MRI of the brain.  If MRI brain positive for acute stroke, will do further investigations.  4.  Essential hypertension: At risk of hypotension.  Hold all antihypertensives.  5.  Hyperlipidemia: Statin is associated with depressed appetite.  Hold statin now.  6.  Smoker: Counseled to quit.  Nicotine patch.  DVT prophylaxis: Lovenox subcu Code Status: Full code Family Communication: None.  Wife left to go home. Disposition Plan: Unknown.  Probably SNF. Consults called: None Admission status: Telemetry.  Requirement for hospitalization and inpatient treatment will be evaluated every day.   Barb Merino MD [facility] Pager [phone]

## 2022-04-09 NOTE — Progress Notes (Signed)
   04/09/22 2023  Assess: MEWS Score  Temp 98.5 F (36.9 C)  BP (!) 161/79  MAP (mmHg) 99  Pulse Rate (!) 125  Resp (!) 21  SpO2 98 %  O2 Device Room Air  Assess: MEWS Score  MEWS Temp 0  MEWS Systolic 0  MEWS Pulse 2  MEWS RR 1  MEWS LOC 0  MEWS Score 3  MEWS Score Color Yellow  Assess: if the MEWS score is Yellow or Red  Were vital signs taken at a resting state? Yes  Focused Assessment No change from prior assessment  Does the patient meet 2 or more of the SIRS criteria? Yes  Does the patient have a confirmed or suspected source of infection? Yes  Provider and Rapid Response Notified? No  MEWS guidelines implemented *See Row Information* Yes  Treat  MEWS Interventions Escalated (See documentation below)  Pain Scale 0-10  Pain Score 0  Take Vital Signs  Increase Vital Sign Frequency  Yellow: Q 2hr X 2 then Q 4hr X 2, if remains yellow, continue Q 4hrs  Escalate  MEWS: Escalate Yellow: discuss with charge nurse/RN and consider discussing with provider and RRT  Notify: Charge Nurse/RN  Name of Charge Nurse/RN Notified [place], RN  Date Charge Nurse/RN Notified 04/09/22  Time Charge Nurse/RN Notified 2023  Provider Notification  Provider Name/Title Sharion Settler, NP  Date Provider Notified 04/09/22  Time Provider Notified 2200  Method of Notification Page  Notification Reason Other (Comment)  Provider response No new orders  Document  Patient Outcome Stabilized after interventions  Progress note created (see row info) Yes  Assess: SIRS CRITERIA  SIRS Temperature  0  SIRS Pulse 1  SIRS Respirations  1  SIRS WBC 0  SIRS Score Sum  2   Pt stable. Interventions initiated.

## 2022-04-09 NOTE — ED Provider Triage Note (Signed)
Emergency Medicine Provider Triage Evaluation Note  Lucas Ramirez , a 66 y.o. male  was evaluated in triage.  Pt complains of shortness of breath that started yesterday.   Review of Systems  Positive: Fever, chills, cough Negative:   Physical Exam  BP (!) 151/89   Pulse (!) 120   Temp 99.1 F (37.3 C) (Oral)   Resp 20   Ht '5\' 7"'$  (1.702 m)   Wt 54.4 kg   SpO2 98%   BMI 18.79 kg/m  Gen:   Awake, no distress  speaking in 2-3 words sentences.  Appears acutely ill.  Resp:  Normal effort.  Bilateral expiratory wheeze and diminished breath sounds.  Mild retractions. MSK:   Pitting edema lower extremities.  Patient states chronic Other:    Medical Decision Making  Medically screening exam initiated at 1:33 PM.  Appropriate orders placed.  Lucas Ramirez was informed that the remainder of the evaluation will be completed by another provider, this initial triage assessment does not replace that evaluation, and the importance of remaining in the ED until their evaluation is complete.     Lucas Hai, PA-C 04/09/22 1339

## 2022-04-09 NOTE — ED Triage Notes (Signed)
Pt to ED from home, [facility], for SOB since yesterday. Pt does appear SOB, speaking in 3-4 word sentences, raising shoulders with inspiration, and suprasternal retractions noted. 98% on RA. Pulse 116, temp 99.1. Weak, congested cough noted.   Pt complains of chronic 8/10 pain to R foot.  Answers orientation questions appropriately but slowly. Leaning over in wheelchair because feels weak. Pt appears pale.  EMS 18g IV to both R forearm and R AC  1 set cultures sent. PA saw pt in triage.

## 2022-04-09 NOTE — Consult Note (Signed)
PHARMACY -  BRIEF ANTIBIOTIC NOTE   Pharmacy has received consult(s) for Vancomycin and cefeoime from an ED provider.  The patient's profile has been reviewed for ht/wt/allergies/indication/available labs.    One time order(s) placed for : Cefepime 2gm x 1 dose Vancomycin '1250mg'$  IVPB x 1  Further antibiotics/pharmacy consults should be ordered by admitting physician if indicated.                       Thank you, Dorene Bruni Rodriguez-Guzman PharmD, BCPS 04/09/2022 5:16 PM

## 2022-04-10 ENCOUNTER — Observation Stay: Payer: Medicare PPO

## 2022-04-10 ENCOUNTER — Observation Stay (HOSPITAL_COMMUNITY)
Admit: 2022-04-10 | Discharge: 2022-04-10 | Disposition: A | Discharge status: Home / Self Care | Payer: Medicare PPO | Attending: Internal Medicine | Admitting: Internal Medicine

## 2022-04-10 DIAGNOSIS — G9349 Other encephalopathy: Secondary | ICD-10-CM | POA: Diagnosis present

## 2022-04-10 DIAGNOSIS — X088XXA Exposure to other specified smoke, fire and flames, initial encounter: Secondary | ICD-10-CM | POA: Diagnosis present

## 2022-04-10 DIAGNOSIS — J849 Interstitial pulmonary disease, unspecified: Secondary | ICD-10-CM | POA: Diagnosis present

## 2022-04-10 DIAGNOSIS — L8990 Pressure ulcer of unspecified site, unspecified stage: Secondary | ICD-10-CM | POA: Insufficient documentation

## 2022-04-10 DIAGNOSIS — Z515 Encounter for palliative care: Secondary | ICD-10-CM | POA: Diagnosis not present

## 2022-04-10 DIAGNOSIS — I6521 Occlusion and stenosis of right carotid artery: Secondary | ICD-10-CM | POA: Diagnosis present

## 2022-04-10 DIAGNOSIS — I110 Hypertensive heart disease with heart failure: Secondary | ICD-10-CM | POA: Diagnosis present

## 2022-04-10 DIAGNOSIS — M419 Scoliosis, unspecified: Secondary | ICD-10-CM | POA: Diagnosis not present

## 2022-04-10 DIAGNOSIS — I69398 Other sequelae of cerebral infarction: Secondary | ICD-10-CM | POA: Diagnosis not present

## 2022-04-10 DIAGNOSIS — A0472 Enterocolitis due to Clostridium difficile, not specified as recurrent: Secondary | ICD-10-CM | POA: Diagnosis present

## 2022-04-10 DIAGNOSIS — R9431 Abnormal electrocardiogram [ECG] [EKG]: Secondary | ICD-10-CM

## 2022-04-10 DIAGNOSIS — I679 Cerebrovascular disease, unspecified: Secondary | ICD-10-CM | POA: Diagnosis not present

## 2022-04-10 DIAGNOSIS — R64 Cachexia: Secondary | ICD-10-CM | POA: Diagnosis present

## 2022-04-10 DIAGNOSIS — Z7189 Other specified counseling: Secondary | ICD-10-CM | POA: Diagnosis not present

## 2022-04-10 DIAGNOSIS — E43 Unspecified severe protein-calorie malnutrition: Secondary | ICD-10-CM | POA: Diagnosis present

## 2022-04-10 DIAGNOSIS — R627 Adult failure to thrive: Secondary | ICD-10-CM | POA: Diagnosis present

## 2022-04-10 DIAGNOSIS — Z681 Body mass index (BMI) 19 or less, adult: Secondary | ICD-10-CM | POA: Diagnosis not present

## 2022-04-10 DIAGNOSIS — J4489 Other specified chronic obstructive pulmonary disease: Secondary | ICD-10-CM | POA: Diagnosis present

## 2022-04-10 DIAGNOSIS — K219 Gastro-esophageal reflux disease without esophagitis: Secondary | ICD-10-CM | POA: Diagnosis present

## 2022-04-10 DIAGNOSIS — I6381 Other cerebral infarction due to occlusion or stenosis of small artery: Secondary | ICD-10-CM | POA: Diagnosis not present

## 2022-04-10 DIAGNOSIS — E860 Dehydration: Secondary | ICD-10-CM | POA: Diagnosis present

## 2022-04-10 DIAGNOSIS — R0602 Shortness of breath: Secondary | ICD-10-CM | POA: Diagnosis present

## 2022-04-10 DIAGNOSIS — T24011A Burn of unspecified degree of right thigh, initial encounter: Secondary | ICD-10-CM | POA: Diagnosis present

## 2022-04-10 DIAGNOSIS — G936 Cerebral edema: Secondary | ICD-10-CM | POA: Diagnosis not present

## 2022-04-10 DIAGNOSIS — Z23 Encounter for immunization: Secondary | ICD-10-CM | POA: Diagnosis present

## 2022-04-10 DIAGNOSIS — K869 Disease of pancreas, unspecified: Secondary | ICD-10-CM | POA: Diagnosis present

## 2022-04-10 DIAGNOSIS — Z1152 Encounter for screening for COVID-19: Secondary | ICD-10-CM | POA: Diagnosis not present

## 2022-04-10 DIAGNOSIS — I672 Cerebral atherosclerosis: Secondary | ICD-10-CM | POA: Diagnosis not present

## 2022-04-10 DIAGNOSIS — Y92009 Unspecified place in unspecified non-institutional (private) residence as the place of occurrence of the external cause: Secondary | ICD-10-CM | POA: Diagnosis not present

## 2022-04-10 DIAGNOSIS — H547 Unspecified visual loss: Secondary | ICD-10-CM | POA: Diagnosis present

## 2022-04-10 DIAGNOSIS — I63511 Cerebral infarction due to unspecified occlusion or stenosis of right middle cerebral artery: Secondary | ICD-10-CM | POA: Diagnosis present

## 2022-04-10 DIAGNOSIS — Z66 Do not resuscitate: Secondary | ICD-10-CM | POA: Diagnosis not present

## 2022-04-10 DIAGNOSIS — E785 Hyperlipidemia, unspecified: Secondary | ICD-10-CM | POA: Diagnosis present

## 2022-04-10 DIAGNOSIS — L89152 Pressure ulcer of sacral region, stage 2: Secondary | ICD-10-CM | POA: Diagnosis present

## 2022-04-10 DIAGNOSIS — I771 Stricture of artery: Secondary | ICD-10-CM | POA: Diagnosis not present

## 2022-04-10 DIAGNOSIS — I5032 Chronic diastolic (congestive) heart failure: Secondary | ICD-10-CM | POA: Diagnosis present

## 2022-04-10 LAB — LIPID PANEL
Cholesterol: 91 mg/dL (ref 0–200)
HDL: 23 mg/dL — ABNORMAL LOW (ref >40)
LDL Cholesterol: 50 mg/dL (ref 0–99)
Total CHOL/HDL Ratio: 4 RATIO
Triglycerides: 91 mg/dL (ref <150)
VLDL: 18 mg/dL (ref 0–40)

## 2022-04-10 LAB — HIV ANTIBODY (ROUTINE TESTING W REFLEX): HIV Screen 4th Generation wRfx: NONREACTIVE

## 2022-04-10 LAB — VITAMIN B12: Vitamin B-12: 355 pg/mL (ref 180–914)

## 2022-04-10 LAB — ECHOCARDIOGRAM COMPLETE
AV Peak grad: 10.2 mmHg
Ao pk vel: 1.6 m/s
Area-P 1/2: 8.92 cm2
Height: 67 in
Weight: 1767.21 oz

## 2022-04-10 LAB — CLOSTRIDIUM DIFFICILE BY PCR, REFLEXED: Toxigenic C. Difficile by PCR: POSITIVE — AB

## 2022-04-10 LAB — C DIFFICILE QUICK SCREEN W PCR REFLEX
C Diff antigen: POSITIVE — AB
C Diff toxin: NEGATIVE

## 2022-04-10 LAB — VITAMIN D 25 HYDROXY (VIT D DEFICIENCY, FRACTURES): Vit D, 25-Hydroxy: 21.41 ng/mL — ABNORMAL LOW (ref 30–100)

## 2022-04-10 MED ORDER — IOHEXOL 350 MG/ML SOLN
75.0000 mL | Freq: Once | INTRAVENOUS | Status: AC | PRN
  Administered 2022-04-10: 75 mL via INTRAVENOUS

## 2022-04-10 MED ORDER — PROSOURCE PLUS PO LIQD
30.0000 mL | Freq: Three times a day (TID) | ORAL | Status: DC
  Administered 2022-04-10 – 2022-04-21 (×20): 30 mL via ORAL

## 2022-04-10 MED ORDER — ASPIRIN 81 MG PO TBEC
81.0000 mg | DELAYED_RELEASE_TABLET | Freq: Every day | ORAL | Status: DC
  Administered 2022-04-10 – 2022-04-21 (×12): 81 mg via ORAL
  Filled 2022-04-10 (×12): qty 1

## 2022-04-10 MED ORDER — VITAMIN D 25 MCG (1000 UNIT) PO TABS
2000.0000 [IU] | ORAL_TABLET | Freq: Every day | ORAL | Status: DC
  Administered 2022-04-11 – 2022-04-21 (×11): 2000 [IU] via ORAL
  Filled 2022-04-10 (×11): qty 2

## 2022-04-10 MED ORDER — VANCOMYCIN HCL 125 MG PO CAPS
125.0000 mg | ORAL_CAPSULE | Freq: Four times a day (QID) | ORAL | Status: AC
  Administered 2022-04-10 – 2022-04-20 (×40): 125 mg via ORAL
  Filled 2022-04-10 (×42): qty 1

## 2022-04-10 MED ORDER — CLOPIDOGREL BISULFATE 75 MG PO TABS
75.0000 mg | ORAL_TABLET | Freq: Every day | ORAL | Status: DC
  Administered 2022-04-10 – 2022-04-21 (×12): 75 mg via ORAL
  Filled 2022-04-10 (×12): qty 1

## 2022-04-10 MED ORDER — ADULT MULTIVITAMIN W/MINERALS CH
1.0000 | ORAL_TABLET | Freq: Every day | ORAL | Status: DC
  Administered 2022-04-10 – 2022-04-21 (×12): 1 via ORAL
  Filled 2022-04-10 (×12): qty 1

## 2022-04-10 MED ORDER — ENSURE ENLIVE PO LIQD
237.0000 mL | Freq: Three times a day (TID) | ORAL | Status: DC
  Administered 2022-04-10 – 2022-04-13 (×9): 237 mL via ORAL
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
## 2022-04-10 NOTE — Care Management (Signed)
Patient has foul smelling very loose stool.  No abdominal tenderness or leucocytosis. PCR pending. C diff antigen only positive , however given ongoing diarrhea, will treat as infection with vancomycin for 10 days
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
## 2022-04-10 NOTE — Progress Notes (Signed)
SLP Cancellation Note  Patient Details Name: Lucas Ramirez MRN: [id number] DOB: 09/19/1955   Cancelled treatment:       Reason Eval/Treat Not Completed: Patient at procedure or test/unavailable (Pt OTF for CT. Will continue efforts as able.)  Cherrie Gauze, M.S., [facility] [phone] [place])   Quintella Baton 04/10/2022, 10:02 AM

## 2022-04-10 NOTE — Progress Notes (Signed)
  Echocardiogram 2D Echocardiogram has been performed.  Claretta Fraise 04/10/2022, 1:25 PM

## 2022-04-10 NOTE — Progress Notes (Signed)
OT Cancellation Note  Patient Details Name: Lucas Ramirez MRN: [id number] DOB: Oct 23, 1955   Cancelled Treatment:    Reason Eval/Treat Not Completed: Other (comment) (transport in room to bring to CT, OT will re attempt as able)  Shanon Payor, OTD OTR/L  04/10/22, 9:33 AM

## 2022-04-11 ENCOUNTER — Telehealth (HOSPITAL_COMMUNITY): Payer: Self-pay | Admitting: Pharmacy Technician

## 2022-04-11 ENCOUNTER — Other Ambulatory Visit (HOSPITAL_COMMUNITY): Payer: Self-pay

## 2022-04-11 DIAGNOSIS — R0602 Shortness of breath: Secondary | ICD-10-CM | POA: Diagnosis not present

## 2022-04-11 LAB — PROTEIN ELECTROPHORESIS, SERUM
A/G Ratio: 1.2 (ref 0.7–1.7)
Albumin ELP: 2.5 g/dL — ABNORMAL LOW (ref 2.9–4.4)
Alpha 1: 0.3 g/dL (ref 0.0–0.4)
Alpha 2: 0.7 g/dL (ref 0.4–1.0)
Beta: 0.5 g/dL — ABNORMAL LOW (ref 0.7–1.3)
Gamma Globulin: 0.6 g/dL (ref 0.4–1.8)
Globulin, Total: 2.1 g/dL — ABNORMAL LOW (ref 2.2–3.9)

## 2022-04-11 LAB — QUANTIFERON-TB GOLD PLUS
QuantiFERON Mitogen Value: 0.91 IU/mL
QuantiFERON Nil Value: 0 IU/mL
QuantiFERON TB1 Ag Value: 0 IU/mL
QuantiFERON TB2 Ag Value: 0 IU/mL
QuantiFERON-TB Gold Plus: NEGATIVE

## 2022-04-11 LAB — CBC WITH DIFFERENTIAL/PLATELET
Basophils Absolute: 0.1 10*3/uL (ref 0.0–0.2)
Basos: 0 %
EOS (ABSOLUTE): 0 10*3/uL (ref 0.0–0.4)
Eos: 0 %
Hematocrit: 34.3 % — ABNORMAL LOW (ref 37.5–51.0)
Hemoglobin: 11.2 g/dL — ABNORMAL LOW (ref 13.0–17.7)
Immature Grans (Abs): 0.2 10*3/uL — ABNORMAL HIGH (ref 0.0–0.1)
Immature Granulocytes: 1 %
Lymphocytes Absolute: 1.1 10*3/uL (ref 0.7–3.1)
Lymphs: 5 %
MCH: 30.2 pg (ref 26.6–33.0)
MCHC: 32.7 g/dL (ref 31.5–35.7)
MCV: 93 fL (ref 79–97)
Monocytes Absolute: 1.4 10*3/uL — ABNORMAL HIGH (ref 0.1–0.9)
Monocytes: 7 %
Neutrophils Absolute: 17.5 10*3/uL — ABNORMAL HIGH (ref 1.4–7.0)
Neutrophils: 87 %
Platelets: 592 10*3/uL — ABNORMAL HIGH (ref 150–450)
RBC: 3.71 x10E6/uL — ABNORMAL LOW (ref 4.14–5.80)
RDW: 13.4 % (ref 11.6–15.4)
WBC: 20.2 10*3/uL (ref 3.4–10.8)

## 2022-04-11 LAB — COMPREHENSIVE METABOLIC PANEL
ALT: 23 IU/L (ref 0–44)
AST: 21 IU/L (ref 0–40)
Albumin/Globulin Ratio: 1.6 (ref 1.2–2.2)
Albumin: 2.8 g/dL — ABNORMAL LOW (ref 3.9–4.9)
Alkaline Phosphatase: 81 IU/L (ref 44–121)
BUN/Creatinine Ratio: 26 — ABNORMAL HIGH (ref 10–24)
BUN: 29 mg/dL — ABNORMAL HIGH (ref 8–27)
Bilirubin Total: 0.2 mg/dL (ref 0.0–1.2)
CO2: 23 mmol/L (ref 20–29)
Calcium: 7.6 mg/dL — ABNORMAL LOW (ref 8.6–10.2)
Chloride: 101 mmol/L (ref 96–106)
Creatinine, Ser: 1.13 mg/dL (ref 0.76–1.27)
Globulin, Total: 1.8 g/dL (ref 1.5–4.5)
Glucose: 95 mg/dL (ref 70–99)
Potassium: 6.2 mmol/L — ABNORMAL HIGH (ref 3.5–5.2)
Sodium: 135 mmol/L (ref 134–144)
Total Protein: 4.6 g/dL — ABNORMAL LOW (ref 6.0–8.5)
eGFR: 72 mL/min/{1.73_m2} (ref >59)

## 2022-04-11 LAB — HEMOGLOBIN A1C
Hgb A1c MFr Bld: 5.5 % (ref 4.8–5.6)
Mean Plasma Glucose: 111 mg/dL

## 2022-04-11 MED ORDER — STROKE: EARLY STAGES OF RECOVERY BOOK: Freq: Once | Status: DC

## 2022-04-11 NOTE — Progress Notes (Signed)
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 Co tx completed with PT on this date. Pt is alert, oriented to self and place, increased time required for processing with poor awareness of current level of functioning. Pt reports he does not know when he has last bathed or gotten out of bed. Pt is a poor biographical historian with some PLOF information gained from previous admissions. Pt presents with deficits in strength, endurance, activity tolerance, balance, cognition all affecting safe and optimal ADL completion. At this time recommend discharge to STR to address functional deficits.    Of note: HR up to 120 bpm with short amb transfer to bsc, spo2 >90% on 2 L via [place] throughout.       Recommendations for follow up therapy are one component of a multi-disciplinary discharge planning process, led by the attending physician.  Recommendations may be updated based on patient status, additional functional criteria and insurance authorization.        Follow Up Recommendations   Skilled nursing-short term rehab (<3 hours/day)        Assistance Recommended at Discharge Frequent or constant Supervision/Assistance   Patient can return home with the following A lot of help with walking and/or transfers;A lot of help with bathing/dressing/bathroom       Functional Status Assessment   Patient  has had a recent decline in their functional status and demonstrates the ability to make significant improvements in function in a reasonable and predictable amount of time.   Equipment Recommendations   Other (comment) (defer)      Recommendations for Other Services        Precautions / Restrictions Precautions Precautions: Fall Restrictions Weight Bearing Restrictions: No         Mobility Bed Mobility Overal bed mobility: Needs Assistance Bed Mobility: Supine to Sit Supine to sit: Mod assist, +2 for physical assistance   Transfers Overall transfer level: Needs assistance Equipment used: Rolling walker (2 wheels) Transfers: Sit to/from Stand Sit to Stand: Min assist, +2 safety/equipment General transfer comment: step by step vcs for sequencing, safety      Balance Overall balance assessment: Needs assistance Sitting-balance support: Feet supported Sitting balance-Leahy Scale: Fair Standing balance support: Bilateral upper extremity supported, During functional activity, Reliant on assistive device for balance Standing balance-Leahy Scale: Fair    ADL either performed or assessed with clinical judgement    ADL Overall ADL's : Needs assistance/impaired Eating/Feeding: Set up;Sitting Grooming: Wash/dry face;Sitting;Set up Lower Body Dressing: Maximal assistance Toilet Transfer: Minimal assistance;Rolling walker (2 wheels);+2 for safety/equipment Toilet Transfer Details (indicate cue type and reason): short amb transfer Toileting- Clothing Manipulation and Hygiene: Maximal assistance;Sit to/from stand        Vision Patient Visual Report: No change from baseline (L eye ?stiched shut)       Perception    Praxis      Pertinent Vitals/Pain Pain Assessment Pain Assessment:  Faces Faces Pain Scale: Hurts little more Pain Location: generalized Pain Descriptors / Indicators: Grimacing Pain Intervention(s): Monitored during session, Repositioned        Hand Dominance     Extremity/Trunk Assessment Upper Extremity Assessment Upper Extremity Assessment: Generalized weakness Lower Extremity Assessment Lower Extremity Assessment: Generalized weakness    Communication Communication Communication: No difficulties    Cognition Arousal/Alertness: Lethargic Behavior During Therapy: Flat affect Overall Cognitive Status: No family/caregiver present to determine baseline cognitive functioning Area of Impairment: Orientation, Attention, Memory, Following commands, Safety/judgement, Awareness, Problem solving Orientation Level: Disoriented to, Time, Situation Current Attention Level: Focused Memory: Decreased short-term memory Following Commands: Follows one step commands with increased time Safety/Judgement: Decreased awareness of safety, Decreased awareness of deficits Awareness: Intellectual Problem Solving: Decreased initiation, Slow processing, Difficulty sequencing, Requires verbal cues, Requires tactile cues    General Comments             pt with ?burns to R thigh, noted scoliosis      Exercises Other Exercises Other Exercises: edu re: role of OT, role of rehab, discharge recommendations, home safety, falls prevention    Shoulder Instructions       Home Living Family/patient expects to be discharged to:: Private residence Living Arrangements: Spouse/significant other Available Help at Discharge: Family;Available PRN/intermittently Type of Home: House Home Access: Stairs to enter [employer] of Steps: 4 Home Layout: One level Bathroom Shower/Tub: Multimedia programmer: Handicapped height Home Equipment: Rollator (4 wheels) Additional Comments: pt is poor historian today, some information taken from prior admissions; will need to clarify      Prior Functioning/Environment Prior Level of Function : Patient poor historian/Family not available Mobility Comments: pt reports he can't remember when he was out of bed last, amb with  rolling walker only in house; reports he can't remember the last time he left his house ADLs Comments: pt reports requiring assist for all ADL/IADL, cannot remember last time he bathed             OT Problem List: Decreased strength;Decreased activity tolerance;Impaired balance (sitting and/or standing);Decreased safety awareness;Decreased cognition;Decreased knowledge of use of DME or AE         OT Treatment/Interventions: Self-care/ADL training;Patient/family education;Therapeutic exercise;Balance training;Energy conservation;Therapeutic activities;DME and/or AE instruction     OT Goals(Current goals can be found in the care plan section) Acute Rehab OT Goals Patient Stated Goal: get stronger OT Goal Formulation: With patient Time For Goal Achievement: 04/24/22 Potential to Achieve Goals: Good  OT Frequency: Min 2X/week      Co-evaluation PT/OT/SLP Co-Evaluation/Treatment: Yes Reason for Co-Treatment: For patient/therapist safety;Necessary to address cognition/behavior during functional activity;To address functional/ADL transfers OT goals addressed during session: ADL's and self-care      AM-PAC OT "6 Clicks" Daily Activity     Outcome Measure Help from another person eating meals?: None Help from another person taking care of personal grooming?: A Little Help from another person toileting, which includes using toliet, bedpan, or urinal?: A Lot Help from another person bathing (including washing, rinsing, drying)?: A Lot Help from another person to put on and taking off regular upper body clothing?: A Little Help from another person to put on and taking off regular lower body clothing?: A Lot 6 Click Score: 16    End of Session Equipment Utilized During Treatment: Rolling walker (2 wheels);Oxygen Nurse Communication: Mobility status   Activity Tolerance: Patient tolerated treatment well Patient left: in chair;with call bell/phone within reach;with chair alarm set   OT  Visit  Diagnosis: Unsteadiness on feet (R26.81);Muscle weakness (generalized) (M62.81)                                                                                                                            Time: 2023-3435 OT Time Calculation (min): 20 min Charges:  OT General Charges $OT Visit: 1 Visit Lucas Ramirez, OTD OTR/L  04/10/22, 11:33 AM        Note unintentionally left pended; pulled through to formal note by Tera Helper, PT (Rehab Supervisor).  Evaluation, documentation completed by Lucas Ramirez, OT as note indicates.  Sudie Bandel H. Owens Shark, PT, DPT, NCS 04/11/22, 9:29 PM [phone]

## 2022-04-11 NOTE — TOC Benefit Eligibility Note (Signed)
Patient Teacher, English as a foreign language completed.    The patient is currently admitted and upon discharge could be taking vancomycin 125 mc capsule.  Prior Authorization Required  The patient is insured through [place], [place] Patient Advocate Specialist [place] Patient Advocate Team Direct Number: [phone]  Fax: [phone]

## 2022-04-11 NOTE — Evaluation (Addendum)
Clinical/Bedside Swallow Evaluation Patient Details  Name: Lucas Ramirez MRN: [id number] Date of Birth: 1956/03/16  Today's Date: 04/11/2022 Time: SLP Start Time (ACUTE ONLY): 0815 SLP Stop Time (ACUTE ONLY): 0832 SLP Time Calculation (min) (ACUTE ONLY): 17 min  Past Medical History:  Past Medical History:  Diagnosis Date   Bell's palsy    CAP (community acquired pneumonia) 06/28/2015   COPD (chronic obstructive pulmonary disease) ([place])    Depression    GERD (gastroesophageal reflux disease)    Glaucoma    Hyperlipidemia    Hypertension    Migraine    Scoliosis    Shingles 2019   dx by dermatologist by patient report   Past Surgical History:  Past Surgical History:  Procedure Laterality Date   BACK SURGERY  1963   correcting [place] per pt   COLONOSCOPY WITH PROPOFOL N/A 07/04/2019   Procedure: COLONOSCOPY WITH PROPOFOL;  Surgeon: Lin Landsman, MD;  Location: [facility];  Service: Gastroenterology;  Laterality: N/A;  PRIORITY 4   LUMBAR LAMINECTOMY/ DECOMPRESSION WITH MET-RX Right 10/30/2019   Procedure: L4-5 DECOMPRESSION;  Surgeon: Meade Maw, MD;  Location: [facility];  Service: Neurosurgery;  Laterality: Right;   SKIN LESION EXCISION  06/06/2013   facial left cheek. Dr. Evorn Gong   UPPER GI ENDOSCOPY  07/11/2008   [place]. Dr. Donnella Sham. Impression. -LA Grade C reflux esophagitis. 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   HPI:  Per H&P "Lucas Ramirez is a 66 y.o. male with medical history significant of COPD and chronic smoker, chronic back pain on oxycodone, Bell's palsy, hypertension, hyperlipidemia, GERD, poor health maintenance, cachexia of unknown cause and extensively investigated with his primary care physician's office, history of stroke currently on Plavix brought from home with confusion, lethargy, shortness of breath ongoing for more than a month and worse for the last few days.  Patient tells me he does have shortness of breath  but that has been chronic.  Patient tells me that he cannot walk these days because of extreme weakness.  He continues to smoke laying in the bed, falls asleep with smoking and burns his skin.  Denies any alcohol or drug use.  His symptoms are gradually worsening over the last 2 years but recently has gotten very bad.  Reviewed his outpatient records, he had been investigated and recently started on Megace and Remeron with no effects.  Appetite is poor and has really not eaten good portion of meal for almost a month.  Anorexia, progressive debility, unable to move around, chronic swelling of both legs.  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 No  hemorrhage or mass effect."    Assessment / Plan / Recommendation  Clinical Impression  Pt seen for clinical swallowing evaluation. Pt alert. Flat affect. Oriented to self only. Slow to respond at times. Cachexic.Congested baseline cough noted. On 2L/min O2 via [place]. Cleared with RN.   Oral motor examination completed and remarkable for L facial droop (chronic Bell's Palsy per EMR), R eye suture, and some missing teeth.  Pt given trials of solid, pureed, and thin liquids. Pt with s/sx mild oral dysphagia c/b prolonged mastication of solids. Oral deficits likely due to missing dentition and exacerbated by deconditioned state and mental status. Pt with increased WOB with mastication which may increase reduce his ability to protect his airway while swallowing.   Recommend a mech soft diet with thin liquids with safe swallowing strategies/aspiration precautions as outlined below. Pt would benefit  from RD consult given concern for ability to meet nutritional needs PO.   Pt may also benefit from cognitive-linguistic evaluation given apparent cognitive deficits in setting of stroke.   SLP to f/u per POC for diet tolerance and cognitive-linguistic evaluation, as indicated. Anticipate need for frequent/constant  supervision/assistance at d/c as well as post-acute SLP services.   Pt and RN made aware of results, recommendations, and SLP POC. Reinforced relationship between breathing/swallowing with pt. Suspect need for reinforcement of content.   SLP Visit Diagnosis: Dysphagia, oral phase (R13.11)    Aspiration Risk  Mild aspiration risk    Diet Recommendation Dysphagia 3 (Mech soft);Thin liquid   Medication Administration: Whole meds with liquid (as tolerated) Supervision: Patient able to self feed;Intermittent supervision to cue for compensatory strategies (initial reminders) Compensations: Minimize environmental distractions;Slow rate;Small sips/bites (rest breaks PRN) Postural Changes: Seated upright at 90 degrees;Remain upright for at least 30 minutes after po intake    Other  Recommendations Recommended Consults:  (Registered Dietician) Oral Care Recommendations: Oral care QID;Patient independent with oral care (set up)    Recommendations for follow up therapy are one component of a multi-disciplinary discharge planning process, led by the attending physician.  Recommendations may be updated based on patient status, additional functional criteria and insurance authorization.  Follow up Recommendations Skilled nursing-short term rehab (<3 hours/day)      Assistance Recommended at Discharge    Functional Status Assessment Patient has had a recent decline in their functional status and demonstrates the ability to make significant improvements in function in a reasonable and predictable amount of time.  Frequency and Duration min 2x/week  2 weeks       Prognosis Prognosis for Safe Diet Advancement: Fair Barriers to Reach Goals: Severity of deficits;Cognitive deficits      Swallow Study   General Date of Onset: 04/09/22 HPI: Per H&P "Lucas Ramirez is a 66 y.o. male with medical history significant of COPD and chronic smoker, chronic back pain on oxycodone, Bell's palsy, hypertension,  hyperlipidemia, GERD, poor health maintenance, cachexia of unknown cause and extensively investigated with his primary care physician's office, history of stroke currently on Plavix brought from home with confusion, lethargy, shortness of breath ongoing for more than a month and worse for the last few days.  Patient tells me he does have shortness of breath but that has been chronic.  Patient tells me that he cannot walk these days because of extreme weakness.  He continues to smoke laying in the bed, falls asleep with smoking and burns his skin.  Denies any alcohol or drug use.  His symptoms are gradually worsening over the last 2 years but recently has gotten very bad.  Reviewed his outpatient records, he had been investigated and recently started on Megace and Remeron with no effects.  Appetite is poor and has really not eaten good portion of meal for almost a month.  Anorexia, progressive debility, unable to move around, chronic swelling of both legs.  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 No  hemorrhage or mass effect." Type of Study: Bedside Swallow Evaluation Diet Prior to this Study: Regular;Thin liquids Temperature Spikes Noted: Yes (WBC 11.9) Respiratory Status: Nasal cannula History of Recent Intubation: No Behavior/Cognition: Alert;Requires cueing;Confused Oral Cavity Assessment: Dry Oral Care Completed by SLP: Yes Oral Cavity - Dentition: Missing dentition Vision: Functional for self-feeding Self-Feeding Abilities: Able to feed self Patient Positioning: Upright in bed Baseline Vocal Quality: Normal Volitional Cough:  Strong;Congested Volitional Swallow: Able to elicit    Oral/Motor/Sensory Function Overall Oral Motor/Sensory Function: Mild impairment Facial ROM: Reduced left Facial Symmetry: Abnormal symmetry left Lingual ROM: Within Functional Limits Lingual Strength: Within Functional Limits Velum: Within Functional  Limits Mandible: Within Functional Limits   Ice Chips Ice chips: Not tested   Thin Liquid Thin Liquid: Within functional limits Presentation: Straw Other Comments: ~3 oz    Nectar Thick Nectar Thick Liquid: Not tested   Honey Thick Honey Thick Liquid: Not tested   Puree Puree: Within functional limits Presentation: Self Fed Other Comments: ~4 oz   Solid     Solid: Impaired Oral Phase Impairments: Impaired mastication Oral Phase Functional Implications: Impaired mastication Pharyngeal Phase Impairments:  (increased WOB with mastication)      Clearnce Sorrel [place] Skalicky 04/11/2022,9:40 AM

## 2022-04-11 NOTE — Hospital Course (Signed)
66 y.o. male with medical history significant of COPD and chronic smoker, chronic back pain on oxycodone, Bell's palsy, hypertension, hyperlipidemia, GERD, poor health maintenance, cachexia of unknown cause and extensively investigated with his primary care physician's office, history of stroke currently on Plavix brought from home with confusion, lethargy, shortness of breath ongoing for more than a month and worse for the last few days.  Patient tells me he does have shortness of breath but that has been chronic.  Patient tells me that he cannot walk these days because of extreme weakness.  He continues to smoke laying in the bed, falls asleep with smoking and burns his skin.  Denies any alcohol or drug use.  His symptoms are gradually worsening over the last 2 years but recently has gotten very bad. Reviewed his outpatient records, he had been investigated and recently started on Megace and Remeron with no effects.  Appetite is poor and has really not eaten good portion of meal for almost a month. Anorexia, progressive debility, unable to move around, chronic swelling of both legs.  Ongoing back pain with sciatica.

## 2022-04-11 NOTE — Telephone Encounter (Signed)
Patient Advocate Encounter  Prior Authorization for Vancomycin HCl '125MG'$  capsules has been approved.    PA# [id number] Key: [id number] Effective dates: 04/11/2022 through 04/25/2023  Patients co-pay is $10.00.     Lyndel Safe, [place] Dawnita Molner Patient Advocate Specialist [place] Patient Advocate Team Direct Number: [phone]  Fax: [phone]

## 2022-04-11 NOTE — Progress Notes (Signed)
Physical Therapy Treatment Patient Details Name: Lucas Ramirez MRN: [id number] DOB: 1955-11-07 Today's Date: 04/11/2022   History of Present Illness 'Lucas Gulling' Ramirez is a 44IHK comes to [facility] 04/09/22 with SOB, elevated HR, respirations. Imaging revealing of medium-sized acute infarct within the right frontal lobe. PMH significant for COPD, chronic smoker, chronic back pain on oxycodone, Bell's palsy, hypertension, hyperlipidemia, GERD, poor health maintenance, cachexia of unknown cause.    PT Comments    Pt in bed asleep on entry, easily awake and agreeable to session. Pt assisted to EOB c maxA then participated in exercises at EOB requiring active assistance to perform all reps with full ROM and adequate isometric control. Pt is on room air throughout with sats a 93%. Pt able to perform some lateral scooting along EOB, but is easily fatigued with these effort and needs recovery intervals. Pt assisted back into bed at end of session, HOB elevated- snacks retrieved as requested. Will continue to follow.      Recommendations for follow up therapy are one component of a multi-disciplinary discharge planning process, led by the attending physician.  Recommendations may be updated based on patient status, additional functional criteria and insurance authorization.  Follow Up Recommendations  Skilled nursing-short term rehab (<3 hours/day) Can patient physically be transported by private vehicle: No   Assistance Recommended at Discharge Frequent or constant Supervision/Assistance  Patient can return home with the following A lot of help with walking and/or transfers;A lot of help with bathing/dressing/bathroom;Direct supervision/assist for medications management;Help with stairs or ramp for entrance;Assist for transportation;Direct supervision/assist for financial management;Assistance with cooking/housework   Equipment Recommendations       Recommendations for Other Services       Precautions /  Restrictions Precautions Precautions: Fall Restrictions Weight Bearing Restrictions: No     Mobility  Bed Mobility Overal bed mobility: Needs Assistance Bed Mobility: Supine to Sit, Sit to Supine     Supine to sit: Max assist Sit to supine: Max assist   General bed mobility comments: can balance once at EOB    Transfers Overall transfer level:  (deferred due to severe weakness of legs)                      Ambulation/Gait                   Stairs             Wheelchair Mobility    Modified Rankin (Stroke Patients Only)       Balance Overall balance assessment: Needs assistance                                          Cognition Arousal/Alertness: Lethargic Behavior During Therapy: Flat affect Overall Cognitive Status: Within Functional Limits for tasks assessed                                          Exercises General Exercises - Lower Extremity Long Arc Quad: AAROM, Both, 10 reps, Seated Hip Flexion/Marching: AAROM, Both, 10 reps, Seated Heel Raises: AROM, Both, 15 reps, Seated Other Exercises Other Exercises: AA/ROM marching -> AR/ROM hip/knee extension into authors hand 1x12 bilat Other Exercises: lateral scooting along EOB 6 scoots left, rest interval, 6 scoots right  General Comments        Pertinent Vitals/Pain Pain Assessment Pain Assessment: 0-10 Pain Score: 7  Pain Location: Rt heel Pain Intervention(s): Limited activity within patient's tolerance, Monitored during session    Home Living                          Prior Function            PT Goals (current goals can now be found in the care plan section) Acute Rehab PT Goals PT Goal Formulation: With patient Time For Goal Achievement: 04/24/22 Potential to Achieve Goals: Good Progress towards PT goals: Progressing toward goals    Frequency    7X/week      PT Plan Current plan remains appropriate     Co-evaluation              AM-PAC PT "6 Clicks" Mobility   Outcome Measure  Help needed turning from your back to your side while in a flat bed without using bedrails?: A Lot Help needed moving from lying on your back to sitting on the side of a flat bed without using bedrails?: A Lot Help needed moving to and from a bed to a chair (including a wheelchair)?: A Lot Help needed standing up from a chair using your arms (e.g., wheelchair or bedside chair)?: A Lot Help needed to walk in hospital room?: A Lot Help needed climbing 3-5 steps with a railing? : A Lot 6 Click Score: 12    End of Session Equipment Utilized During Treatment: Oxygen Activity Tolerance: Patient limited by fatigue;Patient tolerated treatment well Patient left: in bed;with call bell/phone within reach;with bed alarm set Nurse Communication: Mobility status PT Visit Diagnosis: Unsteadiness on feet (R26.81);Other abnormalities of gait and mobility (R26.89);Muscle weakness (generalized) (M62.81)     Time: 1528-1600 PT Time Calculation (min) (ACUTE ONLY): 32 min  Charges:  $Therapeutic Exercise: 8-22 mins $Neuromuscular Re-education: 8-22 mins                    4:17 PM, 04/11/22 Lucas Ramirez, PT, DPT Physical Therapist - [facility]  [phone] ([place])   Lucas Ramirez C 04/11/2022, 4:15 PM
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
## 2022-04-12 DIAGNOSIS — Z7189 Other specified counseling: Secondary | ICD-10-CM

## 2022-04-12 DIAGNOSIS — R0602 Shortness of breath: Secondary | ICD-10-CM | POA: Diagnosis not present

## 2022-04-12 LAB — GLUCOSE, CAPILLARY: Glucose-Capillary: 144 mg/dL — ABNORMAL HIGH (ref 70–99)

## 2022-04-12 LAB — VITAMIN K1, SERUM: VITAMIN K1: <0.1 ng/mL — ABNORMAL LOW (ref 0.10–2.20)

## 2022-04-12 MED ORDER — GUAIFENESIN 100 MG/5ML PO LIQD: 5.0000 mL | ORAL | Status: DC | PRN

## 2022-04-12 MED ORDER — SENNOSIDES-DOCUSATE SODIUM 8.6-50 MG PO TABS: 1.0000 | ORAL_TABLET | Freq: Every evening | ORAL | Status: DC | PRN

## 2022-04-12 MED ORDER — TRAZODONE HCL 50 MG PO TABS: 50.0000 mg | ORAL_TABLET | Freq: Every evening | ORAL | Status: DC | PRN
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
## 2022-04-12 NOTE — Plan of Care (Signed)
GOC consult noted. Patient is sleeping sitting in chair. Spoke with wife, continue care at this time. I will follow up with him tomorrow. Wife is amenable to whatever he would want. Full note to follow.

## 2022-04-12 NOTE — Progress Notes (Signed)
Physical Therapy Treatment Patient Details Name: Lucas Ramirez MRN: [id number] DOB: [birth year] Today's Date: 04/12/2022   History of Present Illness 'Clair Gulling' Vasques is a 3OZY comes to [facility] 04/09/22 with SOB, elevated HR, respirations, AMS. Imaging revealing of medium-sized acute infarct within the right frontal lobe. PMH significant for COPD, chronic smoker, chronic back pain on oxycodone, Bell's palsy, HTN, HLD, GERD, poor health maintenance, cachexia of unknown cause, chronic DJD spine, s/p chilhood scoliosis surgery. For past several months, pt has been essentially confined to the couch/recliner after a long period of weakness and recurrent falls, pt has become increasingly weak/immobile. Pt lives with with and SIL who has down's syndrome. Wife has had medical issues this year which has made it difficult for her to care for pt. Wife's 2 sons have since moved in to help provide care.    PT Comments    Pt asleep in recliner on entry, has slide down quite a bit, back of chair only providing support to head. 4L O2 donned. Pt sounds more wet with breathing, but remains saturated at 91% on room air in session. Mentation and alertness seem worse today. Pt remains globally remarkably weak, ultimately requires totalA for transfer back to bed and transition to supine. Obtained an image of Rt heel ulcer seen yesterday to monitor progression for EMR. Friend of family Santiago Glad visits during our session, expresses concerns over how deconditioned pt is currently, in the setting of watching him decline over the past year. Pt sitting up in bed at EOS, RN in room.       Recommendations for follow up therapy are one component of a multi-disciplinary discharge planning process, led by the attending physician.  Recommendations may be updated based on patient status, additional functional criteria and insurance authorization.  Follow Up Recommendations  Skilled nursing-short term rehab (<3 hours/day) Can patient  physically be transported by private vehicle: No   Assistance Recommended at Discharge Frequent or constant Supervision/Assistance  Patient can return home with the following Direct supervision/assist for medications management;Help with stairs or ramp for entrance;Assist for transportation;Direct supervision/assist for financial management;Assistance with cooking/housework;Two people to help with walking and/or transfers;Two people to help with bathing/dressing/bathroom   Equipment Recommendations  None recommended by PT    Recommendations for Other Services       Precautions / Restrictions Precautions Precautions: Fall Precaution Comments: Rt heel wound Restrictions Weight Bearing Restrictions: No     Mobility  Bed Mobility   Bed Mobility: Sit to Supine       Sit to supine: Total assist, +2 for physical assistance        Transfers Overall transfer level: Needs assistance Equipment used: None   Sit to Stand: Total assist           General transfer comment: attempted inititally with maxA and RW, both physically and cognitively inappropriate; resolved to dependent transfer to get pt back to bed.    Ambulation/Gait Ambulation/Gait assistance:  (incredibly weak, unable to safely attempt)                 Stairs             Wheelchair Mobility    Modified Rankin (Stroke Patients Only)       Balance  Cognition Arousal/Alertness: Lethargic   Overall Cognitive Status: Impaired/Different from baseline (author attempting to talk pt through calling in dinner. He needs heavy cues to do this, struggles to identify phone among 4 objects in bed.)                                          Exercises Other Exercises Other Exercises: heavy cues for repositioning self in chair Other Exercises: cognitive remediation of using phone to call in dinner, largely unsuccessful     General Comments        Pertinent Vitals/Pain Pain Assessment Pain Assessment:  (Rt foot pain)    Home Living                          Prior Function            PT Goals (current goals can now be found in the care plan section) Acute Rehab PT Goals PT Goal Formulation: With patient Time For Goal Achievement: 04/24/22 Potential to Achieve Goals: Good Progress towards PT goals: Not progressing toward goals - comment    Frequency    7X/week      PT Plan Current plan remains appropriate    Co-evaluation              AM-PAC PT "6 Clicks" Mobility   Outcome Measure  Help needed turning from your back to your side while in a flat bed without using bedrails?: Total Help needed moving from lying on your back to sitting on the side of a flat bed without using bedrails?: Total Help needed moving to and from a bed to a chair (including a wheelchair)?: Total Help needed standing up from a chair using your arms (e.g., wheelchair or bedside chair)?: Total Help needed to walk in hospital room?: Total Help needed climbing 3-5 steps with a railing? : Total 6 Click Score: 6    End of Session Equipment Utilized During Treatment: Oxygen Activity Tolerance: Patient limited by fatigue;Patient tolerated treatment well Patient left: in bed;with call bell/phone within reach;with bed alarm set;with nursing/sitter in room Nurse Communication: Mobility status PT Visit Diagnosis: Unsteadiness on feet (R26.81);Other abnormalities of gait and mobility (R26.89);Muscle weakness (generalized) (M62.81)     Time: 1635-1700 PT Time Calculation (min) (ACUTE ONLY): 25 min  Charges:  $Therapeutic Activity: 23-37 mins                    5:12 PM, 04/12/22 [name], PT, DPT Physical Therapist - [facility]  [phone] ([place])    Trayce Maino C 04/12/2022, 5:09 PM

## 2022-04-12 NOTE — Progress Notes (Signed)
Blister/wound area right heel. MD made aware and wound consult in place.

## 2022-04-13 DIAGNOSIS — R64 Cachexia: Secondary | ICD-10-CM

## 2022-04-13 DIAGNOSIS — R0602 Shortness of breath: Secondary | ICD-10-CM | POA: Diagnosis not present

## 2022-04-13 DIAGNOSIS — Z7189 Other specified counseling: Secondary | ICD-10-CM | POA: Diagnosis not present

## 2022-04-13 DIAGNOSIS — I679 Cerebrovascular disease, unspecified: Secondary | ICD-10-CM

## 2022-04-13 LAB — CBC
HCT: 26.8 % — ABNORMAL LOW (ref 39.0–52.0)
Hemoglobin: 8.7 g/dL — ABNORMAL LOW (ref 13.0–17.0)
MCH: 30.4 pg (ref 26.0–34.0)
MCHC: 32.5 g/dL (ref 30.0–36.0)
MCV: 93.7 fL (ref 80.0–100.0)
Platelets: 437 10*3/uL — ABNORMAL HIGH (ref 150–400)
RBC: 2.86 MIL/uL — ABNORMAL LOW (ref 4.22–5.81)
RDW: 15.7 % — ABNORMAL HIGH (ref 11.5–15.5)
WBC: 9.8 10*3/uL (ref 4.0–10.5)
nRBC: 0 % (ref 0.0–0.2)

## 2022-04-13 LAB — BASIC METABOLIC PANEL
Anion gap: 2 — ABNORMAL LOW (ref 5–15)
BUN: 18 mg/dL (ref 8–23)
CO2: 28 mmol/L (ref 22–32)
Calcium: 7.7 mg/dL — ABNORMAL LOW (ref 8.9–10.3)
Chloride: 106 mmol/L (ref 98–111)
Creatinine, Ser: 0.69 mg/dL (ref 0.61–1.24)
GFR, Estimated: >60 mL/min (ref >60)
Glucose, Bld: 147 mg/dL — ABNORMAL HIGH (ref 70–99)
Potassium: 5.1 mmol/L (ref 3.5–5.1)
Sodium: 136 mmol/L (ref 135–145)

## 2022-04-13 LAB — VITAMIN E
Vitamin E (Alpha Tocopherol): 5.4 mg/L — ABNORMAL LOW (ref 9.0–29.0)
Vitamin E(Gamma Tocopherol): 0.6 mg/L (ref 0.5–4.9)

## 2022-04-13 LAB — GLUCOSE, CAPILLARY
Glucose-Capillary: 130 mg/dL — ABNORMAL HIGH (ref 70–99)
Glucose-Capillary: 153 mg/dL — ABNORMAL HIGH (ref 70–99)
Glucose-Capillary: 170 mg/dL — ABNORMAL HIGH (ref 70–99)

## 2022-04-13 LAB — VITAMIN A: Vitamin A (Retinoic Acid): 28.6 ug/dL (ref 22.0–69.5)

## 2022-04-13 LAB — VITAMIN C: Vitamin C: 0.8 mg/dL (ref 0.4–2.0)

## 2022-04-13 LAB — VITAMIN B6: Vitamin B6: 2.4 ug/L — ABNORMAL LOW (ref 3.4–65.2)

## 2022-04-13 LAB — MAGNESIUM: Magnesium: 1.9 mg/dL (ref 1.7–2.4)

## 2022-04-13 LAB — VITAMIN B1: Vitamin B1 (Thiamine): 126.7 nmol/L (ref 66.5–200.0)

## 2022-04-13 MED ORDER — FERROUS SULFATE 325 (65 FE) MG PO TABS
325.0000 mg | ORAL_TABLET | ORAL | Status: DC
  Administered 2022-04-14 – 2022-04-20 (×4): 325 mg via ORAL
  Filled 2022-04-13 (×4): qty 1

## 2022-04-13 MED ORDER — ENSURE ENLIVE PO LIQD
237.0000 mL | Freq: Four times a day (QID) | ORAL | Status: DC
  Administered 2022-04-13 – 2022-04-21 (×27): 237 mL via ORAL

## 2022-04-13 MED ORDER — SILVER SULFADIAZINE 1 % EX CREA
TOPICAL_CREAM | Freq: Every day | CUTANEOUS | Status: DC
  Filled 2022-04-13: qty 85
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
## 2022-04-13 NOTE — Progress Notes (Addendum)
Daily Progress Note   Patient Name: Lucas Ramirez       Date: 04/13/2022 DOB: [birth year]  Age: 66 y.o. MRN#: [id number] Attending Physician: Jennye Boroughs, MD Primary Care Physician: Birdie Sons, MD Admit Date: 04/09/2022  Reason for Consultation/Follow-up: Establishing goals of care  Subjective: Notes and labs reviewed.  In to see patient.  He is very sleepy during conversation.  He sounds very congested with a moist cough.   He states that he lives at home with his wife.  He states he uses a walker at baseline; he discusses his deconditioning and weakness.  He states he spends much of his time sitting and reading books, and sleeping.   We discussed his diagnoses, prognosis, GOC, EOL wishes disposition and options.  Created space and opportunity for patient  to explore thoughts and feelings regarding current medical information.   A detailed discussion was had today regarding advanced directives.  Concepts specific to code status, artifical feeding and hydration, IV antibiotics and rehospitalization were discussed.  The difference between an aggressive medical intervention path and a comfort care path was discussed.  Values and goals of care important to patient and family were attempted to be elicited.  Discussed limitations of medical interventions to prolong quality of life in some situations and discussed the concept of human mortality.  Discussed his narcotic use for chronic pain, and burns from cigarettes.  Discussed his sleepiness at home.  Discussed his poor p.o. intake that is not sufficient to sustain his body.  Discussed the side effects of narcotic medications and how it can affect his current diagnoses.   He initially states that he would want all care to sustain him. He  states he would like to be able to do things again with his wife. Discussed the prospect of a feeding tube due to nutrition, and possible NPO status depending on the outcome of his swallow evaluation.  Discussed the need for rehab due to his deconditioning.  Discussed seeing a chronic pain specialist for multimodal therapy and working to decrease or discontinue his narcotic use given the side effects to his health and QOL, in addition to his family's life and safety with respect to concerns that have been brought forward regarding the possibility of accidentally setting himself or the house on fire.  Discussed his smoking  and the prospect of an inability to smoke if it cannot be done safely.  With thought, patient states perhaps focusing on comfort care and going home with hospice might be the best plan.  He states he would like to speak with his wife about this.  I offered to assist him with calling his wife on his cell phone at bedside, but he states that she should be here a bit later and he will speak with her then.  Discussed that I would follow-up with him and that I would follow-up with his wife.  Called to speak with his wife.  Discussed our conversation and different trajectories and scenarios.  She states she will speak with him this afternoon.  Discussed that I would follow-up tomorrow.     PMT will follow-up tomorrow.   Length of Stay: 3  Current Medications: Scheduled Meds:    stroke: early stages of recovery book   Does not apply Once   (feeding supplement) PROSource Plus  30 mL Oral TID BM   aspirin EC  81 mg Oral Daily   calcium carbonate  500 mg of elemental calcium Oral BID WC   cholecalciferol  2,000 Units Oral Daily   clopidogrel  75 mg Oral Daily   enoxaparin (LOVENOX) injection  40 mg Subcutaneous Q24H   feeding supplement  237 mL Oral TID BM   gabapentin  800 mg Oral TID   latanoprost  1-2 drop Both Eyes QHS   megestrol  400 mg Oral BID   mirtazapine  7.5 mg Oral QHS    multivitamin with minerals  1 tablet Oral Daily   nicotine  21 mg Transdermal Daily   pantoprazole  40 mg Oral Daily   silver sulfADIAZINE   Topical Daily   vancomycin  125 mg Oral QID    Continuous Infusions:   PRN Meds: acetaminophen **OR** acetaminophen, albuterol, guaiFENesin, ondansetron **OR** ondansetron (ZOFRAN) IV, oxyCODONE, senna-docusate, traZODone  Physical Exam Pulmonary:     Effort: Pulmonary effort is normal.  Neurological:     Mental Status: He is alert.             Vital Signs: BP (!) 158/77 (BP Location: Left Arm)   Pulse 85   Temp 98.9 F (37.2 C)   Resp 16   Ht '5\' 7"'$  (1.702 m)   Wt 71.1 kg   SpO2 100%   BMI 24.55 kg/m  SpO2: SpO2: 100 % O2 Device: O2 Device: Nasal Cannula O2 Flow Rate: O2 Flow Rate (L/min): 2 L/min  Intake/output summary:  Intake/Output Summary (Last 24 hours) at 04/13/2022 1222 Last data filed at 04/13/2022 0900 Gross per 24 hour  Intake 360 ml  Output 1000 ml  Net -640 ml   LBM: Last BM Date : 04/11/22 Baseline Weight: Weight: 54.4 kg Most recent weight: Weight: 71.1 kg    Patient Active Problem List   Diagnosis Date Noted   Pressure injury of skin 04/10/2022   Shortness of breath 04/09/2022   Cachexia ([place]) 04/09/2022   Sepsis due to pneumonia ([place]) 08/14/2021   Hyponatremia 08/14/2021   Hypertensive urgency 08/14/2021   History of CVA (cerebrovascular accident) 08/14/2021   Dyslipidemia 08/14/2021   Tobacco dependence 06/01/2021   Hypertension, essential 06/01/2021   Carotid stenosis 09/29/2020   Cerebrovascular disease 08/31/2020   History of CVA with residual deficit 08/31/2020   Edema 08/31/2020   Stroke (cerebrum) ([place]) 08/21/2020   Visual loss 08/19/2020   History of adenomatous polyp of colon 07/05/2019  Lymphadenopathy 03/31/2019   Vitamin B6 deficiency 11/28/2018   CAP (community acquired pneumonia) 06/28/2015   COPD exacerbation ([place]) 06/28/2015   HTN (hypertension) 06/28/2015   Back pain,  chronic 12/24/2014   Duodenitis 12/24/2014   Esophagitis 12/24/2014   H/O surgical procedure 12/24/2014   HLD (hyperlipidemia) 12/24/2014   Headache, migraine 12/24/2014   Neuropathy 12/24/2014   Borderline diabetes 12/24/2014   Scoliosis 12/24/2014   Compulsive tobacco user syndrome 12/24/2014   Vitamin D deficiency 12/24/2014   Bell's palsy 12/24/2014   Polyneuropathy 12/24/2014    Palliative Care Assessment & Plan    Recommendations/Plan: Patient states he would like to speak with his wife regarding care moving forward.  Offered to remain at bedside while patient called his wife on his phone, however he states he will speak to her when she comes later today. PMT will follow-up tomorrow  Code Status:    Code Status Orders  (From admission, onward)           Start     Ordered   04/09/22 1741  Full code  Continuous       Question:  By:  Answer:  Consent: discussion documented in EHR   04/09/22 1741           Code Status History     Date Active Date Inactive Code Status Order ID Comments User Context   08/14/2021 0409 08/17/2021 1715 Full Code [id number]  Sidney Ace Arvella Merles, MD Inpatient   08/19/2020 1238 08/21/2020 1451 DNR [id number]  Kayleen Memos, DO ED   06/28/2015 1231 07/02/2015 1416 Full Code [id number]  Idelle Crouch, MD Inpatient      Advance Directive Documentation    Flowsheet Row Most Recent Value  Type of Advance Directive Healthcare Power of Attorney, Living will  Pre-existing out of facility DNR order (yellow form or pink MOST form) --  "MOST" Form in Place? --       Prognosis:  Unable to determine   Thank you for allowing the Palliative Medicine Team to assist in the care of this patient.   Asencion Gowda, NP  Please contact Palliative Medicine Team phone at [phone] for questions and concerns.

## 2022-04-13 NOTE — Progress Notes (Signed)
Multiple wounds dressed with gauze after applying silverdiazine on his R/thigh and R/knee. Wounds are clean and dry. No drainage. Applied kerlix on it at 1000

## 2022-04-13 NOTE — Progress Notes (Signed)
Occupational Therapy Treatment Patient Details Name: Lucas Ramirez MRN: [id number] DOB: Nov 04, 1955 Today's Date: 04/13/2022   History of present illness Pt is a 66 year old male admitted with Medium-sized acute infarct within the right frontal lobe after presenting to the ED with confusion, lethargy, shortness of breath ongoing for more than a month and worse for the last few days; PMH significant for COPD and chronic smoker, chronic back pain on oxycodone, Bell's palsy, hypertension, hyperlipidemia, GERD, poor health maintenance, cachexia of unknown cause   OT comments  Chart reviewed,RN cleared pt for participation in OT tx session. Pt is lethargic, oriented to self and place. Increased time required for processing. Per notes, cognition appears to fluctuate, impacting functional performance. Tx session targeted improving functional activity tolerance for improved participation in ADL task completion. Supine>sit completed with MOD A, STS with MOD A, step pivot transfer to bedside char with MIN A with RW. Grooming tasks completed with supervision in sitting. Pt is making slow progress towards goals, discharge recommendation remains appropriate. OT will follow acutely.    Recommendations for follow up therapy are one component of a multi-disciplinary discharge planning process, led by the attending physician.  Recommendations may be updated based on patient status, additional functional criteria and insurance authorization.    Follow Up Recommendations  Skilled nursing-short term rehab (<3 hours/Lucas)     Assistance Recommended at Discharge Frequent or constant Supervision/Assistance  Patient can return home with the following  A lot of help with walking and/or transfers;A lot of help with bathing/dressing/bathroom   Equipment Recommendations  Other (comment) (per next venue of care)    Recommendations for Other Services      Precautions / Restrictions Precautions Precautions:  Fall Restrictions Weight Bearing Restrictions: No       Mobility Bed Mobility Overal bed mobility: Needs Assistance Bed Mobility: Supine to Sit     Supine to sit: Mod assist          Transfers Overall transfer level: Needs assistance Equipment used: Rolling walker (2 wheels) Transfers: Bed to chair/wheelchair/BSC, Sit to/from Stand Sit to Stand: Mod assist (bed not elevated)     Step pivot transfers: Min assist     General transfer comment: step by step vcs for safety/technique     Balance Overall balance assessment: Needs assistance Sitting-balance support: Feet supported Sitting balance-Leahy Scale: Fair   Postural control: Other (comment) (anterior lean) Standing balance support: Bilateral upper extremity supported, During functional activity, Reliant on assistive device for balance Standing balance-Leahy Scale: Poor                             ADL either performed or assessed with clinical judgement   ADL Overall ADL's : Needs assistance/impaired     Grooming: Wash/dry hands;Wash/dry face;Sitting;Supervision/safety               Lower Body Dressing: Maximal assistance Lower Body Dressing Details (indicate cue type and reason): socks Toilet Transfer: Rolling walker (2 wheels);Minimal assistance Toilet Transfer Details (indicate cue type and reason): step pivot to bedside chair, frequent vcs for technique                Extremity/Trunk Assessment              Vision       Perception     Praxis      Cognition Arousal/Alertness: Lethargic Behavior During Therapy: Flat affect Overall Cognitive Status: No family/caregiver present to  determine baseline cognitive functioning Area of Impairment: Orientation, Attention, Memory, Following commands, Safety/judgement, Awareness, Problem solving                 Orientation Level: Disoriented to, situation, Time Current Attention Level: Focused Memory: Decreased short-term  memory Following Commands: Follows one step commands with increased time Safety/Judgement: Decreased awareness of safety, Decreased awareness of deficits Awareness: Intellectual Problem Solving: Decreased initiation, Slow processing, Difficulty sequencing, Requires verbal cues, Requires tactile cues          Exercises      Shoulder Instructions       General Comments vital signs monitored, down to 88% on 2 L via [place] after transfer, >90% after approx 30 seconds, PLB; pt with wet cough, RN aware    Pertinent Vitals/ Pain       Pain Assessment Pain Assessment: Faces Faces Pain Scale: Hurts a little bit Pain Location: generalized Pain Descriptors / Indicators: Discomfort Pain Intervention(s): Monitored during session  Home Living                                          Prior Functioning/Environment              Frequency  Min 2X/week        Progress Toward Goals  OT Goals(current goals can now be found in the care plan section)  Progress towards OT goals: Progressing toward goals     Plan Discharge plan remains appropriate    Co-evaluation                 AM-PAC OT "6 Clicks" Daily Activity     Outcome Measure   Help from another person eating meals?: A Little Help from another person taking care of personal grooming?: A Little Help from another person toileting, which includes using toliet, bedpan, or urinal?: A Lot Help from another person bathing (including washing, rinsing, drying)?: A Lot Help from another person to put on and taking off regular upper body clothing?: A Little Help from another person to put on and taking off regular lower body clothing?: A Lot 6 Click Score: 15    End of Session Equipment Utilized During Treatment: Rolling walker (2 wheels);Oxygen  OT Visit Diagnosis: Unsteadiness on feet (R26.81);Muscle weakness (generalized) (M62.81)   Activity Tolerance Patient tolerated treatment well   Patient Left  in chair;with call bell/phone within reach;with chair alarm set   Nurse Communication Mobility status        Time: 1884-1660 OT Time Calculation (min): 14 min  Charges: OT General Charges $OT Visit: 1 Visit OT Treatments $Therapeutic Activity: 8-22 mins  Shanon Payor, OTD OTR/L  04/13/22, 4:14 PM

## 2022-04-13 NOTE — Care Management Important Message (Signed)
Important Message  Patient Details  Name: Lucas Ramirez MRN: [id number] Date of Birth: 1956-02-07   Medicare Important Message Given:  N/A - LOS <3 / Initial given by admissions     Juliann Pulse A Tammie Yanda 04/13/2022, 9:35 AM

## 2022-04-13 NOTE — Progress Notes (Signed)
Nutrition Follow-up  DOCUMENTATION CODES:   Severe malnutrition in context of chronic illness  INTERVENTION:   -Continue 30 ml Prosource Plus TID, each supplement provides 100 kcals and 15 grams protein -Increase Ensure Enlive po to QID, each supplement provides 350 kcal and 20 grams of protein.   NUTRITION DIAGNOSIS:   Severe Malnutrition related to chronic illness (COPD) as evidenced by moderate fat depletion, severe fat depletion, moderate muscle depletion, severe muscle depletion.  Ongoing  GOAL:   Patient will meet greater than or equal to 90% of their needs  Progressing   MONITOR:   PO intake, Supplement acceptance, Labs, Weight trends, Skin, I & O's  REASON FOR ASSESSMENT:   Malnutrition Screening Tool, Consult Assessment of nutrition requirement/status  ASSESSMENT:   66 y.o. male with medical history of COPD, chronic smoker, chronic back pain on oxycodone, Bell's palsy, HTN, HLD, GERD, poor health maintenance, chronic BLE swelling, cachexia of unknown cause and extensively investigated with his primary care physician's office, and stroke currently on Plavix. He presented to the ED due to confusion, lethargy, extreme weakness, and shortness of breath for >1 month that worsened in the past few days. He reported being unable to walk due to weakness. Patient continues to smoke while laying in his bed and falls asleep while smoking; wakes up with burns on his skin. He has been trialed on megace and Remeron for appetite stimulation in the past but these were ineffective. Patient reports ongoing poor appetite and that he has not eaten a full meal for ~1 month.  12/18- s/p BSE- dysphagia 3 diet with thin liquids  Reviewed I/O's: -1 L x 24 hours and -597 ml since admission  UOP: 1 L x 24 hours  Pt lying in recliner chair at time of visit, sleeping soundly. He did not respond to voice or touch. No family present to provide additional history.   Per RN, pt with minimal oral  intake, eating only 2-3 bites at meals. However, pt is drinking Ensure supplements well. Noted pt consumed 75% of strawberry Ensure on tray table.  Palliative care following for goals of care discussions. Pt considering comfort care, but would like to discuss further with wife.   Medications reviewed and include calcium carbonate, vitamin D3, megace, and remeron.   Labs reviewed: CBGS: 130-153 (inpatient orders for glycemic control are none).    NUTRITION - FOCUSED PHYSICAL EXAM:  Flowsheet Row Most Recent Value  Orbital Region Severe depletion  Upper Arm Region Severe depletion  Thoracic and Lumbar Region Moderate depletion  Buccal Region Severe depletion  Temple Region Severe depletion  Clavicle Bone Region Severe depletion  Clavicle and Acromion Bone Region Severe depletion  Scapular Bone Region Severe depletion  Dorsal Hand Severe depletion  Patellar Region Moderate depletion  Anterior Thigh Region Moderate depletion  Posterior Calf Region Moderate depletion  Edema (RD Assessment) Mild  Hair Reviewed  Eyes Reviewed  Mouth Reviewed  Skin Reviewed  Nails Reviewed       Diet Order:   Diet Order             DIET DYS 3 Room service appropriate? Yes with Assist; Fluid consistency: Thin  Diet effective now                   EDUCATION NEEDS:   Not appropriate for education at this time  Skin:  Skin Assessment: Skin Integrity Issues: Skin Integrity Issues:: Stage II Stage II: coccyx  Last BM:  04/11/22  Height:  Ht Readings from Last 1 Encounters:  04/09/22 '5\' 7"'$  (1.702 m)    Weight:   Wt Readings from Last 1 Encounters:  04/13/22 71.1 kg    Ideal Body Weight:  67.3 kg  BMI:  Body mass index is 24.55 kg/m.  Estimated Nutritional Needs:   Kcal:  2000-2200  Protein:  100-115 grams  Fluid:  > 2 L    Loistine Chance, RD, LDN, [place] Registered Dietitian II Certified Diabetes Care and Education Specialist Please refer to [facility] for RD and/or RD  on-call/weekend/after hours pager

## 2022-04-13 NOTE — Progress Notes (Signed)
PT Cancellation Note  Patient Details Name: Lucas Ramirez MRN: [id number] DOB: [birth year]   Cancelled Treatment:    Reason Eval/Treat Not Completed: Fatigue/lethargy limiting ability to participate (Chart reviewed, treatment attempted.) Pt in chair in a deep slumber, head supported by bed rail. Author assists with reposition of head to protect skin intergrity/comfort, and removes slack from [place] as it is no longer in nares. Pt sleeps throughout both. Will defer treatment to later date/time.   3:08 PM, 04/13/22 [name], PT, DPT Physical Therapist - [facility]  [phone] ([place])    [name] 04/13/2022, 3:08 PM

## 2022-04-13 NOTE — Progress Notes (Signed)
Progress Note    Lucas Ramirez  LSL:[id number] DOB: 18-Nov-1955  DOA: 04/09/2022 PCP: Birdie Sons, MD      Brief Narrative:    Medical records reviewed and are as summarized below:  Lucas Ramirez is a 66 y.o. male  with medical history significant of COPD and chronic smoker, chronic back pain on oxycodone, Bell's palsy, hypertension, hyperlipidemia, GERD, poor health maintenance, cachexia of unknown cause and extensively investigated with his primary care physician's office, history of stroke currently on Plavix brought from home with confusion, lethargy, shortness of breath ongoing for more than a month and with progressive worsening few days prior to admission.  He complained of inability to walk because of profound generalized weakness. .     Assessment/Plan:   Principal Problem:   Shortness of breath Active Problems:   HLD (hyperlipidemia)   HTN (hypertension)   Stroke (cerebrum) (HCC)   Cerebrovascular disease   Tobacco dependence   Cachexia (HCC)   Pressure injury of skin   Nutrition Problem: Inadequate oral intake Etiology: poor appetite  Signs/Symptoms: per patient/family report   Body mass index is 24.55 kg/m.   Shortness of breath, likely multifactorial: CTA negative for pulmonary embolism no acute infection but there is evidence of chronic scarring with honeycombing.  Acute stroke/right infarcts in the right frontal lobe: Continue aspirin, Plavix  C. difficile diarrhea: C. difficile toxin was negative but toxigenic C. difficile PCR and C. difficile antigen were positive.  Continue oral vancomycin for total 10 days because of recent diarrhea  Failure to thrive, cachexia, multivitamin deficiency (B6, K1, D), iron deficiency: Continue Megace and Remeron.  Vitamin B1 level was normal.  Vitamin A, C and E levels are pending.  Start ferrous sulfate.  Continue multivitamins. Follow-up with dietitian.  TOC/social worker to follow-up with  DSS.  Asymmetric wounds on the right thigh  Other comorbidities include hypertension, hyperlipidemia, tobacco use disorder    Diet Order             DIET DYS 3 Room service appropriate? Yes with Assist; Fluid consistency: Thin  Diet effective now                            Consultants: Neurologist Palliative care  Procedures: None    Medications:     stroke: early stages of recovery book   Does not apply Once   (feeding supplement) PROSource Plus  30 mL Oral TID BM   aspirin EC  81 mg Oral Daily   calcium carbonate  500 mg of elemental calcium Oral BID WC   cholecalciferol  2,000 Units Oral Daily   clopidogrel  75 mg Oral Daily   enoxaparin (LOVENOX) injection  40 mg Subcutaneous Q24H   feeding supplement  237 mL Oral TID BM   gabapentin  800 mg Oral TID   latanoprost  1-2 drop Both Eyes QHS   megestrol  400 mg Oral BID   mirtazapine  7.5 mg Oral QHS   multivitamin with minerals  1 tablet Oral Daily   nicotine  21 mg Transdermal Daily   pantoprazole  40 mg Oral Daily   silver sulfADIAZINE   Topical Daily   vancomycin  125 mg Oral QID   Continuous Infusions:   Anti-infectives (From admission, onward)    Start     Dose/Rate Route Frequency Ordered Stop   04/10/22 1530  vancomycin (VANCOCIN) capsule 125 mg  125 mg Oral 4 times daily 04/10/22 1437 04/20/22 1359   04/09/22 1730  vancomycin (VANCOREADY) IVPB 1250 mg/250 mL        1,250 mg 166.7 mL/hr over 90 Minutes Intravenous  Once 04/09/22 1715 04/09/22 1945   04/09/22 1715  ceFEPIme (MAXIPIME) 2 g in sodium chloride 0.9 % 100 mL IVPB        2 g 200 mL/hr over 30 Minutes Intravenous  Once 04/09/22 1715 04/09/22 1909              Family Communication/Anticipated D/C date and plan/Code Status   DVT prophylaxis: enoxaparin (LOVENOX) injection 40 mg Start: 04/10/22 0800     Code Status: Full Code  Family Communication: None Disposition Plan: Plan to discharge to SNF   Status  is: Inpatient Remains inpatient appropriate because: TOC working on safe disposition plan       Subjective:   Interval events noted.  He complains of generalized weakness.  No shortness of breath or chest pain  Objective:    Vitals:   04/13/22 0056 04/13/22 0454 04/13/22 0606 04/13/22 0745  BP: (!) 156/79 (!) 153/68  (!) 158/77  Pulse: 90 87  85  Resp: '18 18  16  '$ Temp: 98.5 F (36.9 C) 97.8 F (36.6 C)  98.9 F (37.2 C)  TempSrc: Oral Oral    SpO2: 100% 99%  100%  Weight:   71.1 kg   Height:       No data found.   Intake/Output Summary (Last 24 hours) at 04/13/2022 1437 Last data filed at 04/13/2022 0900 Gross per 24 hour  Intake 360 ml  Output 1000 ml  Net -640 ml   Filed Weights   04/09/22 2023 04/12/22 0336 04/13/22 0606  Weight: 50.1 kg 52.1 kg 71.1 kg    Exam:  GEN: NAD SKIN: Warm and dry.  Multiple, chronic skin lesions on lower extremities EYES: No pallor or icterus, abnormal right eye ENT: MMM CV: RRR PULM: CTA B ABD: soft, ND, NT, +BS CNS: Drowsy but arousable, oriented x 3, non focal, speech is slurred EXT: Bilateral leg edema, no tenderness    Pressure Injury 04/09/22 Coccyx Stage 2 -  Partial thickness loss of dermis presenting as a shallow open injury with a red, pink wound bed without slough. (Active)  04/09/22 2015  Location: Coccyx  Location Orientation:   Staging: Stage 2 -  Partial thickness loss of dermis presenting as a shallow open injury with a red, pink wound bed without slough.  Wound Description (Comments):   Present on Admission: Yes  Dressing Type Foam - Lift dressing to assess site every shift 04/13/22 0815     Data Reviewed:   I have personally reviewed following labs and imaging studies:  Labs: Labs show the following:   Basic Metabolic Panel: Recent Labs  Lab 04/09/22 1331 04/09/22 1800 04/13/22 0420  NA 135  --  136  K 5.0  --  5.1  CL 102  --  106  CO2 22  --  28  GLUCOSE 122*  --  147*  BUN 21  --   18  CREATININE 0.81  --  0.69  CALCIUM 7.7*  --  7.7*  MG  --  1.8 1.9  PHOS  --  2.9  --    GFR Estimated Creatinine Clearance: 84.9 mL/min (by C-G formula based on SCr of 0.69 mg/dL). Liver Function Tests: Recent Labs  Lab 04/09/22 1331  AST 45*  ALT 28  ALKPHOS 77  BILITOT 0.8  PROT 6.3*  ALBUMIN 3.0*   No results for input(s): "LIPASE", "AMYLASE" in the last 168 hours. Recent Labs  Lab 04/09/22 1528  AMMONIA 13   Coagulation profile No results for input(s): "INR", "PROTIME" in the last 168 hours.  CBC: Recent Labs  Lab 04/09/22 1331 04/13/22 0420  WBC 11.9* 9.8  NEUTROABS 10.4*  --   HGB 12.6* 8.7*  HCT 37.9* 26.8*  MCV 92.2 93.7  PLT 639* 437*   Cardiac Enzymes: No results for input(s): "CKTOTAL", "CKMB", "CKMBINDEX", "TROPONINI" in the last 168 hours. BNP (last 3 results) No results for input(s): "PROBNP" in the last 8760 hours. CBG: Recent Labs  Lab 04/12/22 2048 04/13/22 0744 04/13/22 1206  GLUCAP 144* 130* 153*   D-Dimer: No results for input(s): "DDIMER" in the last 72 hours. Hgb A1c: No results for input(s): "HGBA1C" in the last 72 hours. Lipid Profile: No results for input(s): "CHOL", "HDL", "LDLCALC", "TRIG", "CHOLHDL", "LDLDIRECT" in the last 72 hours. Thyroid function studies: No results for input(s): "TSH", "T4TOTAL", "T3FREE", "THYROIDAB" in the last 72 hours.  Invalid input(s): "FREET3" Anemia work up: No results for input(s): "VITAMINB12", "FOLATE", "FERRITIN", "TIBC", "IRON", "RETICCTPCT" in the last 72 hours. Sepsis Labs: Recent Labs  Lab 04/09/22 1331 04/09/22 1528 04/13/22 0420  PROCALCITON 49.03  --   --   WBC 11.9*  --  9.8  LATICACIDVEN 1.6 1.0  --     Microbiology Recent Results (from the past 240 hour(s))  Blood culture (routine x 2)     Status: None (Preliminary result)   Collection Time: 04/09/22  1:36 PM   Specimen: BLOOD  Result Value Ref Range Status   Specimen Description BLOOD RIGHT ANTECUBITAL  Final    Special Requests   Final    BOTTLES DRAWN AEROBIC AND ANAEROBIC Blood Culture adequate volume   Culture   Final    NO GROWTH 4 DAYS Performed at [facility], [address], [place], [place] [address]    Report Status PENDING  Incomplete  Resp panel by RT-PCR (RSV, Flu A&B, Covid) Anterior Nasal Swab     Status: None   Collection Time: 04/09/22  3:28 PM   Specimen: Anterior Nasal Swab  Result Value Ref Range Status   SARS Coronavirus 2 by RT PCR NEGATIVE NEGATIVE Final    Comment: (NOTE) SARS-CoV-2 target nucleic acids are NOT DETECTED.  The SARS-CoV-2 RNA is generally detectable in upper respiratory specimens during the acute phase of infection. The lowest concentration of SARS-CoV-2 viral copies this assay can detect is 138 copies/mL. A negative result does not preclude SARS-Cov-2 infection and should not be used as the sole basis for treatment or other patient management decisions. A negative result may occur with  improper specimen collection/handling, submission of specimen other than nasopharyngeal swab, presence of viral mutation(s) within the areas targeted by this assay, and inadequate number of viral copies(<138 copies/mL). A negative result must be combined with clinical observations, patient history, and epidemiological information. The expected result is Negative.  Fact Sheet for Patients:  EntrepreneurPulse.com.au  Fact Sheet for Healthcare Providers:  IncredibleEmployment.be  This test is no t yet approved or cleared by the [place] FDA and  has been authorized for detection and/or diagnosis of SARS-CoV-2 by FDA under an Emergency Use Authorization (EUA). This EUA will remain  in effect (meaning this test can be used) for the duration of the COVID-19 declaration under Section 564(b)(1) of the Act, 21 U.S.C.section 360bbb-3(b)(1), unless the authorization is  terminated  or revoked sooner.       Influenza A  by PCR NEGATIVE NEGATIVE Final   Influenza B by PCR NEGATIVE NEGATIVE Final    Comment: (NOTE) The Xpert Xpress SARS-CoV-2/FLU/RSV plus assay is intended as an aid in the diagnosis of influenza from Nasopharyngeal swab specimens and should not be used as a sole basis for treatment. Nasal washings and aspirates are unacceptable for Xpert Xpress SARS-CoV-2/FLU/RSV testing.  Fact Sheet for Patients: EntrepreneurPulse.com.au  Fact Sheet for Healthcare Providers: IncredibleEmployment.be  This test is not yet approved or cleared by the [place] FDA and has been authorized for detection and/or diagnosis of SARS-CoV-2 by FDA under an Emergency Use Authorization (EUA). This EUA will remain in effect (meaning this test can be used) for the duration of the COVID-19 declaration under Section 564(b)(1) of the Act, 21 U.S.C. section 360bbb-3(b)(1), unless the authorization is terminated or revoked.     Resp Syncytial Virus by PCR NEGATIVE NEGATIVE Final    Comment: (NOTE) Fact Sheet for Patients: EntrepreneurPulse.com.au  Fact Sheet for Healthcare Providers: IncredibleEmployment.be  This test is not yet approved or cleared by the [place] FDA and has been authorized for detection and/or diagnosis of SARS-CoV-2 by FDA under an Emergency Use Authorization (EUA). This EUA will remain in effect (meaning this test can be used) for the duration of the COVID-19 declaration under Section 564(b)(1) of the Act, 21 U.S.C. section 360bbb-3(b)(1), unless the authorization is terminated or revoked.  Performed at [facility], [address] [place], [place] [address]   Blood culture (routine x 2)     Status: None (Preliminary result)   Collection Time: 04/09/22  8:40 PM   Specimen: Left Antecubital; Blood  Result Value Ref Range Status   Specimen Description LEFT ANTECUBITAL  Final   Special Requests    Final    BOTTLES DRAWN AEROBIC AND ANAEROBIC Blood Culture adequate volume   Culture   Final    NO GROWTH 4 DAYS Performed at [facility], [address] [place], [place] [address]    Report Status PENDING  Incomplete  C Difficile Quick Screen w PCR reflex     Status: Abnormal   Collection Time: 04/10/22 12:02 PM   Specimen: STOOL  Result Value Ref Range Status   C Diff antigen POSITIVE (A) NEGATIVE Final   C Diff toxin NEGATIVE NEGATIVE Final   C Diff interpretation Results are indeterminate. See PCR results.  Final    Comment: Performed at [facility], [address] [place], [place] [address]  C. Diff by PCR, Reflexed     Status: Abnormal   Collection Time: 04/10/22 12:02 PM  Result Value Ref Range Status   Toxigenic C. Difficile by PCR POSITIVE (A) NEGATIVE Final    Comment: Positive for toxigenic C. difficile with little to no toxin production. Only treat if clinical presentation suggests symptomatic illness. Performed at [facility], [address] [place], [place] [address]     Procedures and diagnostic studies:  No results found.             LOS: 3 days   Devyne Hauger  [facility]   Pager on [email or web address]. If 7PM-7AM, please contact night-coverage at [email or web address]     04/13/2022, 2:37 PM

## 2022-04-13 NOTE — Progress Notes (Signed)
SLP F/U Note  Patient Details Name: Lucas Ramirez MRN: [id number] DOB: 1955/06/05   Cancelled treatment:       Reason Eval/Treat Not Completed:  (chart reviewed; NSG consulted) NSG reported bites/sips; pt reported to MD poor appetite. Appetite stimulants have not helped. Deconditioned and much decline in past 1-2 years w/ "worsening s/s". Mentation and alertness wax/wane; globally remarkably weak per notes. Awaiting Palliative Care discussion re: [place] w/ pt/Wife tomorrow. Hold on any Cog eval pending GOC discussion. Continue w/ po check and diet modification(foods) as needed next 1-2 days.     Orinda Kenner, MS, CCC-SLP Speech Language Pathologist Rehab Services; [place] [phone] (ascom) Ronson Hagins 04/13/2022, 8:29 AM

## 2022-04-13 NOTE — Progress Notes (Signed)
Patient noted to have eaten 1-2 bites of breakfast, but did drink his supplemental drink. Patient states he's "just not interested" in his meal. Patient asked what he would like for dinner to which he replied, "nothing." Patient asked if he liked yogurt and/or fruit. Patient agreeable to nurse ordering yogurt and fruit for lunch.

## 2022-04-13 NOTE — Progress Notes (Signed)
Foam dressing placed on right heel blister. Patient states he "always has pain." When asked about location, he states "my right foot." Interestingly, when pressure is applied heel, patient does not grimace or express any pain cues. When probed about this, patient states, "oh I must not have noticed." CMS checked and noted to be intact to bilateral feet and lower extremities.

## 2022-04-14 DIAGNOSIS — E43 Unspecified severe protein-calorie malnutrition: Secondary | ICD-10-CM | POA: Insufficient documentation

## 2022-04-14 DIAGNOSIS — I679 Cerebrovascular disease, unspecified: Secondary | ICD-10-CM | POA: Diagnosis not present

## 2022-04-14 DIAGNOSIS — Z7189 Other specified counseling: Secondary | ICD-10-CM | POA: Diagnosis not present

## 2022-04-14 DIAGNOSIS — R0602 Shortness of breath: Secondary | ICD-10-CM | POA: Diagnosis not present

## 2022-04-14 DIAGNOSIS — I5032 Chronic diastolic (congestive) heart failure: Secondary | ICD-10-CM | POA: Diagnosis present

## 2022-04-14 LAB — CULTURE, BLOOD (ROUTINE X 2)
Culture: NO GROWTH
Culture: NO GROWTH
Special Requests: ADEQUATE
Special Requests: ADEQUATE

## 2022-04-14 LAB — GLUCOSE, CAPILLARY
Glucose-Capillary: 93 mg/dL (ref 70–99)
Glucose-Capillary: 144 mg/dL — ABNORMAL HIGH (ref 70–99)

## 2022-04-14 MED ORDER — FUROSEMIDE 20 MG PO TABS
20.0000 mg | ORAL_TABLET | Freq: Every day | ORAL | Status: DC
  Administered 2022-04-14 – 2022-04-21 (×8): 20 mg via ORAL
  Filled 2022-04-14 (×8): qty 1

## 2022-04-14 NOTE — Progress Notes (Signed)
Speech Language Pathology Treatment:    Patient Details Name: Lucas Ramirez MRN: [id number] DOB: 12-Jan-1956 Today's Date: 04/14/2022 Time: 5681-2751 SLP Time Calculation (min) (ACUTE ONLY): 15 min  Assessment / Plan / Recommendation Clinical Impression  Pt seen for diet tolerance. Pt alert. Slow to respond. Flat affect. Seated upright in recliner.   Per chart review, most recent temp and WBC WNL. No recent chest imaging. Pt on 2L/min O2 via [place].   Pt given trials of soft solid, pureed, and thin liquids. Pt with s/sx mild oral dysphagia c/b mildl yprolonged mastication of soft solids. Oral deficits likely due to missing dentition and exacerbated by deconditioned state and mental status.    Recommend a mech soft diet with thin liquids with safe swallowing strategies/aspiration precautions as outlined below. Pt would benefit from continued RD f/u given concern for ability to meet nutritional needs PO.    Pt may also benefit from cognitive-linguistic evaluation given apparent cognitive deficits in setting of stroke.    SLP to f/u per POC for cognitive-linguistic evaluation, as indicated. Anticipate need for frequent/constant supervision/assistance at d/c as well as post-acute SLP services.    Pt and NT made aware of results, recommendations, and SLP POC. Reinforced relationship between breathing/swallowing with pt. Suspect need for reinforcement of content.     HPI HPI: Per H&P "Lucas Ramirez is a 66 y.o. male with medical history significant of COPD and chronic smoker, chronic back pain on oxycodone, Bell's palsy, hypertension, hyperlipidemia, GERD, poor health maintenance, cachexia of unknown cause and extensively investigated with his primary care physician's office, history of stroke currently on Plavix brought from home with confusion, lethargy, shortness of breath ongoing for more than a month and worse for the last few days.  Patient tells me he does have shortness of breath but that has  been chronic.  Patient tells me that he cannot walk these days because of extreme weakness.  He continues to smoke laying in the bed, falls asleep with smoking and burns his skin.  Denies any alcohol or drug use.  His symptoms are gradually worsening over the last 2 years but recently has gotten very bad.  Reviewed his outpatient records, he had been investigated and recently started on Megace and Remeron with no effects.  Appetite is poor and has really not eaten good portion of meal for almost a month.  Anorexia, progressive debility, unable to move around, chronic swelling of both legs.  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 No  hemorrhage or mass effect."      SLP Plan  Continue with current plan of care (pending further establishment of [place]; cognitive-linguistic evaluation pending)      Recommendations for follow up therapy are one component of a multi-disciplinary discharge planning process, led by the attending physician.  Recommendations may be updated based on patient status, additional functional criteria and insurance authorization.    Recommendations  Diet recommendations: Dysphagia 3 (mechanical soft);Thin liquid Medication Administration: Whole meds with liquid (as tolerated) Supervision: Patient able to self feed (set up) Compensations: Minimize environmental distractions;Slow rate;Small sips/bites (rest breaks PRN) Postural Changes and/or Swallow Maneuvers: Out of bed for meals;Seated upright 90 degrees;Upright 30-60 min after meal                General recommendations:  (RD f/u; Palliative Care f/u) Oral Care Recommendations: Oral care QID;Patient independent with oral care Follow Up Recommendations: Skilled nursing-short term rehab (<3 hours/day) Assistance recommended  at discharge: Frequent or constant Supervision/Assistance SLP Visit Diagnosis: Dysphagia, oral phase (R13.11);Cognitive communication deficit  (R41.841) Plan: Continue with current plan of care (pending further establishment of [place]; cognitive-linguistic evaluation pending)          Cherrie Gauze, M.S., [facility] [phone] ([place])   Quintella Baton  04/14/2022, 1:04 PM

## 2022-04-14 NOTE — Progress Notes (Signed)
Daily Progress Note   Patient Name: Lucas Ramirez       Date: 04/14/2022 DOB: 08-03-1955  Age: 66 y.o. MRN#: [id number] Attending Physician: Jennye Boroughs, MD Primary Care Physician: Birdie Sons, MD Admit Date: 04/09/2022  Reason for Consultation/Follow-up: Establishing goals of care  Subjective: Notes reviewed. In to see patient; is currently sitting in the bedside chair. Patient appears sleepy and dozes intermittently while talking, but awakened to voice.    He states that he and his wife have talked, and he would like to go to rehab.  With further conversation, he states " I don't give a *+** about living longer, I just want to live better."  Discussed the goal of rehab, and life-prolonging care.  Discussed the goal of comfort focused care.  He then states " I guess I want to go to rehab and try to live as long as I can then".  Discussed the difference in the statements, and he states he is feeling confused.  Discussed that I could call his wife to discuss plans moving forward and their wishes and he is amenable.   Called to speak to his wife.  She states she spoke with him and that he is indeed confused.  She states that first he mentioned pursuing hospice and then he mentioned pursuing rehab.  I discussed my conversation with him.  She states that they met with an attorney years ago and he stated then he would not want CPR or to be placed on ventilator support.  She states she would like time to see how he does and consider options.    Discussed that if patient would like to pursue comfort focused care, he would be a candidate for hospice at home. Questions answered regarding hospice at home, and questions answered as able regarding rehab.  I completed a MOST form today with wife  electronically through [facility], which is immediately uploaded under ACP tab in Epic. The patient's wife outlined their wishes for the following treatment decisions:  Cardiopulmonary Resuscitation: Do Not Attempt Resuscitation (DNR/No CPR)  Medical Interventions: Limited Additional Interventions: Use medical treatment, IV fluids and cardiac monitoring as indicated, DO NOT USE intubation or mechanical ventilation. May consider use of less invasive airway support such as BiPAP or CPAP. Also provide comfort measures. Transfer to the hospital if  indicated. Avoid intensive care.   Antibiotics: Antibiotics if indicated  IV Fluids: IV fluids if indicated  Feeding Tube: Feeding tube long-term if indicated      Length of Stay: 4  Current Medications: Scheduled Meds:    stroke: early stages of recovery book   Does not apply Once   (feeding supplement) PROSource Plus  30 mL Oral TID BM   aspirin EC  81 mg Oral Daily   calcium carbonate  500 mg of elemental calcium Oral BID WC   cholecalciferol  2,000 Units Oral Daily   clopidogrel  75 mg Oral Daily   enoxaparin (LOVENOX) injection  40 mg Subcutaneous Q24H   feeding supplement  237 mL Oral QID   ferrous sulfate  325 mg Oral QODAY   gabapentin  800 mg Oral TID   latanoprost  1-2 drop Both Eyes QHS   megestrol  400 mg Oral BID   mirtazapine  7.5 mg Oral QHS   multivitamin with minerals  1 tablet Oral Daily   nicotine  21 mg Transdermal Daily   pantoprazole  40 mg Oral Daily   silver sulfADIAZINE   Topical Daily   vancomycin  125 mg Oral QID    Continuous Infusions:   PRN Meds: acetaminophen **OR** acetaminophen, albuterol, guaiFENesin, ondansetron **OR** ondansetron (ZOFRAN) IV, oxyCODONE, senna-docusate, traZODone  Physical Exam Pulmonary:     Effort: Pulmonary effort is normal.  Neurological:     Mental Status: He is alert.             Vital Signs: BP (!) 175/78 (BP Location: Left Arm)   Pulse 81   Temp 98.1 F (36.7 C)   Resp 17    Ht _0  (1.702 m)   Wt 50.5 kg   SpO2 96%   BMI 17.45 kg/m  SpO2: SpO2: 96 % O2 Device: O2 Device: Room Air O2 Flow Rate: O2 Flow Rate (L/min): 2 L/min  Intake/output summary:  Intake/Output Summary (Last 24 hours) at 04/14/2022 1304 Last data filed at 04/14/2022 8127 Gross per 24 hour  Intake 450 ml  Output 1275 ml  Net -825 ml   LBM: Last BM Date : 04/12/22 Baseline Weight: Weight: 54.4 kg Most recent weight: Weight: 50.5 kg   Patient Active Problem List   Diagnosis Date Noted   Protein-calorie malnutrition, severe 04/14/2022   Pressure injury of skin 04/10/2022   Shortness of breath 04/09/2022   Cachexia ([place]) 04/09/2022   Sepsis due to pneumonia ([place]) 08/14/2021   Hyponatremia 08/14/2021   Hypertensive urgency 08/14/2021   History of CVA (cerebrovascular accident) 08/14/2021   Dyslipidemia 08/14/2021   Tobacco dependence 06/01/2021   Hypertension, essential 06/01/2021   Carotid stenosis 09/29/2020   Cerebrovascular disease 08/31/2020   History of CVA with residual deficit 08/31/2020   Edema 08/31/2020   Stroke (cerebrum) ([place]) 08/21/2020   Visual loss 08/19/2020   History of adenomatous polyp of colon 07/05/2019   Lymphadenopathy 03/31/2019   Vitamin B6 deficiency 11/28/2018   CAP (community acquired pneumonia) 06/28/2015   COPD exacerbation ([place]) 06/28/2015   HTN (hypertension) 06/28/2015   Back pain, chronic 12/24/2014   Duodenitis 12/24/2014   Esophagitis 12/24/2014   H/O surgical procedure 12/24/2014   HLD (hyperlipidemia) 12/24/2014   Headache, migraine 12/24/2014   Neuropathy 12/24/2014   Borderline diabetes 12/24/2014   Scoliosis 12/24/2014   Compulsive tobacco user syndrome 12/24/2014   Vitamin D deficiency 12/24/2014   Bell's palsy 12/24/2014   Polyneuropathy 12/24/2014    Palliative Care Assessment &  Plan    Recommendations/Plan: Patient is confused stating that he does not care about living longer, only cares about living better,  and then states he wants to do what he can to live as long as he can.  He tells me he knows he is confused.  He is amenable to me talking to his wife. Wife states they met with a lawyer year years ago to discuss advance directives, and he would not want CPR, resuscitation, or to be placed on the ventilator. Wife would like to see how he does and consider their options.  Questions have been answered as I was able regarding hospice at home as well as skilled nursing. I will follow-up with patient and family on Monday upon my return.  If in the meantime the patient and wife choose hospice at home, please have TOC reach out to the hospice company of their choosing.  Code Status:    Code Status Orders  (From admission, onward)           Start     Ordered   04/09/22 1741  Full code  Continuous       Question:  By:  Answer:  Consent: discussion documented in EHR   04/09/22 1741           Code Status History     Date Active Date Inactive Code Status Order ID Comments User Context   08/14/2021 0409 08/17/2021 1715 Full Code [id number]  Sidney Ace Arvella Merles, MD Inpatient   08/19/2020 1238 08/21/2020 1451 DNR [id number]  Kayleen Memos, DO ED   06/28/2015 1231 07/02/2015 1416 Full Code [id number]  Idelle Crouch, MD Inpatient      Advance Directive Documentation    Flowsheet Row Most Recent Value  Type of Advance Directive Healthcare Power of Attorney, Living will  Pre-existing out of facility DNR order (yellow form or pink MOST form) --  "MOST" Form in Place? --       Thank you for allowing the Palliative Medicine Team to assist in the care of this patient.    Asencion Gowda, NP  Please contact Palliative Medicine Team phone at [phone] for questions and concerns.

## 2022-04-14 NOTE — Progress Notes (Addendum)
Progress Note    [name]  CWC:[id number] DOB: [birth year]  DOA: 04/09/2022 PCP: Birdie Sons, MD      Brief Narrative:    Medical records reviewed and are as summarized below:  Lucas Ramirez is a 66 y.o. male  with medical history significant of COPD and chronic smoker, chronic back pain on oxycodone, Bell's palsy, hypertension, hyperlipidemia, GERD, poor health maintenance, cachexia of unknown cause and extensively investigated with his primary care physician's office, history of stroke currently on Plavix brought from home with confusion, lethargy, shortness of breath ongoing for more than a month and with progressive worsening few days prior to admission.  He complained of inability to walk because of profound generalized weakness. .     Assessment/Plan:   Principal Problem:   Shortness of breath Active Problems:   HLD (hyperlipidemia)   HTN (hypertension)   Stroke (cerebrum) (HCC)   Cerebrovascular disease   Tobacco dependence   Cachexia (HCC)   Pressure injury of skin   Protein-calorie malnutrition, severe   Chronic diastolic CHF (congestive heart failure) (HCC)   Nutrition Problem: Severe Malnutrition Etiology: chronic illness (COPD)  Signs/Symptoms: moderate fat depletion, severe fat depletion, moderate muscle depletion, severe muscle depletion   Body mass index is 17.45 kg/m.   Shortness of breath, likely multifactorial: Improved.  He is tolerating room air.  CTA negative for pulmonary embolism no acute infection but there is evidence of chronic scarring with honeycombing.  Acute stroke/right infarcts in the right frontal lobe: Continue aspirin, Plavix  C. difficile diarrhea: C. difficile toxin was negative but toxigenic C. difficile PCR and C. difficile antigen were positive.  Continue oral vancomycin for total 10 days because of recent diarrhea  Hypertension, chronic diastolic CHF: BP is elevated.  Restart home dose of Lasix.  Changed to 20  mg daily instead of as needed for swelling 2D echo from 04/10/2022 showed EF estimated at 60 to 76%, grade 1 diastolic dysfunction  Failure to thrive, cachexia, multivitamin deficiency (B6, K1, D, E), iron deficiency: Continue Megace and Remeron.  Vitamin A, B1, B12, C levels were normal.  Continue ferrous sulfate and multivitamins  Follow-up with dietitian.  TOC/social worker to follow-up with DSS.  Asymmetric wounds on the right thigh: Continue local wound care  Other comorbidities include hypertension, hyperlipidemia, tobacco use disorder    Diet Order             DIET DYS 3 Room service appropriate? Yes with Assist; Fluid consistency: Thin  Diet effective now                            Consultants: Neurologist Palliative care  Procedures: None    Medications:     stroke: early stages of recovery book   Does not apply Once   (feeding supplement) PROSource Plus  30 mL Oral TID BM   aspirin EC  81 mg Oral Daily   calcium carbonate  500 mg of elemental calcium Oral BID WC   cholecalciferol  2,000 Units Oral Daily   clopidogrel  75 mg Oral Daily   enoxaparin (LOVENOX) injection  40 mg Subcutaneous Q24H   feeding supplement  237 mL Oral QID   ferrous sulfate  325 mg Oral QODAY   furosemide  20 mg Oral Daily   gabapentin  800 mg Oral TID   latanoprost  1-2 drop Both Eyes QHS   megestrol  400 mg Oral  BID   mirtazapine  7.5 mg Oral QHS   multivitamin with minerals  1 tablet Oral Daily   nicotine  21 mg Transdermal Daily   pantoprazole  40 mg Oral Daily   silver sulfADIAZINE   Topical Daily   vancomycin  125 mg Oral QID   Continuous Infusions:   Anti-infectives (From admission, onward)    Start     Dose/Rate Route Frequency Ordered Stop   04/10/22 1530  vancomycin (VANCOCIN) capsule 125 mg        125 mg Oral 4 times daily 04/10/22 1437 04/20/22 1359   04/09/22 1730  vancomycin (VANCOREADY) IVPB 1250 mg/250 mL        1,250 mg 166.7 mL/hr over 90  Minutes Intravenous  Once 04/09/22 1715 04/09/22 1945   04/09/22 1715  ceFEPIme (MAXIPIME) 2 g in sodium chloride 0.9 % 100 mL IVPB        2 g 200 mL/hr over 30 Minutes Intravenous  Once 04/09/22 1715 04/09/22 1909              Family Communication/Anticipated D/C date and plan/Code Status   DVT prophylaxis: enoxaparin (LOVENOX) injection 40 mg Start: 04/10/22 0800     Code Status: DNR  Family Communication: None Disposition Plan: Plan to discharge to SNF   Status is: Inpatient Remains inpatient appropriate because: TOC working on safe disposition plan       Subjective:   Interval events noted.  He has no complaints today.  He feels better.  He was sitting up in the chair.  Objective:    Vitals:   04/13/22 2023 04/14/22 0459 04/14/22 0500 04/14/22 0818  BP: (!) 164/86 (!) 162/78  (!) 175/78  Pulse: 93 79  81  Resp: '18 18  17  '$ Temp: 98.9 F (37.2 C) 98 F (36.7 C)  98.1 F (36.7 C)  TempSrc: Oral Oral    SpO2: 100% 97%  96%  Weight:   50.5 kg   Height:       No data found.   Intake/Output Summary (Last 24 hours) at 04/14/2022 1426 Last data filed at 04/14/2022 3335 Gross per 24 hour  Intake 450 ml  Output 1275 ml  Net -825 ml   Filed Weights   04/12/22 0336 04/13/22 0606 04/14/22 0500  Weight: 52.1 kg 71.1 kg 50.5 kg    Exam:   GEN: NAD SKIN: Multiple chronic wounds on the right thigh EYES: Anicteric, abnormal right eye ENT: MMM CV: RRR PULM: CTA B ABD: soft, ND, NT, +BS CNS: AAO x 3, non focal EXT: Bilateral leg edema, no tenderness   Pressure Injury 04/09/22 Coccyx Stage 2 -  Partial thickness loss of dermis presenting as a shallow open injury with a red, pink wound bed without slough. (Active)  04/09/22 2015  Location: Coccyx  Location Orientation:   Staging: Stage 2 -  Partial thickness loss of dermis presenting as a shallow open injury with a red, pink wound bed without slough.  Wound Description (Comments):   Present on  Admission: Yes  Dressing Type Foam - Lift dressing to assess site every shift 04/13/22 0815     Data Reviewed:   I have personally reviewed following labs and imaging studies:  Labs: Labs show the following:   Basic Metabolic Panel: Recent Labs  Lab 04/09/22 1331 04/09/22 1800 04/13/22 0420  NA 135  --  136  K 5.0  --  5.1  CL 102  --  106  CO2 22  --  28  GLUCOSE 122*  --  147*  BUN 21  --  18  CREATININE 0.81  --  0.69  CALCIUM 7.7*  --  7.7*  MG  --  1.8 1.9  PHOS  --  2.9  --    GFR Estimated Creatinine Clearance: 64.9 mL/min (by C-G formula based on SCr of 0.69 mg/dL). Liver Function Tests: Recent Labs  Lab 04/09/22 1331  AST 45*  ALT 28  ALKPHOS 77  BILITOT 0.8  PROT 6.3*  ALBUMIN 3.0*   No results for input(s): "LIPASE", "AMYLASE" in the last 168 hours. Recent Labs  Lab 04/09/22 1528  AMMONIA 13   Coagulation profile No results for input(s): "INR", "PROTIME" in the last 168 hours.  CBC: Recent Labs  Lab 04/09/22 1331 04/13/22 0420  WBC 11.9* 9.8  NEUTROABS 10.4*  --   HGB 12.6* 8.7*  HCT 37.9* 26.8*  MCV 92.2 93.7  PLT 639* 437*   Cardiac Enzymes: No results for input(s): "CKTOTAL", "CKMB", "CKMBINDEX", "TROPONINI" in the last 168 hours. BNP (last 3 results) No results for input(s): "PROBNP" in the last 8760 hours. CBG: Recent Labs  Lab 04/13/22 0744 04/13/22 1206 04/13/22 2024 04/14/22 0819 04/14/22 1235  GLUCAP 130* 153* 170* 93 144*   D-Dimer: No results for input(s): "DDIMER" in the last 72 hours. Hgb A1c: No results for input(s): "HGBA1C" in the last 72 hours. Lipid Profile: No results for input(s): "CHOL", "HDL", "LDLCALC", "TRIG", "CHOLHDL", "LDLDIRECT" in the last 72 hours. Thyroid function studies: No results for input(s): "TSH", "T4TOTAL", "T3FREE", "THYROIDAB" in the last 72 hours.  Invalid input(s): "FREET3" Anemia work up: No results for input(s): "VITAMINB12", "FOLATE", "FERRITIN", "TIBC", "IRON",  "RETICCTPCT" in the last 72 hours. Sepsis Labs: Recent Labs  Lab 04/09/22 1331 04/09/22 1528 04/13/22 0420  PROCALCITON 49.03  --   --   WBC 11.9*  --  9.8  LATICACIDVEN 1.6 1.0  --     Microbiology Recent Results (from the past 240 hour(s))  Blood culture (routine x 2)     Status: None   Collection Time: 04/09/22  1:36 PM   Specimen: BLOOD  Result Value Ref Range Status   Specimen Description BLOOD RIGHT ANTECUBITAL  Final   Special Requests   Final    BOTTLES DRAWN AEROBIC AND ANAEROBIC Blood Culture adequate volume   Culture   Final    NO GROWTH 5 DAYS Performed at [facility], [address], [place], [place] [address]    Report Status 04/14/2022 FINAL  Final  Resp panel by RT-PCR (RSV, Flu A&B, Covid) Anterior Nasal Swab     Status: None   Collection Time: 04/09/22  3:28 PM   Specimen: Anterior Nasal Swab  Result Value Ref Range Status   SARS Coronavirus 2 by RT PCR NEGATIVE NEGATIVE Final    Comment: (NOTE) SARS-CoV-2 target nucleic acids are NOT DETECTED.  The SARS-CoV-2 RNA is generally detectable in upper respiratory specimens during the acute phase of infection. The lowest concentration of SARS-CoV-2 viral copies this assay can detect is 138 copies/mL. A negative result does not preclude SARS-Cov-2 infection and should not be used as the sole basis for treatment or other patient management decisions. A negative result may occur with  improper specimen collection/handling, submission of specimen other than nasopharyngeal swab, presence of viral mutation(s) within the areas targeted by this assay, and inadequate number of viral copies(<138 copies/mL). A negative result must be combined with clinical observations, patient history, and epidemiological information. The  expected result is Negative.  Fact Sheet for Patients:  EntrepreneurPulse.com.au  Fact Sheet for Healthcare Providers:   IncredibleEmployment.be  This test is no t yet approved or cleared by the [place] FDA and  has been authorized for detection and/or diagnosis of SARS-CoV-2 by FDA under an Emergency Use Authorization (EUA). This EUA will remain  in effect (meaning this test can be used) for the duration of the COVID-19 declaration under Section 564(b)(1) of the Act, 21 U.S.C.section 360bbb-3(b)(1), unless the authorization is terminated  or revoked sooner.       Influenza A by PCR NEGATIVE NEGATIVE Final   Influenza B by PCR NEGATIVE NEGATIVE Final    Comment: (NOTE) The Xpert Xpress SARS-CoV-2/FLU/RSV plus assay is intended as an aid in the diagnosis of influenza from Nasopharyngeal swab specimens and should not be used as a sole basis for treatment. Nasal washings and aspirates are unacceptable for Xpert Xpress SARS-CoV-2/FLU/RSV testing.  Fact Sheet for Patients: EntrepreneurPulse.com.au  Fact Sheet for Healthcare Providers: IncredibleEmployment.be  This test is not yet approved or cleared by the [place] FDA and has been authorized for detection and/or diagnosis of SARS-CoV-2 by FDA under an Emergency Use Authorization (EUA). This EUA will remain in effect (meaning this test can be used) for the duration of the COVID-19 declaration under Section 564(b)(1) of the Act, 21 U.S.C. section 360bbb-3(b)(1), unless the authorization is terminated or revoked.     Resp Syncytial Virus by PCR NEGATIVE NEGATIVE Final    Comment: (NOTE) Fact Sheet for Patients: EntrepreneurPulse.com.au  Fact Sheet for Healthcare Providers: IncredibleEmployment.be  This test is not yet approved or cleared by the [place] FDA and has been authorized for detection and/or diagnosis of SARS-CoV-2 by FDA under an Emergency Use Authorization (EUA). This EUA will remain in effect (meaning this test can be used) for  the duration of the COVID-19 declaration under Section 564(b)(1) of the Act, 21 U.S.C. section 360bbb-3(b)(1), unless the authorization is terminated or revoked.  Performed at [facility], [address] [place], [place] [address]   Blood culture (routine x 2)     Status: None   Collection Time: 04/09/22  8:40 PM   Specimen: Left Antecubital; Blood  Result Value Ref Range Status   Specimen Description LEFT ANTECUBITAL  Final   Special Requests   Final    BOTTLES DRAWN AEROBIC AND ANAEROBIC Blood Culture adequate volume   Culture   Final    NO GROWTH 5 DAYS Performed at [facility], [address] [place], [place] [address]    Report Status 04/14/2022 FINAL  Final  C Difficile Quick Screen w PCR reflex     Status: Abnormal   Collection Time: 04/10/22 12:02 PM   Specimen: STOOL  Result Value Ref Range Status   C Diff antigen POSITIVE (A) NEGATIVE Final   C Diff toxin NEGATIVE NEGATIVE Final   C Diff interpretation Results are indeterminate. See PCR results.  Final    Comment: Performed at [facility], [address] [place], [place] [address]  C. Diff by PCR, Reflexed     Status: Abnormal   Collection Time: 04/10/22 12:02 PM  Result Value Ref Range Status   Toxigenic C. Difficile by PCR POSITIVE (A) NEGATIVE Final    Comment: Positive for toxigenic C. difficile with little to no toxin production. Only treat if clinical presentation suggests symptomatic illness. Performed at [facility], [address] [place], [place] [address]     Procedures  and diagnostic studies:  No results found.             LOS: 4 days   Sahan Pen  [facility]   Pager on [email or web address]. If 7PM-7AM, please contact night-coverage at [email or web address]     04/14/2022, 2:26 PM

## 2022-04-14 NOTE — Progress Notes (Signed)
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 He is awake but presents with flat affect. Was a good historian of past medical history when confirmed with spouse post session. Pt has had decline in functional abilities over past few weeks. Discussed with pt and spouse post acute hospitalization plans. Both agree to rehab at [place]. " I just don't want to go to [facility]." Pt was on 2 L o2 throughout session with sao2 dropping to 90% with limited activity. He tolerated standing 2 x from recliner > RW but get SOB quickly. Mod assist to safely stand. Only tolerated standing ~ 15 sec. Prior to needing prolonged seated rest. Pt presents with global weakness and is far from his baseline abilities. Recommend DC to SNF if POC remains to maximize independence and safety with all ADLs. Acute PT will continue to follow and progress as able per current POC.     Recommendations for follow up therapy are one component of a multi-disciplinary discharge planning process, led by the attending physician.  Recommendations may be updated based on patient status, additional functional criteria and insurance authorization.  Follow Up Recommendations  Skilled nursing-short term rehab (<3 hours/day)     Assistance Recommended at Discharge Frequent or constant Supervision/Assistance  Patient can return home with the following A lot of help with walking  and/or transfers;A lot of help with bathing/dressing/bathroom;Assistance with cooking/housework;Assistance with feeding;Direct supervision/assist for medications management;Direct supervision/assist for financial management;Assist for transportation;Help with stairs or ramp for entrance   Equipment Recommendations  Other (comment) (defer to next level of care)       Precautions / Restrictions Precautions Precautions: Fall Precaution Comments: Rt heel wound/ poor overall skin integrity Restrictions Weight Bearing Restrictions: No     Mobility  Bed Mobility  General bed mobility comments: In recliner pre/post session    Transfers Overall transfer level: Needs assistance Equipment used: Rolling walker (2 wheels) Transfers: Sit to/from Stand Sit to Stand: Mod assist    General transfer comment: mod assist of one to stand 2 x from recliner surface. Vcs + mod assist both times to achieve standing    Ambulation/Gait    General Gait Details: pt unable. per pt/pt's spouse, has been unable to ambulate for several weeks. Has been using BSC at home.   Balance Overall balance assessment: Needs assistance Sitting-balance support: Feet supported       Standing balance support: Bilateral upper extremity supported, During functional activity, Reliant on assistive device for balance Standing balance-Leahy Scale: Poor Standing balance comment: very poor/limited standing tolerance       Cognition Arousal/Alertness: Awake/alert Behavior During Therapy: Flat affect Overall Cognitive Status: No family/caregiver present to determine baseline cognitive functioning Area of Impairment: Orientation, Attention, Memory, Following commands, Safety/judgement, Awareness, Problem solving    Orientation Level: Disoriented to, Time    General Comments: Pt was cooperative but presents with flat affect. Needs some encouragement but once agreeable, did fully participate. He did endorse  poor overall  mobility prior to admission.           General Comments General comments (skin integrity, edema, etc.): SOB noted with only styanding ~ 15 sec. sao2 >90% on 2 L. prolonged recovery to slow SOB/WOB. Pt tolerated seated marching and LAQ 2 x 10 BLEs. overall session greatly limited by fatigue      Pertinent Vitals/Pain Pain Assessment Pain Assessment: No/denies pain Pain Score: 0-No pain     PT Goals (current goals can now be found in the care plan section) Acute Rehab PT Goals Patient Stated Goal: none stated Progress towards PT goals: Progressing toward goals    Frequency    Min 2X/week      PT Plan Current plan remains appropriate       AM-PAC PT "6 Clicks" Mobility   Outcome Measure  Help needed turning from your back to your side while in a flat bed without using bedrails?: A Lot Help needed moving from lying on your back to sitting on the side of a flat bed without using bedrails?: A Lot Help needed moving to and from a bed to a chair (including a wheelchair)?: A Lot Help needed standing up from a chair using your arms (e.g., wheelchair or bedside chair)?: A Lot Help needed to walk in hospital room?: Total   6 Click Score: 9    End of Session Equipment Utilized During Treatment: Oxygen (2L o2) Activity Tolerance: Patient limited by fatigue Patient left: in chair;with call bell/phone within reach;with chair alarm set Nurse Communication: Mobility status PT Visit Diagnosis: Unsteadiness on feet (R26.81);Other abnormalities of gait and mobility (R26.89);Muscle weakness (generalized) (M62.81)     Time: 4580-9983 PT Time Calculation (min) (ACUTE ONLY): 15 min  Charges:  $Therapeutic Activity: 8-22 mins                     Julaine Fusi PTA 04/14/22, 9:16 AM

## 2022-04-15 DIAGNOSIS — I679 Cerebrovascular disease, unspecified: Secondary | ICD-10-CM | POA: Diagnosis not present

## 2022-04-15 DIAGNOSIS — E43 Unspecified severe protein-calorie malnutrition: Secondary | ICD-10-CM | POA: Diagnosis not present

## 2022-04-15 DIAGNOSIS — I5032 Chronic diastolic (congestive) heart failure: Secondary | ICD-10-CM

## 2022-04-15 LAB — GLUCOSE, CAPILLARY: Glucose-Capillary: 100 mg/dL — ABNORMAL HIGH (ref 70–99)

## 2022-04-15 MED ORDER — LOSARTAN POTASSIUM 25 MG PO TABS
25.0000 mg | ORAL_TABLET | Freq: Every day | ORAL | Status: DC
  Administered 2022-04-15 – 2022-04-21 (×7): 25 mg via ORAL
  Filled 2022-04-15 (×7): qty 1

## 2022-04-15 NOTE — Progress Notes (Signed)
PT Cancellation Note  Patient Details Name: Lucas Ramirez MRN: [id number] DOB: April 20, 1956   Cancelled Treatment:    Reason Eval/Treat Not Completed: Other (comment) [phone]: Pt eating at arrival. Assisted with repositioning to upright position (dependent) in order to better participate in feeding and swallowing. Will check back as time allows.    Antoine Fiallos A Mrytle Bento 04/15/2022, 12:17 PM

## 2022-04-15 NOTE — Progress Notes (Signed)
Progress Note    Lucas Ramirez  JKD:[id number] DOB: 20-Jul-1955  DOA: 04/09/2022 PCP: Birdie Sons, MD      Brief Narrative:    Medical records reviewed and are as summarized below:  Lucas Ramirez is a 66 y.o. male  with medical history significant of COPD and chronic smoker, chronic back pain on oxycodone, Bell's palsy, hypertension, hyperlipidemia, GERD, poor health maintenance, cachexia of unknown cause and extensively investigated with his primary care physician's office, history of stroke currently on Plavix brought from home with confusion, lethargy, shortness of breath ongoing for more than a month and with progressive worsening few days prior to admission.  He complained of inability to walk because of profound generalized weakness. .     Assessment/Plan:   Principal Problem:   Shortness of breath Active Problems:   HLD (hyperlipidemia)   HTN (hypertension)   Stroke (cerebrum) (HCC)   Cerebrovascular disease   Tobacco dependence   Cachexia (HCC)   Pressure injury of skin   Protein-calorie malnutrition, severe   Chronic diastolic CHF (congestive heart failure) (HCC)   Nutrition Problem: Severe Malnutrition Etiology: chronic illness (COPD)  Signs/Symptoms: moderate fat depletion, severe fat depletion, moderate muscle depletion, severe muscle depletion   Body mass index is 16.9 kg/m.   Shortness of breath, likely multifactorial: Improved.  He is tolerating room air.  CTA negative for pulmonary embolism no acute infection but there is evidence of chronic scarring with honeycombing.  Acute stroke/right infarcts in the right frontal lobe: Continue aspirin and Plavix.  LDL was 50  C. difficile diarrhea: C. difficile toxin was negative but toxigenic C. difficile PCR and C. difficile antigen were positive.  Continue oral vancomycin through 04/20/2022 to complete a 10-day course  Hypertension, chronic diastolic CHF: BP still uncontrolled.  Start losartan.   Continue Lasix. 2D echo from 04/10/2022 showed EF estimated at 60 to 09%, grade 1 diastolic dysfunction  Failure to thrive, cachexia, multivitamin deficiency (B6, K1, D, E), iron deficiency: Continue Megace and Remeron.  Vitamin A, B1, B12, C levels were normal.  Continue ferrous sulfate and multivitamins  Follow-up with dietitian.  TOC/social worker to follow-up with DSS.  Asymmetric wounds on the right thigh: Continue local wound care  Other comorbidities include hypertension, hyperlipidemia, tobacco use disorder    Diet Order             DIET DYS 3 Room service appropriate? Yes with Assist; Fluid consistency: Thin  Diet effective now                            Consultants: Neurologist Palliative care  Procedures: None    Medications:     stroke: early stages of recovery book   Does not apply Once   (feeding supplement) PROSource Plus  30 mL Oral TID BM   aspirin EC  81 mg Oral Daily   calcium carbonate  500 mg of elemental calcium Oral BID WC   cholecalciferol  2,000 Units Oral Daily   clopidogrel  75 mg Oral Daily   enoxaparin (LOVENOX) injection  40 mg Subcutaneous Q24H   feeding supplement  237 mL Oral QID   ferrous sulfate  325 mg Oral QODAY   furosemide  20 mg Oral Daily   gabapentin  800 mg Oral TID   latanoprost  1-2 drop Both Eyes QHS   losartan  25 mg Oral Daily   megestrol  400 mg Oral  BID   mirtazapine  7.5 mg Oral QHS   multivitamin with minerals  1 tablet Oral Daily   nicotine  21 mg Transdermal Daily   pantoprazole  40 mg Oral Daily   silver sulfADIAZINE   Topical Daily   vancomycin  125 mg Oral QID   Continuous Infusions:   Anti-infectives (From admission, onward)    Start     Dose/Rate Route Frequency Ordered Stop   04/10/22 1530  vancomycin (VANCOCIN) capsule 125 mg        125 mg Oral 4 times daily 04/10/22 1437 04/20/22 1359   04/09/22 1730  vancomycin (VANCOREADY) IVPB 1250 mg/250 mL        1,250 mg 166.7 mL/hr over 90  Minutes Intravenous  Once 04/09/22 1715 04/09/22 1945   04/09/22 1715  ceFEPIme (MAXIPIME) 2 g in sodium chloride 0.9 % 100 mL IVPB        2 g 200 mL/hr over 30 Minutes Intravenous  Once 04/09/22 1715 04/09/22 1909              Family Communication/Anticipated D/C date and plan/Code Status   DVT prophylaxis: enoxaparin (LOVENOX) injection 40 mg Start: 04/10/22 0800     Code Status: DNR  Family Communication: None Disposition Plan: Plan to discharge to SNF   Status is: Inpatient Remains inpatient appropriate because: TOC working on safe disposition plan       Subjective:   Interval events noted.  He's still having diarrhea.  He had 2 watery stool this this morning.  Jerene Pitch, RN, and nurse assistant were at the bedside   Objective:    Vitals:   04/15/22 0255 04/15/22 0603 04/15/22 0758 04/15/22 1241  BP:  (!) 170/86 (!) 159/95   Pulse:  87 91   Resp:  20 20   Temp:  97.6 F (36.4 C) 97.8 F (36.6 C)   TempSrc:   Oral   SpO2:  99% 99% 95%  Weight: 48.9 kg     Height:       No data found.   Intake/Output Summary (Last 24 hours) at 04/15/2022 1344 Last data filed at 04/15/2022 0715 Gross per 24 hour  Intake --  Output 2000 ml  Net -2000 ml   Filed Weights   04/13/22 0606 04/14/22 0500 04/15/22 0255  Weight: 71.1 kg 50.5 kg 48.9 kg    Exam:  GEN: NAD SKIN: Wounds on the right thigh EYES: Anicteric ENT: MMM CV: RRR PULM: CTA B ABD: soft, ND, NT, +BS CNS: AAO x 3, non focal EXT: Bilateral leg edema, tenderness    Pressure Injury 04/09/22 Coccyx Stage 2 -  Partial thickness loss of dermis presenting as a shallow open injury with a red, pink wound bed without slough. (Active)  04/09/22 2015  Location: Coccyx  Location Orientation:   Staging: Stage 2 -  Partial thickness loss of dermis presenting as a shallow open injury with a red, pink wound bed without slough.  Wound Description (Comments):   Present on Admission: Yes  Dressing Type  Foam - Lift dressing to assess site every shift 04/15/22 1050     Data Reviewed:   I have personally reviewed following labs and imaging studies:  Labs: Labs show the following:   Basic Metabolic Panel: Recent Labs  Lab 04/09/22 1331 04/09/22 1800 04/13/22 0420  NA 135  --  136  K 5.0  --  5.1  CL 102  --  106  CO2 22  --  28  GLUCOSE  122*  --  147*  BUN 21  --  18  CREATININE 0.81  --  0.69  CALCIUM 7.7*  --  7.7*  MG  --  1.8 1.9  PHOS  --  2.9  --    GFR Estimated Creatinine Clearance: 62.8 mL/min (by C-G formula based on SCr of 0.69 mg/dL). Liver Function Tests: Recent Labs  Lab 04/09/22 1331  AST 45*  ALT 28  ALKPHOS 77  BILITOT 0.8  PROT 6.3*  ALBUMIN 3.0*   No results for input(s): "LIPASE", "AMYLASE" in the last 168 hours. Recent Labs  Lab 04/09/22 1528  AMMONIA 13   Coagulation profile No results for input(s): "INR", "PROTIME" in the last 168 hours.  CBC: Recent Labs  Lab 04/09/22 1331 04/13/22 0420  WBC 11.9* 9.8  NEUTROABS 10.4*  --   HGB 12.6* 8.7*  HCT 37.9* 26.8*  MCV 92.2 93.7  PLT 639* 437*   Cardiac Enzymes: No results for input(s): "CKTOTAL", "CKMB", "CKMBINDEX", "TROPONINI" in the last 168 hours. BNP (last 3 results) No results for input(s): "PROBNP" in the last 8760 hours. CBG: Recent Labs  Lab 04/13/22 1206 04/13/22 2024 04/14/22 0819 04/14/22 1235 04/15/22 0759  GLUCAP 153* 170* 93 144* 100*   D-Dimer: No results for input(s): "DDIMER" in the last 72 hours. Hgb A1c: No results for input(s): "HGBA1C" in the last 72 hours. Lipid Profile: No results for input(s): "CHOL", "HDL", "LDLCALC", "TRIG", "CHOLHDL", "LDLDIRECT" in the last 72 hours. Thyroid function studies: No results for input(s): "TSH", "T4TOTAL", "T3FREE", "THYROIDAB" in the last 72 hours.  Invalid input(s): "FREET3" Anemia work up: No results for input(s): "VITAMINB12", "FOLATE", "FERRITIN", "TIBC", "IRON", "RETICCTPCT" in the last 72 hours. Sepsis  Labs: Recent Labs  Lab 04/09/22 1331 04/09/22 1528 04/13/22 0420  PROCALCITON 49.03  --   --   WBC 11.9*  --  9.8  LATICACIDVEN 1.6 1.0  --     Microbiology Recent Results (from the past 240 hour(s))  Blood culture (routine x 2)     Status: None   Collection Time: 04/09/22  1:36 PM   Specimen: BLOOD  Result Value Ref Range Status   Specimen Description BLOOD RIGHT ANTECUBITAL  Final   Special Requests   Final    BOTTLES DRAWN AEROBIC AND ANAEROBIC Blood Culture adequate volume   Culture   Final    NO GROWTH 5 DAYS Performed at [facility], [address], [place], [place] [address]    Report Status 04/14/2022 FINAL  Final  Resp panel by RT-PCR (RSV, Flu A&B, Covid) Anterior Nasal Swab     Status: None   Collection Time: 04/09/22  3:28 PM   Specimen: Anterior Nasal Swab  Result Value Ref Range Status   SARS Coronavirus 2 by RT PCR NEGATIVE NEGATIVE Final    Comment: (NOTE) SARS-CoV-2 target nucleic acids are NOT DETECTED.  The SARS-CoV-2 RNA is generally detectable in upper respiratory specimens during the acute phase of infection. The lowest concentration of SARS-CoV-2 viral copies this assay can detect is 138 copies/mL. A negative result does not preclude SARS-Cov-2 infection and should not be used as the sole basis for treatment or other patient management decisions. A negative result may occur with  improper specimen collection/handling, submission of specimen other than nasopharyngeal swab, presence of viral mutation(s) within the areas targeted by this assay, and inadequate number of viral copies(<138 copies/mL). A negative result must be combined with clinical observations, patient history, and epidemiological information. The expected result is  Negative.  Fact Sheet for Patients:  EntrepreneurPulse.com.au  Fact Sheet for Healthcare Providers:  IncredibleEmployment.be  This test is no t yet approved or cleared by  the [place] FDA and  has been authorized for detection and/or diagnosis of SARS-CoV-2 by FDA under an Emergency Use Authorization (EUA). This EUA will remain  in effect (meaning this test can be used) for the duration of the COVID-19 declaration under Section 564(b)(1) of the Act, 21 U.S.C.section 360bbb-3(b)(1), unless the authorization is terminated  or revoked sooner.       Influenza A by PCR NEGATIVE NEGATIVE Final   Influenza B by PCR NEGATIVE NEGATIVE Final    Comment: (NOTE) The Xpert Xpress SARS-CoV-2/FLU/RSV plus assay is intended as an aid in the diagnosis of influenza from Nasopharyngeal swab specimens and should not be used as a sole basis for treatment. Nasal washings and aspirates are unacceptable for Xpert Xpress SARS-CoV-2/FLU/RSV testing.  Fact Sheet for Patients: EntrepreneurPulse.com.au  Fact Sheet for Healthcare Providers: IncredibleEmployment.be  This test is not yet approved or cleared by the [place] FDA and has been authorized for detection and/or diagnosis of SARS-CoV-2 by FDA under an Emergency Use Authorization (EUA). This EUA will remain in effect (meaning this test can be used) for the duration of the COVID-19 declaration under Section 564(b)(1) of the Act, 21 U.S.C. section 360bbb-3(b)(1), unless the authorization is terminated or revoked.     Resp Syncytial Virus by PCR NEGATIVE NEGATIVE Final    Comment: (NOTE) Fact Sheet for Patients: EntrepreneurPulse.com.au  Fact Sheet for Healthcare Providers: IncredibleEmployment.be  This test is not yet approved or cleared by the [place] FDA and has been authorized for detection and/or diagnosis of SARS-CoV-2 by FDA under an Emergency Use Authorization (EUA). This EUA will remain in effect (meaning this test can be used) for the duration of the COVID-19 declaration under Section 564(b)(1) of the Act, 21  U.S.C. section 360bbb-3(b)(1), unless the authorization is terminated or revoked.  Performed at [facility], [address] [place], [place] [address]   Blood culture (routine x 2)     Status: None   Collection Time: 04/09/22  8:40 PM   Specimen: Left Antecubital; Blood  Result Value Ref Range Status   Specimen Description LEFT ANTECUBITAL  Final   Special Requests   Final    BOTTLES DRAWN AEROBIC AND ANAEROBIC Blood Culture adequate volume   Culture   Final    NO GROWTH 5 DAYS Performed at [facility], [address], [place], [place] [address]    Report Status 04/14/2022 FINAL  Final  C Difficile Quick Screen w PCR reflex     Status: Abnormal   Collection Time: 04/10/22 12:02 PM   Specimen: STOOL  Result Value Ref Range Status   C Diff antigen POSITIVE (A) NEGATIVE Final   C Diff toxin NEGATIVE NEGATIVE Final   C Diff interpretation Results are indeterminate. See PCR results.  Final    Comment: Performed at [facility], [address] [place], [place] [address]  C. Diff by PCR, Reflexed     Status: Abnormal   Collection Time: 04/10/22 12:02 PM  Result Value Ref Range Status   Toxigenic C. Difficile by PCR POSITIVE (A) NEGATIVE Final    Comment: Positive for toxigenic C. difficile with little to no toxin production. Only treat if clinical presentation suggests symptomatic illness. Performed at [facility], [address] [place],  [address]     Procedures and diagnostic studies:  No results found.             LOS: 5 days   Alicen Donalson  [facility]   Pager on [email or web address]. If 7PM-7AM, please contact night-coverage at [email or web address]     04/15/2022, 1:44 PM

## 2022-04-15 NOTE — Progress Notes (Signed)
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 Pt reports he feels tired from recent bath with assist from staff. Tx session targeted improving functional activity tolerance for improved ADL participation. Pt is making slow progress towards goals, MOD A required for bed mobility, STS with MIN-MOD A, short amb transfer ro bedside chair with MIN A. Pt appears with improved one step direction following and engagement in task throughout on this date. Discharge recommendation remains appropriate. OT will continue to follow acutely.    Recommendations for follow up therapy are one component of a multi-disciplinary discharge planning process, led by the attending physician.  Recommendations may be updated based on patient status, additional functional criteria and insurance authorization.    Follow Up Recommendations  Skilled nursing-short term rehab (<3 hours/day)     Assistance Recommended at Discharge Frequent or constant Supervision/Assistance  Patient can return home with the following  A lot of help with walking and/or transfers;A lot of help with bathing/dressing/bathroom   Equipment Recommendations  Other (comment) (per next venue of care)    Recommendations for Other Services      Precautions / Restrictions Precautions Precautions: Fall Precaution Comments: Rt heel wound/ poor overall skin  integrity Restrictions Weight Bearing Restrictions: No       Mobility Bed Mobility Overal bed mobility: Needs Assistance Bed Mobility: Supine to Sit     Supine to sit: Mod assist          Transfers Overall transfer level: Needs assistance Equipment used: Rolling walker (2 wheels) Transfers: Sit to/from Stand Sit to Stand: Min assist, Mod assist (5 attempts from chair with frequent vcs for technique, body mechanics)                 Balance Overall balance assessment: Needs assistance Sitting-balance support: Feet supported Sitting balance-Leahy Scale: Fair     Standing balance support: Bilateral upper extremity supported, During functional activity, Reliant on assistive device for balance Standing balance-Leahy Scale: Poor                             ADL either performed or assessed with clinical judgement   ADL Overall ADL's : Needs assistance/impaired Eating/Feeding: Set up;Sitting   Grooming: Wash/dry hands;Wash/dry face;Sitting;Supervision/safety               Lower Body Dressing: Maximal assistance   Toilet Transfer: Min guard;Rolling walker (2 wheels) Toilet Transfer Details (indicate cue type and reason): step pivot to bedside chair, intermittent vcs for technique         Functional mobility during ADLs: Min guard;Rolling walker (2 wheels)      Extremity/Trunk Assessment              Vision       Perception     Praxis      Cognition Arousal/Alertness: Awake/alert Behavior During Therapy: Flat affect Overall Cognitive Status: No family/caregiver present to  determine baseline cognitive functioning Area of Impairment: Attention, Memory, Following commands, Safety/judgement, Awareness, Problem solving, Orientation                 Orientation Level: Disoriented to, Time Current Attention Level: Focused Memory: Decreased short-term memory Following Commands: Follows one step commands with increased  time Safety/Judgement: Decreased awareness of safety, Decreased awareness of deficits Awareness: Intellectual Problem Solving: Decreased initiation, Slow processing, Difficulty sequencing, Requires verbal cues, Requires tactile cues          Exercises      Shoulder Instructions       General Comments spo2 >90% on 2 L via Y[place]; HR up to 125 with mobility    Pertinent Vitals/ Pain       Pain Assessment Pain Assessment: 0-10 Pain Score: 6  Pain Location: R bottom of his foot Pain Descriptors / Indicators: Discomfort Pain Intervention(s): Monitored during session, Patient requesting pain meds-RN notified  Home Living                                          Prior Functioning/Environment              Frequency  Min 2X/week        Progress Toward Goals  OT Goals(current goals can now be found in the care plan section)  Progress towards OT goals: Progressing toward goals     Plan Discharge plan remains appropriate    Co-evaluation                 AM-PAC OT "6 Clicks" Daily Activity     Outcome Measure   Help from another person eating meals?: None Help from another person taking care of personal grooming?: A Little Help from another person toileting, which includes using toliet, bedpan, or urinal?: A Lot Help from another person bathing (including washing, rinsing, drying)?: A Lot Help from another person to put on and taking off regular upper body clothing?: A Little Help from another person to put on and taking off regular lower body clothing?: A Lot 6 Click Score: 16    End of Session Equipment Utilized During Treatment: Rolling walker (2 wheels);Oxygen  OT Visit Diagnosis: Unsteadiness on feet (R26.81);Muscle weakness (generalized) (M62.81)   Activity Tolerance Patient tolerated treatment well   Patient Left in chair;with call bell/phone within reach;with chair alarm set   Nurse Communication Mobility status;Patient requests  pain meds        Time: 1116-1130 OT Time Calculation (min): 14 min  Charges: OT General Charges $OT Visit: 1 Visit OT Treatments $Therapeutic Activity: 8-22 mins Shanon Payor, OTD OTR/L  04/15/22, 12:57 PM

## 2022-04-16 DIAGNOSIS — A0472 Enterocolitis due to Clostridium difficile, not specified as recurrent: Secondary | ICD-10-CM

## 2022-04-16 DIAGNOSIS — I679 Cerebrovascular disease, unspecified: Secondary | ICD-10-CM | POA: Diagnosis not present

## 2022-04-16 DIAGNOSIS — I5032 Chronic diastolic (congestive) heart failure: Secondary | ICD-10-CM | POA: Diagnosis not present

## 2022-04-16 LAB — CBC WITH DIFFERENTIAL/PLATELET
Abs Immature Granulocytes: 0.05 10*3/uL (ref 0.00–0.07)
Basophils Absolute: 0 10*3/uL (ref 0.0–0.1)
Basophils Relative: 0 %
Eosinophils Absolute: 0.1 10*3/uL (ref 0.0–0.5)
Eosinophils Relative: 1 %
HCT: 26.7 % — ABNORMAL LOW (ref 39.0–52.0)
Hemoglobin: 9.1 g/dL — ABNORMAL LOW (ref 13.0–17.0)
Immature Granulocytes: 0 %
Lymphocytes Relative: 6 %
Lymphs Abs: 0.8 10*3/uL (ref 0.7–4.0)
MCH: 30.3 pg (ref 26.0–34.0)
MCHC: 34.1 g/dL (ref 30.0–36.0)
MCV: 89 fL (ref 80.0–100.0)
Monocytes Absolute: 1.2 10*3/uL — ABNORMAL HIGH (ref 0.1–1.0)
Monocytes Relative: 9 %
Neutro Abs: 10.4 10*3/uL — ABNORMAL HIGH (ref 1.7–7.7)
Neutrophils Relative %: 84 %
Platelets: 511 10*3/uL — ABNORMAL HIGH (ref 150–400)
RBC: 3 MIL/uL — ABNORMAL LOW (ref 4.22–5.81)
RDW: 15.2 % (ref 11.5–15.5)
WBC: 12.6 10*3/uL — ABNORMAL HIGH (ref 4.0–10.5)
nRBC: 0 % (ref 0.0–0.2)

## 2022-04-16 LAB — BASIC METABOLIC PANEL
Anion gap: 4 — ABNORMAL LOW (ref 5–15)
BUN: 21 mg/dL (ref 8–23)
CO2: 34 mmol/L — ABNORMAL HIGH (ref 22–32)
Calcium: 8.5 mg/dL — ABNORMAL LOW (ref 8.9–10.3)
Chloride: 95 mmol/L — ABNORMAL LOW (ref 98–111)
Creatinine, Ser: 0.72 mg/dL (ref 0.61–1.24)
GFR, Estimated: >60 mL/min (ref >60)
Glucose, Bld: 112 mg/dL — ABNORMAL HIGH (ref 70–99)
Potassium: 4.6 mmol/L (ref 3.5–5.1)
Sodium: 133 mmol/L — ABNORMAL LOW (ref 135–145)

## 2022-04-16 LAB — MAGNESIUM: Magnesium: 2 mg/dL (ref 1.7–2.4)

## 2022-04-16 LAB — GLUCOSE, CAPILLARY: Glucose-Capillary: 95 mg/dL (ref 70–99)

## 2022-04-16 LAB — PHOSPHORUS: Phosphorus: 4.6 mg/dL (ref 2.5–4.6)

## 2022-04-16 NOTE — Progress Notes (Addendum)
Progress Note    Lucas Ramirez  VOH:[id number] DOB: [birth year]  DOA: 04/09/2022 PCP: Birdie Sons, MD      Brief Narrative:    Medical records reviewed and are as summarized below:  Lucas Ramirez is a 66 y.o. male  with medical history significant of COPD and chronic smoker, chronic back pain on oxycodone, Bell's palsy, hypertension, hyperlipidemia, GERD, poor health maintenance, cachexia of unknown cause and extensively investigated with his primary care physician's office, history of stroke currently on Plavix brought from home with confusion, lethargy, shortness of breath ongoing for more than a month and with progressive worsening few days prior to admission.  He complained of inability to walk because of profound generalized weakness. .     Assessment/Plan:   Principal Problem:   Shortness of breath Active Problems:   HLD (hyperlipidemia)   HTN (hypertension)   Stroke (cerebrum) (HCC)   Cerebrovascular disease   Tobacco dependence   Cachexia (HCC)   Pressure injury of skin   Protein-calorie malnutrition, severe   Chronic diastolic CHF (congestive heart failure) (HCC)   C. difficile diarrhea   Nutrition Problem: Severe Malnutrition Etiology: chronic illness (COPD)  Signs/Symptoms: moderate fat depletion, severe fat depletion, moderate muscle depletion, severe muscle depletion   Body mass index is 16.74 kg/m.   Shortness of breath, likely multifactorial: Improved.  He is tolerating room air.  CTA negative for pulmonary embolism, no acute infection but there is evidence of chronic scarring with honeycombing.  Acute stroke/right infarcts in the right frontal lobe: Continue aspirin and Plavix.  LDL was 50  C. difficile diarrhea: C. difficile toxin was negative but toxigenic C. difficile PCR and C. difficile antigen were positive.  Continue vancomycin until 04/20/2022.    Hypertension, chronic diastolic CHF: BP is better.  Continue losartan and Lasix.   Continue Lasix. 2D echo from 04/10/2022 showed EF estimated at 60 to 69%, grade 1 diastolic dysfunction  Failure to thrive, cachexia, multivitamin deficiency (B6, K1, D, E), iron deficiency: Continue Megace and Remeron.  Vitamin A, B1, B12, C levels were normal.  Continue ferrous sulfate and multivitamins  Follow-up with dietitian.  TOC/social worker to follow-up with DSS.  Asymmetric wounds on the right thigh: Continue local wound care  Other comorbidities include hypertension, hyperlipidemia, tobacco use disorder    Diet Order             DIET DYS 3 Room service appropriate? Yes with Assist; Fluid consistency: Thin  Diet effective now                            Consultants: Neurologist Palliative care  Procedures: None    Medications:     stroke: early stages of recovery book   Does not apply Once   (feeding supplement) PROSource Plus  30 mL Oral TID BM   aspirin EC  81 mg Oral Daily   calcium carbonate  500 mg of elemental calcium Oral BID WC   cholecalciferol  2,000 Units Oral Daily   clopidogrel  75 mg Oral Daily   enoxaparin (LOVENOX) injection  40 mg Subcutaneous Q24H   feeding supplement  237 mL Oral QID   ferrous sulfate  325 mg Oral QODAY   furosemide  20 mg Oral Daily   gabapentin  800 mg Oral TID   latanoprost  1-2 drop Both Eyes QHS   losartan  25 mg Oral Daily   megestrol  400 mg Oral BID   mirtazapine  7.5 mg Oral QHS   multivitamin with minerals  1 tablet Oral Daily   nicotine  21 mg Transdermal Daily   pantoprazole  40 mg Oral Daily   silver sulfADIAZINE   Topical Daily   vancomycin  125 mg Oral QID   Continuous Infusions:   Anti-infectives (From admission, onward)    Start     Dose/Rate Route Frequency Ordered Stop   04/10/22 1530  vancomycin (VANCOCIN) capsule 125 mg        125 mg Oral 4 times daily 04/10/22 1437 04/20/22 1359   04/09/22 1730  vancomycin (VANCOREADY) IVPB 1250 mg/250 mL        1,250 mg 166.7 mL/hr over 90  Minutes Intravenous  Once 04/09/22 1715 04/09/22 1945   04/09/22 1715  ceFEPIme (MAXIPIME) 2 g in sodium chloride 0.9 % 100 mL IVPB        2 g 200 mL/hr over 30 Minutes Intravenous  Once 04/09/22 1715 04/09/22 1909              Family Communication/Anticipated D/C date and plan/Code Status   DVT prophylaxis: enoxaparin (LOVENOX) injection 40 mg Start: 04/10/22 0800     Code Status: DNR  Family Communication: None Disposition Plan: Plan to discharge to SNF   Status is: Inpatient Remains inpatient appropriate because: TOC working on safe disposition plan       Subjective:   Interval events noted.  He had semiformed stools this morning.  Objective:    Vitals:   04/15/22 1934 04/16/22 0500 04/16/22 0555 04/16/22 0818  BP: (!) 163/73  (!) 145/74 (!) 145/69  Pulse: (!) 104  92 90  Resp:   20 18  Temp: 98.1 F (36.7 C)  98.3 F (36.8 C) (!) 97.5 F (36.4 C)  TempSrc: Oral  Oral Oral  SpO2:   97% 95%  Weight:  48.5 kg    Height:       No data found.   Intake/Output Summary (Last 24 hours) at 04/16/2022 1313 Last data filed at 04/16/2022 5093 Gross per 24 hour  Intake --  Output 2200 ml  Net -2200 ml   Filed Weights   04/14/22 0500 04/15/22 0255 04/16/22 0500  Weight: 50.5 kg 48.9 kg 48.5 kg    Exam:  GEN: NAD SKIN: Multiple bruises on bilateral upper extremities, chronic wounds on right thigh EYES: No pallor or icterus ENT: MMM CV: RRR PULM: CTA B ABD: soft, ND, NT, +BS CNS: AAO x 3, non focal EXT: B/l leg edema, no tenderness      Pressure Injury 04/09/22 Coccyx Stage 2 -  Partial thickness loss of dermis presenting as a shallow open injury with a red, pink wound bed without slough. (Active)  04/09/22 2015  Location: Coccyx  Location Orientation:   Staging: Stage 2 -  Partial thickness loss of dermis presenting as a shallow open injury with a red, pink wound bed without slough.  Wound Description (Comments):   Present on Admission:  Yes  Dressing Type Foam - Lift dressing to assess site every shift 04/16/22 1100     Data Reviewed:   I have personally reviewed following labs and imaging studies:  Labs: Labs show the following:   Basic Metabolic Panel: Recent Labs  Lab 04/09/22 1331 04/09/22 1800 04/13/22 0420 04/16/22 0505  NA 135  --  136 133*  K 5.0  --  5.1 4.6  CL 102  --  106 95*  CO2 22  --  28 34*  GLUCOSE 122*  --  147* 112*  BUN 21  --  18 21  CREATININE 0.81  --  0.69 0.72  CALCIUM 7.7*  --  7.7* 8.5*  MG  --  1.8 1.9 2.0  PHOS  --  2.9  --  4.6   GFR Estimated Creatinine Clearance: 62.3 mL/min (by C-G formula based on SCr of 0.72 mg/dL). Liver Function Tests: Recent Labs  Lab 04/09/22 1331  AST 45*  ALT 28  ALKPHOS 77  BILITOT 0.8  PROT 6.3*  ALBUMIN 3.0*   No results for input(s): "LIPASE", "AMYLASE" in the last 168 hours. Recent Labs  Lab 04/09/22 1528  AMMONIA 13   Coagulation profile No results for input(s): "INR", "PROTIME" in the last 168 hours.  CBC: Recent Labs  Lab 04/09/22 1331 04/13/22 0420 04/16/22 0505  WBC 11.9* 9.8 12.6*  NEUTROABS 10.4*  --  10.4*  HGB 12.6* 8.7* 9.1*  HCT 37.9* 26.8* 26.7*  MCV 92.2 93.7 89.0  PLT 639* 437* 511*   Cardiac Enzymes: No results for input(s): "CKTOTAL", "CKMB", "CKMBINDEX", "TROPONINI" in the last 168 hours. BNP (last 3 results) No results for input(s): "PROBNP" in the last 8760 hours. CBG: Recent Labs  Lab 04/13/22 2024 04/14/22 0819 04/14/22 1235 04/15/22 0759 04/16/22 0820  GLUCAP 170* 93 144* 100* 95   D-Dimer: No results for input(s): "DDIMER" in the last 72 hours. Hgb A1c: No results for input(s): "HGBA1C" in the last 72 hours. Lipid Profile: No results for input(s): "CHOL", "HDL", "LDLCALC", "TRIG", "CHOLHDL", "LDLDIRECT" in the last 72 hours. Thyroid function studies: No results for input(s): "TSH", "T4TOTAL", "T3FREE", "THYROIDAB" in the last 72 hours.  Invalid input(s): "FREET3" Anemia work  up: No results for input(s): "VITAMINB12", "FOLATE", "FERRITIN", "TIBC", "IRON", "RETICCTPCT" in the last 72 hours. Sepsis Labs: Recent Labs  Lab 04/09/22 1331 04/09/22 1528 04/13/22 0420 04/16/22 0505  PROCALCITON 49.03  --   --   --   WBC 11.9*  --  9.8 12.6*  LATICACIDVEN 1.6 1.0  --   --     Microbiology Recent Results (from the past 240 hour(s))  Blood culture (routine x 2)     Status: None   Collection Time: 04/09/22  1:36 PM   Specimen: BLOOD  Result Value Ref Range Status   Specimen Description BLOOD RIGHT ANTECUBITAL  Final   Special Requests   Final    BOTTLES DRAWN AEROBIC AND ANAEROBIC Blood Culture adequate volume   Culture   Final    NO GROWTH 5 DAYS Performed at [facility], [address] [place], [place] [address]    Report Status 04/14/2022 FINAL  Final  Resp panel by RT-PCR (RSV, Flu A&B, Covid) Anterior Nasal Swab     Status: None   Collection Time: 04/09/22  3:28 PM   Specimen: Anterior Nasal Swab  Result Value Ref Range Status   SARS Coronavirus 2 by RT PCR NEGATIVE NEGATIVE Final    Comment: (NOTE) SARS-CoV-2 target nucleic acids are NOT DETECTED.  The SARS-CoV-2 RNA is generally detectable in upper respiratory specimens during the acute phase of infection. The lowest concentration of SARS-CoV-2 viral copies this assay can detect is 138 copies/mL. A negative result does not preclude SARS-Cov-2 infection and should not be used as the sole basis for treatment or other patient management decisions. A negative result may occur with  improper specimen collection/handling, submission of specimen other than nasopharyngeal swab, presence of viral mutation(s)  within the areas targeted by this assay, and inadequate number of viral copies(<138 copies/mL). A negative result must be combined with clinical observations, patient history, and epidemiological information. The expected result is Negative.  Fact Sheet for Patients:   EntrepreneurPulse.com.au  Fact Sheet for Healthcare Providers:  IncredibleEmployment.be  This test is no t yet approved or cleared by the [place] FDA and  has been authorized for detection and/or diagnosis of SARS-CoV-2 by FDA under an Emergency Use Authorization (EUA). This EUA will remain  in effect (meaning this test can be used) for the duration of the COVID-19 declaration under Section 564(b)(1) of the Act, 21 U.S.C.section 360bbb-3(b)(1), unless the authorization is terminated  or revoked sooner.       Influenza A by PCR NEGATIVE NEGATIVE Final   Influenza B by PCR NEGATIVE NEGATIVE Final    Comment: (NOTE) The Xpert Xpress SARS-CoV-2/FLU/RSV plus assay is intended as an aid in the diagnosis of influenza from Nasopharyngeal swab specimens and should not be used as a sole basis for treatment. Nasal washings and aspirates are unacceptable for Xpert Xpress SARS-CoV-2/FLU/RSV testing.  Fact Sheet for Patients: EntrepreneurPulse.com.au  Fact Sheet for Healthcare Providers: IncredibleEmployment.be  This test is not yet approved or cleared by the [place] FDA and has been authorized for detection and/or diagnosis of SARS-CoV-2 by FDA under an Emergency Use Authorization (EUA). This EUA will remain in effect (meaning this test can be used) for the duration of the COVID-19 declaration under Section 564(b)(1) of the Act, 21 U.S.C. section 360bbb-3(b)(1), unless the authorization is terminated or revoked.     Resp Syncytial Virus by PCR NEGATIVE NEGATIVE Final    Comment: (NOTE) Fact Sheet for Patients: EntrepreneurPulse.com.au  Fact Sheet for Healthcare Providers: IncredibleEmployment.be  This test is not yet approved or cleared by the [place] FDA and has been authorized for detection and/or diagnosis of SARS-CoV-2 by FDA under an Emergency Use  Authorization (EUA). This EUA will remain in effect (meaning this test can be used) for the duration of the COVID-19 declaration under Section 564(b)(1) of the Act, 21 U.S.C. section 360bbb-3(b)(1), unless the authorization is terminated or revoked.  Performed at [facility], [address] [place], [place] [address]   Blood culture (routine x 2)     Status: None   Collection Time: 04/09/22  8:40 PM   Specimen: Left Antecubital; Blood  Result Value Ref Range Status   Specimen Description LEFT ANTECUBITAL  Final   Special Requests   Final    BOTTLES DRAWN AEROBIC AND ANAEROBIC Blood Culture adequate volume   Culture   Final    NO GROWTH 5 DAYS Performed at [facility], [address], [place], [place] [address]    Report Status 04/14/2022 FINAL  Final  C Difficile Quick Screen w PCR reflex     Status: Abnormal   Collection Time: 04/10/22 12:02 PM   Specimen: STOOL  Result Value Ref Range Status   C Diff antigen POSITIVE (A) NEGATIVE Final   C Diff toxin NEGATIVE NEGATIVE Final   C Diff interpretation Results are indeterminate. See PCR results.  Final    Comment: Performed at [facility], [address] [place], [place] [address]  C. Diff by PCR, Reflexed     Status: Abnormal   Collection Time: 04/10/22 12:02 PM  Result Value Ref Range Status   Toxigenic C. Difficile by PCR POSITIVE (A) NEGATIVE Final    Comment: Positive for toxigenic C. difficile with little  to no toxin production. Only treat if clinical presentation suggests symptomatic illness. Performed at [facility], [address] [place], [place] [address]     Procedures and diagnostic studies:  No results found.             LOS: 6 days   Evia Goldsmith  [facility]   Pager on [email or web address]. If 7PM-7AM, please contact night-coverage at [email or web address]     04/16/2022, 1:13 PM

## 2022-04-17 DIAGNOSIS — A0472 Enterocolitis due to Clostridium difficile, not specified as recurrent: Secondary | ICD-10-CM | POA: Diagnosis not present

## 2022-04-17 DIAGNOSIS — I5032 Chronic diastolic (congestive) heart failure: Secondary | ICD-10-CM | POA: Diagnosis not present

## 2022-04-17 LAB — GLUCOSE, CAPILLARY: Glucose-Capillary: 107 mg/dL — ABNORMAL HIGH (ref 70–99)

## 2022-04-17 NOTE — TOC Progression Note (Signed)
Transition of Care [facility]) - Progression Note    Patient Details  Name: Lucas Ramirez MRN: [id number] Date of Birth: [birth year]  Transition of Care [facility]) CM/SW [place], [place] Phone Number: 04/17/2022, 12:16 PM  Clinical Narrative:     CSW spoke with pt's spouse about pending bed offer, she declines this offer, agreeable to pt being faxed out to [place] area.         Expected Discharge Plan and Services                                               Social Determinants of Health (SDOH) Interventions SDOH Screenings   Food Insecurity: No Food Insecurity (01/24/2022)  Housing: Low Risk  (01/24/2022)  Transportation Needs: No Transportation Needs (01/24/2022)  Utilities: Not At Risk (01/24/2022)  Alcohol Screen: Low Risk  (01/24/2022)  Depression (PHQ2-9): Low Risk  (01/24/2022)  Recent Concern: Depression (PHQ2-9) - Medium Risk (12/24/2021)  Financial Resource Strain: Low Risk  (01/24/2022)  Physical Activity: Inactive (01/24/2022)  Social Connections: Moderately Isolated (01/24/2022)  Stress: No Stress Concern Present (01/24/2022)  Tobacco Use: High Risk (04/09/2022)    Readmission Risk Interventions     No data to display

## 2022-04-17 NOTE — Progress Notes (Signed)
Progress Note    Lucas Ramirez  SWN:[id number] DOB: 17-Oct-1955  DOA: 04/09/2022 PCP: Birdie Sons, MD      Brief Narrative:    Medical records reviewed and are as summarized below:  Lucas Ramirez is a 66 y.o. male  with medical history significant of COPD and chronic smoker, chronic back pain on oxycodone, Bell's palsy, hypertension, hyperlipidemia, GERD, poor health maintenance, cachexia of unknown cause and extensively investigated with his primary care physician's office, history of stroke currently on Plavix brought from home with confusion, lethargy, shortness of breath ongoing for more than a month and with progressive worsening few days prior to admission.  He complained of inability to walk because of profound generalized weakness. .     Assessment/Plan:   Principal Problem:   Shortness of breath Active Problems:   HLD (hyperlipidemia)   HTN (hypertension)   Stroke (cerebrum) (HCC)   Cerebrovascular disease   Tobacco dependence   Cachexia (HCC)   Pressure injury of skin   Protein-calorie malnutrition, severe   Chronic diastolic CHF (congestive heart failure) (HCC)   C. difficile diarrhea   Nutrition Problem: Severe Malnutrition Etiology: chronic illness (COPD)  Signs/Symptoms: moderate fat depletion, severe fat depletion, moderate muscle depletion, severe muscle depletion   Body mass index is 15.96 kg/m.   Shortness of breath, likely multifactorial: Improved.  He is tolerating room air.  CTA negative for pulmonary embolism, no acute infection but there is evidence of chronic scarring with honeycombing.  Acute stroke/right infarcts in the right frontal lobe: Continue aspirin and Plavix.  LDL was 50.  C. difficile diarrhea: Diarrhea is improving.  Continue vancomycin through 04/20/2022.  C. difficile toxin was negative but toxigenic C. difficile PCR and C. difficile antigen were positive.    Hypertension, chronic diastolic CHF: BP is okay.  Continue  Lasix and losartan.  2D echo from 04/10/2022 showed EF estimated at 60 to 93%, grade 1 diastolic dysfunction  Failure to thrive, cachexia, multivitamin deficiency (B6, K1, D, E), iron deficiency: Continue Megace and Remeron.  Vitamin A, B1, B12, C levels were normal.  Continue ferrous sulfate and multivitamins  Follow-up with dietitian.  TOC/social worker to follow-up with DSS.  Asymmetric wounds on the right thigh: Continue local wound care  Other comorbidities include hypertension, hyperlipidemia, tobacco use disorder    Diet Order             DIET DYS 3 Room service appropriate? Yes with Assist; Fluid consistency: Thin  Diet effective now                            Consultants: Neurologist Palliative care  Procedures: None    Medications:     stroke: early stages of recovery book   Does not apply Once   (feeding supplement) PROSource Plus  30 mL Oral TID BM   aspirin EC  81 mg Oral Daily   calcium carbonate  500 mg of elemental calcium Oral BID WC   cholecalciferol  2,000 Units Oral Daily   clopidogrel  75 mg Oral Daily   enoxaparin (LOVENOX) injection  40 mg Subcutaneous Q24H   feeding supplement  237 mL Oral QID   ferrous sulfate  325 mg Oral QODAY   furosemide  20 mg Oral Daily   gabapentin  800 mg Oral TID   latanoprost  1-2 drop Both Eyes QHS   losartan  25 mg Oral Daily  megestrol  400 mg Oral BID   mirtazapine  7.5 mg Oral QHS   multivitamin with minerals  1 tablet Oral Daily   nicotine  21 mg Transdermal Daily   pantoprazole  40 mg Oral Daily   silver sulfADIAZINE   Topical Daily   vancomycin  125 mg Oral QID   Continuous Infusions:   Anti-infectives (From admission, onward)    Start     Dose/Rate Route Frequency Ordered Stop   04/10/22 1530  vancomycin (VANCOCIN) capsule 125 mg        125 mg Oral 4 times daily 04/10/22 1437 04/20/22 1359   04/09/22 1730  vancomycin (VANCOREADY) IVPB 1250 mg/250 mL        1,250 mg 166.7 mL/hr over  90 Minutes Intravenous  Once 04/09/22 1715 04/09/22 1945   04/09/22 1715  ceFEPIme (MAXIPIME) 2 g in sodium chloride 0.9 % 100 mL IVPB        2 g 200 mL/hr over 30 Minutes Intravenous  Once 04/09/22 1715 04/09/22 1909              Family Communication/Anticipated D/C date and plan/Code Status   DVT prophylaxis: enoxaparin (LOVENOX) injection 40 mg Start: 04/10/22 0800     Code Status: DNR  Family Communication: None Disposition Plan: Plan to discharge to SNF   Status is: Inpatient Remains inpatient appropriate because: TOC working on safe disposition plan       Subjective:   He had formed stools today.  No other complaints.  Nurse reported that patient is very weak and requires 2 people for assistance.  Objective:    Vitals:   04/16/22 2200 04/17/22 0401 04/17/22 0412 04/17/22 0815  BP: 110/60 126/66  117/66  Pulse: (!) 103 (!) 103  100  Resp: '20 19  19  '$ Temp: 98.2 F (36.8 C) 98.5 F (36.9 C)  98.2 F (36.8 C)  TempSrc: Oral     SpO2: 99% 98%  97%  Weight:   46.2 kg   Height:       No data found.   Intake/Output Summary (Last 24 hours) at 04/17/2022 1524 Last data filed at 04/17/2022 0200 Gross per 24 hour  Intake --  Output 500 ml  Net -500 ml   Filed Weights   04/15/22 0255 04/16/22 0500 04/17/22 0412  Weight: 48.9 kg 48.5 kg 46.2 kg    Exam:  GEN: NAD SKIN: Multiple bruises on bilateral upper extremities, chronic wounds on right thigh EYES: EOMI ENT: MMM CV: RRR PULM: CTA B ABD: soft, ND, NT, +BS CNS: AAO x 3, non focal EXT: B/l leg edema, no tenderness     Pressure Injury 04/09/22 Coccyx Stage 2 -  Partial thickness loss of dermis presenting as a shallow open injury with a red, pink wound bed without slough. (Active)  04/09/22 2015  Location: Coccyx  Location Orientation:   Staging: Stage 2 -  Partial thickness loss of dermis presenting as a shallow open injury with a red, pink wound bed without slough.  Wound Description  (Comments):   Present on Admission: Yes  Dressing Type Foam - Lift dressing to assess site every shift 04/17/22 0852     Data Reviewed:   I have personally reviewed following labs and imaging studies:  Labs: Labs show the following:   Basic Metabolic Panel: Recent Labs  Lab 04/13/22 0420 04/16/22 0505  NA 136 133*  K 5.1 4.6  CL 106 95*  CO2 28 34*  GLUCOSE 147* 112*  BUN 18 21  CREATININE 0.69 0.72  CALCIUM 7.7* 8.5*  MG 1.9 2.0  PHOS  --  4.6   GFR Estimated Creatinine Clearance: 59.4 mL/min (by C-G formula based on SCr of 0.72 mg/dL). Liver Function Tests: No results for input(s): "AST", "ALT", "ALKPHOS", "BILITOT", "PROT", "ALBUMIN" in the last 168 hours.  No results for input(s): "LIPASE", "AMYLASE" in the last 168 hours. No results for input(s): "AMMONIA" in the last 168 hours.  Coagulation profile No results for input(s): "INR", "PROTIME" in the last 168 hours.  CBC: Recent Labs  Lab 04/13/22 0420 04/16/22 0505  WBC 9.8 12.6*  NEUTROABS  --  10.4*  HGB 8.7* 9.1*  HCT 26.8* 26.7*  MCV 93.7 89.0  PLT 437* 511*   Cardiac Enzymes: No results for input(s): "CKTOTAL", "CKMB", "CKMBINDEX", "TROPONINI" in the last 168 hours. BNP (last 3 results) No results for input(s): "PROBNP" in the last 8760 hours. CBG: Recent Labs  Lab 04/14/22 0819 04/14/22 1235 04/15/22 0759 04/16/22 0820 04/17/22 0818  GLUCAP 93 144* 100* 95 107*   D-Dimer: No results for input(s): "DDIMER" in the last 72 hours. Hgb A1c: No results for input(s): "HGBA1C" in the last 72 hours. Lipid Profile: No results for input(s): "CHOL", "HDL", "LDLCALC", "TRIG", "CHOLHDL", "LDLDIRECT" in the last 72 hours. Thyroid function studies: No results for input(s): "TSH", "T4TOTAL", "T3FREE", "THYROIDAB" in the last 72 hours.  Invalid input(s): "FREET3" Anemia work up: No results for input(s): "VITAMINB12", "FOLATE", "FERRITIN", "TIBC", "IRON", "RETICCTPCT" in the last 72 hours. Sepsis  Labs: Recent Labs  Lab 04/13/22 0420 04/16/22 0505  WBC 9.8 12.6*    Microbiology Recent Results (from the past 240 hour(s))  Blood culture (routine x 2)     Status: None   Collection Time: 04/09/22  1:36 PM   Specimen: BLOOD  Result Value Ref Range Status   Specimen Description BLOOD RIGHT ANTECUBITAL  Final   Special Requests   Final    BOTTLES DRAWN AEROBIC AND ANAEROBIC Blood Culture adequate volume   Culture   Final    NO GROWTH 5 DAYS Performed at [facility], [address] [place], [place] [address]    Report Status 04/14/2022 FINAL  Final  Resp panel by RT-PCR (RSV, Flu A&B, Covid) Anterior Nasal Swab     Status: None   Collection Time: 04/09/22  3:28 PM   Specimen: Anterior Nasal Swab  Result Value Ref Range Status   SARS Coronavirus 2 by RT PCR NEGATIVE NEGATIVE Final    Comment: (NOTE) SARS-CoV-2 target nucleic acids are NOT DETECTED.  The SARS-CoV-2 RNA is generally detectable in upper respiratory specimens during the acute phase of infection. The lowest concentration of SARS-CoV-2 viral copies this assay can detect is 138 copies/mL. A negative result does not preclude SARS-Cov-2 infection and should not be used as the sole basis for treatment or other patient management decisions. A negative result may occur with  improper specimen collection/handling, submission of specimen other than nasopharyngeal swab, presence of viral mutation(s) within the areas targeted by this assay, and inadequate number of viral copies(<138 copies/mL). A negative result must be combined with clinical observations, patient history, and epidemiological information. The expected result is Negative.  Fact Sheet for Patients:  EntrepreneurPulse.com.au  Fact Sheet for Healthcare Providers:  IncredibleEmployment.be  This test is no t yet approved or cleared by the [place] FDA and  has been authorized for detection and/or diagnosis  of SARS-CoV-2 by FDA under an Emergency Use Authorization (EUA). This  EUA will remain  in effect (meaning this test can be used) for the duration of the COVID-19 declaration under Section 564(b)(1) of the Act, 21 U.S.C.section 360bbb-3(b)(1), unless the authorization is terminated  or revoked sooner.       Influenza A by PCR NEGATIVE NEGATIVE Final   Influenza B by PCR NEGATIVE NEGATIVE Final    Comment: (NOTE) The Xpert Xpress SARS-CoV-2/FLU/RSV plus assay is intended as an aid in the diagnosis of influenza from Nasopharyngeal swab specimens and should not be used as a sole basis for treatment. Nasal washings and aspirates are unacceptable for Xpert Xpress SARS-CoV-2/FLU/RSV testing.  Fact Sheet for Patients: EntrepreneurPulse.com.au  Fact Sheet for Healthcare Providers: IncredibleEmployment.be  This test is not yet approved or cleared by the [place] FDA and has been authorized for detection and/or diagnosis of SARS-CoV-2 by FDA under an Emergency Use Authorization (EUA). This EUA will remain in effect (meaning this test can be used) for the duration of the COVID-19 declaration under Section 564(b)(1) of the Act, 21 U.S.C. section 360bbb-3(b)(1), unless the authorization is terminated or revoked.     Resp Syncytial Virus by PCR NEGATIVE NEGATIVE Final    Comment: (NOTE) Fact Sheet for Patients: EntrepreneurPulse.com.au  Fact Sheet for Healthcare Providers: IncredibleEmployment.be  This test is not yet approved or cleared by the [place] FDA and has been authorized for detection and/or diagnosis of SARS-CoV-2 by FDA under an Emergency Use Authorization (EUA). This EUA will remain in effect (meaning this test can be used) for the duration of the COVID-19 declaration under Section 564(b)(1) of the Act, 21 U.S.C. section 360bbb-3(b)(1), unless the authorization is terminated  or revoked.  Performed at [facility], [address] [place], [place] [address]   Blood culture (routine x 2)     Status: None   Collection Time: 04/09/22  8:40 PM   Specimen: Left Antecubital; Blood  Result Value Ref Range Status   Specimen Description LEFT ANTECUBITAL  Final   Special Requests   Final    BOTTLES DRAWN AEROBIC AND ANAEROBIC Blood Culture adequate volume   Culture   Final    NO GROWTH 5 DAYS Performed at [facility], [address], [place], [place] [address]    Report Status 04/14/2022 FINAL  Final  C Difficile Quick Screen w PCR reflex     Status: Abnormal   Collection Time: 04/10/22 12:02 PM   Specimen: STOOL  Result Value Ref Range Status   C Diff antigen POSITIVE (A) NEGATIVE Final   C Diff toxin NEGATIVE NEGATIVE Final   C Diff interpretation Results are indeterminate. See PCR results.  Final    Comment: Performed at [facility], [address] [place], [place] [address]  C. Diff by PCR, Reflexed     Status: Abnormal   Collection Time: 04/10/22 12:02 PM  Result Value Ref Range Status   Toxigenic C. Difficile by PCR POSITIVE (A) NEGATIVE Final    Comment: Positive for toxigenic C. difficile with little to no toxin production. Only treat if clinical presentation suggests symptomatic illness. Performed at [facility], [address] [place], [place] [address]     Procedures and diagnostic studies:  No results found.             LOS: 7 days   Chloie Loney  [facility]   Pager on [email or web address]. If 7PM-7AM, please contact night-coverage at [email or web address]     04/17/2022, 3:24 PM

## 2022-04-17 NOTE — Progress Notes (Signed)
SLP Cancellation Note  Patient Details Name: ALEKSANDR PELLOW MRN: [id number] DOB: [birth year]   Cancelled treatment:       Reason Eval/Treat Not Completed:  Chart review completed. Cognitive-linguistic evaluation deferred at this time due to ongoing [place] discussions. Based on pt's CLOF and obvious cognitive-linguistic deficits, anticipate need for frequent/constant supervision and post-acute SLP services if in alignment with GOC. Will continue efforts as appropriate.  Cherrie Gauze, M.S., [facility] [phone] [place])  Quintella Baton 04/17/2022, 9:30 AM

## 2022-04-18 DIAGNOSIS — R0602 Shortness of breath: Secondary | ICD-10-CM | POA: Diagnosis not present

## 2022-04-18 DIAGNOSIS — Z7189 Other specified counseling: Secondary | ICD-10-CM | POA: Diagnosis not present

## 2022-04-18 DIAGNOSIS — A0472 Enterocolitis due to Clostridium difficile, not specified as recurrent: Secondary | ICD-10-CM | POA: Diagnosis not present

## 2022-04-18 DIAGNOSIS — I5032 Chronic diastolic (congestive) heart failure: Secondary | ICD-10-CM | POA: Diagnosis not present

## 2022-04-18 DIAGNOSIS — I679 Cerebrovascular disease, unspecified: Secondary | ICD-10-CM | POA: Diagnosis not present

## 2022-04-18 NOTE — Progress Notes (Signed)
Dr Mal Misty notified of irregular HR, patient with no symptoms.  No new orders.

## 2022-04-18 NOTE — Progress Notes (Signed)
Daily Progress Note   Patient Name: Lucas Ramirez       Date: 04/18/2022 DOB: July 04, 1955  Age: 66 y.o. MRN#: [id number] Attending Physician: Jennye Boroughs, MD Primary Care Physician: Birdie Sons, MD Admit Date: 04/09/2022  Reason for Consultation/Follow-up: Establishing goals of care  Subjective: Notes and labs reviewed. In to see patient. He is lying in bed. He awakens to voice. He denies complaint and states he is happy to be going to rehab. He confirms he would like whatever care is needed, but confirms limit of DNR/DNI.   PMT will shadow for needs.   Length of Stay: 8  Current Medications: Scheduled Meds:    stroke: early stages of recovery book   Does not apply Once   (feeding supplement) PROSource Plus  30 mL Oral TID BM   aspirin EC  81 mg Oral Daily   calcium carbonate  500 mg of elemental calcium Oral BID WC   cholecalciferol  2,000 Units Oral Daily   clopidogrel  75 mg Oral Daily   enoxaparin (LOVENOX) injection  40 mg Subcutaneous Q24H   feeding supplement  237 mL Oral QID   ferrous sulfate  325 mg Oral QODAY   furosemide  20 mg Oral Daily   gabapentin  800 mg Oral TID   latanoprost  1-2 drop Both Eyes QHS   losartan  25 mg Oral Daily   megestrol  400 mg Oral BID   mirtazapine  7.5 mg Oral QHS   multivitamin with minerals  1 tablet Oral Daily   nicotine  21 mg Transdermal Daily   pantoprazole  40 mg Oral Daily   silver sulfADIAZINE   Topical Daily   vancomycin  125 mg Oral QID    Continuous Infusions:   PRN Meds: acetaminophen **OR** acetaminophen, albuterol, guaiFENesin, ondansetron **OR** ondansetron (ZOFRAN) IV, oxyCODONE, senna-docusate, traZODone  Physical Exam Pulmonary:     Effort: Pulmonary effort is normal.  Neurological:     Mental Status:  He is alert.             Vital Signs: BP (!) 145/75 (BP Location: Left Arm)   Pulse 99   Temp 99.1 F (37.3 C)   Resp 20   Ht '5\' 7"'$  (1.702 m)   Wt 48.3 kg   SpO2 96%   BMI  16.66 kg/m  SpO2: SpO2: 96 % O2 Device: O2 Device: Room Air O2 Flow Rate: O2 Flow Rate (L/min): 2 L/min  Intake/output summary:  Intake/Output Summary (Last 24 hours) at 04/18/2022 1210 Last data filed at 04/18/2022 0556 Gross per 24 hour  Intake --  Output 1100 ml  Net -1100 ml   LBM: Last BM Date : 04/17/22 Baseline Weight: Weight: 54.4 kg Most recent weight: Weight: 48.3 kg     Patient Active Problem List   Diagnosis Date Noted   C. difficile diarrhea 04/16/2022   Protein-calorie malnutrition, severe 04/14/2022   Chronic diastolic CHF (congestive heart failure) ([place]) 04/14/2022   Pressure injury of skin 04/10/2022   Shortness of breath 04/09/2022   Cachexia ([place]) 04/09/2022   Sepsis due to pneumonia ([place]) 08/14/2021   Hyponatremia 08/14/2021   Hypertensive urgency 08/14/2021   History of CVA (cerebrovascular accident) 08/14/2021   Dyslipidemia 08/14/2021   Tobacco dependence 06/01/2021   Hypertension, essential 06/01/2021   Carotid stenosis 09/29/2020   Cerebrovascular disease 08/31/2020   History of CVA with residual deficit 08/31/2020   Edema 08/31/2020   Stroke (cerebrum) ([place]) 08/21/2020   Visual loss 08/19/2020   History of adenomatous polyp of colon 07/05/2019   Lymphadenopathy 03/31/2019   Vitamin B6 deficiency 11/28/2018   CAP (community acquired pneumonia) 06/28/2015   COPD exacerbation ([place]) 06/28/2015   HTN (hypertension) 06/28/2015   Back pain, chronic 12/24/2014   Duodenitis 12/24/2014   Esophagitis 12/24/2014   H/O surgical procedure 12/24/2014   HLD (hyperlipidemia) 12/24/2014   Headache, migraine 12/24/2014   Neuropathy 12/24/2014   Borderline diabetes 12/24/2014   Scoliosis 12/24/2014   Compulsive tobacco user syndrome 12/24/2014   Vitamin D deficiency  12/24/2014   Bell's palsy 12/24/2014   Polyneuropathy 12/24/2014    Palliative Care Assessment & Plan    Recommendations/Plan:  Continue treatment with limit of DNR/DNI status.   PMT will shadow.    Code Status:    Code Status Orders  (From admission, onward)           Start     Ordered   04/14/22 1357  Do not attempt resuscitation (DNR)  Continuous       Question Answer Comment  If patient has no pulse and is not breathing Do Not Attempt Resuscitation   If patient has a pulse and/or is breathing: Medical Treatment Goals LIMITED ADDITIONAL INTERVENTIONS: Use medication/IV fluids and cardiac monitoring as indicated; Do not use intubation or mechanical ventilation (DNI), also provide comfort medications.  Transfer to Progressive/Stepdown as indicated, avoid Intensive Care.   Consent: Discussion documented in EHR or advanced directives reviewed      04/14/22 1356           Code Status History     Date Active Date Inactive Code Status Order ID Comments User Context   04/09/2022 1742 04/14/2022 1356 Full Code [id number]  Barb Merino, MD ED   08/14/2021 0409 08/17/2021 1715 Full Code [id number]  Mansy, Arvella Merles, MD Inpatient   08/19/2020 1238 08/21/2020 1451 DNR [id number]  Kayleen Memos, DO ED   06/28/2015 1231 07/02/2015 1416 Full Code [id number]  Idelle Crouch, MD Inpatient      Advance Directive Documentation    Flowsheet Row Most Recent Value  Type of Advance Directive Healthcare Power of Attorney, Living will  Pre-existing out of facility DNR order (yellow form or pink MOST form) --  "MOST" Form in Place? --    Thank you for allowing the Palliative Medicine  Team to assist in the care of this patient.    Asencion Gowda, NP  Please contact Palliative Medicine Team phone at [phone] for questions and concerns.

## 2022-04-18 NOTE — Progress Notes (Signed)
Progress Note    Lucas Ramirez  [id number]:[id number] DOB: [birth year]  DOA: 04/09/2022 PCP: Birdie Sons, MD      Brief Narrative:    Medical records reviewed and are as summarized below:  Lucas Ramirez is a 66 y.o. male  with medical history significant of COPD and chronic smoker, chronic back pain on oxycodone, Bell's palsy, hypertension, hyperlipidemia, GERD, poor health maintenance, cachexia of unknown cause and extensively investigated with his primary care physician's office, history of stroke currently on Plavix brought from home with confusion, lethargy, shortness of breath ongoing for more than a month and with progressive worsening few days prior to admission.  He complained of inability to walk because of profound generalized weakness. .     Assessment/Plan:   Principal Problem:   Shortness of breath Active Problems:   HLD (hyperlipidemia)   HTN (hypertension)   Stroke (cerebrum) (HCC)   Cerebrovascular disease   Tobacco dependence   Cachexia (HCC)   Pressure injury of skin   Protein-calorie malnutrition, severe   Chronic diastolic CHF (congestive heart failure) (HCC)   C. difficile diarrhea   Nutrition Problem: Severe Malnutrition Etiology: chronic illness (COPD)  Signs/Symptoms: moderate fat depletion, severe fat depletion, moderate muscle depletion, severe muscle depletion   Body mass index is 16.66 kg/m.   Shortness of breath, likely multifactorial: Improved.  He is tolerating room air.  CTA negative for pulmonary embolism, no acute infection but there is evidence of chronic scarring with honeycombing.  Acute stroke/right infarcts in the right frontal lobe: Continue aspirin and Plavix.  LDL was 50.  C. difficile diarrhea: Diarrhea has improved.  Continue vancomycin until 04/20/2022. C. difficile toxin was negative but toxigenic C. difficile PCR and C. difficile antigen were positive.    Hypertension, chronic diastolic CHF: Compensated.  Continue  Lasix and losartan.  2D echo from 04/10/2022 showed EF estimated at 60 to 71%, grade 1 diastolic dysfunction  Failure to thrive, cachexia, multivitamin deficiency (B6, K1, D, E), iron deficiency: Continue Megace and Remeron.  Vitamin A, B1, B12, C levels were normal.  Continue ferrous sulfate and multivitamins  Follow-up with dietitian.  TOC/social worker to follow-up with DSS.  Asymmetric wounds on the right thigh: Continue local wound care  Other comorbidities include hypertension, hyperlipidemia, tobacco use disorder    Diet Order             DIET DYS 3 Room service appropriate? Yes with Assist; Fluid consistency: Thin  Diet effective now                            Consultants: Neurologist Palliative care  Procedures: None    Medications:     stroke: early stages of recovery book   Does not apply Once   (feeding supplement) PROSource Plus  30 mL Oral TID BM   aspirin EC  81 mg Oral Daily   calcium carbonate  500 mg of elemental calcium Oral BID WC   cholecalciferol  2,000 Units Oral Daily   clopidogrel  75 mg Oral Daily   enoxaparin (LOVENOX) injection  40 mg Subcutaneous Q24H   feeding supplement  237 mL Oral QID   ferrous sulfate  325 mg Oral QODAY   furosemide  20 mg Oral Daily   gabapentin  800 mg Oral TID   latanoprost  1-2 drop Both Eyes QHS   losartan  25 mg Oral Daily   megestrol  400  mg Oral BID   mirtazapine  7.5 mg Oral QHS   multivitamin with minerals  1 tablet Oral Daily   nicotine  21 mg Transdermal Daily   pantoprazole  40 mg Oral Daily   silver sulfADIAZINE   Topical Daily   vancomycin  125 mg Oral QID   Continuous Infusions:   Anti-infectives (From admission, onward)    Start     Dose/Rate Route Frequency Ordered Stop   04/10/22 1530  vancomycin (VANCOCIN) capsule 125 mg        125 mg Oral 4 times daily 04/10/22 1437 04/20/22 1359   04/09/22 1730  vancomycin (VANCOREADY) IVPB 1250 mg/250 mL        1,250 mg 166.7 mL/hr over  90 Minutes Intravenous  Once 04/09/22 1715 04/09/22 1945   04/09/22 1715  ceFEPIme (MAXIPIME) 2 g in sodium chloride 0.9 % 100 mL IVPB        2 g 200 mL/hr over 30 Minutes Intravenous  Once 04/09/22 1715 04/09/22 1909              Family Communication/Anticipated D/C date and plan/Code Status   DVT prophylaxis: enoxaparin (LOVENOX) injection 40 mg Start: 04/10/22 0800     Code Status: DNR  Family Communication: None Disposition Plan: Plan to discharge to SNF   Status is: Inpatient Remains inpatient appropriate because: TOC working on safe disposition plan       Subjective:   He has no complaints.  He feels better.  Diarrhea is improving.  Objective:    Vitals:   04/17/22 2102 04/18/22 0500 04/18/22 0511 04/18/22 0845  BP: (!) 144/74  (!) 156/95 (!) 145/75  Pulse: (!) 110  (!) 102 99  Resp: '20  18 20  '$ Temp: 99 F (37.2 C)  97.7 F (36.5 C) 99.1 F (37.3 C)  TempSrc: Oral  Oral   SpO2: 95%  98% 96%  Weight:  48.3 kg    Height:       No data found.   Intake/Output Summary (Last 24 hours) at 04/18/2022 1258 Last data filed at 04/18/2022 0556 Gross per 24 hour  Intake --  Output 1100 ml  Net -1100 ml   Filed Weights   04/16/22 0500 04/17/22 0412 04/18/22 0500  Weight: 48.5 kg 46.2 kg 48.3 kg    Exam:  GEN: NAD SKIN: Bruises on bilateral upper extremities, chronic wounds on the right thigh EYES: EOMI ENT: MMM CV: RRR PULM: CTA B ABD: soft, ND, NT, +BS CNS: AAO x 3, non focal EXT: B/l leg edema is improving. No tenderness    Pressure Injury 04/09/22 Coccyx Stage 2 -  Partial thickness loss of dermis presenting as a shallow open injury with a red, pink wound bed without slough. (Active)  04/09/22 2015  Location: Coccyx  Location Orientation:   Staging: Stage 2 -  Partial thickness loss of dermis presenting as a shallow open injury with a red, pink wound bed without slough.  Wound Description (Comments):   Present on Admission: Yes   Dressing Type Foam - Lift dressing to assess site every shift 04/17/22 2040     Data Reviewed:   I have personally reviewed following labs and imaging studies:  Labs: Labs show the following:   Basic Metabolic Panel: Recent Labs  Lab 04/13/22 0420 04/16/22 0505  NA 136 133*  K 5.1 4.6  CL 106 95*  CO2 28 34*  GLUCOSE 147* 112*  BUN 18 21  CREATININE 0.69 0.72  CALCIUM 7.7*  8.5*  MG 1.9 2.0  PHOS  --  4.6   GFR Estimated Creatinine Clearance: 62.1 mL/min (by C-G formula based on SCr of 0.72 mg/dL). Liver Function Tests: No results for input(s): "AST", "ALT", "ALKPHOS", "BILITOT", "PROT", "ALBUMIN" in the last 168 hours.  No results for input(s): "LIPASE", "AMYLASE" in the last 168 hours. No results for input(s): "AMMONIA" in the last 168 hours.  Coagulation profile No results for input(s): "INR", "PROTIME" in the last 168 hours.  CBC: Recent Labs  Lab 04/13/22 0420 04/16/22 0505  WBC 9.8 12.6*  NEUTROABS  --  10.4*  HGB 8.7* 9.1*  HCT 26.8* 26.7*  MCV 93.7 89.0  PLT 437* 511*   Cardiac Enzymes: No results for input(s): "CKTOTAL", "CKMB", "CKMBINDEX", "TROPONINI" in the last 168 hours. BNP (last 3 results) No results for input(s): "PROBNP" in the last 8760 hours. CBG: Recent Labs  Lab 04/14/22 0819 04/14/22 1235 04/15/22 0759 04/16/22 0820 04/17/22 0818  GLUCAP 93 144* 100* 95 107*   D-Dimer: No results for input(s): "DDIMER" in the last 72 hours. Hgb A1c: No results for input(s): "HGBA1C" in the last 72 hours. Lipid Profile: No results for input(s): "CHOL", "HDL", "LDLCALC", "TRIG", "CHOLHDL", "LDLDIRECT" in the last 72 hours. Thyroid function studies: No results for input(s): "TSH", "T4TOTAL", "T3FREE", "THYROIDAB" in the last 72 hours.  Invalid input(s): "FREET3" Anemia work up: No results for input(s): "VITAMINB12", "FOLATE", "FERRITIN", "TIBC", "IRON", "RETICCTPCT" in the last 72 hours. Sepsis Labs: Recent Labs  Lab 04/13/22 0420  04/16/22 0505  WBC 9.8 12.6*    Microbiology Recent Results (from the past 240 hour(s))  Blood culture (routine x 2)     Status: None   Collection Time: 04/09/22  1:36 PM   Specimen: BLOOD  Result Value Ref Range Status   Specimen Description BLOOD RIGHT ANTECUBITAL  Final   Special Requests   Final    BOTTLES DRAWN AEROBIC AND ANAEROBIC Blood Culture adequate volume   Culture   Final    NO GROWTH 5 DAYS Performed at [facility], [address] [place], [place] [address]    Report Status 04/14/2022 FINAL  Final  Resp panel by RT-PCR (RSV, Flu A&B, Covid) Anterior Nasal Swab     Status: None   Collection Time: 04/09/22  3:28 PM   Specimen: Anterior Nasal Swab  Result Value Ref Range Status   SARS Coronavirus 2 by RT PCR NEGATIVE NEGATIVE Final    Comment: (NOTE) SARS-CoV-2 target nucleic acids are NOT DETECTED.  The SARS-CoV-2 RNA is generally detectable in upper respiratory specimens during the acute phase of infection. The lowest concentration of SARS-CoV-2 viral copies this assay can detect is 138 copies/mL. A negative result does not preclude SARS-Cov-2 infection and should not be used as the sole basis for treatment or other patient management decisions. A negative result may occur with  improper specimen collection/handling, submission of specimen other than nasopharyngeal swab, presence of viral mutation(s) within the areas targeted by this assay, and inadequate number of viral copies(<138 copies/mL). A negative result must be combined with clinical observations, patient history, and epidemiological information. The expected result is Negative.  Fact Sheet for Patients:  EntrepreneurPulse.com.au  Fact Sheet for Healthcare Providers:  IncredibleEmployment.be  This test is no t yet approved or cleared by the [place] FDA and  has been authorized for detection and/or diagnosis of SARS-CoV-2 by FDA under an  Emergency Use Authorization (EUA). This EUA will remain  in effect (meaning this test can  be used) for the duration of the COVID-19 declaration under Section 564(b)(1) of the Act, 21 U.S.C.section 360bbb-3(b)(1), unless the authorization is terminated  or revoked sooner.       Influenza A by PCR NEGATIVE NEGATIVE Final   Influenza B by PCR NEGATIVE NEGATIVE Final    Comment: (NOTE) The Xpert Xpress SARS-CoV-2/FLU/RSV plus assay is intended as an aid in the diagnosis of influenza from Nasopharyngeal swab specimens and should not be used as a sole basis for treatment. Nasal washings and aspirates are unacceptable for Xpert Xpress SARS-CoV-2/FLU/RSV testing.  Fact Sheet for Patients: EntrepreneurPulse.com.au  Fact Sheet for Healthcare Providers: IncredibleEmployment.be  This test is not yet approved or cleared by the [place] FDA and has been authorized for detection and/or diagnosis of SARS-CoV-2 by FDA under an Emergency Use Authorization (EUA). This EUA will remain in effect (meaning this test can be used) for the duration of the COVID-19 declaration under Section 564(b)(1) of the Act, 21 U.S.C. section 360bbb-3(b)(1), unless the authorization is terminated or revoked.     Resp Syncytial Virus by PCR NEGATIVE NEGATIVE Final    Comment: (NOTE) Fact Sheet for Patients: EntrepreneurPulse.com.au  Fact Sheet for Healthcare Providers: IncredibleEmployment.be  This test is not yet approved or cleared by the [place] FDA and has been authorized for detection and/or diagnosis of SARS-CoV-2 by FDA under an Emergency Use Authorization (EUA). This EUA will remain in effect (meaning this test can be used) for the duration of the COVID-19 declaration under Section 564(b)(1) of the Act, 21 U.S.C. section 360bbb-3(b)(1), unless the authorization is terminated or revoked.  Performed at [facility], [address] [place], [place] [address]   Blood culture (routine x 2)     Status: None   Collection Time: 04/09/22  8:40 PM   Specimen: Left Antecubital; Blood  Result Value Ref Range Status   Specimen Description LEFT ANTECUBITAL  Final   Special Requests   Final    BOTTLES DRAWN AEROBIC AND ANAEROBIC Blood Culture adequate volume   Culture   Final    NO GROWTH 5 DAYS Performed at [facility], [address] [place], [place] [address]    Report Status 04/14/2022 FINAL  Final  C Difficile Quick Screen w PCR reflex     Status: Abnormal   Collection Time: 04/10/22 12:02 PM   Specimen: STOOL  Result Value Ref Range Status   C Diff antigen POSITIVE (A) NEGATIVE Final   C Diff toxin NEGATIVE NEGATIVE Final   C Diff interpretation Results are indeterminate. See PCR results.  Final    Comment: Performed at [facility], [address] [place], [place] [address]  C. Diff by PCR, Reflexed     Status: Abnormal   Collection Time: 04/10/22 12:02 PM  Result Value Ref Range Status   Toxigenic C. Difficile by PCR POSITIVE (A) NEGATIVE Final    Comment: Positive for toxigenic C. difficile with little to no toxin production. Only treat if clinical presentation suggests symptomatic illness. Performed at [facility], [address] [place],  [address]     Procedures and diagnostic studies:  No results found.             LOS: 8 days   Dolora Ridgely  [facility]   Pager on [email or web address]. If 7PM-7AM, please contact night-coverage at [email or web address]     04/18/2022, 12:58 PM

## 2022-04-19 DIAGNOSIS — A0472 Enterocolitis due to Clostridium difficile, not specified as recurrent: Secondary | ICD-10-CM | POA: Diagnosis not present

## 2022-04-19 DIAGNOSIS — I679 Cerebrovascular disease, unspecified: Secondary | ICD-10-CM | POA: Diagnosis not present

## 2022-04-19 LAB — GLUCOSE, CAPILLARY: Glucose-Capillary: 108 mg/dL — ABNORMAL HIGH (ref 70–99)

## 2022-04-19 NOTE — Progress Notes (Addendum)
CSW updated pts wife on other bed offers in [place].  CSW asked her for her choice as soon as possible for discharge planning purposes.  She states that she will give CSW information by today.  Locations that offered bed:  [facility] and [facility]).  4:10ppm  Pt swife called bk and stated that she would like to accept bed offer at [facility].  CSW spoke with Lavella Lemons who confirmed that she will take pt at facility when medically ready.  Information relayed to pts wife.

## 2022-04-19 NOTE — Progress Notes (Signed)
Progress Note    Lucas Ramirez  XIP:[id number] DOB: [birth year]  DOA: 04/09/2022 PCP: Birdie Sons, MD      Brief Narrative:    Medical records reviewed and are as summarized below:  Lucas Ramirez is a 66 y.o. 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  He complained of inability to walk because of profound generalized weakness. .     Assessment/Plan:   Principal Problem:   Shortness of breath Active Problems:   HLD (hyperlipidemia)   HTN (hypertension)   Stroke (cerebrum) (HCC)   Cerebrovascular disease   Tobacco dependence   Cachexia (HCC)   Pressure injury of skin   Protein-calorie malnutrition, severe   Chronic diastolic CHF (congestive heart failure) (HCC)   C. difficile diarrhea   Nutrition Problem: Severe Malnutrition Etiology: chronic illness (COPD)  Signs/Symptoms: moderate fat depletion, severe fat depletion, moderate muscle depletion, severe muscle depletion   Body mass index is 16.66 kg/m.   Shortness of breath, likely multifactorial: Improved.  He is tolerating room air.  CTA negative for pulmonary embolism, no acute infection but there is evidence of chronic scarring with honeycombing.  Acute stroke/right infarcts in the right frontal lobe: Continue aspirin and Plavix.  LDL was 50.  C. difficile diarrhea: Diarrhea  has improved.  Plan to complete vancomycin on 04/20/2022.  C. difficile toxin was negative but toxigenic C. difficile PCR and C. difficile antigen were positive.    Hypertension, chronic diastolic CHF: Compensated.  Continue Lasix and losartan.  2D echo from 04/10/2022 showed EF estimated at 60 to 67%, grade 1 diastolic dysfunction  Failure to thrive, cachexia, multivitamin deficiency (B6, K1, D, E), iron deficiency: Continue Megace and Remeron.  Vitamin A, B1, B12, C levels were normal.  Continue ferrous sulfate and multivitamins  Follow-up with dietitian.  TOC/social worker to follow-up with DSS.  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  Patient is medically stable for discharge to SNF.  Updated daughter, Education officer, museum.  Awaiting insurance authorization.   Diet Order             DIET DYS 3 Room service appropriate? Yes with Assist; Fluid consistency: Thin  Diet effective now                            Consultants: Neurologist Palliative care  Procedures: None    Medications:     stroke: early stages of recovery book   Does not apply Once   (feeding supplement) PROSource Plus  30 mL Oral TID BM   aspirin EC  81 mg Oral  Daily   calcium carbonate  500 mg of elemental calcium Oral BID WC   cholecalciferol  2,000 Units Oral Daily   clopidogrel  75 mg Oral Daily   enoxaparin (LOVENOX) injection  40 mg Subcutaneous Q24H   feeding supplement  237 mL Oral QID   ferrous sulfate  325 mg Oral QODAY   furosemide  20 mg Oral Daily   gabapentin  800 mg Oral TID   latanoprost  1-2 drop Both Eyes QHS    losartan  25 mg Oral Daily   megestrol  400 mg Oral BID   mirtazapine  7.5 mg Oral QHS   multivitamin with minerals  1 tablet Oral Daily   nicotine  21 mg Transdermal Daily   pantoprazole  40 mg Oral Daily   silver sulfADIAZINE   Topical Daily   vancomycin  125 mg Oral QID   Continuous Infusions:   Anti-infectives (From admission, onward)    Start     Dose/Rate Route Frequency Ordered Stop   04/10/22 1530  vancomycin (VANCOCIN) capsule 125 mg        125 mg Oral 4 times daily 04/10/22 1437 04/20/22 1359   04/09/22 1730  vancomycin (VANCOREADY) IVPB 1250 mg/250 mL        1,250 mg 166.7 mL/hr over 90 Minutes Intravenous  Once 04/09/22 1715 04/09/22 1945   04/09/22 1715  ceFEPIme (MAXIPIME) 2 g in sodium chloride 0.9 % 100 mL IVPB        2 g 200 mL/hr over 30 Minutes Intravenous  Once 04/09/22 1715 04/09/22 1909              Family Communication/Anticipated D/C date and plan/Code Status   DVT prophylaxis: enoxaparin (LOVENOX) injection 40 mg Start: 04/10/22 0800     Code Status: DNR  Family Communication: None Disposition Plan: Plan to discharge to SNF   Status is: Inpatient Remains inpatient appropriate because: Awaiting placement to SNF       Subjective:   No complaints.  No diarrhea today.  Overall, he feels better.  Objective:    Vitals:   04/18/22 1723 04/18/22 2055 04/19/22 0450 04/19/22 0807  BP: 112/60 (!) 118/56 133/67 (!) 146/85  Pulse: (!) 101 (!) 103 99 94  Resp: '20 20 19 15  '$ Temp: 98.2 F (36.8 C) 97.8 F (36.6 C) 98.4 F (36.9 C) 98.6 F (37 C)  TempSrc:      SpO2: 96% 94% 92% 96%  Weight:      Height:       No data found.   Intake/Output Summary (Last 24 hours) at 04/19/2022 1628 Last data filed at 04/18/2022 2304 Gross per 24 hour  Intake --  Output 1500 ml  Net -1500 ml   Filed Weights   04/16/22 0500 04/17/22 0412 04/18/22 0500  Weight: 48.5 kg 46.2 kg 48.3 kg    Exam:  GEN: NAD SKIN: Warm and dry.  Chronic  wounds on right thigh. EYES: No pallor or icterus ENT: MMM CV: RRR PULM: CTA B ABD: soft, ND, NT, +BS CNS: AAO x 3, non focal EXT: Bilateral leg edema has improved, no tenderness    Pressure Injury 04/09/22 Coccyx Stage 2 -  Partial thickness loss of dermis presenting as a shallow open injury with a red, pink wound bed without slough. (Active)  04/09/22 2015  Location: Coccyx  Location Orientation:   Staging: Stage 2 -  Partial thickness loss of dermis presenting as a shallow open injury with a red, pink  wound bed without slough.  Wound Description (Comments):   Present on Admission: Yes  Dressing Type Foam - Lift dressing to assess site every shift 04/18/22 2051     Data Reviewed:   I have personally reviewed following labs and imaging studies:  Labs: Labs show the following:   Basic Metabolic Panel: Recent Labs  Lab 04/13/22 0420 04/16/22 0505  NA 136 133*  K 5.1 4.6  CL 106 95*  CO2 28 34*  GLUCOSE 147* 112*  BUN 18 21  CREATININE 0.69 0.72  CALCIUM 7.7* 8.5*  MG 1.9 2.0  PHOS  --  4.6   GFR Estimated Creatinine Clearance: 62.1 mL/min (by C-G formula based on SCr of 0.72 mg/dL). Liver Function Tests: No results for input(s): "AST", "ALT", "ALKPHOS", "BILITOT", "PROT", "ALBUMIN" in the last 168 hours.  No results for input(s): "LIPASE", "AMYLASE" in the last 168 hours. No results for input(s): "AMMONIA" in the last 168 hours.  Coagulation profile No results for input(s): "INR", "PROTIME" in the last 168 hours.  CBC: Recent Labs  Lab 04/13/22 0420 04/16/22 0505  WBC 9.8 12.6*  NEUTROABS  --  10.4*  HGB 8.7* 9.1*  HCT 26.8* 26.7*  MCV 93.7 89.0  PLT 437* 511*   Cardiac Enzymes: No results for input(s): "CKTOTAL", "CKMB", "CKMBINDEX", "TROPONINI" in the last 168 hours. BNP (last 3 results) No results for input(s): "PROBNP" in the last 8760 hours. CBG: Recent Labs  Lab 04/14/22 1235 04/15/22 0759 04/16/22 0820 04/17/22 0818 04/19/22 0808   GLUCAP 144* 100* 95 107* 108*   D-Dimer: No results for input(s): "DDIMER" in the last 72 hours. Hgb A1c: No results for input(s): "HGBA1C" in the last 72 hours. Lipid Profile: No results for input(s): "CHOL", "HDL", "LDLCALC", "TRIG", "CHOLHDL", "LDLDIRECT" in the last 72 hours. Thyroid function studies: No results for input(s): "TSH", "T4TOTAL", "T3FREE", "THYROIDAB" in the last 72 hours.  Invalid input(s): "FREET3" Anemia work up: No results for input(s): "VITAMINB12", "FOLATE", "FERRITIN", "TIBC", "IRON", "RETICCTPCT" in the last 72 hours. Sepsis Labs: Recent Labs  Lab 04/13/22 0420 04/16/22 0505  WBC 9.8 12.6*    Microbiology Recent Results (from the past 240 hour(s))  Blood culture (routine x 2)     Status: None   Collection Time: 04/09/22  8:40 PM   Specimen: Left Antecubital; Blood  Result Value Ref Range Status   Specimen Description LEFT ANTECUBITAL  Final   Special Requests   Final    BOTTLES DRAWN AEROBIC AND ANAEROBIC Blood Culture adequate volume   Culture   Final    NO GROWTH 5 DAYS Performed at [facility], [address], [place], [place] [address]    Report Status 04/14/2022 FINAL  Final  C Difficile Quick Screen w PCR reflex     Status: Abnormal   Collection Time: 04/10/22 12:02 PM   Specimen: STOOL  Result Value Ref Range Status   C Diff antigen POSITIVE (A) NEGATIVE Final   C Diff toxin NEGATIVE NEGATIVE Final   C Diff interpretation Results are indeterminate. See PCR results.  Final    Comment: Performed at [facility], [address] [place], [place] [address]  C. Diff by PCR, Reflexed     Status: Abnormal   Collection Time: 04/10/22 12:02 PM  Result Value Ref Range Status   Toxigenic C. Difficile by PCR POSITIVE (A) NEGATIVE Final    Comment: Positive for toxigenic C. difficile with little to no toxin production. Only treat if clinical presentation suggests symptomatic  illness. Performed at [facility], [address] [place], [place] [address]     Procedures and diagnostic studies:  No results found.             LOS: 9 days   Zadie Deemer  [employer], advertising on [email or web address]. If 7PM-7AM, please contact night-coverage at [email or web address]     04/19/2022, 4:28 PM

## 2022-04-20 DIAGNOSIS — A0472 Enterocolitis due to Clostridium difficile, not specified as recurrent: Secondary | ICD-10-CM | POA: Diagnosis not present

## 2022-04-20 DIAGNOSIS — I679 Cerebrovascular disease, unspecified: Secondary | ICD-10-CM | POA: Diagnosis not present

## 2022-04-20 LAB — CBC
HCT: 26.4 % — ABNORMAL LOW (ref 39.0–52.0)
Hemoglobin: 8.7 g/dL — ABNORMAL LOW (ref 13.0–17.0)
MCH: 30.1 pg (ref 26.0–34.0)
MCHC: 33 g/dL (ref 30.0–36.0)
MCV: 91.3 fL (ref 80.0–100.0)
Platelets: 669 10*3/uL — ABNORMAL HIGH (ref 150–400)
RBC: 2.89 MIL/uL — ABNORMAL LOW (ref 4.22–5.81)
RDW: 15.8 % — ABNORMAL HIGH (ref 11.5–15.5)
WBC: 15.1 10*3/uL — ABNORMAL HIGH (ref 4.0–10.5)
nRBC: 0 % (ref 0.0–0.2)

## 2022-04-20 NOTE — Progress Notes (Signed)
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 Pt is greeted in room, oriented to self, place, grossly to situation, reports he wants to go to rehab. Improvements noted in cognition and direction following however pt continues to present with deficits. Tx session targeted improving functional activity tolerance in preparation for ADL tasks. Pt is making slow progress towards goals, slightly increased assist required for STS, MOD A; and Spt to bedside chair with MIN -MOD A with RW. Grooming tasks completed with MIN A. Pt is left in bedside chair, all needs met. Nurse aware of pt status. Discharge recommendation remains appropriate.    Recommendations for follow up therapy are one component of a multi-disciplinary discharge planning process, led by the attending physician.  Recommendations may be updated based on patient status, additional functional criteria and insurance authorization.    Follow Up Recommendations  Skilled nursing-short term rehab (<3 hours/day)     Assistance Recommended at Discharge Frequent or constant Supervision/Assistance  Patient can return home with the following  A lot of help with walking and/or transfers;A lot of help with bathing/dressing/bathroom   Equipment Recommendations  Other (comment) (per next venue of care)    Recommendations for Other Services      Precautions /  Restrictions Precautions Precautions: Fall Restrictions Weight Bearing Restrictions: No       Mobility Bed Mobility Overal bed mobility: Needs Assistance Bed Mobility: Supine to Sit     Supine to sit: Min assist, Mod assist     General bed mobility comments: step by step vcs for sequencing    Transfers Overall transfer level: Needs assistance Equipment used: Rolling walker (2 wheels) Transfers: Sit to/from Stand Sit to Stand: Mod assist           General transfer comment: step by step vcs for sequencing     Balance Overall balance assessment: Needs assistance Sitting-balance support: Feet supported Sitting balance-Leahy Scale: Fair     Standing balance support: Bilateral upper extremity supported, During functional activity, Reliant on assistive device for balance Standing balance-Leahy Scale: Poor                             ADL either performed or assessed with clinical judgement   ADL Overall ADL's : Needs assistance/impaired Eating/Feeding: Set up;Sitting                   Lower Body Dressing: Maximal assistance Lower Body Dressing Details (indicate cue type and reason): socks Toilet Transfer: Minimal assistance;Moderate assistance;Rolling walker (2 wheels);Stand-pivot Armed forces technical officer Details (indicate cue type and reason): simulated from [employer]                Extremity/Trunk Assessment              Vision       Perception     Praxis      Cognition Arousal/Alertness: Awake/alert Behavior During Therapy:  Flat affect Overall Cognitive Status: No family/caregiver present to determine baseline cognitive functioning Area of Impairment: Attention, Memory, Following commands, Safety/judgement, Awareness, Problem solving, Orientation                 Orientation Level: Disoriented to, Time Current Attention Level: Sustained Memory: Decreased short-term memory Following Commands: Follows one step commands with  increased time Safety/Judgement: Decreased awareness of safety, Decreased awareness of deficits Awareness: Intellectual Problem Solving: Decreased initiation, Slow processing, Difficulty sequencing, Requires verbal cues, Requires tactile cues          Exercises      Shoulder Instructions       General Comments spo2 >90% on 2 L via [place], HR up to 120s with mobility    Pertinent Vitals/ Pain       Pain Assessment Pain Assessment: 0-10 Pain Score: 7  Pain Location: back, legs Pain Descriptors / Indicators: Discomfort, Aching Pain Intervention(s): Monitored during session, Repositioned  Home Living                                          Prior Functioning/Environment              Frequency  Min 2X/week        Progress Toward Goals  OT Goals(current goals can now be found in the care plan section)  Progress towards OT goals: Progressing toward goals     Plan Discharge plan remains appropriate    Co-evaluation                 AM-PAC OT "6 Clicks" Daily Activity     Outcome Measure   Help from another person eating meals?: None Help from another person taking care of personal grooming?: A Little Help from another person toileting, which includes using toliet, bedpan, or urinal?: A Lot Help from another person bathing (including washing, rinsing, drying)?: A Lot Help from another person to put on and taking off regular upper body clothing?: A Little Help from another person to put on and taking off regular lower body clothing?: A Lot 6 Click Score: 16    End of Session Equipment Utilized During Treatment: Rolling walker (2 wheels);Oxygen  OT Visit Diagnosis: Unsteadiness on feet (R26.81);Muscle weakness (generalized) (M62.81)   Activity Tolerance Patient tolerated treatment well   Patient Left in chair;with call bell/phone within reach;with chair alarm set   Nurse Communication Mobility status        Time: 1012-1030 OT Time  Calculation (min): 18 min  Charges: OT General Charges $OT Visit: 1 Visit OT Treatments $Therapeutic Activity: 8-22 mins  Shanon Payor, OTD OTR/L  04/20/22, 11:09 AM

## 2022-04-20 NOTE — Progress Notes (Signed)
Nutrition Follow-up  DOCUMENTATION CODES:   Severe malnutrition in context of chronic illness  INTERVENTION:   -Continue 30 ml Prosource Plus TID, each supplement provides 100 kcals and 15 grams protein -Continue Ensure Enlive po QID, each supplement provides 350 kcal and 20 grams of protein.   NUTRITION DIAGNOSIS:   Severe Malnutrition related to chronic illness (COPD) as evidenced by moderate fat depletion, severe fat depletion, moderate muscle depletion, severe muscle depletion.  Ongoing  GOAL:   Patient will meet greater than or equal to 90% of their needs  Progressing   MONITOR:   PO intake, Supplement acceptance, Labs, Weight trends, Skin, I & O's  REASON FOR ASSESSMENT:   Malnutrition Screening Tool, Consult Assessment of nutrition requirement/status  ASSESSMENT:   66 y.o. male with medical history of COPD, chronic smoker, chronic back pain on oxycodone, Bell's palsy, HTN, HLD, GERD, poor health maintenance, chronic BLE swelling, cachexia of unknown cause and extensively investigated with his primary care physician's office, and stroke currently on Plavix. He presented to the ED due to confusion, lethargy, extreme weakness, and shortness of breath for >1 month that worsened in the past few days. He reported being unable to walk due to weakness. Patient continues to smoke while laying in his bed and falls asleep while smoking; wakes up with burns on his skin. He has been trialed on megace and Remeron for appetite stimulation in the past but these were ineffective. Patient reports ongoing poor appetite and that he has not eaten a full meal for ~1 month.  12/18- s/p BSE- dysphagia 3 diet with thin liquids   Reviewed I/O's: -700 ml x 24 hours and -10.4 L since admission  UOP: 700 ml x 24 hours   Pt sleeping soundly at time of visit. RD did not disturb.   Pt with variable oral intake. Noted meal completions 5-60%. Pt compliant with drinking ordered supplements.   Per  TOC notes, plan for SNF at discharge.   Medications reviewed and include calcium carbonate, vitamin D3, ferrous sulfate, lasix, and megace.  Labs reviewed: Na: 133, CBGS: 95-108 (inpatient orders for glycemic control are none).    Diet Order:   Diet Order             DIET DYS 3 Room service appropriate? Yes with Assist; Fluid consistency: Thin  Diet effective now                   EDUCATION NEEDS:   Not appropriate for education at this time  Skin:  Skin Assessment: Skin Integrity Issues: Skin Integrity Issues:: Stage II Stage II: coccyx  Last BM:  04/19/22 (type 5)  Height:   Ht Readings from Last 1 Encounters:  04/09/22 '5\' 7"'$  (1.702 m)    Weight:   Wt Readings from Last 1 Encounters:  04/20/22 45.1 kg    Ideal Body Weight:  67.3 kg  BMI:  Body mass index is 15.58 kg/m.  Estimated Nutritional Needs:   Kcal:  2000-2200  Protein:  100-115 grams  Fluid:  > 2 L    Loistine Chance, RD, LDN, [place] Registered Dietitian II Certified Diabetes Care and Education Specialist Please refer to [facility] for RD and/or RD on-call/weekend/after hours pager

## 2022-04-20 NOTE — Progress Notes (Signed)
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 Pt required Mod/MaxA to complete sit to stand and squat pivot to edge of bed. Pt too fatigued to utilize RW and follow commands for use this visit.  Attempted AROM B LE in supine with minimal participation. Pt positioned to comfort, bed alarm on, call bell in reach. Continue to recommend SNF placement once medically cleared for d/c.   Recommendations for follow up therapy are one component of a multi-disciplinary discharge planning process, led by the attending physician.  Recommendations may be updated based on patient status, additional functional criteria and insurance authorization.  Follow Up Recommendations  Skilled nursing-short term rehab (<3 hours/day) Can patient physically be transported by private vehicle: No   Assistance Recommended at Discharge Frequent or constant Supervision/Assistance  Patient can return home with the following A lot of help with walking and/or transfers;A lot of help with bathing/dressing/bathroom;Assistance with cooking/housework;Assistance with feeding;Direct supervision/assist for medications management;Direct supervision/assist for financial management;Assist for transportation;Help with stairs or ramp for entrance   Equipment Recommendations  Other (comment) (defer to next level of care)    Recommendations  for Other Services       Precautions / Restrictions Precautions Precautions: Fall Restrictions Weight Bearing Restrictions: No     Mobility  Bed Mobility Overal bed mobility: Needs Assistance Bed Mobility: Sit to Supine       Sit to supine: Mod assist   General bed mobility comments: Pt very lethargic after sitting up in chair for a few hours    Transfers Overall transfer level: Needs assistance Equipment used: None Transfers: Sit to/from Stand Sit to Stand: Max assist     Squat pivot transfers: Mod assist, Max assist     General transfer comment:  (Pt too fatigued requiring increased assist for transfer back to bed.)    Ambulation/Gait               General Gait Details: pt unable. per pt/pt's spouse, has been unable to ambulate for several weeks. Has been using BSC at home.   Stairs             Wheelchair Mobility    Modified Rankin (Stroke Patients Only)       Balance                                            Cognition Arousal/Alertness: Lethargic Behavior During Therapy: Flat affect Overall Cognitive Status: No family/caregiver present to determine baseline cognitive functioning Area of Impairment: Attention, Memory, Following commands, Safety/judgement, Awareness, Problem solving, Orientation                 Orientation Level: Disoriented to, Time Current Attention Level: Alternating     Safety/Judgement: Decreased awareness of safety, Decreased awareness of deficits Awareness: Intellectual Problem Solving: Decreased initiation, Slow processing,  Difficulty sequencing, Requires verbal cues, Requires tactile cues          Exercises General Exercises - Lower Extremity Ankle Circles/Pumps: PROM, AAROM, Both, 10 reps Heel Slides: PROM, AAROM, Both, 5 reps Hip ABduction/ADduction: PROM, AAROM, Both, 5 reps Other Exercises Other Exercises: Pt required assistance for ROM due to falling asleep during  reps.    General Comments General comments (skin integrity, edema, etc.):  (spo2 at 95%, HR 108-112bpm)      Pertinent Vitals/Pain Pain Assessment Pain Assessment: 0-10 Pain Score: 5  Pain Location: back, legs Pain Descriptors / Indicators: Discomfort, Aching Pain Intervention(s): Monitored during session    Home Living                          Prior Function            PT Goals (current goals can now be found in the care plan section) Acute Rehab PT Goals Patient Stated Goal: none stated    Frequency    Min 2X/week      PT Plan Current plan remains appropriate    Co-evaluation              AM-PAC PT "6 Clicks" Mobility   Outcome Measure  Help needed turning from your back to your side while in a flat bed without using bedrails?: A Lot Help needed moving from lying on your back to sitting on the side of a flat bed without using bedrails?: A Lot Help needed moving to and from a bed to a chair (including a wheelchair)?: A Lot Help needed standing up from a chair using your arms (e.g., wheelchair or bedside chair)?: A Lot Help needed to walk in hospital room?: Total Help needed climbing 3-5 steps with a railing? : Total 6 Click Score: 10    End of Session Equipment Utilized During Treatment: Oxygen Activity Tolerance: Patient limited by fatigue Patient left: in bed;with call bell/phone within reach;with bed alarm set Nurse Communication: Mobility status PT Visit Diagnosis: Unsteadiness on feet (R26.81);Other abnormalities of gait and mobility (R26.89);Muscle weakness (generalized) (M62.81)     Time: 5993-5701 PT Time Calculation (min) (ACUTE ONLY): 26 min  Charges:  $Therapeutic Exercise: 8-22 mins $Therapeutic Activity: 8-22 mins                    Mikel Cella, PTA    Josie Dixon 04/20/2022, 12:46 PM

## 2022-04-20 NOTE — Progress Notes (Signed)
Speech Language Pathology Treatment: Dysphagia  Patient Details Name: Lucas Ramirez MRN: [id number] DOB: 05/19/1955 Today's Date: 04/20/2022 Time: 1620-1640 SLP Time Calculation (min) (ACUTE ONLY): 20 min  Assessment / Plan / Recommendation Clinical Impression  Pt seen for follow up dysphagia intervention. Chart review completed- pt remains afebrile and no new imaging appreciated. Congested cough noted in the absence and presence of PO intake- never timed with swallow completion. Pt seen with trials of thin liquids, puree, and regular solids. Oral phase was complete and efficient for manipulation of regular solids. No oral residue observed. Pt independently maintained slow rate and small sips for trials. Pt reported consuming snacks of regular solids (present at patient's bedside) without issue. Education shared regarding recommendations for regular solids and thin liquid diet.   Given pt's progress with diet and compliance with aspiration precautions, no further dysphagia intervention indicated. Recommend further cognitive communication assessment at next venue of care in the setting of decline in functioning w' ADLs in the home.     HPI HPI: Per H&P "Lucas Ramirez is a 66 y.o. male with medical history significant of COPD and chronic smoker, chronic back pain on oxycodone, Bell's palsy, hypertension, hyperlipidemia, GERD, poor health maintenance, cachexia of unknown cause and extensively investigated with his primary care physician's office, history of stroke currently on Plavix brought from home with confusion, lethargy, shortness of breath ongoing for more than a month and worse for the last few days.  Patient tells me he does have shortness of breath but that has been chronic.  Patient tells me that he cannot walk these days because of extreme weakness.  He continues to smoke laying in the bed, falls asleep with smoking and burns his skin.  Denies any alcohol or drug use.  His symptoms are  gradually worsening over the last 2 years but recently has gotten very bad.  Reviewed his outpatient records, he had been investigated and recently started on Megace and Remeron with no effects.  Appetite is poor and has really not eaten good portion of meal for almost a month.  Anorexia, progressive debility, unable to move around, chronic swelling of both legs.  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 No  hemorrhage or mass effect."      SLP Plan  All goals met (pt to benefit from further cognitive assessment at next level of care)      Recommendations for follow up therapy are one component of a multi-disciplinary discharge planning process, led by the attending physician.  Recommendations may be updated based on patient status, additional functional criteria and insurance authorization.    Recommendations  Diet recommendations: Regular;Thin liquid Liquids provided via: Straw;Cup Medication Administration: Whole meds with liquid Supervision: Patient able to self feed Compensations: Minimize environmental distractions;Slow rate;Small sips/bites Postural Changes and/or Swallow Maneuvers: Out of bed for meals;Seated upright 90 degrees;Upright 30-60 min after meal                Oral Care Recommendations: Oral care QID;Patient independent with oral care Follow Up Recommendations: Skilled nursing-short term rehab (<3 hours/day) Assistance recommended at discharge: Frequent or constant Supervision/Assistance SLP Visit Diagnosis: Dysphagia, oral phase (R13.11);Cognitive communication deficit (R41.841) Plan: All goals met (pt to benefit from further cognitive assessment at next level of care)         Jordan Jarrett Clapp  MS CCC SLP   Jordan J Clapp  04/20/2022, 5:42 PM 

## 2022-04-20 NOTE — Progress Notes (Signed)
Progress Note    Lucas Ramirez DOB: 1955/05/08  DOA: 04/09/2022 PCP: Birdie Sons, MD      Brief Narrative:    Medical records reviewed and are as summarized below:  Lucas Ramirez is a 66 y.o. 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  He complained of inability to walk because of profound generalized weakness. .     Assessment/Plan:   Principal Problem:   Shortness of breath Active Problems:   HLD (hyperlipidemia)   HTN (hypertension)   Stroke (cerebrum) (HCC)   Cerebrovascular disease   Tobacco dependence   Cachexia (HCC)   Pressure injury of skin   Protein-calorie malnutrition, severe   Chronic diastolic CHF (congestive heart failure) (HCC)   C. difficile diarrhea   Nutrition Problem: Severe Malnutrition Etiology: chronic illness (COPD)  Signs/Symptoms: moderate fat depletion, severe fat depletion, moderate muscle depletion, severe muscle depletion   Body mass index is 15.58 kg/m.   Shortness of breath, likely multifactorial: Improved.  He is tolerating room air.  CTA negative for pulmonary embolism, no acute infection but there is evidence of chronic scarring with honeycombing.  Acute stroke/right infarcts in the right frontal lobe: Continue aspirin and Plavix.  LDL was 50.  C. difficile diarrhea: Diarrhea  has improved.  Completed 10-day course of oral vancomycin on 04/20/2022.  C. difficile toxin was negative but toxigenic C. difficile PCR and C. difficile antigen were positive.    Hypertension, chronic diastolic CHF: Compensated.  Peripheral edema has improved.  Continue Lasix and losartan.  2D echo from 04/10/2022 showed EF estimated at 60 to 25%, grade 1 diastolic dysfunction  Failure to thrive, cachexia, multivitamin deficiency (B6, K1, D, E), iron deficiency: Continue Megace and Remeron.  Vitamin A, B1, B12, C levels were normal.  Continue ferrous sulfate and multivitamins  Follow-up with dietitian.  TOC/social worker to follow-up with DSS.  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 Yes with Assist; Fluid consistency: Thin  Diet effective now                            Consultants: Neurologist Palliative care  Procedures: None    Medications:     stroke: early stages of recovery book   Does not apply Once   (feeding supplement) PROSource Plus  30 mL Oral TID BM   aspirin EC  81 mg Oral Daily   calcium carbonate  500 mg of elemental calcium Oral BID WC   cholecalciferol  2,000 Units Oral Daily   clopidogrel  75 mg Oral Daily   enoxaparin (LOVENOX) injection  40 mg Subcutaneous Q24H   feeding supplement  237 mL Oral QID   ferrous sulfate  325 mg Oral QODAY   furosemide  20 mg Oral Daily   gabapentin  800 mg Oral TID   latanoprost  1-2 drop Both Eyes QHS   losartan  25 mg Oral Daily   megestrol  400 mg Oral BID   mirtazapine  7.5 mg Oral QHS   multivitamin with minerals  1  tablet Oral Daily   nicotine  21 mg Transdermal Daily   pantoprazole  40 mg Oral Daily   silver sulfADIAZINE   Topical Daily   Continuous Infusions:   Anti-infectives (From admission, onward)    Start     Dose/Rate Route Frequency Ordered Stop   04/10/22 1530  vancomycin (VANCOCIN) capsule 125 mg        125 mg Oral 4 times daily 04/10/22 1437 04/20/22 0842   04/09/22 1730  vancomycin (VANCOREADY) IVPB 1250 mg/250 mL        1,250 mg 166.7 mL/hr over 90 Minutes Intravenous  Once 04/09/22 1715 04/09/22 1945   04/09/22 1715  ceFEPIme (MAXIPIME) 2 g in sodium chloride 0.9 % 100 mL IVPB        2 g 200 mL/hr over 30 Minutes Intravenous  Once 04/09/22 1715 04/09/22 1909              Family Communication/Anticipated D/C date and plan/Code Status   DVT prophylaxis: enoxaparin (LOVENOX) injection 40 mg Start: 04/10/22 0800     Code Status: DNR  Family Communication: None Disposition Plan: Plan to discharge to SNF   Status is: Inpatient Remains inpatient appropriate because: Awaiting placement to SNF       Subjective:   Interval events noted.  Diarrhea has improved.  No shortness of breath or chest pain.  He has no complaints.  Objective:    Vitals:   04/20/22 0543 04/20/22 0831 04/20/22 1458 04/20/22 1616  BP: (!) 149/68 125/71  129/68  Pulse: (!) 101 (!) 104  100  Resp: '18 15  15  '$ Temp: 98.4 F (36.9 C) 97.8 F (36.6 C)  97.8 F (36.6 C)  TempSrc: Oral     SpO2: 95% 94% 96% 97%  Weight:      Height:       No data found.   Intake/Output Summary (Last 24 hours) at 04/20/2022 1652 Last data filed at 04/19/2022 1839 Gross per 24 hour  Intake --  Output 700 ml  Net -700 ml   Filed Weights   04/17/22 0412 04/18/22 0500 04/20/22 0311  Weight: 46.2 kg 48.3 kg 45.1 kg    Exam:  GEN: NAD SKIN: Warm and dry.  Chronic wounds on the right thigh EYES: No pallor or icterus ENT: MMM CV: RRR PULM: CTA B ABD: soft, ND, NT, +BS CNS: AAO x 3, non  focal EXT: No edema or tenderness     Pressure Injury 04/09/22 Coccyx Stage 2 -  Partial thickness loss of dermis presenting as a shallow open injury with a red, pink wound bed without slough. (Active)  04/09/22 2015  Location: Coccyx  Location Orientation:   Staging: Stage 2 -  Partial thickness loss of dermis presenting as a shallow open injury with a red, pink wound bed without slough.  Wound Description (Comments):   Present on Admission: Yes  Dressing Type Foam - Lift dressing to  assess site every shift 04/19/22 2019     Data Reviewed:   I have personally reviewed following labs and imaging studies:  Labs: Labs show the following:   Basic Metabolic Panel: Recent Labs  Lab 04/16/22 0505  NA 133*  K 4.6  CL 95*  CO2 34*  GLUCOSE 112*  BUN 21  CREATININE 0.72  CALCIUM 8.5*  MG 2.0  PHOS 4.6   GFR Estimated Creatinine Clearance: 57.9 mL/min (by C-G formula based on SCr of 0.72 mg/dL). Liver Function Tests: No results for input(s): "AST", "ALT", "ALKPHOS", "BILITOT", "PROT", "ALBUMIN" in the last 168 hours.  No results for input(s): "LIPASE", "AMYLASE" in the last 168 hours. No results for input(s): "AMMONIA" in the last 168 hours.  Coagulation profile No results for input(s): "INR", "PROTIME" in the last 168 hours.  CBC: Recent Labs  Lab 04/16/22 0505 04/20/22 0630  WBC 12.6* 15.1*  NEUTROABS 10.4*  --   HGB 9.1* 8.7*  HCT 26.7* 26.4*  MCV 89.0 91.3  PLT 511* 669*   Cardiac Enzymes: No results for input(s): "CKTOTAL", "CKMB", "CKMBINDEX", "TROPONINI" in the last 168 hours. BNP (last 3 results) No results for input(s): "PROBNP" in the last 8760 hours. CBG: Recent Labs  Lab 04/14/22 1235 04/15/22 0759 04/16/22 0820 04/17/22 0818 04/19/22 0808  GLUCAP 144* 100* 95 107* 108*   D-Dimer: No results for input(s): "DDIMER" in the last 72 hours. Hgb A1c: No results for input(s): "HGBA1C" in the last 72 hours. Lipid Profile: No results for  input(s): "CHOL", "HDL", "LDLCALC", "TRIG", "CHOLHDL", "LDLDIRECT" in the last 72 hours. Thyroid function studies: No results for input(s): "TSH", "T4TOTAL", "T3FREE", "THYROIDAB" in the last 72 hours.  Invalid input(s): "FREET3" Anemia work up: No results for input(s): "VITAMINB12", "FOLATE", "FERRITIN", "TIBC", "IRON", "RETICCTPCT" in the last 72 hours. Sepsis Labs: Recent Labs  Lab 04/16/22 0505 04/20/22 0630  WBC 12.6* 15.1*    Microbiology No results found for this or any previous visit (from the past 240 hour(s)).   Procedures and diagnostic studies:  No results found.             LOS: 10 days   Cassadee Vanzandt  [facility]   Pager on [email or web address]. If 7PM-7AM, please contact night-coverage at [email or web address]     04/20/2022, 4:52 PM

## 2022-04-20 NOTE — Care Management Important Message (Signed)
Important Message  Patient Details  Name: Lucas Ramirez MRN: [id number] Date of Birth: [birth year]   Medicare Important Message Given:  Yes     Juliann Pulse A Marrian Bells 04/20/2022, 2:44 PM

## 2022-04-20 NOTE — Plan of Care (Signed)
PMT has been shadowing. Per notes, patient is now ready for D/C to SNF. PMT will sign off. Please let us know if needs arise.

## 2022-04-21 DIAGNOSIS — I5032 Chronic diastolic (congestive) heart failure: Secondary | ICD-10-CM | POA: Diagnosis not present

## 2022-04-21 DIAGNOSIS — E43 Unspecified severe protein-calorie malnutrition: Secondary | ICD-10-CM | POA: Diagnosis not present

## 2022-04-21 DIAGNOSIS — J849 Interstitial pulmonary disease, unspecified: Secondary | ICD-10-CM

## 2022-04-21 DIAGNOSIS — I679 Cerebrovascular disease, unspecified: Secondary | ICD-10-CM | POA: Diagnosis not present

## 2022-04-21 LAB — CBC WITH DIFFERENTIAL/PLATELET
Abs Immature Granulocytes: 0.1 10*3/uL — ABNORMAL HIGH (ref 0.00–0.07)
Basophils Absolute: 0 10*3/uL (ref 0.0–0.1)
Basophils Relative: 0 %
Eosinophils Absolute: 0.1 10*3/uL (ref 0.0–0.5)
Eosinophils Relative: 1 %
HCT: 28 % — ABNORMAL LOW (ref 39.0–52.0)
Hemoglobin: 9.2 g/dL — ABNORMAL LOW (ref 13.0–17.0)
Immature Granulocytes: 1 %
Lymphocytes Relative: 12 %
Lymphs Abs: 1.5 10*3/uL (ref 0.7–4.0)
MCH: 29.6 pg (ref 26.0–34.0)
MCHC: 32.9 g/dL (ref 30.0–36.0)
MCV: 90 fL (ref 80.0–100.0)
Monocytes Absolute: 1.1 10*3/uL — ABNORMAL HIGH (ref 0.1–1.0)
Monocytes Relative: 9 %
Neutro Abs: 10 10*3/uL — ABNORMAL HIGH (ref 1.7–7.7)
Neutrophils Relative %: 77 %
Platelets: 706 10*3/uL — ABNORMAL HIGH (ref 150–400)
RBC: 3.11 MIL/uL — ABNORMAL LOW (ref 4.22–5.81)
RDW: 15.6 % — ABNORMAL HIGH (ref 11.5–15.5)
WBC: 12.8 10*3/uL — ABNORMAL HIGH (ref 4.0–10.5)
nRBC: 0 % (ref 0.0–0.2)

## 2022-04-21 LAB — BASIC METABOLIC PANEL
Anion gap: 8 (ref 5–15)
BUN: 30 mg/dL — ABNORMAL HIGH (ref 8–23)
CO2: 28 mmol/L (ref 22–32)
Calcium: 8 mg/dL — ABNORMAL LOW (ref 8.9–10.3)
Chloride: 97 mmol/L — ABNORMAL LOW (ref 98–111)
Creatinine, Ser: 0.8 mg/dL (ref 0.61–1.24)
GFR, Estimated: >60 mL/min (ref >60)
Glucose, Bld: 151 mg/dL — ABNORMAL HIGH (ref 70–99)
Potassium: 4.6 mmol/L (ref 3.5–5.1)
Sodium: 133 mmol/L — ABNORMAL LOW (ref 135–145)

## 2022-04-21 LAB — GLUCOSE, CAPILLARY
Glucose-Capillary: 132 mg/dL — ABNORMAL HIGH (ref 70–99)
Glucose-Capillary: 149 mg/dL — ABNORMAL HIGH (ref 70–99)

## 2022-04-21 MED ORDER — SILVER SULFADIAZINE 1 % EX CREA: TOPICAL_CREAM | Freq: Every day | CUTANEOUS | 0 refills | Status: AC

## 2022-04-21 MED ORDER — ASPIRIN 81 MG PO TBEC: 81.0000 mg | DELAYED_RELEASE_TABLET | Freq: Every day | ORAL | Status: DC

## 2022-04-21 MED ORDER — FUROSEMIDE 20 MG PO TABS: 20.0000 mg | ORAL_TABLET | Freq: Every day | ORAL | 3 refills | Status: DC

## 2022-04-21 MED ORDER — FERROUS SULFATE 325 (65 FE) MG PO TABS: 325.0000 mg | ORAL_TABLET | ORAL | 0 refills | Status: DC

## 2022-04-21 NOTE — Discharge Summary (Signed)
Physician Discharge Summary   Patient: Lucas Ramirez MRN: [id number] DOB: [birth year]  Admit date:     04/09/2022  Discharge date: 04/21/2022  Discharge Physician: Jennye Boroughs   PCP: Birdie Sons, MD   Recommendations at discharge:    Follow up with PCP in 1 to 2 weeks  Discharge Diagnoses: Principal Problem:   Shortness of breath Active Problems:   HLD (hyperlipidemia)   HTN (hypertension)   Stroke (cerebrum) (HCC)   Cerebrovascular disease   Tobacco dependence   Cachexia (HCC)   Pressure injury of skin   Protein-calorie malnutrition, severe   Chronic diastolic CHF (congestive heart failure) (HCC)   C. difficile diarrhea   Interstitial lung disease ([place])  Resolved Problems:   * No resolved hospital problems. *  Hospital Course:  Lucas Ramirez is a 66 y.o. 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  He complained of inability to walk because of profound generalized weakness.  He also has scoliosis and chronic back pain with radiculopathy that has made walking difficult.  His appetite had been poor and had been started on Megace and Remeron because of poor oral intake although these have not been effective.  He was found to have acute stroke. He was continued on Aspirin and Plavix. He had C. difficile diarrhea that was treated with oral  vancomycin.   PT and OT had recommended discharge to SNF.  However, because of difficulty with placement/finding a skilled nursing facility, his wife decided that she would take him home.  She understands that patient is at high risk for falls.     Assessment and Plan:   Shortness of breath, likely multifactorial, interstitial lung disease on CT chest: He is tolerating room air.  CTA negative for pulmonary embolism, no acute infection but there is evidence of chronic scarring with honeycombing.   Acute stroke/right infarcts in the right frontal lobe: Continue aspirin and Plavix.  LDL was 50.   C. difficile diarrhea: Diarrhea has improved.  Completed 10-day course of oral vancomycin on 04/20/2022.  C. difficile toxin was negative but toxigenic C. difficile PCR and C. difficile antigen were positive.     Hypertension, chronic diastolic CHF: Compensated.  Peripheral edema has improved.  Continue Lasix and losartan.  2D echo from 04/10/2022 showed EF estimated at 60 to 69%, grade 1 diastolic dysfunction   Failure to thrive, cachexia, multivitamin deficiency (B6, K1, D, E), iron deficiency: Continue Megace and Remeron.  Vitamin A, B1, B12, C levels were normal.  Continue ferrous sulfate and multivitamins  Follow-up with dietitian.  TOC/social worker to follow-up with DSS.   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, scoliosis, chronic debility (wife said patient has not walked properly for over a year because of chronic back problems), hypertension, hyperlipidemia, tobacco use disorder  Consultants: Neurologist, palliative care Procedures performed: None  Disposition: Home health Diet recommendation:  Discharge Diet Orders (From admission, onward)     Start     Ordered   04/21/22 0000  Diet - low sodium heart healthy         04/21/22 1548           Cardiac diet DISCHARGE MEDICATION: Allergies as of 04/21/2022       Reactions   Bupropion Hcl Other (See Comments)   seizures        Medication List     STOP taking these medications    DULoxetine 20 MG capsule Commonly known as: CYMBALTA   erythromycin ophthalmic ointment   SF 5000 Plus 1.1 % Crea dental cream Generic drug: sodium fluoride   sodium polystyrene powder Commonly known as: KAYEXALATE       TAKE these medications    acetaminophen 500 MG tablet Commonly known as: TYLENOL Take 1 tablet (500 mg total) by mouth every 6 (six) hours as needed for mild pain.   albuterol 108 (90 Base) MCG/ACT inhaler Commonly known as: VENTOLIN HFA Inhale 2 puffs into the lungs every 6 (six) hours as needed for wheezing or shortness of breath.   aspirin EC 81 MG tablet Take 1 tablet (81 mg total) by mouth daily. Swallow whole. Start taking on: April 22, 2022   calcium carbonate 1500 (600 Ca) MG Tabs tablet Commonly known as: OSCAL Take 600 mg of elemental calcium by mouth 2 (two) times daily with a meal.   cholecalciferol 1000 units tablet Commonly known as: VITAMIN D Take 1,000 Units by mouth daily.   clopidogrel 75 MG tablet Commonly known as: PLAVIX TAKE 1 TABLET BY MOUTH ONCE DAILY What changed: when to take this   denosumab 60 MG/ML Sosy injection Commonly known as: PROLIA Inject 60 mg into the skin every 6 (six) months.   ferrous sulfate 325 (65 FE) MG tablet Take 1 tablet (325 mg total) by mouth every other day. Start taking on: April 22, 2022   furosemide 20 MG tablet Commonly known as: LASIX Take 1 tablet (20 mg total) by mouth daily. What changed: See the new instructions.   gabapentin 800 MG tablet Commonly known as: NEURONTIN TAKE 1 TABLET BY MOUTH 3 TIMES DAILY AS NEEDED AS DIRECTED   latanoprost 0.005 % ophthalmic solution Commonly known as: XALATAN Place 1-2 drops into both eyes See admin  instructions. Instill 1 drop into left eye at bedtime Instill 2 drops into right eye at bedtime   lisinopril 20 MG tablet Commonly known as: ZESTRIL TAKE 1 TABLET BY MOUTH ONCE DAILY   megestrol 400 MG/10ML suspension Commonly known as: MEGACE Take 20 mLs (800 mg total) by mouth daily.   mirtazapine 15 MG tablet Commonly known as: REMERON TAKE 1/2-1 TABLET BY MOUTH AT BEDTIME   omeprazole 20 MG capsule Commonly known as: PRILOSEC TAKE 1 CAPSULE BY MOUTH ONCE DAILY   oxyCODONE 5 MG immediate release tablet Commonly known as: Oxy IR/ROXICODONE Take 1 tablet (5 mg total) by mouth every 4 (four) hours as needed for severe pain.   rosuvastatin 20 MG tablet Commonly known as: CRESTOR TAKE 1 TABLET BY MOUTH AT BEDTIME   silver sulfADIAZINE 1 % cream Commonly known as: SILVADENE Apply topically daily for 7 days. Start taking on: April 22, 2022               Discharge Care Instructions  (From admission, onward)  Start     Ordered   04/21/22 0000  Discharge wound care:       Comments: Cleanse right thigh wound and knee wound with NS and pat dry daily. Apply silvadene cream, cover with gauze and kerlix/tape.  CHange daily.,  Silicone foam to right plantar heel wound, change every three days and PRN soilage.  Apply Mepilex border to stage II decubitus ulcer on coccyx/lower back   04/21/22 1548            Discharge Exam: Filed Weights   04/20/22 0311 04/20/22 2300 04/21/22 0452  Weight: 45.1 kg 47.1 kg 49.6 kg   GEN: NAD SKIN: Warm and dry. Chronic wounds on right thigh EYES: No pallor or icterus ENT: MMM CV: RRR PULM: CTA B ABD: soft, ND, NT, +BS CNS: AAO x 3, non focal EXT: No edema or tenderness   Condition at discharge: stable  The results of significant diagnostics from this hospitalization (including imaging, microbiology, ancillary and laboratory) are listed below for reference.   Imaging Studies: ECHOCARDIOGRAM  COMPLETE  Result Date: 04/10/2022    ECHOCARDIOGRAM REPORT   Patient Name:   Lucas Ramirez Date of Exam: 04/10/2022 Medical Rec #:  [id number]     Height:       67.0 in Accession #:    [id number]    Weight:       110.4 lb Date of Birth:  Feb 29, 1956     BSA:          1.571 m Patient Age:    26 years      BP:           150/79 mmHg Patient Gender: M             HR:           107 bpm. Exam Location:  [facility] Procedure: 2D Echo Indications:     Abnormal ECG R94.31  History:         Patient has prior history of Echocardiogram examinations, most                  recent 08/20/2020.  Sonographer:     Kathlen Brunswick RDCS Referring Phys:  [id number] Barb Merino Diagnosing Phys: Ida Rogue MD  Sonographer Comments: Technically challenging study due to limited acoustic windows, no parasternal window and no apical window. Image acquisition challenging due to patient body habitus and Image acquisition challenging due to respiratory motion. IMPRESSIONS  1. Left ventricular ejection fraction, by estimation, is 60 to 65%. The left ventricle has normal function. The left ventricle has no regional wall motion abnormalities. Left ventricular diastolic parameters are consistent with Grade I diastolic dysfunction (impaired relaxation).  2. Right ventricular systolic function is normal. The right ventricular size is normal. Tricuspid regurgitation signal is inadequate for assessing PA pressure.  3. A small pericardial effusion is present.  4. The mitral valve is normal in structure. Mild mitral valve regurgitation. No evidence of mitral stenosis.  5. The aortic valve has an indeterminant number of cusps. Aortic valve regurgitation is not visualized. Aortic valve sclerosis is present, with no evidence of aortic valve stenosis.  6. The inferior vena cava is normal in size with greater than 50% respiratory variability, suggesting right atrial pressure of 3 mmHg. FINDINGS  Left Ventricle: Left ventricular ejection fraction, by estimation,  is 60 to 65%. The left ventricle has normal function. The left ventricle has no regional wall motion abnormalities. The left ventricular internal cavity size was normal in  size. There is  no left ventricular hypertrophy. Left ventricular diastolic parameters are consistent with Grade I diastolic dysfunction (impaired relaxation). Right Ventricle: The right ventricular size is normal. No increase in right ventricular wall thickness. Right ventricular systolic function is normal. Tricuspid regurgitation signal is inadequate for assessing PA pressure. Left Atrium: Left atrial size was normal in size. Right Atrium: Right atrial size was normal in size. Pericardium: A small pericardial effusion is present. Mitral Valve: The mitral valve is normal in structure. There is mild calcification of the mitral valve leaflet(s). Mild mitral valve regurgitation. No evidence of mitral valve stenosis. Tricuspid Valve: The tricuspid valve is normal in structure. Tricuspid valve regurgitation is mild . No evidence of tricuspid stenosis. Aortic Valve: The aortic valve has an indeterminant number of cusps. Aortic valve regurgitation is not visualized. Aortic valve sclerosis is present, with no evidence of aortic valve stenosis. Aortic valve peak gradient measures 10.2 mmHg. Pulmonic Valve: The pulmonic valve was normal in structure. Pulmonic valve regurgitation is not visualized. No evidence of pulmonic stenosis. Aorta: The aortic root is normal in size and structure. Venous: The inferior vena cava is normal in size with greater than 50% respiratory variability, suggesting right atrial pressure of 3 mmHg. IAS/Shunts: No atrial level shunt detected by color flow Doppler.  AORTIC VALVE              PULMONIC VALVE AV Vmax:      160.00 cm/s PV Vmax:       1.13 m/s AV Peak Grad: 10.2 mmHg   PV Peak grad:  5.1 mmHg LVOT Vmax:    79.00 cm/s LVOT Vmean:   57.100 cm/s LVOT VTI:     0.132 m MITRAL VALVE MV Area (PHT): 8.92 cm    SHUNTS MV Decel  Time: 85 msec     Systemic VTI: 0.13 m MV E velocity: 51.90 cm/s MV A velocity: 90.10 cm/s MV E/A ratio:  0.58 Ida Rogue MD Electronically signed by Ida Rogue MD Signature Date/Time: 04/10/2022/3:04:04 PM    Final    CT ANGIO HEAD NECK W WO CM  Result Date: 04/10/2022 CLINICAL DATA:  66 year old male with altered mental status presentation yesterday. Right MCA middle and superior frontal gyrus infarct. Severe proximal right ICA stenosis on CTA earlier this year. 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 EXAM: CT ANGIOGRAPHY HEAD AND NECK TECHNIQUE: Multidetector CT imaging of the head and neck was performed using the standard protocol during bolus administration of intravenous contrast. Multiplanar CT image reconstructions and MIPs were obtained to evaluate the vascular anatomy. Carotid stenosis measurements (when applicable) are obtained utilizing NASCET criteria, using the distal internal carotid diameter as the denominator. RADIATION DOSE REDUCTION: This exam was performed according to the departmental dose-optimization program which includes automated exposure control, adjustment of the mA and/or kV according to patient size and/or use of iterative reconstruction technique. CONTRAST:  70m OMNIPAQUE IOHEXOL 350 MG/ML SOLN COMPARISON:  Head CT and brain MRI yesterday. CTA head and neck 08/26/2021. FINDINGS: CT HEAD Brain: Cytotoxic edema in the middle right MCA territory corresponding to the MRI diffusion abnormality yesterday appears unchanged by CT, no hemorrhage or mass effect. Small chronic lacunar infarct left caudate nucleus again noted. Small left cerebellar chronic lacunar infarct. Mild for age patchy cerebral white matter hypodensity in both hemispheres. Elsewhere normal gray-white differentiation. No midline shift, mass effect, or evidence of intracranial mass lesion. No ventriculomegaly. No new intracranial abnormality. Calvarium and skull base: No acute osseous abnormality  identified.  Possible small area of benign fibrous dysplasia at the right skull base series 7, image 23. Paranasal sinuses: Visualized paranasal sinuses and mastoids are clear. Orbits: Visualized orbits and scalp soft tissues are within normal limits. CTA NECK Skeleton: Stable mild degenerative cervical spondylolisthesis at C3. Partially visible thoracic scoliosis. No acute osseous abnormality identified. Upper chest: Regressed but not completely resolved right pleural effusion seen in May. Paraseptal emphysema. Visible central pulmonary arteries are patent. Negative visible superior mediastinum. Other neck: No acute neck soft tissue finding. Aortic arch: Calcified aortic atherosclerosis. Tortuous arch with 3 vessel configuration. Right carotid system: Only mild brachiocephalic artery plaque. Tortuous proximal right CCA. No plaque or stenosis before the bifurcation. Chronic right ICA near occlusion beginning at the origin and bulb, where only string sign enhancement remains visible (series 16, image 64) and since May the downstream right ICA caliber is reduced, resulting in functionally occluded vessel which is only faint with thread-like enhancement below the skull base (series 16, image 64). Left carotid system: Left CCA origin atherosclerosis is stable with less than 50% stenosis. Mild left carotid bifurcation atherosclerosis without stenosis and the left ICA remains patent to the skull base. Vertebral arteries: Proximal right subclavian artery is patent without stenosis. But there is severe stenosis at the right vertebral artery origin due to combined soft and calcified plaque on series 14, image 161. This is stable. Right vertebral remains patent with a prominent caliber to the skull base. Chronic occlusion left vertebral artery origin with stable thread-like reconstitution beginning in the V2 segment. And vessel reocclusion at the skull base. CTA HEAD Posterior circulation: Right vertebral artery remains patent  and the sulcal apply to the basilar. Left PICA appears reconstituted on series 2, image 141. Right PICA origin is patent. No significant right vertebral or basilar artery stenosis. Patent basilar tip, SCA and PCA origins. Bilateral PCA branches are within normal limits. Anterior circulation: Occluded right ICA siphon now, with reconstitution in the right cavernous segment. Right ICA terminus, right MCA and ACA origins remain patent with a fairly stable caliber since May (series 15, image 23). Left ICA siphon is patent with moderate supraclinoid calcified plaque and stenosis on series 14, image 106. This is stable to progressed since May. Left ICA terminus remains patent. Left MCA and ACA origin remain patent. Diminutive or absent anterior communicating artery. Symmetric bilateral ACA branch enhancement, stable since May. Left MCA M1 segment and bifurcation are patent without stenosis. Left MCA branches are stable with mild irregularity. Right MCA M1 segment remains patent to the bifurcation or trifurcation without stenosis. And there is no M2 branch occlusion identified. However, new tandem moderate to severe stenoses of the dominant posterior M2 on series 17, image 10. Venous sinuses: Early contrast timing, not well evaluated. Anatomic variants: None significant. Review of the MIP images confirms the above findings IMPRESSION: 1. Positive CTA: - Right ICA origin chronic STRING-SIGN-STENOSIS and since May has Effectively Occluded In The Neck. Reconstituted Right siphon beginning in the cavernous segment. - Right MCA and ACA remain patent, but with new Moderate To Severe posterior Right M2 stenoses since May. - Up to Moderate contralateral Left ICA siphon supraclinoid stenosis due to calcified plaque. - Chronic Left vertebral artery occlusion, unchanged. - Severe stenosis at the Right Vertebral Artery origin. 2. Stable Right MCA infarct since yesterday. No hemorrhage or mass effect. 3. Regressed right apical pleural  effusions since May. Emphysema (ICD10-J43.9). Electronically Signed   By: Genevie Ann M.D.   On: 04/10/2022 10:51  MR BRAIN WO CONTRAST  Result Date: 04/09/2022 CLINICAL DATA:  Altered mental status EXAM: MRI HEAD WITHOUT CONTRAST TECHNIQUE: Multiplanar, multiecho pulse sequences of the brain and surrounding structures were obtained without intravenous contrast. COMPARISON:  None Available. FINDINGS: Brain: Medium-sized acute infarct within the right frontal lobe. No other area of acute ischemia. No acute or chronic hemorrhage. Normal white matter signal, parenchymal volume and CSF spaces. The midline structures are normal. Vascular: Major flow voids are preserved. Skull and upper cervical spine: Normal calvarium and skull base. Visualized upper cervical spine and soft tissues are normal. Sinuses/Orbits:No paranasal sinus fluid levels or advanced mucosal thickening. No mastoid or middle ear effusion. Normal orbits. IMPRESSION: Medium-sized acute infarct within the right frontal lobe. No hemorrhage or mass effect. Electronically Signed   By: Ulyses Jarred M.D.   On: 04/09/2022 22:28   CT Angio Chest PE W and/or Wo Contrast  Result Date: 04/09/2022 CLINICAL DATA:  Pulmonary embolism. Scratched at pulmonary embolism suspected, high probability. Shortness of breath since yesterday. Tachycardia. EXAM: CT ANGIOGRAPHY CHEST WITH CONTRAST TECHNIQUE: Multidetector CT imaging of the chest was performed using the standard protocol during bolus administration of intravenous contrast. Multiplanar CT image reconstructions and MIPs were obtained to evaluate the vascular anatomy. RADIATION DOSE REDUCTION: This exam was performed according to the departmental dose-optimization program which includes automated exposure control, adjustment of the mA and/or kV according to patient size and/or use of iterative reconstruction technique. CONTRAST:  13m OMNIPAQUE IOHEXOL 350 MG/ML SOLN COMPARISON:  Two-view chest x-ray 04/09/2022  CTA chest 08/15/2021 FINDINGS: Cardiovascular: Heart size is normal. Atherosclerotic calcifications are present at the aortic arch and great vessel origins without significant stenosis or aneurysm. Pulmonary artery opacification is excellent. No focal filling defects are present to suggest pulmonary emboli. Mediastinum/Nodes: No enlarged mediastinal, hilar, or axillary lymph nodes. Thyroid gland, trachea, and esophagus demonstrate no significant findings. Lungs/Pleura: Chronic peripheral honeycombing is present anteriorly in the left upper lobe and in the posterior left lower lobe. Scarring is present the right lung base. Chronic right pleural thickening and calcifications are present. Chronic scarring is present. No superimposed acute disease is present. Airways are patent. Upper Abdomen: Chronic pancreatic calcifications are compatible with remote pancreatitis. Right renal atrophy again noted. No acute abnormalities are present. Musculoskeletal: Exaggerated thoracic kyphosis with marked rightward curvature again noted. Diffuse right lateral segments present in the lower thoracic and lumbar spine, likely congenital. Review of the MIP images confirms the above findings. IMPRESSION: 1. No pulmonary embolus. 2. Chronic peripheral honeycombing anteriorly in the left upper lobe and in the posterior left lower lobe compatible with chronic interstitial lung disease. 3. Chronic right pleural thickening and calcifications. 4. No acute superimposed pulmonary disease. 5. Chronic pancreatic calcifications compatible with remote pancreatitis. 6.  Aortic Atherosclerosis (ICD10-I70.0). Electronically Signed   By: CSan MorelleM.D.   On: 04/09/2022 16:55   CT HEAD WO CONTRAST (5MM)  Result Date: 04/09/2022 CLINICAL DATA:  Mental status change, unknown cause EXAM: CT HEAD WITHOUT CONTRAST TECHNIQUE: Contiguous axial images were obtained from the base of the skull through the vertex without intravenous contrast.  RADIATION DOSE REDUCTION: This exam was performed according to the departmental dose-optimization program which includes automated exposure control, adjustment of the mA and/or kV according to patient size and/or use of iterative reconstruction technique. COMPARISON:  08/26/2021 FINDINGS: Brain: New low-density changes within the high right frontoparietal region with loss of gray-white differentiation. No evidence hemorrhage, hydrocephalus, extra-axial collection, or mass. Vascular: Atherosclerotic calcifications involving the large  vessels of the skull base. No unexpected hyperdense vessel. Skull: Normal. Negative for fracture or focal lesion. Sinuses/Orbits: No acute finding. Other: None. IMPRESSION: New low-density changes within the high right frontoparietal region with loss of gray-white differentiation, suggestive of a acute or subacute infarct. Further evaluation with MRI is recommended. Electronically Signed   By: Davina Poke D.O.   On: 04/09/2022 16:50   DG Chest 2 View  Result Date: 04/09/2022 CLINICAL DATA:  Shortness of breath EXAM: CHEST - 2 VIEW COMPARISON:  Chest x-ray Aug 26, 2021 FINDINGS: The cardiomediastinal silhouette is unchanged in contour. Stable right lung base and pleural opacity, likely scarring/pleural thickening. Stable left basilar bandlike opacities, likely scarring. No acute pulmonary opacity. No large pneumothorax. The visualized upper abdomen is unremarkable. Severe thoracolumbar scoliosis. No acute osseous abnormality. IMPRESSION: No acute cardiopulmonary abnormality. Stable chronic changes in bibasilar lungs. Electronically Signed   By: Beryle Flock M.D.   On: 04/09/2022 13:59    Microbiology: Results for orders placed or performed during the hospital encounter of 04/09/22  Blood culture (routine x 2)     Status: None   Collection Time: 04/09/22  1:36 PM   Specimen: BLOOD  Result Value Ref Range Status   Specimen Description BLOOD RIGHT ANTECUBITAL  Final    Special Requests   Final    BOTTLES DRAWN AEROBIC AND ANAEROBIC Blood Culture adequate volume   Culture   Final    NO GROWTH 5 DAYS Performed at [facility], [address], [place], [place] [address]    Report Status 04/14/2022 FINAL  Final  Resp panel by RT-PCR (RSV, Flu A&B, Covid) Anterior Nasal Swab     Status: None   Collection Time: 04/09/22  3:28 PM   Specimen: Anterior Nasal Swab  Result Value Ref Range Status   SARS Coronavirus 2 by RT PCR NEGATIVE NEGATIVE Final    Comment: (NOTE) SARS-CoV-2 target nucleic acids are NOT DETECTED.  The SARS-CoV-2 RNA is generally detectable in upper respiratory specimens during the acute phase of infection. The lowest concentration of SARS-CoV-2 viral copies this assay can detect is 138 copies/mL. A negative result does not preclude SARS-Cov-2 infection and should not be used as the sole basis for treatment or other patient management decisions. A negative result may occur with  improper specimen collection/handling, submission of specimen other than nasopharyngeal swab, presence of viral mutation(s) within the areas targeted by this assay, and inadequate number of viral copies(<138 copies/mL). A negative result must be combined with clinical observations, patient history, and epidemiological information. The expected result is Negative.  Fact Sheet for Patients:  EntrepreneurPulse.com.au  Fact Sheet for Healthcare Providers:  IncredibleEmployment.be  This test is no t yet approved or cleared by the [place] FDA and  has been authorized for detection and/or diagnosis of SARS-CoV-2 by FDA under an Emergency Use Authorization (EUA). This EUA will remain  in effect (meaning this test can be used) for the duration of the COVID-19 declaration under Section 564(b)(1) of the Act, 21 U.S.C.section 360bbb-3(b)(1), unless the authorization is terminated  or revoked sooner.       Influenza  A by PCR NEGATIVE NEGATIVE Final   Influenza B by PCR NEGATIVE NEGATIVE Final    Comment: (NOTE) The Xpert Xpress SARS-CoV-2/FLU/RSV plus assay is intended as an aid in the diagnosis of influenza from Nasopharyngeal swab specimens and should not be used as a sole basis for treatment. Nasal washings and aspirates are unacceptable for Xpert Xpress SARS-CoV-2/FLU/RSV testing.  Fact Sheet for Patients: EntrepreneurPulse.com.au  Fact Sheet for Healthcare Providers: IncredibleEmployment.be  This test is not yet approved or cleared by the [place] FDA and has been authorized for detection and/or diagnosis of SARS-CoV-2 by FDA under an Emergency Use Authorization (EUA). This EUA will remain in effect (meaning this test can be used) for the duration of the COVID-19 declaration under Section 564(b)(1) of the Act, 21 U.S.C. section 360bbb-3(b)(1), unless the authorization is terminated or revoked.     Resp Syncytial Virus by PCR NEGATIVE NEGATIVE Final    Comment: (NOTE) Fact Sheet for Patients: EntrepreneurPulse.com.au  Fact Sheet for Healthcare Providers: IncredibleEmployment.be  This test is not yet approved or cleared by the [place] FDA and has been authorized for detection and/or diagnosis of SARS-CoV-2 by FDA under an Emergency Use Authorization (EUA). This EUA will remain in effect (meaning this test can be used) for the duration of the COVID-19 declaration under Section 564(b)(1) of the Act, 21 U.S.C. section 360bbb-3(b)(1), unless the authorization is terminated or revoked.  Performed at [facility], [address] [place], [place] [address]   Blood culture (routine x 2)     Status: None   Collection Time: 04/09/22  8:40 PM   Specimen: Left Antecubital; Blood  Result Value Ref Range Status   Specimen Description LEFT ANTECUBITAL  Final   Special Requests   Final    BOTTLES DRAWN  AEROBIC AND ANAEROBIC Blood Culture adequate volume   Culture   Final    NO GROWTH 5 DAYS Performed at [facility], [address], [place], [place] [address]    Report Status 04/14/2022 FINAL  Final  C Difficile Quick Screen w PCR reflex     Status: Abnormal   Collection Time: 04/10/22 12:02 PM   Specimen: STOOL  Result Value Ref Range Status   C Diff antigen POSITIVE (A) NEGATIVE Final   C Diff toxin NEGATIVE NEGATIVE Final   C Diff interpretation Results are indeterminate. See PCR results.  Final    Comment: Performed at [facility], [address] [place], [place] [address]  C. Diff by PCR, Reflexed     Status: Abnormal   Collection Time: 04/10/22 12:02 PM  Result Value Ref Range Status   Toxigenic C. Difficile by PCR POSITIVE (A) NEGATIVE Final    Comment: Positive for toxigenic C. difficile with little to no toxin production. Only treat if clinical presentation suggests symptomatic illness. Performed at [facility], [address] [place], [place] [address]     Labs: CBC: Recent Labs  Lab 04/16/22 0505 04/20/22 0630 04/21/22 0939  WBC 12.6* 15.1* 12.8*  NEUTROABS 10.4*  --  10.0*  HGB 9.1* 8.7* 9.2*  HCT 26.7* 26.4* 28.0*  MCV 89.0 91.3 90.0  PLT 511* 669* 297*   Basic Metabolic Panel: Recent Labs  Lab 04/16/22 0505 04/21/22 0939  NA 133* 133*  K 4.6 4.6  CL 95* 97*  CO2 34* 28  GLUCOSE 112* 151*  BUN 21 30*  CREATININE 0.72 0.80  CALCIUM 8.5* 8.0*  MG 2.0  --   PHOS 4.6  --    Liver Function Tests: No results for input(s): "AST", "ALT", "ALKPHOS", "BILITOT", "PROT", "ALBUMIN" in the last 168 hours. CBG: Recent Labs  Lab 04/16/22 0820 04/17/22 0818 04/19/22 0808 04/21/22 0739 04/21/22 1144  GLUCAP 95 107* 108* 132* 149*    Discharge time spent: greater than 30 minutes.  Signed: Jennye Boroughs, MD [facility] 04/21/2022

## 2022-04-21 NOTE — Progress Notes (Addendum)
CSW spoke with [facility] who decided against taking pt at facility.  Lavella Lemons spoke with pts wife, Jenny Reichmann.  CSW explained to [place] that there are other bed offers in [place] area, however, she is declining. She states that it would be too much strain on her family. Pt will receive HH OT/PT/RN.  Information relayed to family and pt is medically ready per MD.  [facility] services to be provided by [facility].

## 2022-04-21 NOTE — Progress Notes (Incomplete)
Progress Note    Lucas Ramirez  ZOX:[id number] DOB: Apr 02, 1956  DOA: 04/09/2022 PCP: Birdie Sons, MD      Brief Narrative:    Medical records reviewed and are as summarized below:  Lucas Ramirez is a 66 y.o. 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  He complained of inability to walk because of profound generalized weakness. .     Assessment/Plan:   Principal Problem:   Shortness of breath Active Problems:   HLD (hyperlipidemia)   HTN (hypertension)   Stroke (cerebrum) (HCC)   Cerebrovascular disease   Tobacco dependence   Cachexia (HCC)   Pressure injury of skin   Protein-calorie malnutrition, severe   Chronic diastolic CHF (congestive heart failure) (HCC)   C. difficile diarrhea   Interstitial lung disease (HCC)   Nutrition Problem: Severe Malnutrition Etiology: chronic illness (COPD)  Signs/Symptoms: moderate fat depletion, severe fat depletion, moderate muscle depletion, severe muscle depletion   Body mass index is 17.13 kg/m.   Shortness of breath, likely multifactorial, interstitial lung disease on CT chest: He is tolerating room air.  CTA negative for pulmonary embolism, no acute infection but there is evidence of chronic scarring with honeycombing.  Acute stroke/right infarcts in the right frontal lobe: Continue  aspirin and Plavix.  LDL was 50.  C. difficile diarrhea: Diarrhea has improved.  Completed 10-day course of oral vancomycin on 04/20/2022.  C. difficile toxin was negative but toxigenic C. difficile PCR and C. difficile antigen were positive.    Hypertension, chronic diastolic CHF: Compensated.  Peripheral edema has improved.  Continue Lasix and losartan.  2D echo from 04/10/2022 showed EF estimated at 60 to 81%, grade 1 diastolic dysfunction  Failure to thrive, cachexia, multivitamin deficiency (B6, K1, D, E), iron deficiency: Continue Megace and Remeron.  Vitamin A, B1, B12, C levels were normal.  Continue ferrous sulfate and multivitamins  Follow-up with dietitian.  TOC/social worker to follow-up with DSS.  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 Yes; Fluid consistency: Thin  Diet effective now                            Consultants: Neurologist Palliative care  Procedures: None    Medications:     stroke: early stages of recovery book   Does not apply Once   (feeding supplement) PROSource Plus  30 mL Oral TID BM   aspirin  EC  81 mg Oral Daily   calcium carbonate  500 mg of elemental calcium Oral BID WC   cholecalciferol  2,000 Units Oral Daily   clopidogrel  75 mg Oral Daily   enoxaparin (LOVENOX) injection  40 mg Subcutaneous Q24H   feeding supplement  237 mL Oral QID   ferrous sulfate  325 mg Oral QODAY   furosemide  20 mg Oral Daily   gabapentin  800 mg Oral TID   latanoprost  1-2 drop Both Eyes QHS   losartan  25 mg  Oral Daily   megestrol  400 mg Oral BID   mirtazapine  7.5 mg Oral QHS   multivitamin with minerals  1 tablet Oral Daily   nicotine  21 mg Transdermal Daily   pantoprazole  40 mg Oral Daily   silver sulfADIAZINE   Topical Daily   Continuous Infusions:   Anti-infectives (From admission, onward)    Start     Dose/Rate Route Frequency Ordered Stop   04/10/22 1530  vancomycin (VANCOCIN) capsule 125 mg        125 mg Oral 4 times daily 04/10/22 1437 04/20/22 0842   04/09/22 1730  vancomycin (VANCOREADY) IVPB 1250 mg/250 mL        1,250 mg 166.7 mL/hr over 90 Minutes Intravenous  Once 04/09/22 1715 04/09/22 1945   04/09/22 1715  ceFEPIme (MAXIPIME) 2 g in sodium chloride 0.9 % 100 mL IVPB        2 g 200 mL/hr over 30 Minutes Intravenous  Once 04/09/22 1715 04/09/22 1909              Family Communication/Anticipated D/C date and plan/Code Status   DVT prophylaxis: enoxaparin (LOVENOX) injection 40 mg Start: 04/10/22 0800     Code Status: DNR  Family Communication: None Disposition Plan: Plan to discharge to SNF   Status is: Inpatient Remains inpatient appropriate because: Awaiting placement to SNF       Subjective:   Interval events noted.  Diarrhea has improved.  No shortness of breath or chest pain.  He has no complaints.  Objective:    Vitals:   04/21/22 0318 04/21/22 0452 04/21/22 0738 04/21/22 1605  BP: 121/72  (!) 147/79 127/68  Pulse: 99  93 (!) 104  Resp: '18  16 20  '$ Temp: 98.6 F (37 C)  97.9 F (36.6 C) 98.1 F (36.7 C)  TempSrc: Oral     SpO2: 95%  100% 100%  Weight:  49.6 kg    Height:       No data found.   Intake/Output Summary (Last 24 hours) at 04/21/2022 1642 Last data filed at 04/21/2022 1611 Gross per 24 hour  Intake 240 ml  Output 2000 ml  Net -1760 ml   Filed Weights   04/20/22 0311 04/20/22 2300 04/21/22 0452  Weight: 45.1 kg 47.1 kg 49.6 kg    Exam:  GEN: NAD SKIN: Warm and dry.  Chronic wounds on the right  thigh EYES: No pallor or icterus ENT: MMM CV: RRR PULM: CTA B ABD: soft, ND, NT, +BS CNS: AAO x 3, non focal EXT: No edema or tenderness     Pressure Injury 04/09/22 Coccyx Stage 2 -  Partial thickness loss of dermis presenting as a shallow open injury with a red, pink wound bed without slough. (Active)  04/09/22 2015  Location: Coccyx  Location Orientation:   Staging: Stage 2 -  Partial thickness loss of dermis presenting as a shallow open injury with a  red, pink wound bed without slough.  Wound Description (Comments):   Present on Admission: Yes  Dressing Type Foam - Lift dressing to assess site every shift 04/19/22 2019     Data Reviewed:   I have personally reviewed following labs and imaging studies:  Labs: Labs show the following:   Basic Metabolic Panel: Recent Labs  Lab 04/16/22 0505 04/21/22 0939  NA 133* 133*  K 4.6 4.6  CL 95* 97*  CO2 34* 28  GLUCOSE 112* 151*  BUN 21 30*  CREATININE 0.72 0.80  CALCIUM 8.5* 8.0*  MG 2.0  --   PHOS 4.6  --    GFR Estimated Creatinine Clearance: 63.7 mL/min (by C-G formula based on SCr of 0.8 mg/dL). Liver Function Tests: No results for input(s): "AST", "ALT", "ALKPHOS", "BILITOT", "PROT", "ALBUMIN" in the last 168 hours.  No results for input(s): "LIPASE", "AMYLASE" in the last 168 hours. No results for input(s): "AMMONIA" in the last 168 hours.  Coagulation profile No results for input(s): "INR", "PROTIME" in the last 168 hours.  CBC: Recent Labs  Lab 04/16/22 0505 04/20/22 0630 04/21/22 0939  WBC 12.6* 15.1* 12.8*  NEUTROABS 10.4*  --  10.0*  HGB 9.1* 8.7* 9.2*  HCT 26.7* 26.4* 28.0*  MCV 89.0 91.3 90.0  PLT 511* 669* 706*   Cardiac Enzymes: No results for input(s): "CKTOTAL", "CKMB", "CKMBINDEX", "TROPONINI" in the last 168 hours. BNP (last 3 results) No results for input(s): "PROBNP" in the last 8760 hours. CBG: Recent Labs  Lab 04/16/22 0820 04/17/22 0818 04/19/22 0808 04/21/22 0739  04/21/22 1144  GLUCAP 95 107* 108* 132* 149*   D-Dimer: No results for input(s): "DDIMER" in the last 72 hours. Hgb A1c: No results for input(s): "HGBA1C" in the last 72 hours. Lipid Profile: No results for input(s): "CHOL", "HDL", "LDLCALC", "TRIG", "CHOLHDL", "LDLDIRECT" in the last 72 hours. Thyroid function studies: No results for input(s): "TSH", "T4TOTAL", "T3FREE", "THYROIDAB" in the last 72 hours.  Invalid input(s): "FREET3" Anemia work up: No results for input(s): "VITAMINB12", "FOLATE", "FERRITIN", "TIBC", "IRON", "RETICCTPCT" in the last 72 hours. Sepsis Labs: Recent Labs  Lab 04/16/22 0505 04/20/22 0630 04/21/22 0939  WBC 12.6* 15.1* 12.8*    Microbiology No results found for this or any previous visit (from the past 240 hour(s)).   Procedures and diagnostic studies:  No results found.             LOS: 11 days   [place] Copywriter, advertising on [email or web address]. If 7PM-7AM, please contact night-coverage at [email or web address]     04/21/2022, 4:42 PM

## 2022-04-22 NOTE — TOC Transition Note (Signed)
Transition of Care [facility]) - CM/SW Discharge Note   Patient Details  Name: Lucas Ramirez MRN: [id number] Date of Birth: 05-26-1955  [facility] of [facility]) CM/SW Contact:  Quin Hoop, LCSW Phone Number: 04/22/2022, 9:17 AM   Clinical Narrative:     Patient discharged home with home health services provided by [facility] (PT/OT) and [facility] (RN).  Patient transported home by his wife.  No other TOC needs.  Final next level of care: [place] Barriers to Discharge: No Barriers Identified   Patient Goals and CMS Choice      Discharge Placement                         Discharge Plan and Services Additional resources added to the After Visit Summary for                            HH Arranged: RN, PT, OT (PT/OT Provided by [facility], RN provided by Blanca Friend) [facility] Agency: [facility] [place], [place] Date [place]: 04/22/22   Representative spoke with at [place]: Cyril Mourning [facility]) Mortimer Fries Blanca Friend)  Social Determinants of Health ([place]) Interventions [place]: No Food Insecurity (01/24/2022)  Housing: Low Risk  (01/24/2022)  Transportation Needs: No Transportation Needs (01/24/2022)  Utilities: Not At Risk (01/24/2022)  Alcohol Screen: Low Risk  (01/24/2022)  Depression (PHQ2-9): Low Risk  (01/24/2022)  Recent Concern: Depression (PHQ2-9) - Medium Risk (12/24/2021)  Financial Resource Strain: Low Risk  (01/24/2022)  Physical Activity: Inactive (01/24/2022)  Social Connections: Moderately Isolated (01/24/2022)  Stress: No Stress Concern Present (01/24/2022)  Tobacco Use: High Risk (04/09/2022)     Readmission Risk Interventions     No data to display

## 2022-04-26 ENCOUNTER — Telehealth: Payer: Self-pay | Admitting: *Deleted

## 2022-04-26 ENCOUNTER — Ambulatory Visit: Payer: Self-pay

## 2022-04-26 NOTE — Patient Outreach (Signed)
  Care Coordination [facility] Note Transition Care Management Unsuccessful Follow-up Telephone Call  Date of discharge and from where:  [phone] [facility]  Attempts:  1st Attempt  Reason for unsuccessful TCM follow-up call:  Left voice message  [place] Care Management [phone]

## 2022-04-26 NOTE — Telephone Encounter (Signed)
   Chief Complaint: Caitlyn OT with [facility] reports pt. Did noit take BP medication today, BP 200/109 pulse 109. Has pain as well related to his back. No symptoms. Pt. Took medications and BP now 170/100.  Symptoms: No symptoms Frequency: Today Pertinent Negatives: Patient denies  Disposition: '[]'$ ED /'[]'$ Urgent Care (no appt availability in office) / '[]'$ Appointment(In office/virtual)/ '[]'$  [place] Virtual [facility]/ '[]'$ Home Care/ '[]'$ Refused Recommended Disposition /'[]'$ [place] [facility]/ '[x]'$  Follow-up with PCP Additional Notes: Family will re-check BP in 45 minutes and call with reading.  Reason for Disposition  [8] Systolic BP  >= 887 OR Diastolic >= 579 AND [7] missed most recent dose of blood pressure medication  Answer Assessment - Initial Assessment Questions 1. BLOOD PRESSURE: "What is the blood pressure?" "Did you take at least two measurements 5 minutes apart?"   200/109  pulse 109 2. ONSET: "When did you take your blood pressure?"     Today 3. HOW: "How did you take your blood pressure?" (e.g., automatic home BP monitor, visiting nurse)     OT 4. HISTORY: "Do you have a history of high blood pressure?"     yES 5. MEDICINES: "Are you taking any medicines for blood pressure?" "Have you missed any doses recently?"     Yes 6. OTHER SYMPTOMS: "Do you have any symptoms?" (e.g., blurred vision, chest pain, difficulty breathing, headache, weakness)     No 7. PREGNANCY: "Is there any chance you are pregnant?" "When was your last menstrual period?"     N/a  Protocols used: Blood Pressure - High-A-AH

## 2022-04-27 ENCOUNTER — Telehealth: Payer: Self-pay

## 2022-04-27 ENCOUNTER — Telehealth: Payer: Self-pay | Admitting: *Deleted

## 2022-04-27 NOTE — Patient Outreach (Signed)
  Care Coordination TOC Note Transition Care Management Follow-up Telephone Call Date of discharge and from where: 04/21/22-[facility]   Dx; "SOB,AMS" How have you been since you were released from the hospital? Patient states he is doing okay. He did not wish to speak at length and was guarded with his response. He voices he is using walker and has support in the home to assist him.  Any questions or concerns? No  Items Reviewed: Did the pt receive and understand the discharge instructions provided? Yes  Medications obtained and verified? No -pt declined Other? Yes -fall/safety precautions Any new allergies since your discharge? No  Dietary orders reviewed? Yes Do you have support at home? Yes   Home Care and Equipment/Supplies: Were home health services ordered? yes If so, what is the name of the agency? [facility]  Has the agency set up a time to come to the patient's home? Yes-pt voices disciplines have been out to see him already Were any new equipment or medical supplies ordered?  No What is the name of the medical supply agency? N/A Were you able to get the supplies/equipment? not applicable Do you have any questions related to the use of the equipment or supplies? No  Functional Questionnaire: (I = Independent and D = Dependent) ADLs: A  Bathing/Dressing- A  Meal Prep- A  Eating- I  Maintaining continence- I  Transferring/Ambulation- I  Managing Meds- A  Follow up appointments reviewed:  PCP Hospital f/u appt confirmed? No -referred to care guide to assist with scheduling appt [facility] f/u appt confirmed?  N/A Are transportation arrangements needed? No  If their condition worsens, is the pt aware to call PCP or go to the Emergency Dept.? Yes Was the patient provided with contact information for the PCP's office or ED? No Was to pt encouraged to call back with questions or concerns? Yes  SDOH assessments and interventions completed:   Yes SDOH  Interventions Today    Flowsheet Row Most Recent Value  SDOH Interventions   Food Insecurity Interventions Intervention Not Indicated  Transportation Interventions Intervention Not Indicated       Care Coordination Interventions:  PCP follow up appointment requested Education provided    Encounter Outcome:  Pt. Visit Completed    Enzo Montgomery, RN,BSN,CCM [place] [facility] Telephonic Care Management Coordinator Direct Phone: [phone] Toll Free: [phone] Fax: [phone]

## 2022-04-27 NOTE — Progress Notes (Signed)
  Care Coordination  Note  04/27/2022 Name: Lucas Ramirez MRN: [id number] DOB: Aug 21, 1955  Lucas Ramirez is a 67 y.o. year old primary care patient of Caryn Section, Kirstie Peri, MD. I reached out to [name] by phone today to assist with scheduling a follow up appointment. [name] verbally consented to my assistance.       Follow up plan: Hospital Follow Up appointment scheduled with (Dr Caryn Section) on (05/06/2022) at (1140am).  Julian Hy, [place] Direct Dial: [phone]

## 2022-04-28 ENCOUNTER — Telehealth: Payer: Self-pay | Admitting: Family Medicine

## 2022-04-28 NOTE — Telephone Encounter (Signed)
Copied from [place] [phone]. Topic: General - Other >> Apr 28, 2022 11:19 AM [place] C wrote: Reason for CRM: Manuela Schwartz with [facility] has called to share the following information   The patient was seen yesterday 04/27/22 around 12 PM   Heart rate 102  Pain 7:10  Manuela Schwartz would also like to know if the patient can be prescribed a hospital bed  Manuela Schwartz would also like to discuss contact parameters for the patient's pain management   Please contact further when possible

## 2022-04-28 NOTE — Telephone Encounter (Signed)
Copied from [place] [phone]. Topic: Quick Communication - Home Health Verbal Orders >> Apr 28, 2022 11:16 AM [place] C wrote: Caller/Agency: Modesta Messing At [facility] Number: [phone] Requesting OT/PT/Skilled Nursing/Social Work/Speech Therapy: PT Frequency: 2w4 1w4

## 2022-04-28 NOTE — Telephone Encounter (Signed)
That's fine

## 2022-04-28 NOTE — Telephone Encounter (Signed)
Left detailed voicemail advising of approved orders.

## 2022-05-02 DIAGNOSIS — R0602 Shortness of breath: Secondary | ICD-10-CM | POA: Diagnosis not present

## 2022-05-02 DIAGNOSIS — K219 Gastro-esophageal reflux disease without esophagitis: Secondary | ICD-10-CM | POA: Diagnosis not present

## 2022-05-02 DIAGNOSIS — I110 Hypertensive heart disease with heart failure: Secondary | ICD-10-CM

## 2022-05-02 DIAGNOSIS — G9340 Encephalopathy, unspecified: Secondary | ICD-10-CM | POA: Diagnosis not present

## 2022-05-02 DIAGNOSIS — I5032 Chronic diastolic (congestive) heart failure: Secondary | ICD-10-CM | POA: Diagnosis not present

## 2022-05-02 DIAGNOSIS — E43 Unspecified severe protein-calorie malnutrition: Secondary | ICD-10-CM | POA: Diagnosis not present

## 2022-05-02 DIAGNOSIS — R64 Cachexia: Secondary | ICD-10-CM | POA: Diagnosis not present

## 2022-05-02 DIAGNOSIS — T24011D Burn of unspecified degree of right thigh, subsequent encounter: Secondary | ICD-10-CM | POA: Diagnosis not present

## 2022-05-02 DIAGNOSIS — E785 Hyperlipidemia, unspecified: Secondary | ICD-10-CM | POA: Diagnosis not present

## 2022-05-02 DIAGNOSIS — J449 Chronic obstructive pulmonary disease, unspecified: Secondary | ICD-10-CM | POA: Diagnosis not present

## 2022-05-03 ENCOUNTER — Other Ambulatory Visit: Payer: Self-pay | Admitting: Family Medicine

## 2022-05-03 NOTE — Telephone Encounter (Signed)
Requested medications are due for refill today.  yes  Requested medications are on the active medications list.  yes  Last refill. 04/07/2022 #180 0 rf  Future visit scheduled.   yes  Notes to clinic.  Refill not delegated.    Requested Prescriptions  Pending Prescriptions Disp Refills   oxyCODONE (OXY IR/ROXICODONE) 5 MG immediate release tablet [Pharmacy Med Name: OXYCODONE HCL 5 MG TAB] 180 tablet     Sig: TAKE 1 TABLET BY MOUTH EVERY 4 HOURS     Not Delegated - Analgesics:  Opioid Agonists Failed - 05/03/2022  2:10 PM      Failed - This refill cannot be delegated      Failed - Urine Drug Screen completed in last 360 days      Passed - Valid encounter within last 3 months    Recent Outpatient Visits           1 month ago Cachexia [facility])   [facility] Birdie Sons, MD   4 months ago Abnormal weight loss   [place], [place]   8 months ago Hypotension, unspecified hypotension type   [facility] Birdie Sons, MD   10 months ago Prostate cancer screening   [facility] Birdie Sons, MD   1 year ago Chronic right-sided low back pain with right-sided sciatica   [facility] Birdie Sons, MD       Future Appointments             In 3 days Fisher, Kirstie Peri, MD [facility] [place]

## 2022-05-06 ENCOUNTER — Ambulatory Visit (INDEPENDENT_AMBULATORY_CARE_PROVIDER_SITE_OTHER): Payer: Medicare PPO | Admitting: Family Medicine

## 2022-05-06 ENCOUNTER — Encounter: Payer: Self-pay | Admitting: Family Medicine

## 2022-05-06 ENCOUNTER — Telehealth: Payer: Self-pay

## 2022-05-06 VITALS — BP 115/64 | HR 140 | Wt 99.0 lb

## 2022-05-06 DIAGNOSIS — G8929 Other chronic pain: Secondary | ICD-10-CM

## 2022-05-06 DIAGNOSIS — E46 Unspecified protein-calorie malnutrition: Secondary | ICD-10-CM | POA: Diagnosis not present

## 2022-05-06 DIAGNOSIS — R64 Cachexia: Secondary | ICD-10-CM | POA: Diagnosis not present

## 2022-05-06 DIAGNOSIS — R627 Adult failure to thrive: Secondary | ICD-10-CM | POA: Diagnosis not present

## 2022-05-06 DIAGNOSIS — I679 Cerebrovascular disease, unspecified: Secondary | ICD-10-CM | POA: Diagnosis not present

## 2022-05-06 DIAGNOSIS — M5441 Lumbago with sciatica, right side: Secondary | ICD-10-CM

## 2022-05-06 DIAGNOSIS — D72829 Elevated white blood cell count, unspecified: Secondary | ICD-10-CM | POA: Diagnosis not present

## 2022-05-06 DIAGNOSIS — D75839 Thrombocytosis, unspecified: Secondary | ICD-10-CM

## 2022-05-06 DIAGNOSIS — E531 Pyridoxine deficiency: Secondary | ICD-10-CM | POA: Diagnosis not present

## 2022-05-06 DIAGNOSIS — F17200 Nicotine dependence, unspecified, uncomplicated: Secondary | ICD-10-CM

## 2022-05-06 DIAGNOSIS — D649 Anemia, unspecified: Secondary | ICD-10-CM | POA: Diagnosis not present

## 2022-05-06 DIAGNOSIS — M419 Scoliosis, unspecified: Secondary | ICD-10-CM

## 2022-05-06 MED ORDER — CYPROHEPTADINE HCL 4 MG PO TABS: 4.0000 mg | ORAL_TABLET | Freq: Three times a day (TID) | ORAL | 0 refills | Status: DC | PRN

## 2022-05-06 NOTE — Telephone Encounter (Signed)
Copied from [place] [phone]. Topic: General - Inquiry >> May 06, 2022  2:59 PM Marcellus Scott wrote: Reason for CRM: Jesse Sans from [facility] is calling to report that the patient missed both PT visits this week. Yesterday, because of no authorization, and today pt had a doctor's appointment. Manuela Schwartz would also like to follow up on request for an order for hospital bed.  Please advise.

## 2022-05-06 NOTE — Progress Notes (Unsigned)
     I,Sha'taria Tige Meas,acting as a Education administrator for Lelon Huh, MD.,have documented all relevant documentation on the behalf of Lelon Huh, MD,as directed by  Lelon Huh, MD while in the presence of Lelon Huh, MD.   Established patient visit   Patient: Lucas Ramirez   DOB: 12-08-1955   67 y.o. Male  MRN: [id number] Visit Date: 05/06/2022  Today's healthcare provider: Lelon Huh, MD   No chief complaint on file.  Subjective    HPI  -would like liver enzymes checked Follow up Hospitalization  Patient was admitted to [facility] on 04/09/22 and discharged on 04/21/22. He was treated for ***. Treatment for this included ***. Telephone follow up was done on 04/27/22 He reports excellent compliance with treatment. He reports this condition is improved.  ----------------------------------------------------------------------------------------- -   Medications: Outpatient Medications Prior to Visit  Medication Sig   acetaminophen (TYLENOL) 500 MG tablet Take 1 tablet (500 mg total) by mouth every 6 (six) hours as needed for mild pain.   albuterol (VENTOLIN HFA) 108 (90 Base) MCG/ACT inhaler Inhale 2 puffs into the lungs every 6 (six) hours as needed for wheezing or shortness of breath.   aspirin EC 81 MG tablet Take 1 tablet (81 mg total) by mouth daily. Swallow whole.   calcium carbonate (OSCAL) 1500 (600 Ca) MG TABS tablet Take 600 mg of elemental calcium by mouth 2 (two) times daily with a meal.   cholecalciferol (VITAMIN D) 1000 UNITS tablet Take 1,000 Units by mouth daily.   clopidogrel (PLAVIX) 75 MG tablet TAKE 1 TABLET BY MOUTH ONCE DAILY (Patient taking differently: Take 75 mg by mouth at bedtime.)   denosumab (PROLIA) 60 MG/ML SOSY injection Inject 60 mg into the skin every 6 (six) months.   ferrous sulfate 325 (65 FE) MG tablet Take 1 tablet (325 mg total) by mouth every other day.   furosemide (LASIX) 20 MG tablet Take 1 tablet (20 mg total) by mouth daily.   gabapentin  (NEURONTIN) 800 MG tablet TAKE 1 TABLET BY MOUTH 3 TIMES DAILY AS NEEDED AS DIRECTED   latanoprost (XALATAN) 0.005 % ophthalmic solution Place 1-2 drops into both eyes See admin instructions. Instill 1 drop into left eye at bedtime Instill 2 drops into right eye at bedtime (Patient not taking: Reported on 04/09/2022)   lisinopril (ZESTRIL) 20 MG tablet TAKE 1 TABLET BY MOUTH ONCE DAILY   megestrol (MEGACE) 400 MG/10ML suspension Take 20 mLs (800 mg total) by mouth daily.   mirtazapine (REMERON) 15 MG tablet TAKE 1/2-1 TABLET BY MOUTH AT BEDTIME   omeprazole (PRILOSEC) 20 MG capsule TAKE 1 CAPSULE BY MOUTH ONCE DAILY   oxyCODONE (OXY IR/ROXICODONE) 5 MG immediate release tablet Take 1 tablet (5 mg total) by mouth every 4 (four) hours as needed for severe pain.   rosuvastatin (CRESTOR) 20 MG tablet TAKE 1 TABLET BY MOUTH AT BEDTIME   No facility-administered medications prior to visit.    Review of Systems  {Labs  Heme  Chem  Endocrine  Serology  Results Review (optional):23779}   Objective    There were no vitals taken for this visit. {Show previous vital signs (optional):23777}  Physical Exam  ***  No results found for any visits on 05/06/22.  Assessment & Plan     ***  No follow-ups on file.      {provider attestation***:1}   Lelon Huh, MD  [facility] [phone] (phone) [phone] (fax)  [place]

## 2022-05-10 ENCOUNTER — Telehealth: Payer: Self-pay | Admitting: Family Medicine

## 2022-05-10 ENCOUNTER — Ambulatory Visit: Payer: Self-pay

## 2022-05-10 ENCOUNTER — Encounter: Payer: Self-pay | Admitting: Family Medicine

## 2022-05-10 NOTE — Telephone Encounter (Signed)
  Chief Complaint: HTN Symptoms: BP 170/100, HR 120 Frequency: today Pertinent Negatives: Patient denies any sx  Disposition: '[x]'$ ED /'[]'$ Urgent Care (no appt availability in office) / '[]'$ Appointment(In office/virtual)/ '[]'$  [place] [facility]/ '[]'$ Home Care/ '[]'$ Refused Recommended Disposition /'[]'$ [place] [facility]/ '[]'$  Follow-up with PCP Additional Notes: Catilyn, OT with [facility] calling to report pt's BP and increased HR today. Pt had EMS called yesterday d/t increased confusion and found increased HR of 125 yesterday but pt refused to go ED with them. Today pt asymptomatic but HR 120 and pt hasn't taken BP meds yet and hasn't slept any. Had just taken Lisinopril when I asked but did advise d/t yesterday event and today go to ED for eval. She verbalized understanding and pt's family heard as well. No further assistance needed.   Reason for Disposition  Systolic BP  >= 539 OR Diastolic >= 767  Answer Assessment - Initial Assessment Questions 1. BLOOD PRESSURE: "What is the blood pressure?" "Did you take at least two measurements 5 minutes apart?"     170/100, HR 120 2. ONSET: "When did you take your blood pressure?"     This morning  3. HOW: "How did you take your blood pressure?" (e.g., automatic home BP monitor, visiting nurse)     OT [place] there 5. MEDICINES: "Are you taking any medicines for blood pressure?" "Have you missed any doses recently?"     Just took meds  6. OTHER SYMPTOMS: "Do you have any symptoms?" (e.g., blurred vision, chest pain, difficulty breathing, headache, weakness)     Hadn't had a lot of sleep last night  Protocols used: Blood Pressure - High-A-AH

## 2022-05-10 NOTE — Telephone Encounter (Signed)
Jesse Sans PT from [facility] to report stats:   HR 126 at rest  BP 156/76 O2 97 Some Confusion Respirations 28 (higher than perameters)   Appetite is good   Best contact: [phone]

## 2022-05-12 ENCOUNTER — Encounter: Payer: Self-pay | Admitting: Oncology

## 2022-05-12 LAB — COMPREHENSIVE METABOLIC PANEL
ALT: 16 IU/L (ref 0–44)
AST: 17 IU/L (ref 0–40)
Albumin/Globulin Ratio: 1.2 (ref 1.2–2.2)
Albumin: 3 g/dL — ABNORMAL LOW (ref 3.9–4.9)
Alkaline Phosphatase: 72 IU/L (ref 44–121)
BUN/Creatinine Ratio: 29 — ABNORMAL HIGH (ref 10–24)
BUN: 24 mg/dL (ref 8–27)
Bilirubin Total: <0.2 mg/dL (ref 0.0–1.2)
CO2: 24 mmol/L (ref 20–29)
Calcium: 7.9 mg/dL — ABNORMAL LOW (ref 8.6–10.2)
Chloride: 104 mmol/L (ref 96–106)
Creatinine, Ser: 0.84 mg/dL (ref 0.76–1.27)
Globulin, Total: 2.5 g/dL (ref 1.5–4.5)
Glucose: 157 mg/dL — ABNORMAL HIGH (ref 70–99)
Potassium: 5.4 mmol/L — ABNORMAL HIGH (ref 3.5–5.2)
Sodium: 141 mmol/L (ref 134–144)
Total Protein: 5.5 g/dL — ABNORMAL LOW (ref 6.0–8.5)
eGFR: 96 mL/min/{1.73_m2} (ref >59)

## 2022-05-12 LAB — VITAMIN B12: Vitamin B-12: 510 pg/mL (ref 232–1245)

## 2022-05-12 LAB — VITAMIN B6: Vitamin B6: 3.3 ug/L — ABNORMAL LOW (ref 3.4–65.2)

## 2022-05-12 NOTE — Progress Notes (Signed)
Contacted NP to review and update chart. Questions answered. Pt aware of appt and cancer center location.

## 2022-05-13 ENCOUNTER — Inpatient Hospital Stay: Payer: Medicare PPO

## 2022-05-13 ENCOUNTER — Inpatient Hospital Stay: Payer: Medicare PPO | Admitting: Oncology

## 2022-05-16 ENCOUNTER — Encounter: Payer: Self-pay | Admitting: Oncology

## 2022-05-16 NOTE — Progress Notes (Unsigned)
Patient contacted for New visit appt on 1/18. Chart was updated. Clinic location given to pt.

## 2022-05-17 ENCOUNTER — Inpatient Hospital Stay: Payer: Medicare PPO

## 2022-05-17 ENCOUNTER — Inpatient Hospital Stay: Payer: Medicare PPO | Admitting: Oncology

## 2022-05-20 ENCOUNTER — Telehealth: Payer: Self-pay

## 2022-05-20 NOTE — Telephone Encounter (Signed)
Copied from [place] [id number]. Topic: General - Other >> May 20, 2022 12:25 PM Cyndi Bender wrote: Reason for CRM: Lexine Baton with [facility] stated they received an order for Palliative Care but the criteria requires an active cancer diagnosis which is not listed. Cb# [phone] Option# 3

## 2022-05-21 ENCOUNTER — Other Ambulatory Visit: Payer: Self-pay | Admitting: Family Medicine

## 2022-05-24 ENCOUNTER — Inpatient Hospital Stay: Payer: Medicare PPO

## 2022-05-24 ENCOUNTER — Inpatient Hospital Stay: Payer: Medicare PPO | Attending: Oncology | Admitting: Oncology

## 2022-05-24 ENCOUNTER — Encounter: Payer: Self-pay | Admitting: Oncology

## 2022-05-24 VITALS — BP 141/69 | HR 115 | Temp 99.5°F | Resp 18 | Wt 102.2 lb

## 2022-05-24 DIAGNOSIS — D72829 Elevated white blood cell count, unspecified: Secondary | ICD-10-CM | POA: Insufficient documentation

## 2022-05-24 DIAGNOSIS — F17200 Nicotine dependence, unspecified, uncomplicated: Secondary | ICD-10-CM

## 2022-05-24 DIAGNOSIS — R630 Anorexia: Secondary | ICD-10-CM | POA: Diagnosis not present

## 2022-05-24 DIAGNOSIS — R634 Abnormal weight loss: Secondary | ICD-10-CM | POA: Diagnosis not present

## 2022-05-24 DIAGNOSIS — D75839 Thrombocytosis, unspecified: Secondary | ICD-10-CM

## 2022-05-24 DIAGNOSIS — D72828 Other elevated white blood cell count: Secondary | ICD-10-CM

## 2022-05-24 DIAGNOSIS — D649 Anemia, unspecified: Secondary | ICD-10-CM

## 2022-05-24 DIAGNOSIS — F17210 Nicotine dependence, cigarettes, uncomplicated: Secondary | ICD-10-CM | POA: Diagnosis not present

## 2022-05-24 LAB — LACTATE DEHYDROGENASE: LDH: 170 U/L (ref 98–192)

## 2022-05-24 LAB — HEPATITIS PANEL, ACUTE
HCV Ab: NONREACTIVE
Hep A IgM: NONREACTIVE
Hep B C IgM: NONREACTIVE
Hepatitis B Surface Ag: NONREACTIVE

## 2022-05-24 LAB — CBC WITH DIFFERENTIAL/PLATELET
Abs Immature Granulocytes: 0.13 10*3/uL — ABNORMAL HIGH (ref 0.00–0.07)
Basophils Absolute: 0 10*3/uL (ref 0.0–0.1)
Basophils Relative: 0 %
Eosinophils Absolute: 0 10*3/uL (ref 0.0–0.5)
Eosinophils Relative: 0 %
HCT: 29.2 % — ABNORMAL LOW (ref 39.0–52.0)
Hemoglobin: 9 g/dL — ABNORMAL LOW (ref 13.0–17.0)
Immature Granulocytes: 1 %
Lymphocytes Relative: 9 %
Lymphs Abs: 0.9 10*3/uL (ref 0.7–4.0)
MCH: 26.9 pg (ref 26.0–34.0)
MCHC: 30.8 g/dL (ref 30.0–36.0)
MCV: 87.2 fL (ref 80.0–100.0)
Monocytes Absolute: 1.1 10*3/uL — ABNORMAL HIGH (ref 0.1–1.0)
Monocytes Relative: 11 %
Neutro Abs: 8.7 10*3/uL — ABNORMAL HIGH (ref 1.7–7.7)
Neutrophils Relative %: 79 %
Platelets: 388 10*3/uL (ref 150–400)
RBC: 3.35 MIL/uL — ABNORMAL LOW (ref 4.22–5.81)
RDW: 16.5 % — ABNORMAL HIGH (ref 11.5–15.5)
WBC: 10.9 10*3/uL — ABNORMAL HIGH (ref 4.0–10.5)
nRBC: 0 % (ref 0.0–0.2)

## 2022-05-24 LAB — HEPATIC FUNCTION PANEL
ALT: 25 U/L (ref 0–44)
AST: 26 U/L (ref 15–41)
Albumin: 2.8 g/dL — ABNORMAL LOW (ref 3.5–5.0)
Alkaline Phosphatase: 80 U/L (ref 38–126)
Bilirubin, Direct: <0.1 mg/dL (ref 0.0–0.2)
Total Bilirubin: 0.4 mg/dL (ref 0.3–1.2)
Total Protein: 6.5 g/dL (ref 6.5–8.1)

## 2022-05-24 LAB — FERRITIN: Ferritin: 182 ng/mL (ref 24–336)

## 2022-05-24 LAB — RETIC PANEL
Immature Retic Fract: 8.2 % (ref 2.3–15.9)
RBC.: 3.4 MIL/uL — ABNORMAL LOW (ref 4.22–5.81)
Retic Count, Absolute: 27.6 10*3/uL (ref 19.0–186.0)
Retic Ct Pct: 0.8 % (ref 0.4–3.1)
Reticulocyte Hemoglobin: 21.3 pg — ABNORMAL LOW (ref >27.9)

## 2022-05-24 LAB — TSH: TSH: 0.703 u[IU]/mL (ref 0.350–4.500)

## 2022-05-24 LAB — TECHNOLOGIST SMEAR REVIEW: Plt Morphology: ADEQUATE

## 2022-05-24 LAB — IRON AND TIBC
Iron: 13 ug/dL — ABNORMAL LOW (ref 45–182)
Saturation Ratios: 6 % — ABNORMAL LOW (ref 17.9–39.5)
TIBC: 237 ug/dL — ABNORMAL LOW (ref 250–450)
UIBC: 224 ug/dL

## 2022-05-24 NOTE — Progress Notes (Signed)
Hematology/Oncology Consult note [phone] [phone]     REFERRING PROVIDER: Birdie Sons, MD   CHIEF COMPLAINTS/REASON FOR VISIT:  Evaluation of anemia, leukocytosis  ASSESSMENT & PLAN:   Leucocytosis Possibly reactive due to chronic smoking.  Rule out other etiologies. Previous labs were reviewed and discussed with patient.  Check CBC, smear, LDH, peripheral blood flow cytometry, monoclonal gammopathy evaluation, hepatitis panel, JAK2 mutation with reflex, BCR-ABL 1 FISH  Anemia Check iron, TIBC ferritin, he has had B12 level rechecked, levels were within normal limits.  Tobacco dependence He is not interested in smoke cessation.   Orders Placed This Encounter  Procedures   Ferritin    Standing Status:   Future    Number of Occurrences:   1    Standing Expiration Date:   11/22/2022   Iron and TIBC    Standing Status:   Future    Number of Occurrences:   1    Standing Expiration Date:   05/25/2023   CBC with Differential/Platelet    Standing Status:   Future    Number of Occurrences:   1    Standing Expiration Date:   05/25/2023   Retic Panel    Standing Status:   Future    Number of Occurrences:   1    Standing Expiration Date:   05/25/2023   Multiple Myeloma Panel (SPEP&IFE w/QIG)    Standing Status:   Future    Number of Occurrences:   1    Standing Expiration Date:   05/25/2023   Kappa/lambda light chains    Standing Status:   Future    Number of Occurrences:   1    Standing Expiration Date:   05/25/2023   Flow cytometry panel-leukemia/lymphoma work-up    Standing Status:   Future    Number of Occurrences:   1    Standing Expiration Date:   05/25/2023   Lactate dehydrogenase    Standing Status:   Future    Number of Occurrences:   1    Standing Expiration Date:   05/25/2023   TSH    Standing Status:   Future    Number of Occurrences:   1    Standing Expiration Date:   05/25/2023   Hepatic function panel    Standing Status:    Future    Number of Occurrences:   1    Standing Expiration Date:   05/24/2023   Hepatitis panel, acute    Standing Status:   Future    Number of Occurrences:   1    Standing Expiration Date:   05/25/2023   Technologist smear review    Standing Status:   Future    Number of Occurrences:   1    Standing Expiration Date:   05/25/2023    Order Specific Question:   Clinical information:    Answer:   leukocytosis, anemia,thrombocytosis   JAK2 V617F rfx CALR/MPL/E12-15    Standing Status:   Future    Number of Occurrences:   1    Standing Expiration Date:   05/25/2023   BCR-ABL1 FISH    Standing Status:   Future    Number of Occurrences:   1    Standing Expiration Date:   05/25/2023   Follow-up in 3 to 4 weeks to review results. All questions were answered. The patient knows to call the clinic with any problems, questions or concerns.  Earlie Server, MD, PhD [facility] Hematology Oncology 05/24/2022  HISTORY OF PRESENTING ILLNESS:   Lucas Ramirez is a  67 y.o.  male with PMH listed below was seen in consultation at the request of  Birdie Sons, MD  for evaluation of anemia, leukocytosis  Patient is accompanied by his wife.  Patient has extensive past medical history including everyday smoker, chronic interstitial lung disease, scoliosis, back pain, neuropathy, chronic debility, Bell's palsy, hypertension, hyperlipidemia, GERD, failure to thrive/cachexia, recent stroke in December 2023. He also has a history of C. difficile diarrhea which was treated with a course of antibiotics.  Currently no diarrhea.  Leukocytosis, intermittently chronic since April 2023.  Predominantly neutrophilia, monocytosis. Anemia is chronic since April 2024.  Progressively getting worse.  He also has a history of thrombocytosis since April 2023. Patient has lost weight, appetite is very poor.  He is wheelchair bound.  Poor historian.  History was obtained primarily from wife.   MEDICAL HISTORY:  Past Medical  History:  Diagnosis Date   Bell's palsy    CAP (community acquired pneumonia) 06/28/2015   CVA (cerebral vascular accident) ([place])    Depression    GERD (gastroesophageal reflux disease)    Glaucoma    Hypertension    Migraine    Scoliosis    Shingles 2019   dx by dermatologist by patient report    SURGICAL HISTORY: Past Surgical History:  Procedure Laterality Date   [place]   correcting Scoliosils per pt   COLONOSCOPY WITH PROPOFOL N/A 07/04/2019   Procedure: COLONOSCOPY WITH PROPOFOL;  Surgeon: Lin Landsman, MD;  Location: [facility];  Service: Gastroenterology;  Laterality: N/A;  PRIORITY 4   LUMBAR LAMINECTOMY/ DECOMPRESSION WITH MET-RX Right 10/30/2019   Procedure: L4-5 DECOMPRESSION;  Surgeon: Meade Maw, MD;  Location: [facility];  Service: Neurosurgery;  Laterality: Right;   SKIN LESION EXCISION  06/06/2013   facial left cheek. Dr. Evorn Gong   UPPER GI ENDOSCOPY  07/11/2008   [place]. Dr. Donnella Sham. Impression. -LA Grade C reflux esophagitis. 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    SOCIAL HISTORY: Social History   Socioeconomic History   Marital status: Married    Spouse name: Not on file   Number of children: Not on file   Years of education: Not on file   Highest education level: Not on file  Occupational History   Not on file  Tobacco Use   Smoking status: Every Day    Packs/day: 2.00    Years: 35.00    Total pack years: 70.00    Types: Cigarettes   Smokeless tobacco: Never  Vaping Use   Vaping Use: Former  Substance and Sexual Activity   Alcohol use: No    Comment: has not had a drink in 30 year   Drug use: No   Sexual activity: Yes  Other Topics Concern   Not on file  Social History Narrative   Lives at home with wife.   Social Determinants of Health   Financial Resource Strain: Low Risk  (01/24/2022)   Overall Financial Resource Strain (CARDIA)    Difficulty of Paying Living Expenses: Not hard  at all  Food Insecurity: No Food Insecurity (05/24/2022)   Hunger Vital Sign    Worried About Running Out of Food in the Last Year: Never true    Ran Out of Food in the Last Year: Never true  Transportation Needs: No Transportation Needs (05/24/2022)   PRAPARE - Hydrologist (Medical): No    Lack  of Transportation (Non-Medical): No  Physical Activity: Inactive (01/24/2022)   Exercise Vital Sign    Days of Exercise per Week: 0 days    Minutes of Exercise per Session: 0 min  Stress: No Stress Concern Present (01/24/2022)   [place]    Feeling of Stress : Not at all  Social Connections: Moderately Isolated (01/24/2022)   Social Connection and Isolation Panel [NHANES]    Frequency of Communication with Friends and Family: More than three times a week    Frequency of Social Gatherings with Friends and Family: Twice a week    Attends Religious Services: Never    Marine scientist or Organizations: No    Attends Archivist Meetings: Never    Marital Status: Married  Human resources officer Violence: Unknown (05/24/2022)   Humiliation, Afraid, Rape, and Kick questionnaire    Fear of Current or Ex-Partner: Patient refused    Emotionally Abused: Patient refused    Physically Abused: Patient refused    Sexually Abused: Patient refused    FAMILY HISTORY: Family History  Problem Relation Age of Onset   Diabetes Mother    Emphysema Mother    Down syndrome Sister    CAD Neg Hx    Heart disease Neg Hx    Colon cancer Neg Hx    Prostate cancer Neg Hx     ALLERGIES:  is allergic to bupropion hcl.  MEDICATIONS:  Current Outpatient Medications  Medication Sig Dispense Refill   acetaminophen (TYLENOL) 500 MG tablet Take 1 tablet (500 mg total) by mouth every 6 (six) hours as needed for mild pain. 30 tablet 0   albuterol (VENTOLIN HFA) 108 (90 Base) MCG/ACT inhaler Inhale 2 puffs into the  lungs every 6 (six) hours as needed for wheezing or shortness of breath. 8 g 2   aspirin EC 81 MG tablet Take 1 tablet (81 mg total) by mouth daily. Swallow whole.     calcium carbonate (OSCAL) 1500 (600 Ca) MG TABS tablet Take 600 mg of elemental calcium by mouth 2 (two) times daily with a meal.     cholecalciferol (VITAMIN D) 1000 UNITS tablet Take 1,000 Units by mouth daily.     clopidogrel (PLAVIX) 75 MG tablet TAKE 1 TABLET BY MOUTH ONCE DAILY (Patient taking differently: Take 75 mg by mouth at bedtime.) 30 tablet 11   cyproheptadine (PERIACTIN) 4 MG tablet Take 1 tablet (4 mg total) by mouth 3 (three) times daily as needed (appetite). 30 tablet 0   denosumab (PROLIA) 60 MG/ML SOSY injection Inject 60 mg into the skin every 6 (six) months.     ferrous sulfate 325 (65 FE) MG tablet Take 1 tablet (325 mg total) by mouth every other day. 60 tablet 0   furosemide (LASIX) 20 MG tablet Take 1 tablet (20 mg total) by mouth daily. 30 tablet 3   gabapentin (NEURONTIN) 800 MG tablet TAKE 1 TABLET BY MOUTH 3 TIMES DAILY AS NEEDED AS DIRECTED 270 tablet 4   latanoprost (XALATAN) 0.005 % ophthalmic solution Place 1-2 drops into both eyes See admin instructions. Instill 1 drop into left eye at bedtime Instill 2 drops into right eye at bedtime     lisinopril (ZESTRIL) 20 MG tablet TAKE 1 TABLET BY MOUTH ONCE DAILY 90 tablet 1   mirtazapine (REMERON) 15 MG tablet TAKE 1/2-1 TABLET BY MOUTH AT BEDTIME 30 tablet 5   omeprazole (PRILOSEC) 20 MG capsule TAKE 1 CAPSULE BY MOUTH  ONCE DAILY 30 capsule 11   oxyCODONE (OXY IR/ROXICODONE) 5 MG immediate release tablet TAKE 1 TABLET BY MOUTH EVERY 4 HOURS 180 tablet 0   rosuvastatin (CRESTOR) 20 MG tablet TAKE 1 TABLET BY MOUTH AT BEDTIME 90 tablet 3   megestrol (MEGACE) 400 MG/10ML suspension Take 20 mLs (800 mg total) by mouth daily. 600 mL 0   No current facility-administered medications for this visit.    Review of Systems  Constitutional:  Positive for  appetite change, fatigue and unexpected weight change. Negative for chills and fever.  HENT:   Negative for hearing loss and voice change.   Eyes:  Negative for icterus.  Respiratory:  Positive for shortness of breath. Negative for chest tightness and cough.   Cardiovascular:  Positive for leg swelling. Negative for chest pain.  Gastrointestinal:  Negative for abdominal distention and abdominal pain.  Genitourinary:  Negative for dysuria and frequency.   Musculoskeletal:  Positive for arthralgias and back pain.  Skin:  Negative for itching and rash.  Neurological:  Positive for extremity weakness. Negative for light-headedness and numbness.  Hematological:  Negative for adenopathy. Does not bruise/bleed easily.  Psychiatric/Behavioral:  Negative for confusion.    PHYSICAL EXAMINATION: ECOG PERFORMANCE STATUS: 3 - Symptomatic, >50% confined to bed Vitals:   05/24/22 1051  BP: (!) 141/69  Pulse: (!) 115  Resp: 18  Temp: 99.5 F (37.5 C)   Filed Weights   05/24/22 1051  Weight: 102 lb 3.2 oz (46.4 kg)    Physical Exam Constitutional:      General: He is not in acute distress.    Appearance: He is ill-appearing.     Comments: Patient sits in the wheelchair  HENT:     Head: Normocephalic and atraumatic.  Eyes:     General: No scleral icterus. Cardiovascular:     Rate and Rhythm: Normal rate.  Pulmonary:     Effort: Pulmonary effort is normal. No respiratory distress.     Comments: Severely decreased breath sounds bilaterally Abdominal:     General: There is no distension.     Palpations: Abdomen is soft.  Musculoskeletal:        General: No deformity. Normal range of motion.     Cervical back: Normal range of motion and neck supple.     Comments: Scoliosis  Skin:    General: Skin is warm and dry.     Findings: No erythema or rash.  Neurological:     Mental Status: He is alert. Mental status is at baseline.  Psychiatric:        Mood and Affect: Mood normal.      LABORATORY DATA:  I have reviewed the data as listed    Latest Ref Rng & Units 05/24/2022   11:21 AM 04/21/2022    9:39 AM 04/20/2022    6:30 AM  CBC  WBC 4.0 - 10.5 K/uL 10.9  12.8  15.1   Hemoglobin 13.0 - 17.0 g/dL 9.0  9.2  8.7   Hematocrit 39.0 - 52.0 % 29.2  28.0  26.4   Platelets 150 - 400 K/uL 388  706  669       Latest Ref Rng & Units 05/24/2022   11:21 AM 05/06/2022    1:31 PM 04/21/2022    9:39 AM  CMP  Glucose 70 - 99 mg/dL  157  151   BUN 8 - 27 mg/dL  24  30   Creatinine 0.76 - 1.27 mg/dL  0.84  0.80  Sodium 134 - 144 mmol/L  141  133   Potassium 3.5 - 5.2 mmol/L  5.4  4.6   Chloride 96 - 106 mmol/L  104  97   CO2 20 - 29 mmol/L  24  28   Calcium 8.6 - 10.2 mg/dL  7.9  8.0   Total Protein 6.5 - 8.1 g/dL 6.5  5.5    Total Bilirubin 0.3 - 1.2 mg/dL 0.4  <0.2    Alkaline Phos 38 - 126 U/L 80  72    AST 15 - 41 U/L 26  17    ALT 0 - 44 U/L 25  16        RADIOGRAPHIC STUDIES: I have personally reviewed the radiological images as listed and agreed with the findings in the report. ECHOCARDIOGRAM COMPLETE  Result Date: 04/10/2022    ECHOCARDIOGRAM REPORT   Patient Name:   Lucas Ramirez Date of Exam: 04/10/2022 Medical Rec #:  [id number]     Height:       67.0 in Accession #:    [id number]    Weight:       110.4 lb Date of Birth:  November 04, 1955     BSA:          1.571 m Patient Age:    66 years      BP:           150/79 mmHg Patient Gender: M             HR:           107 bpm. Exam Location:  [facility] Procedure: 2D Echo Indications:     Abnormal ECG R94.31  History:         Patient has prior history of Echocardiogram examinations, most                  recent 08/20/2020.  Sonographer:     Kathlen Brunswick RDCS Referring Phys:  [id number] Barb Merino Diagnosing Phys: Ida Rogue MD  Sonographer Comments: Technically challenging study due to limited acoustic windows, no parasternal window and no apical window. Image acquisition challenging due to patient body habitus and  Image acquisition challenging due to respiratory motion. IMPRESSIONS  1. Left ventricular ejection fraction, by estimation, is 60 to 65%. The left ventricle has normal function. The left ventricle has no regional wall motion abnormalities. Left ventricular diastolic parameters are consistent with Grade I diastolic dysfunction (impaired relaxation).  2. Right ventricular systolic function is normal. The right ventricular size is normal. Tricuspid regurgitation signal is inadequate for assessing PA pressure.  3. A small pericardial effusion is present.  4. The mitral valve is normal in structure. Mild mitral valve regurgitation. No evidence of mitral stenosis.  5. The aortic valve has an indeterminant number of cusps. Aortic valve regurgitation is not visualized. Aortic valve sclerosis is present, with no evidence of aortic valve stenosis.  6. The inferior vena cava is normal in size with greater than 50% respiratory variability, suggesting right atrial pressure of 3 mmHg. FINDINGS  Left Ventricle: Left ventricular ejection fraction, by estimation, is 60 to 65%. The left ventricle has normal function. The left ventricle has no regional wall motion abnormalities. The left ventricular internal cavity size was normal in size. There is  no left ventricular hypertrophy. Left ventricular diastolic parameters are consistent with Grade I diastolic dysfunction (impaired relaxation). Right Ventricle: The right ventricular size is normal. No increase in right ventricular wall thickness. Right ventricular systolic function is normal. Tricuspid  regurgitation signal is inadequate for assessing PA pressure. Left Atrium: Left atrial size was normal in size. Right Atrium: Right atrial size was normal in size. Pericardium: A small pericardial effusion is present. Mitral Valve: The mitral valve is normal in structure. There is mild calcification of the mitral valve leaflet(s). Mild mitral valve regurgitation. No evidence of mitral valve  stenosis. Tricuspid Valve: The tricuspid valve is normal in structure. Tricuspid valve regurgitation is mild . No evidence of tricuspid stenosis. Aortic Valve: The aortic valve has an indeterminant number of cusps. Aortic valve regurgitation is not visualized. Aortic valve sclerosis is present, with no evidence of aortic valve stenosis. Aortic valve peak gradient measures 10.2 mmHg. Pulmonic Valve: The pulmonic valve was normal in structure. Pulmonic valve regurgitation is not visualized. No evidence of pulmonic stenosis. Aorta: The aortic root is normal in size and structure. Venous: The inferior vena cava is normal in size with greater than 50% respiratory variability, suggesting right atrial pressure of 3 mmHg. IAS/Shunts: No atrial level shunt detected by color flow Doppler.  AORTIC VALVE              PULMONIC VALVE AV Vmax:      160.00 cm/s PV Vmax:       1.13 m/s AV Peak Grad: 10.2 mmHg   PV Peak grad:  5.1 mmHg LVOT Vmax:    79.00 cm/s LVOT Vmean:   57.100 cm/s LVOT VTI:     0.132 m MITRAL VALVE MV Area (PHT): 8.92 cm    SHUNTS MV Decel Time: 85 msec     Systemic VTI: 0.13 m MV E velocity: 51.90 cm/s MV A velocity: 90.10 cm/s MV E/A ratio:  0.58 Ida Rogue MD Electronically signed by Ida Rogue MD Signature Date/Time: 04/10/2022/3:04:04 PM    Final    CT ANGIO HEAD NECK W WO CM  Result Date: 04/10/2022 CLINICAL DATA:  67 year old male with altered mental status presentation yesterday. Right MCA middle and superior frontal gyrus infarct. Severe proximal right ICA stenosis on CTA earlier this year. 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 EXAM: CT ANGIOGRAPHY HEAD AND NECK TECHNIQUE: Multidetector CT imaging of the head and neck was performed using the standard protocol during bolus administration of intravenous contrast. Multiplanar CT image reconstructions and MIPs were obtained to evaluate the vascular anatomy. Carotid stenosis measurements (when applicable) are obtained  utilizing NASCET criteria, using the distal internal carotid diameter as the denominator. RADIATION DOSE REDUCTION: This exam was performed according to the departmental dose-optimization program which includes automated exposure control, adjustment of the mA and/or kV according to patient size and/or use of iterative reconstruction technique. CONTRAST:  20m OMNIPAQUE IOHEXOL 350 MG/ML SOLN COMPARISON:  Head CT and brain MRI yesterday. CTA head and neck 08/26/2021. FINDINGS: CT HEAD Brain: Cytotoxic edema in the middle right MCA territory corresponding to the MRI diffusion abnormality yesterday appears unchanged by CT, no hemorrhage or mass effect. Small chronic lacunar infarct left caudate nucleus again noted. Small left cerebellar chronic lacunar infarct. Mild for age patchy cerebral white matter hypodensity in both hemispheres. Elsewhere normal gray-white differentiation. No midline shift, mass effect, or evidence of intracranial mass lesion. No ventriculomegaly. No new intracranial abnormality. Calvarium and skull base: No acute osseous abnormality identified. Possible small area of benign fibrous dysplasia at the right skull base series 7, image 23. Paranasal sinuses: Visualized paranasal sinuses and mastoids are clear. Orbits: Visualized orbits and scalp soft tissues are within normal limits. CTA NECK Skeleton: Stable mild degenerative cervical  spondylolisthesis at C3. Partially visible thoracic scoliosis. No acute osseous abnormality identified. Upper chest: Regressed but not completely resolved right pleural effusion seen in May. Paraseptal emphysema. Visible central pulmonary arteries are patent. Negative visible superior mediastinum. Other neck: No acute neck soft tissue finding. Aortic arch: Calcified aortic atherosclerosis. Tortuous arch with 3 vessel configuration. Right carotid system: Only mild brachiocephalic artery plaque. Tortuous proximal right CCA. No plaque or stenosis before the bifurcation.  Chronic right ICA near occlusion beginning at the origin and bulb, where only string sign enhancement remains visible (series 16, image 64) and since May the downstream right ICA caliber is reduced, resulting in functionally occluded vessel which is only faint with thread-like enhancement below the skull base (series 16, image 64). Left carotid system: Left CCA origin atherosclerosis is stable with less than 50% stenosis. Mild left carotid bifurcation atherosclerosis without stenosis and the left ICA remains patent to the skull base. Vertebral arteries: Proximal right subclavian artery is patent without stenosis. But there is severe stenosis at the right vertebral artery origin due to combined soft and calcified plaque on series 14, image 161. This is stable. Right vertebral remains patent with a prominent caliber to the skull base. Chronic occlusion left vertebral artery origin with stable thread-like reconstitution beginning in the V2 segment. And vessel reocclusion at the skull base. CTA HEAD Posterior circulation: Right vertebral artery remains patent and the sulcal apply to the basilar. Left PICA appears reconstituted on series 2, image 141. Right PICA origin is patent. No significant right vertebral or basilar artery stenosis. Patent basilar tip, SCA and PCA origins. Bilateral PCA branches are within normal limits. Anterior circulation: Occluded right ICA siphon now, with reconstitution in the right cavernous segment. Right ICA terminus, right MCA and ACA origins remain patent with a fairly stable caliber since May (series 15, image 23). Left ICA siphon is patent with moderate supraclinoid calcified plaque and stenosis on series 14, image 106. This is stable to progressed since May. Left ICA terminus remains patent. Left MCA and ACA origin remain patent. Diminutive or absent anterior communicating artery. Symmetric bilateral ACA branch enhancement, stable since May. Left MCA M1 segment and bifurcation are  patent without stenosis. Left MCA branches are stable with mild irregularity. Right MCA M1 segment remains patent to the bifurcation or trifurcation without stenosis. And there is no M2 branch occlusion identified. However, new tandem moderate to severe stenoses of the dominant posterior M2 on series 17, image 10. Venous sinuses: Early contrast timing, not well evaluated. Anatomic variants: None significant. Review of the MIP images confirms the above findings IMPRESSION: 1. Positive CTA: - Right ICA origin chronic STRING-SIGN-STENOSIS and since May has Effectively Occluded In The Neck. Reconstituted Right siphon beginning in the cavernous segment. - Right MCA and ACA remain patent, but with new Moderate To Severe posterior Right M2 stenoses since May. - Up to Moderate contralateral Left ICA siphon supraclinoid stenosis due to calcified plaque. - Chronic Left vertebral artery occlusion, unchanged. - Severe stenosis at the Right Vertebral Artery origin. 2. Stable Right MCA infarct since yesterday. No hemorrhage or mass effect. 3. Regressed right apical pleural effusions since May. Emphysema (ICD10-J43.9). Electronically Signed   By: Genevie Ann M.D.   On: 04/10/2022 10:51   MR BRAIN WO CONTRAST  Result Date: 04/09/2022 CLINICAL DATA:  Altered mental status EXAM: MRI HEAD WITHOUT CONTRAST TECHNIQUE: Multiplanar, multiecho pulse sequences of the brain and surrounding structures were obtained without intravenous contrast. COMPARISON:  None Available. FINDINGS: Brain: Medium-sized  acute infarct within the right frontal lobe. No other area of acute ischemia. No acute or chronic hemorrhage. Normal white matter signal, parenchymal volume and CSF spaces. The midline structures are normal. Vascular: Major flow voids are preserved. Skull and upper cervical spine: Normal calvarium and skull base. Visualized upper cervical spine and soft tissues are normal. Sinuses/Orbits:No paranasal sinus fluid levels or advanced mucosal  thickening. No mastoid or middle ear effusion. Normal orbits. IMPRESSION: Medium-sized acute infarct within the right frontal lobe. No hemorrhage or mass effect. Electronically Signed   By: Ulyses Jarred M.D.   On: 04/09/2022 22:28   CT Angio Chest PE W and/or Wo Contrast  Result Date: 04/09/2022 CLINICAL DATA:  Pulmonary embolism. Scratched at pulmonary embolism suspected, high probability. Shortness of breath since yesterday. Tachycardia. EXAM: CT ANGIOGRAPHY CHEST WITH CONTRAST TECHNIQUE: Multidetector CT imaging of the chest was performed using the standard protocol during bolus administration of intravenous contrast. Multiplanar CT image reconstructions and MIPs were obtained to evaluate the vascular anatomy. RADIATION DOSE REDUCTION: This exam was performed according to the departmental dose-optimization program which includes automated exposure control, adjustment of the mA and/or kV according to patient size and/or use of iterative reconstruction technique. CONTRAST:  82m OMNIPAQUE IOHEXOL 350 MG/ML SOLN COMPARISON:  Two-view chest x-ray 04/09/2022 CTA chest 08/15/2021 FINDINGS: Cardiovascular: Heart size is normal. Atherosclerotic calcifications are present at the aortic arch and great vessel origins without significant stenosis or aneurysm. Pulmonary artery opacification is excellent. No focal filling defects are present to suggest pulmonary emboli. Mediastinum/Nodes: No enlarged mediastinal, hilar, or axillary lymph nodes. Thyroid gland, trachea, and esophagus demonstrate no significant findings. Lungs/Pleura: Chronic peripheral honeycombing is present anteriorly in the left upper lobe and in the posterior left lower lobe. Scarring is present the right lung base. Chronic right pleural thickening and calcifications are present. Chronic scarring is present. No superimposed acute disease is present. Airways are patent. Upper Abdomen: Chronic pancreatic calcifications are compatible with remote  pancreatitis. Right renal atrophy again noted. No acute abnormalities are present. Musculoskeletal: Exaggerated thoracic kyphosis with marked rightward curvature again noted. Diffuse right lateral segments present in the lower thoracic and lumbar spine, likely congenital. Review of the MIP images confirms the above findings. IMPRESSION: 1. No pulmonary embolus. 2. Chronic peripheral honeycombing anteriorly in the left upper lobe and in the posterior left lower lobe compatible with chronic interstitial lung disease. 3. Chronic right pleural thickening and calcifications. 4. No acute superimposed pulmonary disease. 5. Chronic pancreatic calcifications compatible with remote pancreatitis. 6.  Aortic Atherosclerosis (ICD10-I70.0). Electronically Signed   By: CSan MorelleM.D.   On: 04/09/2022 16:55   CT HEAD WO CONTRAST (5MM)  Result Date: 04/09/2022 CLINICAL DATA:  Mental status change, unknown cause EXAM: CT HEAD WITHOUT CONTRAST TECHNIQUE: Contiguous axial images were obtained from the base of the skull through the vertex without intravenous contrast. RADIATION DOSE REDUCTION: This exam was performed according to the departmental dose-optimization program which includes automated exposure control, adjustment of the mA and/or kV according to patient size and/or use of iterative reconstruction technique. COMPARISON:  08/26/2021 FINDINGS: Brain: New low-density changes within the high right frontoparietal region with loss of gray-white differentiation. No evidence hemorrhage, hydrocephalus, extra-axial collection, or mass. Vascular: Atherosclerotic calcifications involving the large vessels of the skull base. No unexpected hyperdense vessel. Skull: Normal. Negative for fracture or focal lesion. Sinuses/Orbits: No acute finding. Other: None. IMPRESSION: New low-density changes within the high right frontoparietal region with loss of gray-white differentiation, suggestive of a acute  or subacute infarct.  Further evaluation with MRI is recommended. Electronically Signed   By: Davina Poke D.O.   On: 04/09/2022 16:50   DG Chest 2 View  Result Date: 04/09/2022 CLINICAL DATA:  Shortness of breath EXAM: CHEST - 2 VIEW COMPARISON:  Chest x-ray Aug 26, 2021 FINDINGS: The cardiomediastinal silhouette is unchanged in contour. Stable right lung base and pleural opacity, likely scarring/pleural thickening. Stable left basilar bandlike opacities, likely scarring. No acute pulmonary opacity. No large pneumothorax. The visualized upper abdomen is unremarkable. Severe thoracolumbar scoliosis. No acute osseous abnormality. IMPRESSION: No acute cardiopulmonary abnormality. Stable chronic changes in bibasilar lungs. Electronically Signed   By: Beryle Flock M.D.   On: 04/09/2022 13:59

## 2022-05-24 NOTE — Assessment & Plan Note (Addendum)
Possibly reactive due to chronic smoking.  Rule out other etiologies. Previous labs were reviewed and discussed with patient.  Check CBC, smear, LDH, peripheral blood flow cytometry, monoclonal gammopathy evaluation, hepatitis panel, JAK2 mutation with reflex, BCR-ABL 1 FISH 

## 2022-05-24 NOTE — Assessment & Plan Note (Signed)
He is not interested in smoke cessation.

## 2022-05-24 NOTE — Assessment & Plan Note (Signed)
Check iron, TIBC ferritin, he has had B12 level rechecked, levels were within normal limits.

## 2022-05-25 ENCOUNTER — Telehealth: Payer: Self-pay

## 2022-05-25 LAB — KAPPA/LAMBDA LIGHT CHAINS
Kappa free light chain: 85.2 mg/L — ABNORMAL HIGH (ref 3.3–19.4)
Kappa, lambda light chain ratio: 1.89 — ABNORMAL HIGH (ref 0.26–1.65)
Lambda free light chains: 45 mg/L — ABNORMAL HIGH (ref 5.7–26.3)

## 2022-05-25 NOTE — Telephone Encounter (Signed)
Initial palliative care visit/consult scheduled for wed 2/7 '@11am'$  per patient request to determine PC eligibility. Confirmed with patient.

## 2022-05-26 ENCOUNTER — Inpatient Hospital Stay: Payer: Medicare PPO

## 2022-05-26 ENCOUNTER — Inpatient Hospital Stay
Admission: EM | Admit: 2022-05-26 | Discharge: 2022-06-03 | DRG: 177 | Disposition: A | Discharge status: Hospice | Payer: Medicare PPO | Attending: Student | Admitting: Student

## 2022-05-26 ENCOUNTER — Emergency Department: Payer: Medicare PPO

## 2022-05-26 ENCOUNTER — Other Ambulatory Visit: Payer: Self-pay

## 2022-05-26 DIAGNOSIS — F17200 Nicotine dependence, unspecified, uncomplicated: Secondary | ICD-10-CM | POA: Diagnosis present

## 2022-05-26 DIAGNOSIS — G43909 Migraine, unspecified, not intractable, without status migrainosus: Secondary | ICD-10-CM | POA: Diagnosis present

## 2022-05-26 DIAGNOSIS — L89152 Pressure ulcer of sacral region, stage 2: Secondary | ICD-10-CM | POA: Diagnosis present

## 2022-05-26 DIAGNOSIS — R040 Epistaxis: Secondary | ICD-10-CM | POA: Diagnosis not present

## 2022-05-26 DIAGNOSIS — I63519 Cerebral infarction due to unspecified occlusion or stenosis of unspecified middle cerebral artery: Secondary | ICD-10-CM | POA: Diagnosis present

## 2022-05-26 DIAGNOSIS — Z7902 Long term (current) use of antithrombotics/antiplatelets: Secondary | ICD-10-CM

## 2022-05-26 DIAGNOSIS — J849 Interstitial pulmonary disease, unspecified: Secondary | ICD-10-CM | POA: Diagnosis not present

## 2022-05-26 DIAGNOSIS — M4135 Thoracogenic scoliosis, thoracolumbar region: Secondary | ICD-10-CM | POA: Diagnosis not present

## 2022-05-26 DIAGNOSIS — I639 Cerebral infarction, unspecified: Secondary | ICD-10-CM | POA: Diagnosis not present

## 2022-05-26 DIAGNOSIS — Z515 Encounter for palliative care: Secondary | ICD-10-CM

## 2022-05-26 DIAGNOSIS — U071 COVID-19: Principal | ICD-10-CM | POA: Diagnosis present

## 2022-05-26 DIAGNOSIS — M419 Scoliosis, unspecified: Secondary | ICD-10-CM | POA: Diagnosis present

## 2022-05-26 DIAGNOSIS — R0689 Other abnormalities of breathing: Secondary | ICD-10-CM | POA: Diagnosis not present

## 2022-05-26 DIAGNOSIS — J9601 Acute respiratory failure with hypoxia: Secondary | ICD-10-CM | POA: Diagnosis not present

## 2022-05-26 DIAGNOSIS — G629 Polyneuropathy, unspecified: Secondary | ICD-10-CM | POA: Diagnosis present

## 2022-05-26 DIAGNOSIS — Z743 Need for continuous supervision: Secondary | ICD-10-CM | POA: Diagnosis not present

## 2022-05-26 DIAGNOSIS — F17210 Nicotine dependence, cigarettes, uncomplicated: Secondary | ICD-10-CM | POA: Diagnosis present

## 2022-05-26 DIAGNOSIS — G510 Bell's palsy: Secondary | ICD-10-CM | POA: Diagnosis present

## 2022-05-26 DIAGNOSIS — R64 Cachexia: Secondary | ICD-10-CM | POA: Diagnosis present

## 2022-05-26 DIAGNOSIS — R000 Tachycardia, unspecified: Secondary | ICD-10-CM | POA: Diagnosis not present

## 2022-05-26 DIAGNOSIS — G9341 Metabolic encephalopathy: Secondary | ICD-10-CM | POA: Diagnosis not present

## 2022-05-26 DIAGNOSIS — Z79899 Other long term (current) drug therapy: Secondary | ICD-10-CM

## 2022-05-26 DIAGNOSIS — I110 Hypertensive heart disease with heart failure: Secondary | ICD-10-CM | POA: Diagnosis present

## 2022-05-26 DIAGNOSIS — F32A Depression, unspecified: Secondary | ICD-10-CM | POA: Diagnosis present

## 2022-05-26 DIAGNOSIS — J441 Chronic obstructive pulmonary disease with (acute) exacerbation: Secondary | ICD-10-CM | POA: Diagnosis not present

## 2022-05-26 DIAGNOSIS — Z681 Body mass index (BMI) 19 or less, adult: Secondary | ICD-10-CM

## 2022-05-26 DIAGNOSIS — J9811 Atelectasis: Secondary | ICD-10-CM | POA: Diagnosis not present

## 2022-05-26 DIAGNOSIS — E43 Unspecified severe protein-calorie malnutrition: Secondary | ICD-10-CM | POA: Diagnosis not present

## 2022-05-26 DIAGNOSIS — Z7982 Long term (current) use of aspirin: Secondary | ICD-10-CM

## 2022-05-26 DIAGNOSIS — Z8673 Personal history of transient ischemic attack (TIA), and cerebral infarction without residual deficits: Secondary | ICD-10-CM

## 2022-05-26 DIAGNOSIS — J440 Chronic obstructive pulmonary disease with acute lower respiratory infection: Secondary | ICD-10-CM | POA: Diagnosis present

## 2022-05-26 DIAGNOSIS — G8929 Other chronic pain: Secondary | ICD-10-CM | POA: Diagnosis present

## 2022-05-26 DIAGNOSIS — J90 Pleural effusion, not elsewhere classified: Secondary | ICD-10-CM | POA: Diagnosis present

## 2022-05-26 DIAGNOSIS — D509 Iron deficiency anemia, unspecified: Secondary | ICD-10-CM | POA: Diagnosis present

## 2022-05-26 DIAGNOSIS — K219 Gastro-esophageal reflux disease without esophagitis: Secondary | ICD-10-CM | POA: Diagnosis present

## 2022-05-26 DIAGNOSIS — I959 Hypotension, unspecified: Secondary | ICD-10-CM | POA: Diagnosis not present

## 2022-05-26 DIAGNOSIS — Z66 Do not resuscitate: Secondary | ICD-10-CM | POA: Diagnosis not present

## 2022-05-26 DIAGNOSIS — J1282 Pneumonia due to coronavirus disease 2019: Secondary | ICD-10-CM | POA: Diagnosis not present

## 2022-05-26 DIAGNOSIS — I5032 Chronic diastolic (congestive) heart failure: Secondary | ICD-10-CM | POA: Diagnosis present

## 2022-05-26 DIAGNOSIS — R0902 Hypoxemia: Secondary | ICD-10-CM | POA: Diagnosis not present

## 2022-05-26 DIAGNOSIS — J9690 Respiratory failure, unspecified, unspecified whether with hypoxia or hypercapnia: Secondary | ICD-10-CM | POA: Diagnosis not present

## 2022-05-26 DIAGNOSIS — Z825 Family history of asthma and other chronic lower respiratory diseases: Secondary | ICD-10-CM

## 2022-05-26 DIAGNOSIS — I10 Essential (primary) hypertension: Secondary | ICD-10-CM | POA: Diagnosis present

## 2022-05-26 DIAGNOSIS — M5441 Lumbago with sciatica, right side: Secondary | ICD-10-CM | POA: Diagnosis not present

## 2022-05-26 DIAGNOSIS — R051 Acute cough: Secondary | ICD-10-CM

## 2022-05-26 DIAGNOSIS — Z8279 Family history of other congenital malformations, deformations and chromosomal abnormalities: Secondary | ICD-10-CM

## 2022-05-26 DIAGNOSIS — Z833 Family history of diabetes mellitus: Secondary | ICD-10-CM

## 2022-05-26 DIAGNOSIS — J159 Unspecified bacterial pneumonia: Secondary | ICD-10-CM | POA: Diagnosis present

## 2022-05-26 DIAGNOSIS — Z9889 Other specified postprocedural states: Secondary | ICD-10-CM

## 2022-05-26 DIAGNOSIS — H409 Unspecified glaucoma: Secondary | ICD-10-CM | POA: Diagnosis present

## 2022-05-26 LAB — CBC WITH DIFFERENTIAL/PLATELET
Abs Immature Granulocytes: 0.09 10*3/uL — ABNORMAL HIGH (ref 0.00–0.07)
Basophils Absolute: 0 10*3/uL (ref 0.0–0.1)
Basophils Relative: 0 %
Eosinophils Absolute: 0 10*3/uL (ref 0.0–0.5)
Eosinophils Relative: 0 %
HCT: 30.6 % — ABNORMAL LOW (ref 39.0–52.0)
Hemoglobin: 9.2 g/dL — ABNORMAL LOW (ref 13.0–17.0)
Immature Granulocytes: 1 %
Lymphocytes Relative: 9 %
Lymphs Abs: 1.1 10*3/uL (ref 0.7–4.0)
MCH: 26.8 pg (ref 26.0–34.0)
MCHC: 30.1 g/dL (ref 30.0–36.0)
MCV: 89.2 fL (ref 80.0–100.0)
Monocytes Absolute: 0.9 10*3/uL (ref 0.1–1.0)
Monocytes Relative: 7 %
Neutro Abs: 10.5 10*3/uL — ABNORMAL HIGH (ref 1.7–7.7)
Neutrophils Relative %: 83 %
Platelets: 412 10*3/uL — ABNORMAL HIGH (ref 150–400)
RBC: 3.43 MIL/uL — ABNORMAL LOW (ref 4.22–5.81)
RDW: 16.5 % — ABNORMAL HIGH (ref 11.5–15.5)
WBC: 12.6 10*3/uL — ABNORMAL HIGH (ref 4.0–10.5)
nRBC: 0 % (ref 0.0–0.2)

## 2022-05-26 LAB — BLOOD GAS, VENOUS
Acid-Base Excess: 5.3 mmol/L — ABNORMAL HIGH (ref 0.0–2.0)
Bicarbonate: 33.5 mmol/L — ABNORMAL HIGH (ref 20.0–28.0)
O2 Saturation: 83.5 %
Patient temperature: 37
pCO2, Ven: 65 mmHg — ABNORMAL HIGH (ref 44–60)
pH, Ven: 7.32 (ref 7.25–7.43)
pO2, Ven: 50 mmHg — ABNORMAL HIGH (ref 32–45)

## 2022-05-26 LAB — TROPONIN I (HIGH SENSITIVITY)
Troponin I (High Sensitivity): 12 ng/L (ref <18)
Troponin I (High Sensitivity): 11 ng/L (ref <18)

## 2022-05-26 LAB — LACTIC ACID, PLASMA
Lactic Acid, Venous: 1.3 mmol/L (ref 0.5–1.9)
Lactic Acid, Venous: 0.7 mmol/L (ref 0.5–1.9)

## 2022-05-26 LAB — MRSA NEXT GEN BY PCR, NASAL: MRSA by PCR Next Gen: NOT DETECTED

## 2022-05-26 LAB — COMPREHENSIVE METABOLIC PANEL
ALT: 26 U/L (ref 0–44)
AST: 29 U/L (ref 15–41)
Albumin: 2.6 g/dL — ABNORMAL LOW (ref 3.5–5.0)
Alkaline Phosphatase: 70 U/L (ref 38–126)
Anion gap: 9 (ref 5–15)
BUN: 17 mg/dL (ref 8–23)
CO2: 28 mmol/L (ref 22–32)
Calcium: 7.7 mg/dL — ABNORMAL LOW (ref 8.9–10.3)
Chloride: 101 mmol/L (ref 98–111)
Creatinine, Ser: 0.71 mg/dL (ref 0.61–1.24)
GFR, Estimated: >60 mL/min (ref >60)
Glucose, Bld: 127 mg/dL — ABNORMAL HIGH (ref 70–99)
Potassium: 4.3 mmol/L (ref 3.5–5.1)
Sodium: 138 mmol/L (ref 135–145)
Total Bilirubin: 0.5 mg/dL (ref 0.3–1.2)
Total Protein: 6.4 g/dL — ABNORMAL LOW (ref 6.5–8.1)

## 2022-05-26 LAB — COMP PANEL: LEUKEMIA/LYMPHOMA

## 2022-05-26 LAB — RESP PANEL BY RT-PCR (RSV, FLU A&B, COVID)  RVPGX2
Influenza A by PCR: NEGATIVE
Influenza B by PCR: NEGATIVE
Resp Syncytial Virus by PCR: NEGATIVE
SARS Coronavirus 2 by RT PCR: POSITIVE — AB

## 2022-05-26 LAB — BRAIN NATRIURETIC PEPTIDE: B Natriuretic Peptide: 192 pg/mL — ABNORMAL HIGH (ref 0.0–100.0)

## 2022-05-26 LAB — PROTIME-INR
INR: 1.1 (ref 0.8–1.2)
Prothrombin Time: 14.5 seconds (ref 11.4–15.2)

## 2022-05-26 LAB — APTT: aPTT: 33 seconds (ref 24–36)

## 2022-05-26 MED ORDER — PIPERACILLIN-TAZOBACTAM 3.375 G IVPB 30 MIN
3.3750 g | Freq: Once | INTRAVENOUS | Status: AC
  Administered 2022-05-26: 3.375 g via INTRAVENOUS
  Filled 2022-05-26: qty 50

## 2022-05-26 MED ORDER — VANCOMYCIN HCL 1250 MG/250ML IV SOLN
1250.0000 mg | Freq: Once | INTRAVENOUS | Status: AC
  Administered 2022-05-26: 1250 mg via INTRAVENOUS
  Filled 2022-05-26: qty 250

## 2022-05-26 MED ORDER — ENOXAPARIN SODIUM 40 MG/0.4ML IJ SOSY
40.0000 mg | PREFILLED_SYRINGE | INTRAMUSCULAR | Status: DC
  Administered 2022-05-26: 40 mg via SUBCUTANEOUS
  Filled 2022-05-26: qty 0.4

## 2022-05-26 MED ORDER — SODIUM CHLORIDE 0.9 % IV BOLUS
500.0000 mL | Freq: Once | INTRAVENOUS | Status: AC
  Administered 2022-05-26: 500 mL via INTRAVENOUS

## 2022-05-26 MED ORDER — SODIUM CHLORIDE 0.9 % IV SOLN
200.0000 mg | Freq: Once | INTRAVENOUS | Status: AC
  Administered 2022-05-26: 200 mg via INTRAVENOUS
  Filled 2022-05-26: qty 40

## 2022-05-26 MED ORDER — PIPERACILLIN-TAZOBACTAM 3.375 G IVPB
3.3750 g | Freq: Three times a day (TID) | INTRAVENOUS | Status: AC
  Administered 2022-05-26 – 2022-05-30 (×11): 3.375 g via INTRAVENOUS
  Filled 2022-05-26 (×12): qty 50

## 2022-05-26 MED ORDER — DEXAMETHASONE SODIUM PHOSPHATE 10 MG/ML IJ SOLN
6.0000 mg | Freq: Every day | INTRAMUSCULAR | Status: DC
  Administered 2022-05-26 – 2022-06-02 (×8): 6 mg via INTRAVENOUS
  Filled 2022-05-26: qty 1
  Filled 2022-05-26: qty 0.6
  Filled 2022-05-26 (×4): qty 1
  Filled 2022-05-26: qty 0.6
  Filled 2022-05-26: qty 1

## 2022-05-26 MED ORDER — IPRATROPIUM-ALBUTEROL 0.5-2.5 (3) MG/3ML IN SOLN
3.0000 mL | Freq: Four times a day (QID) | RESPIRATORY_TRACT | Status: DC
  Administered 2022-05-26 – 2022-05-27 (×3): 3 mL via RESPIRATORY_TRACT
  Filled 2022-05-26 (×3): qty 3

## 2022-05-26 MED ORDER — ROSUVASTATIN CALCIUM 20 MG PO TABS
20.0000 mg | ORAL_TABLET | Freq: Every day | ORAL | Status: DC
  Administered 2022-05-26 – 2022-06-03 (×9): 20 mg via ORAL
  Filled 2022-05-26 (×4): qty 2
  Filled 2022-05-26 (×3): qty 1
  Filled 2022-05-26 (×3): qty 2

## 2022-05-26 MED ORDER — ASPIRIN 81 MG PO TBEC
81.0000 mg | DELAYED_RELEASE_TABLET | Freq: Every day | ORAL | Status: DC
  Administered 2022-05-27 – 2022-06-03 (×8): 81 mg via ORAL
  Filled 2022-05-26 (×8): qty 1

## 2022-05-26 MED ORDER — METHYLPREDNISOLONE SODIUM SUCC 125 MG IJ SOLR
125.0000 mg | Freq: Once | INTRAMUSCULAR | Status: AC
  Administered 2022-05-26: 125 mg via INTRAVENOUS
  Filled 2022-05-26: qty 2

## 2022-05-26 MED ORDER — IPRATROPIUM-ALBUTEROL 0.5-2.5 (3) MG/3ML IN SOLN
3.0000 mL | Freq: Once | RESPIRATORY_TRACT | Status: AC
  Administered 2022-05-26: 3 mL via RESPIRATORY_TRACT
  Filled 2022-05-26: qty 3

## 2022-05-26 MED ORDER — SODIUM CHLORIDE 0.9 % IV SOLN
500.0000 mg | Freq: Once | INTRAVENOUS | Status: AC
  Administered 2022-05-26: 500 mg via INTRAVENOUS
  Filled 2022-05-26: qty 5

## 2022-05-26 MED ORDER — SODIUM CHLORIDE 0.9 % IV SOLN
100.0000 mg | Freq: Every day | INTRAVENOUS | Status: DC
  Administered 2022-05-27: 100 mg via INTRAVENOUS
  Filled 2022-05-26 (×2): qty 20

## 2022-05-26 MED ORDER — CHLORHEXIDINE GLUCONATE CLOTH 2 % EX PADS
6.0000 | MEDICATED_PAD | Freq: Every day | CUTANEOUS | Status: DC
  Administered 2022-05-26 – 2022-06-03 (×5): 6 via TOPICAL

## 2022-05-26 MED ORDER — DEXAMETHASONE 6 MG PO TABS
6.0000 mg | ORAL_TABLET | Freq: Once | ORAL | Status: DC
  Filled 2022-05-26 (×2): qty 1

## 2022-05-26 MED ORDER — CLOPIDOGREL BISULFATE 75 MG PO TABS
75.0000 mg | ORAL_TABLET | Freq: Every day | ORAL | Status: DC
  Administered 2022-05-27 – 2022-06-03 (×8): 75 mg via ORAL
  Filled 2022-05-26 (×8): qty 1

## 2022-05-26 MED ORDER — LATANOPROST 0.005 % OP SOLN
1.0000 [drp] | Freq: Every day | OPHTHALMIC | Status: DC
  Administered 2022-05-26 – 2022-06-02 (×7): 1 [drp] via OPHTHALMIC
  Filled 2022-05-26 (×2): qty 2.5

## 2022-05-26 NOTE — Consult Note (Signed)
PHARMACY -  BRIEF ANTIBIOTIC NOTE   Pharmacy has received consult(s) for vancomycin from an ED provider.  The patient's profile has been reviewed for ht/wt/allergies/indication/available labs.    One time order(s) placed for Vancomycin 1250 mg IV  Further antibiotics/pharmacy consults should be ordered by admitting physician if indicated.                       Thank you, Alison Murray 05/26/2022  2:04 PM

## 2022-05-26 NOTE — ED Notes (Signed)
RT to room to set up bipap

## 2022-05-26 NOTE — ED Provider Notes (Addendum)
[facility] Provider Note    Event Date/Time   First MD Initiated Contact with Patient 05/26/22 1310     (approximate)   History   No chief complaint on file.   HPI  Lucas Ramirez is a 67 y.o. male   Past medical history of COPD not on home oxygen, CHF, stroke, Bell's palsy, chronic back pain, hypertension and hyperlipidemia, tobacco use who presents to the emergency department with cough, shortness of breath, fever Tmax 101 at home.  Patient states symptoms over the past several days.  Productive cough.  Does not use albuterol at home, but did receive a nebulizer treatment by medic which the patient says improved his shortness of breath.  Exertional dyspnea, orthopnea as well, sleeps with several pillows sitting up.  Denies GU or GI symptoms.  No chest pain.  No other acute medical complaints  External Medical Documents Reviewed: Discharge summary from April 21, 2022 for shortness of breath likely multifactorial COPD/CHF, C. difficile diarrhea, and stroke      Physical Exam   Triage Vital Signs: ED Triage Vitals  Enc Vitals Group     BP 05/26/22 1315 135/87     Pulse Rate 05/26/22 1315 (!) 117     Resp 05/26/22 1315 (!) 24     Temp --      Temp src --      SpO2 05/26/22 1315 92 %     Weight 05/26/22 1316 120 lb (54.4 kg)     Height 05/26/22 1316 '5\' 4"'$  (1.626 m)     Head Circumference --      Peak Flow --      Pain Score 05/26/22 1316 8     Pain Loc --      Pain Edu? --      Excl. in [place]? --     Most recent vital signs: Vitals:   05/26/22 1315 05/26/22 1435  BP: 135/87   Pulse: (!) 117 (!) 116  Resp: (!) 24 (!) 26  Temp: 98.9 F (37.2 C)   SpO2: 92% 100%    General: Awake, no distress.  CV:  Good peripheral perfusion.  Resp:  Normal effort.  Abd:  No distention.  Other:  Awake alert oriented in no acute distress. Tachy 110s and RR 24. Distant lung sounds w fine crackles in bases. Abd soft non tender. R>L lower ext pitting  edema. No fever.    ED Results / Procedures / Treatments   Labs (all labs ordered are listed, but only abnormal results are displayed) Labs Reviewed  RESP PANEL BY RT-PCR (RSV, FLU A&B, COVID)  RVPGX2 - Abnormal; Notable for the following components:      Result Value   SARS Coronavirus 2 by RT PCR POSITIVE (*)    All other components within normal limits  COMPREHENSIVE METABOLIC PANEL - Abnormal; Notable for the following components:   Glucose, Bld 127 (*)    Calcium 7.7 (*)    Total Protein 6.4 (*)    Albumin 2.6 (*)    All other components within normal limits  CBC WITH DIFFERENTIAL/PLATELET - Abnormal; Notable for the following components:   WBC 12.6 (*)    RBC 3.43 (*)    Hemoglobin 9.2 (*)    HCT 30.6 (*)    RDW 16.5 (*)    Platelets 412 (*)    Neutro Abs 10.5 (*)    Abs Immature Granulocytes 0.09 (*)    All other components within normal limits  BRAIN NATRIURETIC PEPTIDE - Abnormal; Notable for the following components:   B Natriuretic Peptide 192.0 (*)    All other components within normal limits  BLOOD GAS, VENOUS - Abnormal; Notable for the following components:   pCO2, Ven 65 (*)    pO2, Ven 50 (*)    Bicarbonate 33.5 (*)    Acid-Base Excess 5.3 (*)    All other components within normal limits  CULTURE, BLOOD (ROUTINE X 2)  CULTURE, BLOOD (ROUTINE X 2)  LACTIC ACID, PLASMA  PROTIME-INR  APTT  LACTIC ACID, PLASMA  URINALYSIS, W/ REFLEX TO CULTURE (INFECTION SUSPECTED)  TROPONIN I (HIGH SENSITIVITY)  TROPONIN I (HIGH SENSITIVITY)     I ordered and reviewed the above labs they are notable for pH of 7.32 with a pCO2 of 65, normal creatinine, white blood cell count elevated at 12.6 baseline hemoglobin 9.2,  EKG  ED ECG REPORT I, Lucillie Garfinkel, the attending physician, personally viewed and interpreted this ECG.   Date: 05/26/2022  EKG Time: 1319  Rate: 110  Rhythm: sinus tachycardia  Axis: LAD  Intervals:none  ST&T Change: no acute ischemic  changes    RADIOLOGY I independently reviewed and interpreted chest x-ray see no obvious focal consolidations or pneumothorax.   PROCEDURES:  Critical Care performed: Yes, see critical care procedure note(s)  .Critical Care  Performed by: Lucillie Garfinkel, MD Authorized by: Lucillie Garfinkel, MD   Critical care provider statement:    Critical care time (minutes):  30   Critical care was necessary to treat or prevent imminent or life-threatening deterioration of the following conditions:  Sepsis   Critical care was time spent personally by me on the following activities:  Development of treatment plan with patient or surrogate, discussions with consultants, evaluation of patient's response to treatment, examination of patient, ordering and review of laboratory studies, ordering and review of radiographic studies, ordering and performing treatments and interventions, pulse oximetry, re-evaluation of patient's condition and review of old [employer] ED: Medications  azithromycin (ZITHROMAX) 500 mg in sodium chloride 0.9 % 250 mL IVPB (500 mg Intravenous New Bag/Given 05/26/22 1428)  vancomycin (VANCOREADY) IVPB 1250 mg/250 mL (has no administration in time range)  sodium chloride 0.9 % bolus 500 mL (has no administration in time range)  dexamethasone (DECADRON) tablet 6 mg (has no administration in time range)  ipratropium-albuterol (DUONEB) 0.5-2.5 (3) MG/3ML nebulizer solution 3 mL (3 mLs Nebulization Given 05/26/22 1403)  methylPREDNISolone sodium succinate (SOLU-MEDROL) 125 mg/2 mL injection 125 mg (125 mg Intravenous Given 05/26/22 1403)  piperacillin-tazobactam (ZOSYN) IVPB 3.375 g (0 g Intravenous Stopped 05/26/22 1431)    External physician / consultants:  I spoke with hospitalist for admission and regarding care plan for this patient.   IMPRESSION / MDM / ASSESSMENT AND PLAN / ED COURSE  I reviewed the triage vital signs and the nursing notes.                                 Patient's presentation is most consistent with acute presentation with potential threat to life or bodily function.  Differential diagnosis includes, but is not limited to, sepsis, resp infx, copd exacerbation, chf exacerbation, metabolic derangement, viral uri    The patient is on the cardiac monitor to evaluate for evidence of arrhythmia and/or significant heart rate changes.  MDM:  Sepsis eval w reported fever at home, tachy, tachypneic w resp infx sx.  No wheezing but hx COPD w improvement from medic's duoneb will give duoneb and steroids. Rales and LE edema suspect an element of CHF too - await CXR and BNP and hold excessive fluids given desats. Broad spectrum abx.  Patient with moderately increased work of breathing, mentation is normal so placed on BiPAP for respiratory support.  VBG with elevated pCO2 at 65 and a pH of 7.32, pCO2 in December was 43, placed on BiPAP for ventilatory support.  Chest x-ray shows no obvious signs of pulmonary edema, and his proBNP is not markedly elevated, I think his symptoms are more likely due to respiratory infection/COPD exacerbation rather than pulmonary edema but given some rales, lower extremity edema, B-lines on ultrasound I will start with gentle fluid hydration 500 cc to reassess fluid status.  +COVID; Remdesivir ordered as well as Decadron.  Admit to hospitalist service.       FINAL CLINICAL IMPRESSION(S) / ED DIAGNOSES   Final diagnoses:  COPD exacerbation ([place])  Acute cough  Hypercapnia  COVID     Rx / DC Orders   ED Discharge Orders     None        Note:  This document was prepared using Dragon voice recognition software and may include unintentional dictation errors.    Lucillie Garfinkel, MD 05/26/22 1454    Lucillie Garfinkel, MD 05/26/22 [phone]

## 2022-05-26 NOTE — Consult Note (Signed)
Remdesivir - Pharmacy Brief Note   O:  ALT: 26 CXR: Small right-greater-than-left pleural effusions and bibasilar atelectasis SpO2: 99-100% on Bi-PAP   A/P:  Remdesivir 200 mg IVPB once followed by 100 mg IVPB daily x 2 days. (total 3 days)  Undrea Archbold Rodriguez-Guzman PharmD, BCPS 05/26/2022 3:41 PM

## 2022-05-26 NOTE — H&P (Signed)
History and Physical    Patient: Lucas Ramirez QBH:[id number] DOB: Dec 07, 1955 DOA: 05/26/2022 DOS: the patient was seen and examined on 05/26/2022 PCP: Birdie Sons, MD  Patient coming from: Home  Chief Complaint: No chief complaint on file.  HPI: Lucas Ramirez is a 67 y.o. male  who presents from home for SOB and hallucinations. Pt is on BIPAP and not talking at this time. Thus, the pt's wife was called to provide the history (Lucas Ramirez). Wife reports that since the pt was discharged from the hospital in Dec 2023 he has had home health visiting the house for physical therapy needs.  However, the PT personnel was apparently COVID + herself. The pt's wife reports that she attempted to bring the pt to the hospital 2 weeks ago but the pt himself refused an ambulance and refused a visit to the PCP).  Pt has had cough, fever and chills at home.  However, today the pt's vitals were irregular (HR 106 and BP 160/90) with a fever at home. Thus, today when the wife called the ambulance the pt did not refuse the ambulance. 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 He was confirmed COVID + in the ER today 05/26/2022.   Spoke with the wife on the phone today regarding the goals of care given the pt's current presentation, BIPAP requirements, lack of mentation/situational awareness and co-morbidities. The wife clearly advised that Lucas Ramirez is a DNR/DNI and she would like him to remain a DNR/DNI for this admission. The wife was clearly advised of the high possibility of decompensation and death for Lucas Ramirez. She understood that death was a possibility. Pt will be admitted to the medicine service for further management.   Review of Systems: As mentioned in the history of present illness. All other systems reviewed and are negative. Past Medical History:  Diagnosis Date   Bell's palsy    CAP (community acquired pneumonia) 06/28/2015   CVA (cerebral  vascular accident) ([place])    Depression    GERD (gastroesophageal reflux disease)    Glaucoma    Hypertension    Migraine    Scoliosis    Shingles 2019   dx by dermatologist by patient report   Past Surgical History:  Procedure Laterality Date   [place]   correcting Scoliosils per pt   COLONOSCOPY WITH PROPOFOL N/A 07/04/2019   Procedure: COLONOSCOPY WITH PROPOFOL;  Surgeon: Lin Landsman, MD;  Location: [facility];  Service: Gastroenterology;  Laterality: N/A;  PRIORITY 4   LUMBAR LAMINECTOMY/ DECOMPRESSION WITH MET-RX Right 10/30/2019   Procedure: L4-5 DECOMPRESSION;  Surgeon: Meade Maw, MD;  Location: [facility];  Service: Neurosurgery;  Laterality: Right;   SKIN LESION EXCISION  06/06/2013   facial left cheek. Dr. Evorn Gong   UPPER GI ENDOSCOPY  07/11/2008   [place]. Dr. Donnella Sham. Impression. -LA Grade C reflux esophagitis. 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   Social History:  reports that he has been smoking cigarettes. He has a 70.00 pack-year smoking history. He has never used smokeless tobacco. He reports that he does not drink alcohol and does not use drugs.  Allergies  Allergen Reactions   Bupropion Hcl Other (See Comments)    seizures    Family History  Problem Relation Age of Onset   Diabetes Mother    Emphysema Mother    Down syndrome Sister    CAD Neg  Hx    Heart disease Neg Hx    Colon cancer Neg Hx    Prostate cancer Neg Hx     Prior to Admission medications   Medication Sig Start Date End Date Taking? Authorizing Provider  acetaminophen (TYLENOL) 500 MG tablet Take 1 tablet (500 mg total) by mouth every 6 (six) hours as needed for mild pain. 10/30/19  Yes Zdeb, Altha Harm, NP  albuterol (VENTOLIN HFA) 108 (90 Base) MCG/ACT inhaler Inhale 2 puffs into the lungs every 6 (six) hours as needed for wheezing or shortness of breath. 08/17/21  Yes Sreenath, Sudheer B, MD  aspirin EC 81 MG tablet Take 1 tablet (81 mg  total) by mouth daily. Swallow whole. 04/22/22  Yes Jennye Boroughs, MD  calcium carbonate (OSCAL) 1500 (600 Ca) MG TABS tablet Take 600 mg of elemental calcium by mouth 2 (two) times daily with a meal.   Yes [provider]  cholecalciferol (VITAMIN D) 1000 UNITS tablet Take 1,000 Units by mouth daily.   Yes [provider]  clopidogrel (PLAVIX) 75 MG tablet TAKE 1 TABLET BY MOUTH ONCE DAILY Patient taking differently: Take 75 mg by mouth at bedtime. 08/04/21  Yes Kris Hartmann, NP  cyproheptadine (PERIACTIN) 4 MG tablet Take 1 tablet (4 mg total) by mouth 3 (three) times daily as needed (appetite). 05/06/22  Yes Birdie Sons, MD  denosumab (PROLIA) 60 MG/ML SOSY injection Inject 60 mg into the skin every 6 (six) months.   Yes [provider]  ferrous sulfate 325 (65 FE) MG tablet Take 1 tablet (325 mg total) by mouth every other day. 04/22/22  Yes Jennye Boroughs, MD  furosemide (LASIX) 20 MG tablet Take 1 tablet (20 mg total) by mouth daily. 04/21/22  Yes Jennye Boroughs, MD  gabapentin (NEURONTIN) 800 MG tablet TAKE 1 TABLET BY MOUTH 3 TIMES DAILY AS NEEDED AS DIRECTED 12/14/21  Yes Birdie Sons, MD  latanoprost (XALATAN) 0.005 % ophthalmic solution Place 1-2 drops into both eyes See admin instructions. Instill 1 drop into left eye at bedtime Instill 2 drops into right eye at bedtime   Yes [provider]  lisinopril (ZESTRIL) 20 MG tablet TAKE 1 TABLET BY MOUTH ONCE DAILY 02/22/22  Yes Birdie Sons, MD  megestrol (MEGACE) 400 MG/10ML suspension Take 20 mLs (800 mg total) by mouth daily. 03/08/22 05/26/22 Yes Birdie Sons, MD  mirtazapine (REMERON) 15 MG tablet TAKE 1/2-1 TABLET BY MOUTH AT BEDTIME 10/19/21  Yes Eulas Post, MD  omeprazole (PRILOSEC) 20 MG capsule TAKE 1 CAPSULE BY MOUTH ONCE DAILY 08/04/21  Yes Birdie Sons, MD  oxyCODONE (OXY IR/ROXICODONE) 5 MG immediate release tablet TAKE 1 TABLET BY MOUTH EVERY 4 HOURS 05/24/22  Yes  Birdie Sons, MD  rosuvastatin (CRESTOR) 20 MG tablet TAKE 1 TABLET BY MOUTH AT BEDTIME 08/04/21  Yes Birdie Sons, MD    Physical Exam: Vitals:   05/26/22 1316 05/26/22 1320 05/26/22 1435 05/26/22 1500  BP:    123/68  Pulse:   (!) 116 (!) 119  Resp:   (!) 26 (!) 21  Temp:      TempSrc:      SpO2: (!) 87% 95% 100% 99%  Weight: 54.4 kg     Height: '5\' 4"'$  (1.626 m)      Physical Exam Constitutional:      Appearance: He is ill-appearing.     Comments: Obtunded on BIPAP   HENT:     Head: Atraumatic.  Cardiovascular:     Rate and Rhythm: Tachycardia present.  Pulmonary:     Breath sounds: Rhonchi and rales present.  Abdominal:     General: Abdomen is flat.     Palpations: Abdomen is soft.  Musculoskeletal:        General: Normal range of motion.     Cervical back: Neck supple.  Skin:    General: Skin is warm.     Comments: Scattered bruises on either arms   Neurological:     Comments: Obtunded   Psychiatric:     Comments: Unable to evaluate      Data Reviewed:  There are no new results to review at this time.  Assessment and Plan: Acute hypoxic respiratory failure due to COVID 19 virus  - BIPAP  - IV remdesivir with pharmacy consult  - IV dexamethasone 6 mg daily  - IV zosyn 3.375 g q8hr  - Duoneb q6 hr   Pneumonia  - Management as above  - F/u blood cx's    3. Acute metabolic encephalopathy  - CT head now  - Likely due to the pt's COVID infection   4. Hx of CVA  - ASA 81 mg PO daily  - Plavix 75 mg PO daily   DVT prophylaxis: Lovenox 40 mg sq daily    Advance Care Planning:   Code Status: DNR   Family Communication: Spoke with the pt's wife on the phone regarding the pt's current clinical status and goals of care (she confirmed DNR/DNI)  Severity of Illness: The appropriate patient status for this patient is INPATIENT. Inpatient status is judged to be reasonable and necessary in order to provide the required intensity of service to ensure the  patient's safety. The patient's presenting symptoms, physical exam findings, and initial radiographic and laboratory data in the context of their chronic comorbidities is felt to place them at high risk for further clinical deterioration. Furthermore, it is not anticipated that the patient will be medically stable for discharge from the hospital within 2 midnights of admission.   * I certify that at the point of admission it is my clinical judgment that the patient will require inpatient hospital care spanning beyond 2 midnights from the point of admission due to high intensity of service, high risk for further deterioration and high frequency of surveillance required.*  Author: Lucienne Minks , MD 05/26/2022 3:52 PM  For on call review [email or web address].

## 2022-05-26 NOTE — ED Triage Notes (Signed)
Pt coming from home for sob, fever, and ams. Wife reports tachy for 2 weeks and fever starting today. Wife reports fever of 101 at home. Temp of 99.3 and tachy with ems. 88-89% on room air with ems.  Pt no longer sob after a breathing treatment.   Hx copd with no home o2 use

## 2022-05-27 ENCOUNTER — Telehealth: Payer: Self-pay | Admitting: Family Medicine

## 2022-05-27 DIAGNOSIS — U071 COVID-19: Secondary | ICD-10-CM | POA: Diagnosis not present

## 2022-05-27 LAB — COMPREHENSIVE METABOLIC PANEL
ALT: 24 U/L (ref 0–44)
AST: 26 U/L (ref 15–41)
Albumin: 2.3 g/dL — ABNORMAL LOW (ref 3.5–5.0)
Alkaline Phosphatase: 67 U/L (ref 38–126)
Anion gap: 3 — ABNORMAL LOW (ref 5–15)
BUN: 17 mg/dL (ref 8–23)
CO2: 30 mmol/L (ref 22–32)
Calcium: 6.9 mg/dL — ABNORMAL LOW (ref 8.9–10.3)
Chloride: 105 mmol/L (ref 98–111)
Creatinine, Ser: 0.75 mg/dL (ref 0.61–1.24)
GFR, Estimated: >60 mL/min (ref >60)
Glucose, Bld: 158 mg/dL — ABNORMAL HIGH (ref 70–99)
Potassium: 4.4 mmol/L (ref 3.5–5.1)
Sodium: 138 mmol/L (ref 135–145)
Total Bilirubin: 0.5 mg/dL (ref 0.3–1.2)
Total Protein: 5.7 g/dL — ABNORMAL LOW (ref 6.5–8.1)

## 2022-05-27 LAB — CBC
HCT: 25.3 % — ABNORMAL LOW (ref 39.0–52.0)
Hemoglobin: 7.9 g/dL — ABNORMAL LOW (ref 13.0–17.0)
MCH: 27.3 pg (ref 26.0–34.0)
MCHC: 31.2 g/dL (ref 30.0–36.0)
MCV: 87.5 fL (ref 80.0–100.0)
Platelets: 370 10*3/uL (ref 150–400)
RBC: 2.89 MIL/uL — ABNORMAL LOW (ref 4.22–5.81)
RDW: 16.4 % — ABNORMAL HIGH (ref 11.5–15.5)
WBC: 12.8 10*3/uL — ABNORMAL HIGH (ref 4.0–10.5)
nRBC: 0 % (ref 0.0–0.2)

## 2022-05-27 LAB — PHOSPHORUS: Phosphorus: 3.1 mg/dL (ref 2.5–4.6)

## 2022-05-27 LAB — MAGNESIUM: Magnesium: 1.9 mg/dL (ref 1.7–2.4)

## 2022-05-27 LAB — C-REACTIVE PROTEIN: CRP: 9.8 mg/dL — ABNORMAL HIGH (ref <1.0)

## 2022-05-27 MED ORDER — MIRTAZAPINE 15 MG PO TABS
15.0000 mg | ORAL_TABLET | Freq: Every day | ORAL | Status: DC
  Administered 2022-05-27 – 2022-06-02 (×7): 15 mg via ORAL
  Filled 2022-05-27 (×7): qty 1

## 2022-05-27 MED ORDER — HYDRALAZINE HCL 10 MG PO TABS: 10.0000 mg | ORAL_TABLET | Freq: Four times a day (QID) | ORAL | Status: DC | PRN

## 2022-05-27 MED ORDER — VITAMIN C 500 MG PO TABS
500.0000 mg | ORAL_TABLET | Freq: Two times a day (BID) | ORAL | Status: DC
  Administered 2022-05-27 – 2022-06-03 (×14): 500 mg via ORAL
  Filled 2022-05-27 (×14): qty 1

## 2022-05-27 MED ORDER — IPRATROPIUM-ALBUTEROL 20-100 MCG/ACT IN AERS
2.0000 | INHALATION_SPRAY | Freq: Four times a day (QID) | RESPIRATORY_TRACT | Status: DC
  Administered 2022-05-27 – 2022-06-03 (×26): 2 via RESPIRATORY_TRACT
  Filled 2022-05-27: qty 4

## 2022-05-27 MED ORDER — GABAPENTIN 400 MG PO CAPS
800.0000 mg | ORAL_CAPSULE | Freq: Three times a day (TID) | ORAL | Status: DC
  Administered 2022-05-27 – 2022-05-30 (×9): 800 mg via ORAL
  Filled 2022-05-27 (×9): qty 2

## 2022-05-27 MED ORDER — CALCIUM GLUCONATE-NACL 2-0.675 GM/100ML-% IV SOLN
2.0000 g | Freq: Once | INTRAVENOUS | Status: AC
  Administered 2022-05-27: 2000 mg via INTRAVENOUS
  Filled 2022-05-27: qty 100

## 2022-05-27 MED ORDER — ZINC SULFATE 220 (50 ZN) MG PO CAPS
220.0000 mg | ORAL_CAPSULE | Freq: Every day | ORAL | Status: DC
  Administered 2022-05-27 – 2022-06-03 (×8): 220 mg via ORAL
  Filled 2022-05-27 (×8): qty 1

## 2022-05-27 MED ORDER — ENSURE ENLIVE PO LIQD
237.0000 mL | Freq: Three times a day (TID) | ORAL | Status: DC
  Administered 2022-05-27 – 2022-06-03 (×20): 237 mL via ORAL

## 2022-05-27 MED ORDER — ENOXAPARIN SODIUM 30 MG/0.3ML IJ SOSY
30.0000 mg | PREFILLED_SYRINGE | INTRAMUSCULAR | Status: DC
  Administered 2022-05-27 – 2022-06-02 (×7): 30 mg via SUBCUTANEOUS
  Filled 2022-05-27 (×7): qty 0.3

## 2022-05-27 MED ORDER — LISINOPRIL 20 MG PO TABS
20.0000 mg | ORAL_TABLET | Freq: Every day | ORAL | Status: DC
  Administered 2022-05-27 – 2022-06-03 (×8): 20 mg via ORAL
  Filled 2022-05-27 (×2): qty 1
  Filled 2022-05-27: qty 4
  Filled 2022-05-27 (×2): qty 1
  Filled 2022-05-27: qty 4
  Filled 2022-05-27 (×2): qty 1

## 2022-05-27 MED ORDER — HYDRALAZINE HCL 20 MG/ML IJ SOLN
5.0000 mg | INTRAMUSCULAR | Status: DC | PRN
  Administered 2022-05-28: 5 mg via INTRAVENOUS
  Filled 2022-05-27 (×2): qty 1

## 2022-05-27 MED ORDER — OXYCODONE HCL 5 MG PO TABS
5.0000 mg | ORAL_TABLET | ORAL | Status: DC | PRN
  Administered 2022-05-27 – 2022-06-01 (×15): 5 mg via ORAL
  Filled 2022-05-27 (×15): qty 1

## 2022-05-27 MED ORDER — ACETAMINOPHEN 325 MG PO TABS
650.0000 mg | ORAL_TABLET | Freq: Four times a day (QID) | ORAL | Status: DC | PRN
  Administered 2022-05-27 (×2): 650 mg via ORAL
  Filled 2022-05-27 (×2): qty 2

## 2022-05-27 MED ORDER — ADULT MULTIVITAMIN W/MINERALS CH
1.0000 | ORAL_TABLET | Freq: Every day | ORAL | Status: DC
  Administered 2022-05-27 – 2022-06-03 (×8): 1 via ORAL
  Filled 2022-05-27 (×8): qty 1

## 2022-05-27 NOTE — Progress Notes (Signed)
Initial Nutrition Assessment  DOCUMENTATION CODES:   Underweight, Severe malnutrition in context of chronic illness  INTERVENTION:   -MVI with minerals daily -500 mg vitamin C BID -220 mg zinc sulfate daily -Ensure Enlive po TID, each supplement provides 350 kcal and 20 grams of protein.  -Magic cup TID with meals, each supplement provides 290 kcal and 9 grams of protein   NUTRITION DIAGNOSIS:   Severe Malnutrition related to chronic illness (CVA) as evidenced by percent weight loss, severe fat depletion, severe muscle depletion.  GOAL:   Patient will meet greater than or equal to 90% of their needs  MONITOR:   PO intake, Supplement acceptance  REASON FOR ASSESSMENT:   Malnutrition Screening Tool    ASSESSMENT:   Pt admitted for shortness of breath and hallucinations.  Pt admitted with acute respiratory failure secondary to COVID-19.   Reviewed I/O's: -8.1 ml x 24 hours  UOP: 650 ml x 24 hours  Pt lying in bed at time of visit. No family at bedside to provide additional history. Pt reports he is feeling "half" today ("not great, but not bad"). Pt endorses a poor appetite over the past several months. Per pt, he is unsure how often he eats, but states that it is "not much". Pt reports that he received food from "[place]". He is unable to provide a diet recall or food frequency, but shares that his favorite food is steak.  Pt denies any difficulty chewing or swallowing foods. He also denies any taste changes or loss of smell.   Pt unsure of UBW but reveals he has experienced steady weight loss over the past 9-12 months. Reviewed wt hx; pt has experienced a 26.8% wt loss over the past month, which is significant for time frame.   Discussed importance of good meal and supplement intake to promote healing. Pt amenable to supplements, stating he consuming 2-3 Ensure drinks daily PTA.   Medications reviewed and include decadron and remdesivir.   Labs  reviewed: CBGS: 149.    NUTRITION - FOCUSED PHYSICAL EXAM:  Flowsheet Row Most Recent Value  Orbital Region Severe depletion  Upper Arm Region Severe depletion  Thoracic and Lumbar Region Severe depletion  Buccal Region Severe depletion  Temple Region Severe depletion  Clavicle Bone Region Severe depletion  Clavicle and Acromion Bone Region Severe depletion  Scapular Bone Region Severe depletion  Dorsal Hand Severe depletion  Patellar Region Severe depletion  Anterior Thigh Region Severe depletion  Posterior Calf Region Severe depletion  Edema (RD Assessment) None  Hair Reviewed  Eyes Reviewed  Mouth Reviewed  Skin Reviewed  Nails Reviewed       Diet Order:   Diet Order             Diet regular Room service appropriate? Yes; Fluid consistency: Thin  Diet effective now                   EDUCATION NEEDS:   Education needs have been addressed  Skin:  Skin Assessment: Skin Integrity Issues: Skin Integrity Issues:: Stage II Stage II: coccyx  Last BM:  Unknown  Height:   Ht Readings from Last 1 Encounters:  05/26/22 '5\' 4"'$  (1.626 m)    Weight:   Wt Readings from Last 1 Encounters:  05/26/22 36.3 kg    Ideal Body Weight:  59.1 kg  BMI:  Body mass index is 13.74 kg/m.  Estimated Nutritional Needs:   Kcal:  1450-1650  Protein:  75-90 grams  Fluid:  > 1.4 L    Loistine Chance, RD, LDN, [place] Registered Dietitian II Certified Diabetes Care and Education Specialist Please refer to [facility] for RD and/or RD on-call/weekend/after hours pager

## 2022-05-27 NOTE — Progress Notes (Signed)
PHARMACIST - PHYSICIAN COMMUNICATION  CONCERNING:  Enoxaparin (Lovenox) for DVT Prophylaxis    RECOMMENDATION: Patient was prescribed enoxaprin '40mg'$  q24 hours for VTE prophylaxis.   Filed Weights   05/26/22 1316 05/26/22 1715  Weight: 54.4 kg (120 lb) 36.3 kg (80 lb 0.4 oz)    Body mass index is 13.74 kg/m.  Estimated Creatinine Clearance: 46.6 mL/min (by C-G formula based on SCr of 0.75 mg/dL).   Patient is candidate for enoxaparin '30mg'$  every 24 hours based on CrCl <19m/min or Weight <45kg  DESCRIPTION: Pharm[facility]  Patient is now receiving enoxaparin 30 mg every 24 hours    CGlean Salvo PharmD, BCPS Clinical Pharmacist  05/27/2022 4:44 PM

## 2022-05-27 NOTE — TOC Initial Note (Signed)
Transition of Care [facility]) - Initial/Assessment Note    Patient Details  Name: Lucas Ramirez MRN: [id number] Date of Birth: Apr 21, 1956  Transition of Care [facility]) CM/SW Contact:    Shelbie Hutching, RN Phone Number: 05/27/2022, 5:02 PM  Clinical Narrative:                 Patient admitted to the hospital with COVID 19 currently on acute O2 at 2L.  Met with patient at the bedside introduced self and explained role in DC planning.  Patient is from home with his wife, he says it is okay to call and speak with is wife Jenny Reichmann.   RNCM spoke with [facility] via phone, she reports that the patient sleeps in a recliner, he has not been walking.  Her son will pick him up and get him to the shower.  She thinks that they will definitely benefit from a lift.  Patient is a smoker and she reports that he has burned himself because sometimes he drops the cigarettes.   She says they do not approved of his continued smoking but that he keeps asking them for them.   Patient is current with [facility] for RN and PT, wife also has [facility] for [facility].   Friend was at the bedside when [facility] talked with patient, she reports no concerns and goes over to the home frequently to check in on them.  Jenny Reichmann is very interested in Hospice at discharge and would like a hospital bed and lift.  Patient may also need oxygen at discharge.  Reached out to [facility] with [facility] as they also have hospice services.    TOC will cont to follow and assist with discharge planning  Expected Discharge Plan: Home w Hospice Care Barriers to Discharge: Continued Medical Work up   Patient Goals and CMS Choice Patient states their goals for this hospitalization and ongoing recovery are:: Patient states he wants to get better and go home CMS Medicare.gov Compare Post Acute Care list provided to:: Patient Choice offered to / list presented to : Patient, Spouse      Expected Discharge Plan and Services   Discharge Planning Services: CM  Consult Post Acute Care Choice: Hospice Living arrangements for the past 2 months: Single Family Home                           HH Arranged: RN, PT HH Agency: [place] Date [place]: 05/27/22 Time [place]: 1701 Representative spoke with at [place]: [place] Arrangements/Services Living arrangements for the past 2 months: [place] with:: Adult Children, Spouse Patient language and need for interpreter reviewed:: Yes Do you feel safe going back to the place where you live?: Yes      Need for Family Participation in Patient Care: Yes (Comment) Care giver support system in place?: Yes (comment) Current home services: DME, Homehealth aide, Hospice Criminal Activity/Legal Involvement Pertinent to Current Situation/Hospitalization: No - Comment as needed  Activities of Daily Living   ADL Screening (condition at time of admission) Patient's cognitive ability adequate to safely complete daily activities?: No Is the patient deaf or have difficulty hearing?: No Does the patient have difficulty seeing, even when wearing glasses/contacts?: Yes Does the patient have difficulty concentrating, remembering, or making decisions?: Yes Patient able to express need for assistance with ADLs?: No Does the patient have difficulty dressing or bathing?: Yes Independently performs  ADLs?: No  Permission Sought/Granted Permission sought to share information with : Family Supports, Chartered certified accountant granted to share information with : Yes, Verbal Permission Granted  Share Information with NAME: Gor Vestal     Permission granted to share info w Relationship: spouse  Permission granted to share info w Contact Information: [phone]  Emotional Assessment Appearance:: Appears older than stated age Attitude/Demeanor/Rapport: Engaged Affect (typically observed): Accepting Orientation: : Oriented to Self, Oriented  to Place, Oriented to  Time, Oriented to Situation Alcohol / Substance Use: Tobacco Use Psych Involvement: No (comment)  Admission diagnosis:  Hypercapnia [R06.89] COPD exacerbation (HCC) [J44.1] COVID [U07.1] Acute cough [R05.1] COVID-19 [U07.1] Patient Active Problem List   Diagnosis Date Noted   COVID-19 05/26/2022   Anemia 05/24/2022   Leucocytosis 05/24/2022   Interstitial lung disease ([place]) 04/21/2022   C. difficile diarrhea 04/16/2022   Protein-calorie malnutrition, severe 04/14/2022   Chronic diastolic CHF (congestive heart failure) ([place]) 04/14/2022   Pressure injury of skin 04/10/2022   Shortness of breath 04/09/2022   Cachexia ([place]) 04/09/2022   Sepsis due to pneumonia ([place]) 08/14/2021   Hyponatremia 08/14/2021   Hypertensive urgency 08/14/2021   History of CVA (cerebrovascular accident) 08/14/2021   Dyslipidemia 08/14/2021   Tobacco dependence 06/01/2021   Hypertension, essential 06/01/2021   Carotid stenosis 09/29/2020   Cerebrovascular disease 08/31/2020   History of CVA with residual deficit 08/31/2020   Edema 08/31/2020   Visual loss 08/19/2020   History of adenomatous polyp of colon 07/05/2019   Lymphadenopathy 03/31/2019   Vitamin B6 deficiency 11/28/2018   CAP (community acquired pneumonia) 06/28/2015   COPD exacerbation ([place]) 06/28/2015   HTN (hypertension) 06/28/2015   Back pain, chronic 12/24/2014   Duodenitis 12/24/2014   Esophagitis 12/24/2014   H/O surgical procedure 12/24/2014   HLD (hyperlipidemia) 12/24/2014   Headache, migraine 12/24/2014   Neuropathy 12/24/2014   Borderline diabetes 12/24/2014   Scoliosis 12/24/2014   Compulsive tobacco user syndrome 12/24/2014   Vitamin D deficiency 12/24/2014   Bell's palsy 12/24/2014   Polyneuropathy 12/24/2014   PCP:  Birdie Sons, MD Pharmacy:   [place], [place] [address] Phone: [phone] Fax: [phone]     Social  Determinants of Health (SDOH) Social History: SDOH Screenings   Food Insecurity: No Food Insecurity (05/24/2022)  Housing: Low Risk  (05/24/2022)  Transportation Needs: No Transportation Needs (05/24/2022)  Utilities: Not At Risk (01/24/2022)  Alcohol Screen: Low Risk  (05/06/2022)  Depression (PHQ2-9): Low Risk  (05/24/2022)  Recent Concern: Depression (PHQ2-9) - Medium Risk (05/06/2022)  Financial Resource Strain: Low Risk  (01/24/2022)  Physical Activity: Inactive (01/24/2022)  Social Connections: Moderately Isolated (01/24/2022)  Stress: No Stress Concern Present (01/24/2022)  Tobacco Use: High Risk (05/24/2022)   SDOH Interventions:     Readmission Risk Interventions     No data to display

## 2022-05-27 NOTE — Telephone Encounter (Signed)
Jesse Sans PT states that the patient missed his physical therapy appointment yesterday due to being in the hospital.

## 2022-05-27 NOTE — Progress Notes (Signed)
  Progress Note   Patient: Lucas Ramirez [id number] DOB: Dec 08, 1955 DOA: 05/26/2022     1 DOS: the patient was seen and examined on 05/27/2022   Brief hospital course:  Assessment and Plan:  Acute hypoxic respiratory failure due to COVID 19 virus  - BIPAP as needed, now on [place]  - IV remdesivir 100 mg daily  - IV dexamethasone 6 mg daily  - IV zosyn 3.375 g q8hr  - Duoneb q6 hr    Pneumonia  - Management as above  - F/u blood cx's     3. Acute metabolic encephalopathy  - Improving  - CT head showed a new acute - sub acute infarction of the anterior R upperlobe in the anterior MCA territory   - Likely due to the pt's COVID infection    4. Hx of CVA with new acute to sub acute CVA on CT imaging  - ASA 81 mg PO daily  - Plavix 75 mg PO daily - Crestor 20 mg PO daily     DVT prophylaxis: Lovenox 40 mg sq daily       Subjective: Pt seen and examined at the bedside. Pt's mentation is improved vs yesterday. He is asking for pain medication. CT head showed a new acute to subacute CVA. Called the pt's wife on the morning of 05/27/2022 and informed her of this new finding. Pt is already on aspirin, plavix and crestor. His prognosis is guarded. Continue with remdesivir and dexamethasone.   Physical Exam: Vitals:   05/27/22 0400 05/27/22 0500 05/27/22 0700 05/27/22 0800  BP: 129/81 139/83 (!) 149/99 (!) 174/88  Pulse: (!) 115 (!) 118 (!) 119 (!) 128  Resp: (!) 63  (!) 25 (!) 25  Temp: 98.8 F (37.1 C)   (!) 97 F (36.1 C)  TempSrc: Axillary   Oral  SpO2: 97% 99% 92% 94%  Weight:      Height:       Constitutional:      Appearance: He is ill-appearing.     Comments: Now on nasal cannula  HENT:     Head: Atraumatic.  Cardiovascular:     Rate and Rhythm: Tachycardia present.  Pulmonary:     Breath sounds: Rhonchi and rales present.  Abdominal:     General: Abdomen is flat.     Palpations: Abdomen is soft.  Musculoskeletal:        General: Normal range of motion.      Cervical back: Neck supple.  Skin:    General: Skin is warm.     Comments: Scattered bruises on either arms   Neurological:     Comments: Awake and alert Psychiatric:     Comments: Mood neutral    Data Reviewed:   Family Communication: Updated the wife on the morning of 05/27/2022 with the new CT head findings   Disposition: Status is: Inpatient  Planned Discharge Destination: Barriers to discharge: COVID infection affecting respiratory status     Time spent: 35 minutes  Author: Lucienne Minks , MD 05/27/2022 10:51 AM  For on call review [email or web address].

## 2022-05-28 DIAGNOSIS — U071 COVID-19: Secondary | ICD-10-CM | POA: Diagnosis not present

## 2022-05-28 LAB — COMPREHENSIVE METABOLIC PANEL
ALT: 29 U/L (ref 0–44)
AST: 33 U/L (ref 15–41)
Albumin: 2.3 g/dL — ABNORMAL LOW (ref 3.5–5.0)
Alkaline Phosphatase: 60 U/L (ref 38–126)
Anion gap: 7 (ref 5–15)
BUN: 19 mg/dL (ref 8–23)
CO2: 28 mmol/L (ref 22–32)
Calcium: 7.4 mg/dL — ABNORMAL LOW (ref 8.9–10.3)
Chloride: 102 mmol/L (ref 98–111)
Creatinine, Ser: 0.83 mg/dL (ref 0.61–1.24)
GFR, Estimated: >60 mL/min (ref >60)
Glucose, Bld: 87 mg/dL (ref 70–99)
Potassium: 4.1 mmol/L (ref 3.5–5.1)
Sodium: 137 mmol/L (ref 135–145)
Total Bilirubin: 0.6 mg/dL (ref 0.3–1.2)
Total Protein: 5.5 g/dL — ABNORMAL LOW (ref 6.5–8.1)

## 2022-05-28 LAB — CBC
HCT: 25.3 % — ABNORMAL LOW (ref 39.0–52.0)
Hemoglobin: 8 g/dL — ABNORMAL LOW (ref 13.0–17.0)
MCH: 27 pg (ref 26.0–34.0)
MCHC: 31.6 g/dL (ref 30.0–36.0)
MCV: 85.5 fL (ref 80.0–100.0)
Platelets: 438 10*3/uL — ABNORMAL HIGH (ref 150–400)
RBC: 2.96 MIL/uL — ABNORMAL LOW (ref 4.22–5.81)
RDW: 16.4 % — ABNORMAL HIGH (ref 11.5–15.5)
WBC: 9.9 10*3/uL (ref 4.0–10.5)
nRBC: 0 % (ref 0.0–0.2)

## 2022-05-28 LAB — BCR-ABL1 FISH
Cells Analyzed: 200
Cells Counted: 200

## 2022-05-28 LAB — MAGNESIUM: Magnesium: 1.9 mg/dL (ref 1.7–2.4)

## 2022-05-28 LAB — PHOSPHORUS: Phosphorus: 2.8 mg/dL (ref 2.5–4.6)

## 2022-05-28 LAB — C-REACTIVE PROTEIN: CRP: 5.5 mg/dL — ABNORMAL HIGH (ref <1.0)

## 2022-05-28 MED ORDER — GUAIFENESIN ER 600 MG PO TB12
600.0000 mg | ORAL_TABLET | Freq: Two times a day (BID) | ORAL | Status: DC
  Administered 2022-05-28 – 2022-06-03 (×13): 600 mg via ORAL
  Filled 2022-05-28 (×13): qty 1

## 2022-05-28 MED ORDER — SODIUM CHLORIDE 0.9 % IV SOLN: INTRAVENOUS | Status: DC | PRN

## 2022-05-28 MED ORDER — SODIUM CHLORIDE 0.9 % IV SOLN
100.0000 mg | Freq: Every day | INTRAVENOUS | Status: AC
  Administered 2022-05-28 – 2022-05-30 (×3): 100 mg via INTRAVENOUS
  Filled 2022-05-28 (×3): qty 20

## 2022-05-28 NOTE — Progress Notes (Signed)
Report called to [facility] around 5:30 pm. Pt transported to 2A via bed, on telemetry. Hand off to [place] at bedside.

## 2022-05-28 NOTE — Progress Notes (Signed)
  Progress Note   Patient: Lucas Ramirez [id number] DOB: 1955-12-07 DOA: 05/26/2022     2 DOS: the patient was seen and examined on 05/28/2022   Brief hospital course:  Assessment and Plan:  Acute hypoxic respiratory failure due to COVID 19 virus  - BIPAP as needed, now on [place] (titrate oxygen as tolerated)  - IV remdesivir 100 mg daily (extended to 5 days total) - IV dexamethasone 6 mg daily  - IV zosyn 3.375 g q8hr  - Combivent 2 puff q6 hr  - Mucinex 600 mg PO bid  - Vit C 500 mg PO bid  - MVI PO daily  - Zinc 220 mg PO daily    Pneumonia  - Management as above  - F/u blood cx's     3. Acute metabolic encephalopathy  - Improved - CT head showed a new acute - sub acute infarction of the anterior R upperlobe in the anterior MCA territory   - Likely due to the pt's COVID infection    4. Hx of CVA with new acute to sub acute CVA on CT imaging  - ASA 81 mg PO daily  - Plavix 75 mg PO daily - Crestor 20 mg PO daily    5. HTN  - Lisinopril 20 mg PO daily   6. Chronic pain  - Gabapentin 800 mg PO tid  - Oxycodone 5 mg PO q4 hr PRN    DVT prophylaxis: Lovenox 30 mg sq daily      Subjective: Pt seen and examined at the bedside. Mentation is improved. Clinically he is making slow positive progress. CRP down trending 9.8 --> 5.5. Remdesivir extended to 5 days as his chest/respiratory exam remains poor. He has intermittent hypoxia (89% @ 10 am and 86% @ 11 am).  Appreciate registered dietician consultation.   Physical Exam: Vitals:   05/28/22 0847 05/28/22 0900 05/28/22 1000 05/28/22 1100  BP: (!) 169/76 (!) 170/81 (!) 156/87 (!) 151/118  Pulse: 93 92 86 (!) 105  Resp: (!) 28 18 (!) 27 (!) 28  Temp: 98.2 F (36.8 C)     TempSrc: Oral     SpO2: 97% 99% (!) 89% (!) 86%  Weight:      Height:       Constitutional:      Appearance: Stable chronically ill     Comments: Now on nasal cannula  HENT:     Head: Atraumatic.  Cardiovascular:     Rate and Rhythm:  RRR Pulmonary:     Breath sounds: Rhonchi and rales present.  Abdominal:     General: Abdomen is flat.     Palpations: Abdomen is soft.  Musculoskeletal:        General: Normal range of motion.     Cervical back: Neck supple.  Skin:    General: Skin is warm.     Comments: Scattered bruises on either arms   Neurological:     Comments: Awake and alert Psychiatric:     Comments: Mood neutral    Data Reviewed:   Disposition: Status is: Inpatient  Planned Discharge Destination: Skilled nursing facility    Time spent: 35 minutes  Author: Lucienne Minks , MD 05/28/2022 12:08 PM  For on call review [email or web address].

## 2022-05-28 NOTE — Consult Note (Signed)
Remdesivir - Pharmacy Brief Note   Per discussion with MD, will extend course of remdesivir to 5d given minimal improvement in breathing sounds on exam. Orders have been updated to reflect new duration.  O2: 4L [place] LFTs WNL No new imaging available  Pharmacy will sign off and continue to monitor peripherally.  Gretel Acre, PharmD PGY1 Pharmacy Resident 05/28/2022 9:42 AM

## 2022-05-29 DIAGNOSIS — U071 COVID-19: Secondary | ICD-10-CM

## 2022-05-29 LAB — MAGNESIUM: Magnesium: 2.1 mg/dL (ref 1.7–2.4)

## 2022-05-29 LAB — COMPREHENSIVE METABOLIC PANEL
ALT: 24 U/L (ref 0–44)
AST: 20 U/L (ref 15–41)
Albumin: 2.3 g/dL — ABNORMAL LOW (ref 3.5–5.0)
Alkaline Phosphatase: 62 U/L (ref 38–126)
Anion gap: 5 (ref 5–15)
BUN: 24 mg/dL — ABNORMAL HIGH (ref 8–23)
CO2: 31 mmol/L (ref 22–32)
Calcium: 7.7 mg/dL — ABNORMAL LOW (ref 8.9–10.3)
Chloride: 101 mmol/L (ref 98–111)
Creatinine, Ser: 0.9 mg/dL (ref 0.61–1.24)
GFR, Estimated: >60 mL/min (ref >60)
Glucose, Bld: 80 mg/dL (ref 70–99)
Potassium: 4.3 mmol/L (ref 3.5–5.1)
Sodium: 137 mmol/L (ref 135–145)
Total Bilirubin: 0.5 mg/dL (ref 0.3–1.2)
Total Protein: 5.4 g/dL — ABNORMAL LOW (ref 6.5–8.1)

## 2022-05-29 LAB — CBC
HCT: 26.2 % — ABNORMAL LOW (ref 39.0–52.0)
Hemoglobin: 8.2 g/dL — ABNORMAL LOW (ref 13.0–17.0)
MCH: 26.7 pg (ref 26.0–34.0)
MCHC: 31.3 g/dL (ref 30.0–36.0)
MCV: 85.3 fL (ref 80.0–100.0)
Platelets: 521 10*3/uL — ABNORMAL HIGH (ref 150–400)
RBC: 3.07 MIL/uL — ABNORMAL LOW (ref 4.22–5.81)
RDW: 16.5 % — ABNORMAL HIGH (ref 11.5–15.5)
WBC: 9.3 10*3/uL (ref 4.0–10.5)
nRBC: 0 % (ref 0.0–0.2)

## 2022-05-29 LAB — C-REACTIVE PROTEIN: CRP: 3.6 mg/dL — ABNORMAL HIGH (ref <1.0)

## 2022-05-29 LAB — PHOSPHORUS: Phosphorus: 3.7 mg/dL (ref 2.5–4.6)

## 2022-05-29 NOTE — Progress Notes (Signed)
  Progress Note   Patient: Lucas Ramirez [id number] DOB: Jul 17, 1955 DOA: 05/26/2022     3 DOS: the patient was seen and examined on 05/29/2022   Brief hospital course:  Assessment and Plan:  Acute hypoxic respiratory failure due to COVID 19 virus  - BIPAP as needed, now on [place], wean as tolerated - IV remdesivir 100 mg daily (extended to 5 days total) - IV dexamethasone 6 mg daily  - IV zosyn 3.375 g q8hr  - Combivent 2 puff q6 hr  - Mucinex 600 mg PO bid  - Vit C 500 mg PO bid  - MVI PO daily  - Zinc 220 mg PO daily    Pneumonia  - Management as above  - F/u blood cx's     3. Acute metabolic encephalopathy  - Improved - CT head showed a new acute - sub acute infarction of the anterior R upperlobe in the anterior MCA territory   - Likely due to the pt's COVID infection    4. Hx of CVA with new acute to sub acute CVA on CT imaging  - ASA 81 mg PO daily  - Plavix 75 mg PO daily - Crestor 20 mg PO daily   -Will consult PT/OT/SLP today  5. HTN  - Lisinopril 20 mg PO daily   6. Chronic pain  - Gabapentin 800 mg PO tid  - Oxycodone 5 mg PO q4 hr PRN    DVT prophylaxis: Lovenox 30 mg sq daily   Subjective:  Patient seen and examined this morning in his room.  No family at the bedside.  Is awake alert oriented.  Mentation seems improved.  He is resting comfortably on 2 L nasal cannula oxygen, says that he feels well overall, except for some global weakness.  Physical Exam: Vitals:   05/28/22 2100 05/28/22 2257 05/29/22 0317 05/29/22 0949  BP: (!) 144/83 131/70 106/70 111/65  Pulse: 95 93 96 (!) 116  Resp: '20 18 20   '$ Temp: 97.9 F (36.6 C) 98.1 F (36.7 C) 98.3 F (36.8 C)   TempSrc: Oral Oral Oral   SpO2: 98% 100% 98% (!) 86%  Weight:      Height:      General:  Alert, oriented, calm, in no acute distress, resting comfortably on 2 L nasal cannula oxygen.  He is very cachectic, weak appearing but not toxic in appearance Eyes: EOMI, clear conjuctivae, white  sclerea Neck: supple, no masses, trachea mildline  Cardiovascular: RRR, no murmurs or rubs, no peripheral edema  Respiratory: clear to auscultation bilaterally, no wheezes, no crackles  Abdomen: soft, nontender, nondistended, normal bowel tones heard  Skin: dry, no rashes  Musculoskeletal: no joint effusions, normal range of motion  Psychiatric: appropriate affect, normal speech  Neurologic: extraocular muscles intact, clear speech, moving all extremities with intact sensorium  Disposition: Status is: Inpatient  Planned Discharge Destination: Skilled nursing facility  Time spent: 35 minutes  Author: [place] Rooke Neva Seat, MD 05/29/2022 9:53 AM  For on call review [email or web address].

## 2022-05-29 NOTE — Evaluation (Signed)
Occupational Therapy Evaluation Patient Details Name: Lucas Ramirez MRN: [id number] DOB: 1956/02/25 Today's Date: 05/29/2022   History of Present Illness [name] is a 67 y.o. male past medical history of COPD not on home oxygen, CHF, stroke, Bell's palsy, chronic back pain, hypertension and hyperlipidemia, tobacco use who presents to the emergency department with cough, shortness of breath, fever. CT head showed  "New, acute to subacute appearing infarction of the more anterior right upper lobe in the anterior MCA territory."   Clinical Impression   Patient presenting with decreased independence in self care, balance, functional mobility/transfers, endurance, and safety awareness. PTA pt lived with family and received assistance for all ADLs, IADLs, and functional mobility. Pt has a PCA that comes Mon-Fri for 4-8 hours/day. Pt currently functioning at Max-Total A for bed mobility, Max A for LB dressing, and set up A for bed level self-feeding/grooming tasks with multimodal cues. Pt has great support at home with 24/7 supervision/assistance available. Wife endorsed wanting to take pt home. If pt discharges home, he will benefit from [facility], hospital bed, and hoyer lift. Will continue to follow acutely.    Recommendations for follow up therapy are one component of a multi-disciplinary discharge planning process, led by the attending physician.  Recommendations may be updated based on patient status, additional functional criteria and insurance authorization.   Follow Up Recommendations  Home health OT     Assistance Recommended at Discharge Frequent or constant Supervision/Assistance  Patient can return home with the following A lot of help with bathing/dressing/bathroom;Direct supervision/assist for financial management;Direct supervision/assist for medications management;Assistance with cooking/housework;Assist for transportation;Help with stairs or ramp for entrance;A lot of help with walking  and/or transfers    Functional Status Assessment  Patient has had a recent decline in their functional status and demonstrates the ability to make significant improvements in function in a reasonable and predictable amount of time.  Equipment Recommendations  Hospital bed;Other (comment) (hoyer lift)    Recommendations for Other Services       Precautions / Restrictions Precautions Precautions: Fall Restrictions Weight Bearing Restrictions: No      Mobility Bed Mobility Overal bed mobility: Needs Assistance Bed Mobility: Supine to Sit, Sit to Supine     Supine to sit: Max assist, HOB elevated Sit to supine: Max assist   General bed mobility comments: Total A to scoot pt up toward [place] transfer comment: unable/unsafe to attempt at this time      Balance Overall balance assessment: Needs assistance Sitting-balance support: Feet supported, Bilateral upper extremity supported Sitting balance-Leahy Scale: Poor Sitting balance - Comments: Min guard for static sitting balance at best, pt fatigued quickly and requested to lay back down       Standing balance comment: unable/unsafe to attempt at this time                           ADL either performed or assessed with clinical judgement   ADL Overall ADL's : Needs assistance/impaired Eating/Feeding: Set up;Bed level;Cueing for sequencing Eating/Feeding Details (indicate cue type and reason): assist for cutting up food and placing food onto fork, pt able to bring fork to mouth in order to take a bite of food Grooming: Set up;Bed level;Cueing for sequencing Grooming Details (indicate cue type and reason): placed washcloth on R hand then pt  able to wipe food off both side of mouth         Upper Body Dressing : Maximal assistance;Sitting   Lower Body Dressing: Maximal assistance;Bed level Lower Body Dressing Details (indicate cue type and reason): socks                      Vision   Additional Comments: Pt keeping R eye closed t/o (due to Bells palsy?), unable to open on command. Unable to visually track stimulus with L eye. Able to find items on plate while completing self-feeding with VC.     Perception     Praxis      Pertinent Vitals/Pain Pain Assessment Pain Assessment: No/denies pain     Hand Dominance Right   Extremity/Trunk Assessment Upper Extremity Assessment Upper Extremity Assessment: Generalized weakness (Poor grip strength, +dysmetria with FTN. RUE weakness appears greater than L, however, difficult to fully assess 2/2 impaired cognition. Difficulty with "place/hold" of all extremities 2/2 significant weakness.)   Lower Extremity Assessment Lower Extremity Assessment: Generalized weakness       Communication Communication Communication: HOH   Cognition Arousal/Alertness: Awake/alert Behavior During Therapy: Flat affect Overall Cognitive Status: History of cognitive impairments - at baseline                                 General Comments: Pt oriented to self, " regional", and year. Mulitmodal cues for single step commands, increased time/repetition required     General Comments  poor skin integrity    Exercises Other Exercises Other Exercises: OT provided education re: role of OT, OT POC, post acute recs, sitting up for all meals, EOB/OOB mobility with assistance, home/fall safety.    Shoulder Instructions      Home Living Family/patient expects to be discharged to:: Private residence Living Arrangements: Spouse/significant other;Other relatives (SIL who has down's syndrom, 2 adult sons-in-law) Available Help at Discharge: Family;Personal care attendant;Available 24 hours/day Type of Home: House Home Access: Stairs to enter [employer] of Steps: 4 Entrance Stairs-Rails: Left Home Layout: One level     Bathroom Shower/Tub: Occupational psychologist:  Handicapped height     Home Equipment: Rollator (4 wheels);BSC/3in1;Wheelchair - manual;Tub bench;Shower seat;Hand held shower head   Additional Comments: PLOF/history obtained from wife. Pt was receiving HHPT prior to admission.      Prior Functioning/Environment Prior Level of Function : History of Falls (last six months);Needs assist             Mobility Comments: Wife reports pt has not walked in ~6 months, pt's son will pick him up & place him in [place], assist for mwc propulsion, sleeps in recliner, multiple falls recently ADLs Comments: Wife reports pt receives assistance for all ADLs/IADLs. Has PCA Mon-Fri for 4-8 hours/day to assist with ADLs/IADLs as needed. Set up A for self-feeding.        OT Problem List: Decreased strength;Decreased range of motion;Decreased activity tolerance;Impaired balance (sitting and/or standing);Decreased cognition;Decreased safety awareness;Decreased coordination;Cardiopulmonary status limiting activity;Decreased knowledge of use of DME or AE;Decreased knowledge of precautions;Impaired vision/perception      OT Treatment/Interventions: Self-care/ADL training;Therapeutic exercise;Therapeutic activities;Splinting;Cognitive remediation/compensation;DME and/or AE instruction;Patient/family education;Balance training;Energy conservation;Visual/perceptual remediation/compensation    OT Goals(Current goals can be found in the care plan section) Acute Rehab OT Goals Patient Stated Goal: wife wants to take pt home OT Goal Formulation: With family Time For Goal Achievement: 06/12/22 Potential to  Achieve Goals: Fair   OT Frequency: Min 2X/week    Co-evaluation              AM-PAC OT "6 Clicks" Daily Activity     Outcome Measure Help from another person eating meals?: A Little Help from another person taking care of personal grooming?: A Little Help from another person toileting, which includes using toliet, bedpan, or urinal?: A Lot Help from  another person bathing (including washing, rinsing, drying)?: A Lot Help from another person to put on and taking off regular upper body clothing?: A Lot Help from another person to put on and taking off regular lower body clothing?: A Lot 6 Click Score: 14   End of Session Equipment Utilized During Treatment: Oxygen Nurse Communication: Mobility status  Activity Tolerance: Patient limited by fatigue Patient left: in bed;with call bell/phone within reach;with bed alarm set  OT Visit Diagnosis: Other abnormalities of gait and mobility (R26.89);Muscle weakness (generalized) (M62.81);History of falling (Z91.81);Other symptoms and signs involving the nervous system (R29.898)                Time: 7510-2585 OT Time Calculation (min): 27 min Charges:  OT General Charges $OT Visit: 1 Visit OT Evaluation $OT Eval Moderate Complexity: 1 Mod  [facility] MS, OTR/L ascom [phone]  05/29/22, 6:55 PM

## 2022-05-29 NOTE — Evaluation (Addendum)
Clinical/Bedside Swallow Evaluation Patient Details  Name: Lucas Ramirez MRN: [id number] Date of Birth: August 02, 1955  Today's Date: 05/29/2022 Time: SLP Start Time (ACUTE ONLY): 1250 SLP Stop Time (ACUTE ONLY): 1302 SLP Time Calculation (min) (ACUTE ONLY): 12 min  Past Medical History:  Past Medical History:  Diagnosis Date   Bell's palsy    CAP (community acquired pneumonia) 06/28/2015   CVA (cerebral vascular accident) ([place])    Depression    GERD (gastroesophageal reflux disease)    Glaucoma    Hypertension    Migraine    Scoliosis    Shingles 2019   dx by dermatologist by patient report   Past Surgical History:  Past Surgical History:  Procedure Laterality Date   [place]   correcting Scoliosils per pt   COLONOSCOPY WITH PROPOFOL N/A 07/04/2019   Procedure: COLONOSCOPY WITH PROPOFOL;  Surgeon: Lin Landsman, MD;  Location: [place];  Service: Gastroenterology;  Laterality: N/A;  PRIORITY 4   LUMBAR LAMINECTOMY/ DECOMPRESSION WITH MET-RX Right 10/30/2019   Procedure: L4-5 DECOMPRESSION;  Surgeon: Meade Maw, MD;  Location: [facility];  Service: Neurosurgery;  Laterality: Right;   SKIN LESION EXCISION  06/06/2013   facial left cheek. Dr. Evorn Gong   UPPER GI ENDOSCOPY  07/11/2008   [place]. Dr. Donnella Sham. Impression. -LA Grade C reflux esophagitis. 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   HPI:  Per H&P "Lucas D Strength is a 67 y.o. male  who presents from home for SOB and hallucinations. Pt is on BIPAP and not talking at this time. Thus, the pt's wife was called to provide the history (Lucas Ramirez [phone]). Wife reports that since the pt was discharged from the hospital in Dec 2023 he has had home health visiting the house for physical therapy needs.  However, the PT personnel was apparently COVID + herself. The pt's wife reports that she attempted to bring the pt to the hospital 2 weeks ago but the pt himself refused an ambulance and refused  a visit to the PCP).  Pt has had cough, fever and chills at home.     However, today the pt's vitals were irregular (HR 106 and BP 160/90) with a fever at home. Thus, today when the wife called the ambulance the pt did not refuse the ambulance. 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 He was confirmed COVID + in the ER today 05/26/2022.      Spoke with the wife on the phone today regarding the goals of care given the pt's current presentation, BIPAP requirements, lack of mentation/situational awareness and co-morbidities. The wife clearly advised that Lucas Ramirez is a DNR/DNI and she would like him to remain a DNR/DNI for this admission. The wife was clearly advised of the high possibility of decompensation and death for Lucas Ramirez. She understood that death was a possibility. Pt will be admitted to the medicine service for further management." Head CT, 05/26/22, "1. 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  2. Previously seen subacute to chronic infarction of the posterior  right upper lobe." CXR, 05/26/22, "Small right-greater-than-left pleural effusions and bibasilar  atelectasis."    Assessment / Plan / Recommendation  Clinical Impression  Pt seen for clinical swallowing evaluation. Pt lethargic. Resting comfortably on 2L/min O2 via [place]. Cachectic appearing. Cleared with RN.  Pt known to SLP services from  previous admissions. Most recently seen, 04/16/22, with recommendation for regular diet with thin liquids and safe swallowing strategies/aspiration precautions.  Pt with inconsistent ability to follow directions during oral motor examination. Noted kept R eye closed. Reduced facial ROM on L with labial retraction. Xerostomia appreciated. Reduced lingual ROM and cough noted - ?effort.   Attempted trials of thin liquids (via tsp, cup, straw), pureed, and solids. Pt declined trials of  purees and solids despite cueing. Pt with s/sx at least a mild oral dysphagia c/b inability to siphon thin liquids via straw which appeared due to reduced labial seal in setting of lethargy.   Per RN, pt tolerating current diet without overt s/sx pharyngeal dysphagia.   Recommend cautious continuation of current diet with safe swallowing strategies/aspiration precautions as outlined below.   Pt is at increased risk for aspiration/aspiration PNA given dental status, fluctuating LOA, overall mental status, and multiple comorbidities.   Should pt exhibit increased difficulty chewing/swallowing or demonstrate changes to pulmonary status concerning for aspiration/aspiration PNA, please adjust diet as clinically indicated and notify SLP.   Concern for pt's ability to meet nutriotional needs orally. Noted RD following. Consider Palliative Care consult.   Given limited nature of today's evaluation, SLP to f/u for further clinical swallowing re-evaluation.    SLP Visit Diagnosis: Dysphagia, unspecified (R13.10)    Aspiration Risk  Mild aspiration risk;Moderate aspiration risk    Diet Recommendation  (will continue current diet pending further SLP evaluation)   Medication Administration: Crushed with puree Supervision: Patient able to self feed;Staff to assist with self feeding;Full supervision/cueing for compensatory strategies Compensations: Minimize environmental distractions;Slow rate;Small sips/bites    Other  Recommendations Recommended Consults:  (RD f/u; Palliative Care consult) Oral Care Recommendations: Oral care QID;Staff/trained caregiver to provide oral care    Recommendations for follow up therapy are one component of a multi-disciplinary discharge planning process, led by the attending physician.  Recommendations may be updated based on patient status, additional functional criteria and insurance authorization.  Follow up Recommendations Skilled nursing-short term rehab (<3  hours/day)         Functional Status Assessment Patient has had a recent decline in their functional status and demonstrates the ability to make significant improvements in function in a reasonable and predictable amount of time.  Frequency and Duration min 2x/week  2 weeks       Prognosis Prognosis for Safe Diet Advancement: Fair Barriers to Reach Goals:  (CLOF; comorbidities)      Swallow Study   General Date of Onset: 05/26/22 HPI: Per H&P "Lucas Ramirez is a 66 y.o. male  who presents from home for SOB and hallucinations. Pt is on BIPAP and not talking at this time. Thus, the pt's wife was called to provide the history (Lucas Ramirez [phone]). Wife reports that since the pt was discharged from the hospital in Dec 2023 he has had home health visiting the house for physical therapy needs.  However, the PT personnel was apparently COVID + herself. The pt's wife reports that she attempted to bring the pt to the hospital 2 weeks ago but the pt himself refused an ambulance and refused a visit to the PCP).  Pt has had cough, fever and chills at home.     However, today the pt's vitals were irregular (HR 106 and BP 160/90) with a fever at home. Thus, today when the wife called the ambulance the pt did not refuse the ambulance. 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 He was confirmed COVID + in the ER today 05/26/2022.      Spoke with the wife on the phone today regarding the goals of care given the pt's current presentation, BIPAP requirements, lack of mentation/situational awareness and co-morbidities. The wife clearly advised that Lucas Ramirez is a DNR/DNI and she would like him to remain a DNR/DNI for this admission. The wife was clearly advised of the high possibility of decompensation and death for Lucas Ramirez. She understood that death was a possibility. Pt will be admitted to the medicine service for further management." Head CT, 05/26/22, "1. New,  acute to subacute appearing infarction of the more anterior  right upper lobe in the anterior MCA territory.  2. Previously seen subacute to chronic infarction of the posterior  right upper lobe." CXR, 05/26/22, "Small right-greater-than-left pleural effusions and bibasilar  atelectasis." Type of Study: Bedside Swallow Evaluation Previous Swallow Assessment: known to SLP services; most recently seen for clinical swallowing evaluation, 12/23, with recommendation for a regular diet with thin liquids Diet Prior to this Study: Regular;Thin liquids (chopped meats) Temperature Spikes Noted: No Respiratory Status: Nasal cannula (2L/min) Behavior/Cognition: Lethargic/Drowsy Oral Cavity Assessment: Dry Oral Care Completed by SLP: Yes Vision:  (did not attempt to feed self) Self-Feeding Abilities: Needs assist Patient Positioning: Upright in bed Baseline Vocal Quality: Low vocal intensity Volitional Cough: Cognitively unable to elicit Volitional Swallow: Unable to elicit    Oral/Motor/Sensory Function Overall Oral Motor/Sensory Function: Moderate impairment (unable to assess fully) Facial ROM:  (kept R eye closed; L facial droop) Lingual ROM: Reduced right;Reduced left (vs effort)   Ice Chips     Thin Liquid Thin Liquid: Impaired Presentation: Cup;Spoon;Straw Oral Phase Impairments: Reduced labial seal Oral Phase Functional Implications:  (inability to siphon liquids from straw) Pharyngeal  Phase Impairments:  (appeared WFL)    Nectar Thick Nectar Thick Liquid: Not tested   Honey Thick Honey Thick Liquid: Not tested   Puree Puree: Not tested (pt declined)   Solid     Solid: Not tested (pt declined)     Lucas Ramirez, M.S., [facility] [phone] Lucas Ramirez)  Lucas Ramirez 05/29/2022,1:33 PM

## 2022-05-30 DIAGNOSIS — U071 COVID-19: Secondary | ICD-10-CM | POA: Diagnosis not present

## 2022-05-30 LAB — URINALYSIS, W/ REFLEX TO CULTURE (INFECTION SUSPECTED)
Bacteria, UA: NONE SEEN
Bilirubin Urine: NEGATIVE
Glucose, UA: NEGATIVE mg/dL
Hgb urine dipstick: NEGATIVE
Ketones, ur: NEGATIVE mg/dL
Leukocytes,Ua: NEGATIVE
Nitrite: NEGATIVE
Protein, ur: NEGATIVE mg/dL
Specific Gravity, Urine: 1.021 (ref 1.005–1.030)
Squamous Epithelial / HPF: NONE SEEN /HPF (ref 0–5)
pH: 5 (ref 5.0–8.0)

## 2022-05-30 LAB — MULTIPLE MYELOMA PANEL, SERUM
Albumin SerPl Elph-Mcnc: 2.6 g/dL — ABNORMAL LOW (ref 2.9–4.4)
Albumin/Glob SerPl: 0.9 (ref 0.7–1.7)
Alpha 1: 0.4 g/dL (ref 0.0–0.4)
Alpha2 Glob SerPl Elph-Mcnc: 1 g/dL (ref 0.4–1.0)
B-Globulin SerPl Elph-Mcnc: 0.8 g/dL (ref 0.7–1.3)
Gamma Glob SerPl Elph-Mcnc: 0.8 g/dL (ref 0.4–1.8)
Globulin, Total: 3.1 g/dL (ref 2.2–3.9)
IgA: 204 mg/dL (ref 61–437)
IgG (Immunoglobin G), Serum: 937 mg/dL (ref 603–1613)
IgM (Immunoglobulin M), Srm: 38 mg/dL (ref 20–172)
Total Protein ELP: 5.7 g/dL — ABNORMAL LOW (ref 6.0–8.5)

## 2022-05-30 LAB — BASIC METABOLIC PANEL
Anion gap: 8 (ref 5–15)
BUN: 28 mg/dL — ABNORMAL HIGH (ref 8–23)
CO2: 29 mmol/L (ref 22–32)
Calcium: 7.9 mg/dL — ABNORMAL LOW (ref 8.9–10.3)
Chloride: 98 mmol/L (ref 98–111)
Creatinine, Ser: 0.85 mg/dL (ref 0.61–1.24)
GFR, Estimated: >60 mL/min (ref >60)
Glucose, Bld: 97 mg/dL (ref 70–99)
Potassium: 4.3 mmol/L (ref 3.5–5.1)
Sodium: 135 mmol/L (ref 135–145)

## 2022-05-30 LAB — CBC
HCT: 26.3 % — ABNORMAL LOW (ref 39.0–52.0)
Hemoglobin: 8.2 g/dL — ABNORMAL LOW (ref 13.0–17.0)
MCH: 26.3 pg (ref 26.0–34.0)
MCHC: 31.2 g/dL (ref 30.0–36.0)
MCV: 84.3 fL (ref 80.0–100.0)
Platelets: 532 10*3/uL — ABNORMAL HIGH (ref 150–400)
RBC: 3.12 MIL/uL — ABNORMAL LOW (ref 4.22–5.81)
RDW: 16.5 % — ABNORMAL HIGH (ref 11.5–15.5)
WBC: 12.6 10*3/uL — ABNORMAL HIGH (ref 4.0–10.5)
nRBC: 0 % (ref 0.0–0.2)

## 2022-05-30 MED ORDER — GABAPENTIN 400 MG PO CAPS
800.0000 mg | ORAL_CAPSULE | Freq: Three times a day (TID) | ORAL | Status: DC | PRN
  Administered 2022-05-30 – 2022-05-31 (×2): 800 mg via ORAL
  Filled 2022-05-30 (×2): qty 2

## 2022-05-30 NOTE — Progress Notes (Signed)
  Progress Note   Patient: Lucas Ramirez GMW:[id number] DOB: May 18, 1955 DOA: 05/26/2022     4 DOS: the patient was seen and examined on 05/30/2022   Brief hospital course:  Assessment and Plan:  Acute hypoxic respiratory failure due to COVID 19 virus  - BIPAP as needed, now on Circle 3L, wean as tolerated - IV remdesivir 100 mg daily (extended to 5 days total, will end today) - IV dexamethasone 6 mg daily  - IV zosyn 3.375 g q8hr  - Combivent 2 puff q6 hr  - Mucinex 600 mg PO bid  - Vit C 500 mg PO bid  - MVI PO daily  - Zinc 220 mg PO daily    Pneumonia  - Management as above  - F/u blood cx's     3. Acute metabolic encephalopathy, resolved - CT head showed a new acute - sub acute infarction of the anterior R upperlobe in the anterior MCA territory   - Likely due to the pt's COVID infection    4. Hx of CVA with new acute to sub acute CVA on CT imaging  - ASA 81 mg PO daily  - Plavix 75 mg PO daily - Crestor 20 mg PO daily   -OT recommends HH, awaiting PT eval today  5. HTN  - Lisinopril 20 mg PO daily   6. Chronic pain  - Gabapentin 800 mg PO tid  - Oxycodone 5 mg PO q4 hr PRN    DVT prophylaxis: Lovenox 30 mg sq daily   Subjective:  Patient seen and examined this morning in his room, no family present. He is looking for energetic and comfortable today.  Physical Exam: Vitals:   05/29/22 1947 05/29/22 2335 05/30/22 0343 05/30/22 0847  BP: 120/83 112/69 120/75 129/70  Pulse: (!) 110 (!) 109 95 (!) 101  Resp: '19 18 18 '$ (!) 24  Temp: 98.2 F (36.8 C) 98.2 F (36.8 C) (!) 97.4 F (36.3 C) 98 F (36.7 C)  TempSrc:  Oral Oral Oral  SpO2: 90% 98% 98% 94%  Weight:      Height:      General:  Alert, oriented, calm, in no acute distress, cachectic, on 3L [place] Eyes: EOMI, clear conjuctivae, white sclerea Neck: supple, no masses, trachea mildline  Cardiovascular: RRR, no murmurs or rubs, no peripheral edema  Respiratory: clear to auscultation bilaterally, no wheezes, no  crackles  Abdomen: soft, nontender, nondistended, normal bowel tones heard  Skin: dry, no rashes  Musculoskeletal: no joint effusions, normal range of motion  Psychiatric: appropriate affect, normal speech  Neurologic: extraocular muscles intact, clear speech, moving all extremities with intact sensorium  Disposition: Status is: Inpatient  Planned Discharge Destination: Likely HH.  Time spent: 35 minutes  Author: Chael Urenda Neva Seat, MD 05/30/2022 9:04 AM  For on call review [email or web address].

## 2022-05-30 NOTE — TOC Progression Note (Signed)
Transition of Care [facility]) - Progression Note    Patient Details  Name: Lucas Ramirez MRN: [id number] Date of Birth: [birth year]  [facility] CM/SW Contact  Laurena Slimmer, RN Phone Number: 05/30/2022, 2:35 PM  Clinical Narrative:    Damaris Schooner with Wells Guiles from [facility]. She was updated with estimated discharge date and needed DME for hospital bed, oxygen, and hoyer lift. Wells Guiles stated oxygen is automatically sent. RNCM confirmed patient's contact's number.    Expected Discharge Plan: Home w Hospice Care Barriers to Discharge: Continued Medical Work up  Expected Discharge Plan and Services   Discharge Planning Services: CM Consult Post Acute Care Choice: Hospice Living arrangements for the past 2 months: Single Family Home                           HH Arranged: RN, PT [facility] Agency: [place] Date [place]: 05/27/22 Time [place]: [place] Representative spoke with at [place]: [place] ([place]) Interventions SDOH Screenings   Food Insecurity: No Food Insecurity (05/24/2022)  Housing: Low Risk  (05/24/2022)  Transportation Needs: No Transportation Needs (05/24/2022)  Utilities: Not At Risk (01/24/2022)  Alcohol Screen: Low Risk  (05/06/2022)  Depression (PHQ2-9): Low Risk  (05/24/2022)  Recent Concern: Depression (PHQ2-9) - Medium Risk (05/06/2022)  Financial Resource Strain: Low Risk  (01/24/2022)  Physical Activity: Inactive (01/24/2022)  Social Connections: Moderately Isolated (01/24/2022)  Stress: No Stress Concern Present (01/24/2022)  Tobacco Use: High Risk (05/24/2022)    Readmission Risk Interventions     No data to display

## 2022-05-30 NOTE — Progress Notes (Signed)
Speech Language Pathology Treatment: Dysphagia  Patient Details Name: Lucas Ramirez MRN: [id number] DOB: [birth year] Today's Date: 05/30/2022 Time: 1601-0932 SLP Time Calculation (min) (ACUTE ONLY): 15 min  Assessment / Plan / Recommendation Clinical Impression  Pt seen today for dysphagia treatment/diet tolerance. Pt sitting upright in chair upon SLP arrival. Pt alert and cooperative for duration of session. Pt left sitting in chair w/ call button in reach.  Pt on 3L O2 via [place]; afebrile; WBC elevated.  Pt does not appear to present w/ any overt s/s of oropharyngeal dysphagia. Noted pt at slightly increased risk of aspiration/aspiration pneumonia d/t respiratory status.  Administered trials of thin liquids (~2oz), puree (x6 bites), and solids (1 cracker). During oral phase, pt exhibited appropriate bolus control/management, timely A-P bolus transit, and clear oral cavity post swallow. During pharyngeal phase, pt exhibited seemingly timely pharyngeal swallow and clear vocal quality post-trials.  Noted increased SOB b/t and after po's, particularly w/ trials of thin liquids. Pt reported feeling out breath occasionally while eating/drinking. SLP recommended frequent breaks between bites/sips and small bites/sips to address swallowing in setting of decreased respiratory status.  Pt educated on aspiration precautions, diet recommendation, and SLP POC. Pt agreed and appreciative.  Recommend continue regular diet w/ thin liquids -- monitor straw use. Recommend meals w/ GENERAL aspiration precautions d/t respiratory status. RN/pt updated. SLP will monitor for progress in next 1-2 days.   HPI HPI: Per H&P "Lucas Ramirez is a 67 y.o. male  who presents from home for SOB and hallucinations. Pt is on BIPAP and not talking at this time. Thus, the pt's wife was called to provide the history (Lucas Ramirez [phone]). Wife reports that since the pt was discharged from the hospital in Dec 2023 he has had home health  visiting the house for physical therapy needs.  However, the PT personnel was apparently COVID + herself. The pt's wife reports that she attempted to bring the pt to the hospital 2 weeks ago but the pt himself refused an ambulance and refused a visit to the PCP).  Pt has had cough, fever and chills at home.     However, today the pt's vitals were irregular (HR 106 and BP 160/90) with a fever at home. Thus, today when the wife called the ambulance the pt did not refuse the ambulance. 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 He was confirmed COVID + in the ER today 05/26/2022.      Spoke with the wife on the phone today regarding the goals of care given the pt's current presentation, BIPAP requirements, lack of mentation/situational awareness and co-morbidities. The wife clearly advised that Lucas Ramirez is a DNR/DNI and she would like him to remain a DNR/DNI for this admission. The wife was clearly advised of the high possibility of decompensation and death for Lucas Ramirez. She understood that death was a possibility. Pt will be admitted to the medicine service for further management." Head CT, 05/26/22, "1. 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  2. Previously seen subacute to chronic infarction of the posterior  right upper lobe." CXR, 05/26/22, "Small right-greater-than-left pleural effusions and bibasilar  atelectasis."      SLP Plan  Continue with current plan of care      Recommendations for follow up therapy are one component of a multi-disciplinary discharge planning process, led  by the attending physician.  Recommendations may be updated based on patient status, additional functional criteria and insurance authorization.    Recommendations  Diet recommendations: Regular;Thin liquid Liquids provided via: Cup Medication Administration: Crushed with puree Supervision: Patient  able to self feed Compensations: Minimize environmental distractions;Slow rate;Small sips/bites Postural Changes and/or Swallow Maneuvers: Seated upright 90 degrees;Out of bed for meals;Upright 30-60 min after meal                Oral Care Recommendations: Oral care QID;Patient independent with oral care Follow Up Recommendations: Skilled nursing-short term rehab (<3 hours/day) Assistance recommended at discharge: Intermittent Supervision/Assistance SLP Visit Diagnosis: Dysphagia, unspecified (R13.10) Plan: Continue with current plan of care          [place], Speech Pathology   Randall Hiss  05/30/2022, 1:55 PM
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
## 2022-05-31 ENCOUNTER — Inpatient Hospital Stay: Payer: Medicare PPO

## 2022-05-31 ENCOUNTER — Telehealth: Payer: Self-pay

## 2022-05-31 DIAGNOSIS — U071 COVID-19: Secondary | ICD-10-CM | POA: Diagnosis not present

## 2022-05-31 LAB — CULTURE, BLOOD (ROUTINE X 2)
Culture: NO GROWTH
Culture: NO GROWTH
Special Requests: ADEQUATE
Special Requests: ADEQUATE

## 2022-05-31 LAB — CBC
HCT: 26.9 % — ABNORMAL LOW (ref 39.0–52.0)
Hemoglobin: 8.5 g/dL — ABNORMAL LOW (ref 13.0–17.0)
MCH: 26.6 pg (ref 26.0–34.0)
MCHC: 31.6 g/dL (ref 30.0–36.0)
MCV: 84.3 fL (ref 80.0–100.0)
Platelets: 618 10*3/uL — ABNORMAL HIGH (ref 150–400)
RBC: 3.19 MIL/uL — ABNORMAL LOW (ref 4.22–5.81)
RDW: 16.7 % — ABNORMAL HIGH (ref 11.5–15.5)
WBC: 19 10*3/uL — ABNORMAL HIGH (ref 4.0–10.5)
nRBC: 0 % (ref 0.0–0.2)

## 2022-05-31 LAB — BASIC METABOLIC PANEL
Anion gap: 8 (ref 5–15)
BUN: 26 mg/dL — ABNORMAL HIGH (ref 8–23)
CO2: 32 mmol/L (ref 22–32)
Calcium: 8.1 mg/dL — ABNORMAL LOW (ref 8.9–10.3)
Chloride: 96 mmol/L — ABNORMAL LOW (ref 98–111)
Creatinine, Ser: 0.85 mg/dL (ref 0.61–1.24)
GFR, Estimated: >60 mL/min (ref >60)
Glucose, Bld: 70 mg/dL (ref 70–99)
Potassium: 4.6 mmol/L (ref 3.5–5.1)
Sodium: 136 mmol/L (ref 135–145)

## 2022-05-31 NOTE — Telephone Encounter (Signed)
(  4pm): Palliative care outreach to patients spouse, Jenny Reichmann, to discuss patients hospital course and cancel/reschedule upcoming initial PC visit.  Patient anticipated to DC home on Thu. SW to f/u again on Friday with spouse to reschedule visit.

## 2022-05-31 NOTE — Progress Notes (Addendum)
  Progress Note   Patient: Lucas Ramirez LKJ:[id number] DOB: 02/23/1956 DOA: 05/26/2022     5 DOS: the patient was seen and examined on 05/31/2022   Brief Hospital Course: [phone] with hx of CVA, GERD, depression, HTN, cachexia of unclear etiology who is DNR and admitted with hypoxic respiratory failure due to [place]. He is on room air at baseline, and remains on 3L.   Today 2/6 CXR reveals large R pleural effusion.  Assessment and Plan:  Acute hypoxic respiratory failure due to COVID 19 virus  Large R pleural effusion - BIPAP as needed, now on [place] 3L, wean as tolerated - IR consult placed for US thoracentesis, with labs ordered - IV remdesivir 100 mg daily (extended to 5 days total, ended 2/5) - IV dexamethasone 6 mg daily  - IV zosyn 3.375 g q8hr  - Combivent 2 puff q6 hr  - Mucinex 600 mg PO bid  - Vit C 500 mg PO bid  - MVI PO daily  - Zinc 220 mg PO daily    Pneumonia suspected CAP due to unknown organism - Management as above  - F/u blood cx's     3. Acute metabolic encephalopathy, resolved - CT head showed a new acute - sub acute infarction of the anterior R upperlobe in the anterior MCA territory   - Likely due to the pt's COVID infection    4. Hx of CVA with new acute to sub acute CVA on CT imaging  - ASA 81 mg PO daily  - Plavix 75 mg PO daily - Crestor 20 mg PO daily   - OT recommends HH, awaiting PT eval today  5. HTN  - Lisinopril 20 mg PO daily   6. Chronic pain  - Gabapentin 800 mg PO tid  - Oxycodone 5 mg PO q4 hr PRN   7. Severe malnutrition   DVT prophylaxis: Lovenox 30 mg sq daily   Subjective:  Patient seen and examined this morning in his room, remains on 3L. Eating breakfast this AM, has no complaints.  Physical Exam: Vitals:   05/30/22 1631 05/30/22 2108 05/31/22 0046 05/31/22 0641  BP: 131/66 126/66 123/68 130/70  Pulse: 91 100 96 91  Resp: '16 16 16 20  '$ Temp: 98.2 F (36.8 C) 98.6 F (37 C) 98.6 F (37 C) 98.8 F (37.1 C)  TempSrc:  Oral  Oral Oral  SpO2: 95% 96% 98% 92%  Weight:      Height:      General:  Alert, oriented, calm, in no acute distress, eating breakfast wearing 3L [place]  Eyes: EOMI, clear conjuctivae, white sclerea Neck: supple, no masses, trachea mildline  Cardiovascular: RRR, no murmurs or rubs, no peripheral edema  Respiratory: good bilateral air entry, diminished on R, diffuse rhonchi  Abdomen: soft, nontender, nondistended, normal bowel tones heard  Skin: dry, no rashes  Musculoskeletal: no joint effusions, normal range of motion  Psychiatric: appropriate affect, normal speech  Neurologic: extraocular muscles intact, clear speech, moving all extremities with intact sensorium  Disposition: Status is: Inpatient  Planned Discharge Destination: Likely resume home health.  Time spent: 35 minutes  Author: [place] Neva Seat, MD 05/31/2022 9:50 AM  For on call review [email or web address].

## 2022-06-01 ENCOUNTER — Inpatient Hospital Stay: Payer: Medicare PPO

## 2022-06-01 ENCOUNTER — Other Ambulatory Visit: Payer: Self-pay

## 2022-06-01 DIAGNOSIS — J9601 Acute respiratory failure with hypoxia: Secondary | ICD-10-CM

## 2022-06-01 DIAGNOSIS — M4135 Thoracogenic scoliosis, thoracolumbar region: Secondary | ICD-10-CM

## 2022-06-01 DIAGNOSIS — J90 Pleural effusion, not elsewhere classified: Secondary | ICD-10-CM | POA: Insufficient documentation

## 2022-06-01 DIAGNOSIS — I10 Essential (primary) hypertension: Secondary | ICD-10-CM

## 2022-06-01 DIAGNOSIS — I5032 Chronic diastolic (congestive) heart failure: Secondary | ICD-10-CM

## 2022-06-01 DIAGNOSIS — E43 Unspecified severe protein-calorie malnutrition: Secondary | ICD-10-CM

## 2022-06-01 DIAGNOSIS — G8929 Other chronic pain: Secondary | ICD-10-CM

## 2022-06-01 DIAGNOSIS — M5441 Lumbago with sciatica, right side: Secondary | ICD-10-CM

## 2022-06-01 DIAGNOSIS — J441 Chronic obstructive pulmonary disease with (acute) exacerbation: Secondary | ICD-10-CM

## 2022-06-01 DIAGNOSIS — J849 Interstitial pulmonary disease, unspecified: Secondary | ICD-10-CM

## 2022-06-01 DIAGNOSIS — F17200 Nicotine dependence, unspecified, uncomplicated: Secondary | ICD-10-CM

## 2022-06-01 DIAGNOSIS — Z8673 Personal history of transient ischemic attack (TIA), and cerebral infarction without residual deficits: Secondary | ICD-10-CM

## 2022-06-01 DIAGNOSIS — U071 COVID-19: Secondary | ICD-10-CM | POA: Diagnosis not present

## 2022-06-01 DIAGNOSIS — G629 Polyneuropathy, unspecified: Secondary | ICD-10-CM

## 2022-06-01 LAB — CBC
HCT: 24.6 % — ABNORMAL LOW (ref 39.0–52.0)
Hemoglobin: 7.8 g/dL — ABNORMAL LOW (ref 13.0–17.0)
MCH: 26.5 pg (ref 26.0–34.0)
MCHC: 31.7 g/dL (ref 30.0–36.0)
MCV: 83.7 fL (ref 80.0–100.0)
Platelets: 613 10*3/uL — ABNORMAL HIGH (ref 150–400)
RBC: 2.94 MIL/uL — ABNORMAL LOW (ref 4.22–5.81)
RDW: 16.9 % — ABNORMAL HIGH (ref 11.5–15.5)
WBC: 23.2 10*3/uL — ABNORMAL HIGH (ref 4.0–10.5)
nRBC: 0.1 % (ref 0.0–0.2)

## 2022-06-01 LAB — BASIC METABOLIC PANEL
Anion gap: 7 (ref 5–15)
BUN: 28 mg/dL — ABNORMAL HIGH (ref 8–23)
CO2: 30 mmol/L (ref 22–32)
Calcium: 7.8 mg/dL — ABNORMAL LOW (ref 8.9–10.3)
Chloride: 96 mmol/L — ABNORMAL LOW (ref 98–111)
Creatinine, Ser: 0.78 mg/dL (ref 0.61–1.24)
GFR, Estimated: >60 mL/min (ref >60)
Glucose, Bld: 99 mg/dL (ref 70–99)
Potassium: 3.9 mmol/L (ref 3.5–5.1)
Sodium: 133 mmol/L — ABNORMAL LOW (ref 135–145)

## 2022-06-01 LAB — JAK2 V617F RFX CALR/MPL/E12-15

## 2022-06-01 LAB — C-REACTIVE PROTEIN: CRP: 8.3 mg/dL — ABNORMAL HIGH (ref <1.0)

## 2022-06-01 LAB — CALR +MPL + E12-E15  (REFLEX)

## 2022-06-01 LAB — HEPATIC FUNCTION PANEL
ALT: 19 U/L (ref 0–44)
AST: 21 U/L (ref 15–41)
Albumin: 2.3 g/dL — ABNORMAL LOW (ref 3.5–5.0)
Alkaline Phosphatase: 63 U/L (ref 38–126)
Bilirubin, Direct: <0.1 mg/dL (ref 0.0–0.2)
Total Bilirubin: 0.6 mg/dL (ref 0.3–1.2)
Total Protein: 5.6 g/dL — ABNORMAL LOW (ref 6.5–8.1)

## 2022-06-01 LAB — D-DIMER, QUANTITATIVE: D-Dimer, Quant: 1.76 ug/mL-FEU — ABNORMAL HIGH (ref 0.00–0.50)

## 2022-06-01 LAB — LACTATE DEHYDROGENASE: LDH: 152 U/L (ref 98–192)

## 2022-06-01 LAB — PROCALCITONIN: Procalcitonin: 0.25 ng/mL

## 2022-06-01 MED ORDER — FUROSEMIDE 20 MG PO TABS
20.0000 mg | ORAL_TABLET | Freq: Every day | ORAL | Status: DC
  Administered 2022-06-01 – 2022-06-03 (×3): 20 mg via ORAL
  Filled 2022-06-01 (×3): qty 1

## 2022-06-01 MED ORDER — GABAPENTIN 400 MG PO CAPS: 400.0000 mg | ORAL_CAPSULE | Freq: Three times a day (TID) | ORAL | Status: DC | PRN

## 2022-06-01 MED ORDER — OXYCODONE HCL 5 MG PO TABS: 5.0000 mg | ORAL_TABLET | Freq: Three times a day (TID) | ORAL | Status: DC | PRN

## 2022-06-01 MED ORDER — ACETAMINOPHEN 325 MG PO TABS
650.0000 mg | ORAL_TABLET | Freq: Four times a day (QID) | ORAL | Status: DC
  Administered 2022-06-01 – 2022-06-03 (×8): 650 mg via ORAL
  Filled 2022-06-01 (×8): qty 2

## 2022-06-01 MED ORDER — OXYCODONE HCL 5 MG PO TABS
5.0000 mg | ORAL_TABLET | Freq: Four times a day (QID) | ORAL | Status: DC | PRN
  Administered 2022-06-01 – 2022-06-03 (×5): 5 mg via ORAL
  Filled 2022-06-01 (×5): qty 1

## 2022-06-01 MED ORDER — GABAPENTIN 300 MG PO CAPS
400.0000 mg | ORAL_CAPSULE | Freq: Every day | ORAL | Status: DC
  Administered 2022-06-01 – 2022-06-02 (×2): 400 mg via ORAL
  Filled 2022-06-01 (×2): qty 1

## 2022-06-01 NOTE — Progress Notes (Addendum)
Speech Language Pathology Treatment: Dysphagia  Patient Details Name: Lucas Ramirez MRN: [id number] DOB: 1955/06/18 Today's Date: 06/01/2022 Time: 6553-7482 SLP Time Calculation (min) (ACUTE ONLY): 30 min  Assessment / Plan / Recommendation Clinical Impression  Pt seen for dysphagia treatment today. Pt laying in bed w/ lights off and mitts on upon SLP arrival. Pt appeared alert and cooperative throughout eval. Required assistance to sit up in bed for meal. MD entered during tx to check his status. MD recommended discontinuing O2 via [place] and use of mitts. Pt left sitting up in bed w/ bed alarm set and call button in reach. Restraints and Lucas Ramirez O2 removed per MD request/ok.  Pt on 1L O2 via [place] (removed end of session); afebrile; WBC elevated.  Pt does not appear to present w/ any overt s/s of oropharyngeal dysphagia at this time. Reduced aspiration risk when using General aspiration precautions (eating SLOWLY, rest break b/t bites, small bites/sips, sitting upright).  W/ set up and mitt removed, pt self-fed solids (~ 8-10 bites) and thin liquids via cup and straw (~4-5 oz). During oral phase, pt exhibited good bolus control/management, adequately clear oral cavity post-po's, and timely A-P bolus transit. Noted mildly prolonged mastication and some Munching chewing patterns in setting of missing dentition. Noted instances of pt eating quickly (putting 3 bites in mouth before clearing oral cavity). SLP provided min tactile/verbal cues to eat slowly and take rest breaks b/t bites. Pt's oral phase remains grossly FUNCTIONAL for mastication/swallowing. Pt responded well to verbal cues for breaks b/t bites. During pharyngeal phase, pt exhibited seemingly timely pharyngeal swallow and clear vocal quality post-po's; no coughing nor decline in respiratory presentation noted during/post oral intake. Noted congested cough x1 several minutes after po's. Cough did not appear to be consistent/immediate w/ po's.  Pt  educated on General aspiration precautions (eating slowly, rest breaks b/t bites, small bites/sips, sitting upright). Pt demonstrated comprehension and participated in teach back of strategies. Pt updated on SLP POC and recommendations. Pt agreed.  Recommend continue regular diet (w/ meats cut) w/ thin liquids -- monitor straw use. Staff to assist w/ cutting foods for ease of chewing/eating. Recommend po's w/ General aspiration precautions (eating SLOWLY, rest break b/t bites, small bites/sips, sitting upright). RN updated and agreed.  No further acute skilled ST services needed at this time. MD to reconsult if any further needs arise during admission.       HPI HPI: Per H&P "Lucas Ramirez is a 67 y.o. male  who presents from home for SOB and hallucinations. Pt is on BIPAP and not talking at this time. Thus, the pt's wife was called to provide the history (Lucas Ramirez [phone]). Wife reports that since the pt was discharged from the hospital in Dec 2023 he has had home health visiting the house for physical therapy needs.  However, the PT personnel was apparently COVID + herself. The pt's wife reports that she attempted to bring the pt to the hospital 2 weeks ago but the pt himself refused an ambulance and refused a visit to the PCP).  Pt has had cough, fever and chills at home.     However, today the pt's vitals were irregular (HR 106 and BP 160/90) with a fever at home. Thus, today when the wife called the ambulance the pt did not refuse the ambulance. 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 He was confirmed COVID + in the ER  today 05/26/2022.      Spoke with the wife on the phone today regarding the goals of care given the pt's current presentation, BIPAP requirements, lack of mentation/situational awareness and co-morbidities. The wife clearly advised that Mr. [place] is a DNR/DNI and she would like him to remain a DNR/DNI for this admission. The wife was  clearly advised of the high possibility of decompensation and death for Mr. [place]. She understood that death was a possibility. Pt will be admitted to the medicine service for further management." Head CT, 05/26/22, "1. 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  2. Previously seen subacute to chronic infarction of the posterior  right upper lobe." CXR, 05/26/22, "Small right-greater-than-left pleural effusions and bibasilar  atelectasis."      SLP Plan  All goals met      Recommendations for follow up therapy are one component of a multi-disciplinary discharge planning process, led by the attending physician.  Recommendations may be updated based on patient status, additional functional criteria and insurance authorization.    Recommendations  Diet recommendations: Regular;Thin liquid Liquids provided via: Straw;Cup (Monitor straw use) Medication Administration: Whole meds with liquid Supervision: Patient able to self feed;Staff to assist with self feeding Compensations: Minimize environmental distractions;Slow rate;Small sips/bites Postural Changes and/or Swallow Maneuvers: Seated upright 90 degrees;Upright 30-60 min after meal                General recommendations:  (Dietician consult) Oral Care Recommendations: Patient independent with oral care;Oral care before and after PO;Oral care QID Follow Up Recommendations: Skilled nursing-short term rehab (<3 hours/day) Assistance recommended at discharge: Intermittent Supervision/Assistance SLP Visit Diagnosis: Dysphagia, unspecified (R13.10) Plan: All goals met          [place], Speech Pathology  Randall Hiss 06/01/2022, 11:42 AM    The information in this patient note, response to treatment, and overall treatment plan developed has been reviewed and agreed upon after reviewing documentation. This session was performed  under the supervision of this licensed clinician.   Orinda Kenner, [place], [place] [phone] 06/01/2022; 5:22pm

## 2022-06-01 NOTE — Progress Notes (Signed)
PROGRESS NOTE  Lucas Ramirez MPN:[id number] DOB: 02-Dec-1955   PCP: Birdie Sons, MD  Patient is from: Home  DOA: 05/26/2022 LOS: 6  Chief complaints No chief complaint on file.    Brief Narrative / Interim history: 13M with hx of CVA, GERD, depression, HTN, cachexia of unclear etiology who is DNR and admitted with hypoxic respiratory failure due to COVID. He is on room air at baseline, and remains on 3L.    Today 2/6 CXR reveals large R pleural effusion.    Subjective: Seen and examined earlier this morning.  Eating his breakfast in bed.  No complaints but not a great historian.  He is oriented to self, person and situation.  Follows some commands.  Not very conversant.  Objective: Vitals:   06/01/22 0502 06/01/22 0600 06/01/22 0826 06/01/22 1251  BP:   (!) 133/100 114/64  Pulse:   94 (!) 105  Resp: '20 19 16   '$ Temp:   98 F (36.7 C)   TempSrc:      SpO2:   96% 94%  Weight:      Height:        Examination:  GENERAL: Appears frail.  No apparent distress. HEENT: MMM.  Vision and hearing grossly intact.  NECK: Supple.  No apparent JVD.  RESP:  No IWOB.  Diminished aeration bilaterally. CVS:  RRR. Heart sounds normal.  ABD/GI/GU: BS+. Abd soft, NTND.  MSK/EXT:  Moves extremities.  Significant thoracic scoliosis.  Significant muscle mass and subcu fat loss. SKIN: no apparent skin lesion or wound NEURO: Awake, alert and oriented to self and place.  Follows some commands.  No apparent focal neuro deficit. PSYCH: Calm. Normal affect.   Procedures:  2/7-ultrasound-guided thoracocentesis  Microbiology summarized: 2/1-COVID-19 PCR positive. 2/1-influenza and RSV PCR nonreactive. 2/1-MRSA PCR nonreactive 2/1-blood cultures NGTD  Assessment and plan: Principal Problem:   COVID-19 Active Problems:   COPD exacerbation (HCC)   Back pain, chronic   Scoliosis   Acute respiratory failure with hypoxia (HCC)   Polyneuropathy   History of CVA (cerebrovascular  accident)   Tobacco dependence   Hypertension, essential   Protein-calorie malnutrition, severe   Chronic diastolic CHF (congestive heart failure) (HCC)   Interstitial lung disease (HCC)   Recurrent right pleural effusion  Acute respiratory failure with hypoxia: Multifactorial including COVID-19 infection, bacterial infection, pleural effusion, scoliosis and underlying ILD.  COVID-19 PCR positive on 2/1.  Weaned to room air this morning. -Treat treatable causes -Encourage incentive spirometry as able  COVID-19 infection: -Completed 5 days of remdesivir. -Remains on Decadron daily. -Check inflammatory markers.  Bacterial pneumonia: -Completed 5 days of antibiotics with IV Zosyn on 2/5. -Check procalcitonin. -Pulmonary toilet.  Large right pleural effusion: Parapneumonic? -Plan for thoracocentesis today. -Follow fluid studies.  MCA territory CVA: CT head showed new acute to subacute infarction in the anterior MCA territory. -Continue aspirin, Plavix and Crestor. -Continue PT/OT  Acute metabolic encephalopathy, resolved: Awake and oriented to self, place and situation.   -Reorientation and delirium precautions. -Treat treatable causes.  Essential hypertension - Lisinopril 20 mg PO daily    Chronic pain/thoracic scoliosis: Was on gabapentin 800 mg 3 times daily as needed but has been getting only once a day on average. -Schedule gabapentin at 400 mg daily nightly. -Schedule Tylenol. -As needed oxycodone  Leukocytosis/bandemia: Due to steroid? -Continue monitoring.   Severe malnutrition Body mass index is 13.74 kg/m. Nutrition Problem: Severe Malnutrition Etiology: chronic illness (CVA) Signs/Symptoms: percent weight loss, severe fat depletion, severe  muscle depletion Interventions: Ensure Enlive (each supplement provides 350kcal and 20 grams of protein), MVI, Magic cup  Pressure skin injury: Pressure Injury 04/09/22 Coccyx Stage 2 -  Partial thickness loss of dermis  presenting as a shallow open injury with a red, pink wound bed without slough. mostly closed red area but two small openings still (Active)  04/09/22 2015  Location: Coccyx  Location Orientation:   Staging: Stage 2 -  Partial thickness loss of dermis presenting as a shallow open injury with a red, pink wound bed without slough.  Wound Description (Comments): mostly closed red area but two small openings still  Present on Admission: Yes  Dressing Type Foam - Lift dressing to assess site every shift 05/31/22 2053   DVT prophylaxis:  enoxaparin (LOVENOX) injection 30 mg Start: 05/27/22 2200  Code Status: DNR/DNI Family Communication: None at bedside Level of care: Med-Surg Status is: Inpatient Remains inpatient appropriate because: Acute respiratory failure with hypoxia, COVID-19 infection and pleural effusion   Final disposition: Home with home health and DME on this charge Consultants:  IR  55 minutes with more than 50% spent in reviewing records, counseling patient/family and coordinating care.   Sch Meds:  Scheduled Meds:  acetaminophen  650 mg Oral Q6H WA   vitamin C  500 mg Oral BID   aspirin EC  81 mg Oral Daily   Chlorhexidine Gluconate Cloth  6 each Topical Daily   clopidogrel  75 mg Oral Daily   dexamethasone (DECADRON) injection  6 mg Intravenous Q1400   enoxaparin (LOVENOX) injection  30 mg Subcutaneous Q24H   feeding supplement  237 mL Oral TID BM   guaiFENesin  600 mg Oral BID   Ipratropium-Albuterol  2 puff Inhalation Q6H   latanoprost  1 drop Both Eyes QHS   lisinopril  20 mg Oral Daily   mirtazapine  15 mg Oral QHS   multivitamin with minerals  1 tablet Oral Daily   rosuvastatin  20 mg Oral Daily   zinc sulfate  220 mg Oral Daily   Continuous Infusions:  sodium chloride 10 mL/hr at 05/28/22 1830   PRN Meds:.sodium chloride, gabapentin, hydrALAZINE, oxyCODONE  Antimicrobials: Anti-infectives (From admission, onward)    Start     Dose/Rate Route  Frequency Ordered Stop   05/28/22 1000  remdesivir 100 mg in sodium chloride 0.9 % 100 mL IVPB       See Hyperspace for full Linked Orders Report.   100 mg 200 mL/hr over 30 Minutes Intravenous Daily 05/28/22 0940 05/30/22 1242   05/27/22 1000  remdesivir 100 mg in sodium chloride 0.9 % 100 mL IVPB  Status:  Discontinued       See Hyperspace for full Linked Orders Report.   100 mg 200 mL/hr over 30 Minutes Intravenous Daily 05/26/22 1543 05/28/22 0940   05/26/22 2200  piperacillin-tazobactam (ZOSYN) IVPB 3.375 g        3.375 g 12.5 mL/hr over 240 Minutes Intravenous Every 8 hours 05/26/22 1551 05/30/22 2130   05/26/22 1545  remdesivir 200 mg in sodium chloride 0.9% 250 mL IVPB       See Hyperspace for full Linked Orders Report.   200 mg 580 mL/hr over 30 Minutes Intravenous Once 05/26/22 1543 05/26/22 1801   05/26/22 1500  vancomycin (VANCOREADY) IVPB 1250 mg/250 mL        1,250 mg 166.7 mL/hr over 90 Minutes Intravenous  Once 05/26/22 1402 05/26/22 1646   05/26/22 1345  piperacillin-tazobactam (ZOSYN) IVPB 3.375 g  3.375 g 100 mL/hr over 30 Minutes Intravenous  Once 05/26/22 1333 05/26/22 1431   05/26/22 1345  azithromycin (ZITHROMAX) 500 mg in sodium chloride 0.9 % 250 mL IVPB        500 mg 250 mL/hr over 60 Minutes Intravenous  Once 05/26/22 1333 05/26/22 1528        I have personally reviewed the following labs and images: CBC: Recent Labs  Lab 05/26/22 1328 05/27/22 0230 05/28/22 0336 05/29/22 0713 05/30/22 0620 05/31/22 0648 06/01/22 0519  WBC 12.6*   < > 9.9 9.3 12.6* 19.0* 23.2*  NEUTROABS 10.5*  --   --   --   --   --   --   HGB 9.2*   < > 8.0* 8.2* 8.2* 8.5* 7.8*  HCT 30.6*   < > 25.3* 26.2* 26.3* 26.9* 24.6*  MCV 89.2   < > 85.5 85.3 84.3 84.3 83.7  PLT 412*   < > 438* 521* 532* 618* 613*   < > = values in this interval not displayed.   BMP &GFR Recent Labs  Lab 05/27/22 0230 05/28/22 0336 05/29/22 0713 05/30/22 0620 05/31/22 0648  06/01/22 0519  NA 138 137 137 135 136 133*  K 4.4 4.1 4.3 4.3 4.6 3.9  CL 105 102 101 98 96* 96*  CO2 '30 28 31 29 '$ 32 30  GLUCOSE 158* 87 80 97 70 99  BUN 17 19 24* 28* 26* 28*  CREATININE 0.75 0.83 0.90 0.85 0.85 0.78  CALCIUM 6.9* 7.4* 7.7* 7.9* 8.1* 7.8*  MG 1.9 1.9 2.1  --   --   --   PHOS 3.1 2.8 3.7  --   --   --    Estimated Creatinine Clearance: 46.6 mL/min (by C-G formula based on SCr of 0.78 mg/dL). Liver & Pancreas: Recent Labs  Lab 05/26/22 1328 05/27/22 0230 05/28/22 0336 05/29/22 0713 06/01/22 0831  AST 29 26 33 20 21  ALT '26 24 29 24 19  '$ ALKPHOS 70 67 60 62 63  BILITOT 0.5 0.5 0.6 0.5 0.6  PROT 6.4* 5.7* 5.5* 5.4* 5.6*  ALBUMIN 2.6* 2.3* 2.3* 2.3* 2.3*   No results for input(s): "LIPASE", "AMYLASE" in the last 168 hours. No results for input(s): "AMMONIA" in the last 168 hours. Diabetic: No results for input(s): "HGBA1C" in the last 72 hours. No results for input(s): "GLUCAP" in the last 168 hours. Cardiac Enzymes: No results for input(s): "CKTOTAL", "CKMB", "CKMBINDEX", "TROPONINI" in the last 168 hours. No results for input(s): "PROBNP" in the last 8760 hours. Coagulation Profile: Recent Labs  Lab 05/26/22 1328  INR 1.1   Thyroid Function Tests: No results for input(s): "TSH", "T4TOTAL", "FREET4", "T3FREE", "THYROIDAB" in the last 72 hours. Lipid Profile: No results for input(s): "CHOL", "HDL", "LDLCALC", "TRIG", "CHOLHDL", "LDLDIRECT" in the last 72 hours. Anemia Panel: No results for input(s): "VITAMINB12", "FOLATE", "FERRITIN", "TIBC", "IRON", "RETICCTPCT" in the last 72 hours. Urine analysis:    Component Value Date/Time   COLORURINE YELLOW (A) 05/30/2022 0015   APPEARANCEUR HAZY (A) 05/30/2022 0015   APPEARANCEUR Clear 02/01/2014 1824   LABSPEC 1.021 05/30/2022 0015   LABSPEC 1.006 02/01/2014 1824   PHURINE 5.0 05/30/2022 0015   GLUCOSEU NEGATIVE 05/30/2022 0015   GLUCOSEU Negative 02/01/2014 1824   HGBUR NEGATIVE 05/30/2022 0015    BILIRUBINUR NEGATIVE 05/30/2022 0015   BILIRUBINUR Negative 02/01/2014 1824   KETONESUR NEGATIVE 05/30/2022 0015   PROTEINUR NEGATIVE 05/30/2022 0015   NITRITE NEGATIVE 05/30/2022 0015   LEUKOCYTESUR NEGATIVE 05/30/2022  0015   LEUKOCYTESUR Negative 02/01/2014 1824   Sepsis Labs: Invalid input(s): "PROCALCITONIN", "LACTICIDVEN"  Microbiology: Recent Results (from the past 240 hour(s))  Blood Culture (routine x 2)     Status: None   Collection Time: 05/26/22  1:28 PM   Specimen: BLOOD  Result Value Ref Range Status   Specimen Description BLOOD BLOOD LEFT ARM  Final   Special Requests   Final    BOTTLES DRAWN AEROBIC AND ANAEROBIC Blood Culture adequate volume   Culture   Final    NO GROWTH 5 DAYS Performed at [facility], [address] [place], [place] [address]    Report Status 05/31/2022 FINAL  Final  Blood Culture (routine x 2)     Status: None   Collection Time: 05/26/22  1:28 PM   Specimen: BLOOD  Result Value Ref Range Status   Specimen Description BLOOD BLOOD RIGHT ARM  Final   Special Requests   Final    BOTTLES DRAWN AEROBIC AND ANAEROBIC Blood Culture adequate volume   Culture   Final    NO GROWTH 5 DAYS Performed at [facility], [address] [place], [place] [address]    Report Status 05/31/2022 FINAL  Final  Resp panel by RT-PCR (RSV, Flu A&B, Covid) Anterior Nasal Swab     Status: Abnormal   Collection Time: 05/26/22  1:28 PM   Specimen: Anterior Nasal Swab  Result Value Ref Range Status   SARS Coronavirus 2 by RT PCR POSITIVE (A) NEGATIVE Final    Comment: (NOTE) SARS-CoV-2 target nucleic acids are DETECTED.  The SARS-CoV-2 RNA is generally detectable in upper respiratory specimens during the acute phase of infection. Positive results are indicative of the presence of the identified virus, but do not rule out bacterial infection or co-infection with other pathogens not detected by the test. Clinical correlation with patient  history and other diagnostic information is necessary to determine patient infection status. The expected result is Negative.  Fact Sheet for Patients: EntrepreneurPulse.com.au  Fact Sheet for Healthcare Providers: IncredibleEmployment.be  This test is not yet approved or cleared by the [place] FDA and  has been authorized for detection and/or diagnosis of SARS-CoV-2 by FDA under an Emergency Use Authorization (EUA).  This EUA will remain in effect (meaning this test can be used) for the duration of  the COVID-19 declaration under Section 564(b)(1) of the A ct, 21 U.S.C. section 360bbb-3(b)(1), unless the authorization is terminated or revoked sooner.     Influenza A by PCR NEGATIVE NEGATIVE Final   Influenza B by PCR NEGATIVE NEGATIVE Final    Comment: (NOTE) The Xpert Xpress SARS-CoV-2/FLU/RSV plus assay is intended as an aid in the diagnosis of influenza from Nasopharyngeal swab specimens and should not be used as a sole basis for treatment. Nasal washings and aspirates are unacceptable for Xpert Xpress SARS-CoV-2/FLU/RSV testing.  Fact Sheet for Patients: EntrepreneurPulse.com.au  Fact Sheet for Healthcare Providers: IncredibleEmployment.be  This test is not yet approved or cleared by the [place] FDA and has been authorized for detection and/or diagnosis of SARS-CoV-2 by FDA under an Emergency Use Authorization (EUA). This EUA will remain in effect (meaning this test can be used) for the duration of the COVID-19 declaration under Section 564(b)(1) of the Act, 21 U.S.C. section 360bbb-3(b)(1), unless the authorization is terminated or revoked.     Resp Syncytial Virus by PCR NEGATIVE NEGATIVE Final    Comment: (NOTE) Fact Sheet for Patients: EntrepreneurPulse.com.au  Fact Sheet for Healthcare Providers:  IncredibleEmployment.be  This test is not yet  approved or cleared by the [place] and has been authorized for detection and/or diagnosis of SARS-CoV-2 by FDA under an Emergency Use Authorization (EUA). This EUA will remain in effect (meaning this test can be used) for the duration of the COVID-19 declaration under Section 564(b)(1) of the Act, 21 U.S.C. section 360bbb-3(b)(1), unless the authorization is terminated or revoked.  Performed at [facility], [address] [place], [place] [address]   MRSA Next Gen by PCR, Nasal     Status: None   Collection Time: 05/26/22  5:15 PM   Specimen: Nasal Mucosa; Nasal Swab  Result Value Ref Range Status   MRSA by PCR Next Gen NOT DETECTED NOT DETECTED Final    Comment: (NOTE) The GeneXpert MRSA Assay (FDA approved for NASAL specimens only), is one component of a comprehensive MRSA colonization surveillance program. It is not intended to diagnose MRSA infection nor to guide or monitor treatment for MRSA infections. Test performance is not FDA approved in patients less than 67 years old. Performed at [facility], [address], [place], [place] [address]     Radiology Studies: No results found.    Stevon Gough T. Maltby  If 7PM-7AM, please contact night-coverage [email or web address] 06/01/2022, 3:05 PM

## 2022-06-01 NOTE — Progress Notes (Signed)
Nutrition Follow-up  DOCUMENTATION CODES:   Underweight, Severe malnutrition in context of chronic illness  INTERVENTION:   -Continue MVI with minerals daily -Continue 500 mg vitamin C BID -Continue 220 mg zinc sulfate daily -Continue Ensure Enlive po TID, each supplement provides 350 kcal and 20 grams of protein.  -Continue Magic cup TID with meals, each supplement provides 290 kcal and 9 grams of protein   NUTRITION DIAGNOSIS:   Severe Malnutrition related to chronic illness (CVA) as evidenced by percent weight loss, severe fat depletion, severe muscle depletion.  Ongoing  GOAL:   Patient will meet greater than or equal to 90% of their needs  Progressing   MONITOR:   PO intake, Supplement acceptance  REASON FOR ASSESSMENT:   Malnutrition Screening Tool    ASSESSMENT:   Pt admitted for shortness of breath and hallucinations.  2/4- s/p BSE- regular diet with thin liquids  Reviewed I/O's: -282 ml x 24 hours and -2.6 L since admission  UOP: 900 ml x 24 hours   Pt sitting up in bed at time of visit. Pt is more upbeat and interactive with this RD in comparison to prior visit. Pt states "I'm doing a lot better, but just don't know where I am". RD reoriented pt to place and situation.   Per pt, he shares that his appetite is fair. He estimates he consumed about 50% of his french toast this morning. Pt also consumed about 75% of Ensure. Pt drinking them well and prefers the strawberry flavor. Noted meal completions 25-75%.   Case discussed with RN, who confirmed above findings.   Per TOC notes, plan to d/c home with hospice once medically stable.   Medications reviewed and include vitamin C, decadron, remeron, and zinc.   Labs reviewed: Na: 133, CBGS: 149 (inpatient orders for glycemic control are none).    Diet Order:   Diet Order             Diet regular Room service appropriate? Yes; Fluid consistency: Thin  Diet effective now                    EDUCATION NEEDS:   Education needs have been addressed  Skin:  Skin Assessment: Skin Integrity Issues: Skin Integrity Issues:: Stage II Stage II: coccyx  Last BM:  05/30/22  Height:   Ht Readings from Last 1 Encounters:  05/26/22 '5\' 4"'$  (1.626 m)    Weight:   Wt Readings from Last 1 Encounters:  05/26/22 36.3 kg    Ideal Body Weight:  59.1 kg  BMI:  Body mass index is 13.74 kg/m.  Estimated Nutritional Needs:   Kcal:  1450-1650  Protein:  75-90 grams  Fluid:  > 1.4 L    Loistine Chance, RD, LDN, [place] Registered Dietitian II Certified Diabetes Care and Education Specialist Please refer to [facility] for RD and/or RD on-call/weekend/after hours pager

## 2022-06-01 NOTE — Progress Notes (Signed)
PT Cancellation Note  Patient Details Name: Lucas Ramirez MRN: [id number] DOB: 1956-02-11   Cancelled Treatment:    Reason Eval/Treat Not Completed: Patient at procedure or test/unavailable Chart reviewed, attempted to see pt but he was out of room for [place].  Apparently will return to different room on 1C.  Not on floor at this time, will maintain on caseload and attempt to see as pt available and appropriate.      Kreg Shropshire, DPT 06/01/2022, 3:48 PM

## 2022-06-01 NOTE — Progress Notes (Signed)
Occupational Therapy Treatment Patient Details Name: Lucas Ramirez MRN: [id number] DOB: 01/13/1956 Today's Date: 06/01/2022   History of present illness Pt is a 67 y/o M admitted on 05/26/22 after presenting with c/o SOB & hallucinations. In the ED pt was placed on BiPAP 2/2 work of breathing & was confirmed Covid+. Pt is being treated for acute hypoxic respiratory failure 2/2 Covid 19 virus, PNA, & acute metabolic encephalopathy. 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 PMH: Bell's palsy, CVA, depression, GERD, Glaucoma, HTN, migraines, scoliosis, shingles   OT comments  Upon entering the room, pt seated in recliner chair and attempting to lay down in chair and reporting wanting to lay down. Pt needing increased cuing for safety awareness. Pt needing max A to stand pivot from recliner chair to bed and max A for trunk support and B LEs to return to supine and for repositioning in bed for comfort. Pt resting with all needs within reach.    Recommendations for follow up therapy are one component of a multi-disciplinary discharge planning process, led by the attending physician.  Recommendations may be updated based on patient status, additional functional criteria and insurance authorization.    Follow Up Recommendations  Home health OT     Assistance Recommended at Discharge Frequent or constant Supervision/Assistance  Patient can return home with the following  A lot of help with bathing/dressing/bathroom;Direct supervision/assist for financial management;Direct supervision/assist for medications management;Assistance with cooking/housework;Assist for transportation;Help with stairs or ramp for entrance;A lot of help with walking and/or transfers   Equipment Recommendations  Hospital bed;Other (comment) (hoyer lift and hoyer pad)       Precautions / Restrictions Precautions Precautions: Fall Precaution Comments: R hemi       Mobility Bed Mobility Overal bed  mobility: Needs Assistance Bed Mobility: Sit to Supine       Sit to supine: Max assist        Transfers Overall transfer level: Needs assistance Equipment used: 1 person hand held assist Transfers: Bed to chair/wheelchair/BSC   Stand pivot transfers: Max assist               Balance Overall balance assessment: Needs assistance Sitting-balance support: Feet supported, Bilateral upper extremity supported Sitting balance-Leahy Scale: Fair     Standing balance support: Bilateral upper extremity supported Standing balance-Leahy Scale: Zero                             ADL either performed or assessed with clinical judgement      Cognition Arousal/Alertness: Awake/alert Behavior During Therapy: Flat affect Overall Cognitive Status: History of cognitive impairments - at baseline                                 General Comments: Pt with increased time to process and decreased safety awareness. Pt is pleasant and follows commands with increased time.                   Pertinent Vitals/ Pain       Pain Assessment Pain Assessment: No/denies pain         Frequency  Min 2X/week        Progress Toward Goals  OT Goals(current goals can now be found in the care plan section)  Progress towards OT goals: Progressing toward goals  Acute Rehab OT Goals Patient  Stated Goal: to go home OT Goal Formulation: With patient Time For Goal Achievement: 06/12/22 Potential to Achieve Goals: [place] Discharge plan remains appropriate;Frequency remains appropriate       AM-PAC OT "6 Clicks" Daily Activity     Outcome Measure   Help from another person eating meals?: A Little Help from another person taking care of personal grooming?: A Little Help from another person toileting, which includes using toliet, bedpan, or urinal?: A Lot Help from another person bathing (including washing, rinsing, drying)?: A Lot Help from another person to  put on and taking off regular upper body clothing?: A Lot Help from another person to put on and taking off regular lower body clothing?: A Lot 6 Click Score: 14    End of Session Equipment Utilized During Treatment: Oxygen  OT Visit Diagnosis: Other abnormalities of gait and mobility (R26.89);Muscle weakness (generalized) (M62.81);History of falling (Z91.81);Other symptoms and signs involving the nervous system (R29.898)   Activity Tolerance Patient limited by fatigue   Patient Left in bed;with call bell/phone within reach;with bed alarm set   Nurse Communication Mobility status        Time: 9450-3888 OT Time Calculation (min): 14 min  Charges: OT General Charges $OT Visit: 1 Visit OT Treatments $Self Care/Home Management : 8-22 mins  Darleen Crocker, MS, OTR/L , CBIS ascom [phone]  06/01/22, 3:24 PM

## 2022-06-01 NOTE — Progress Notes (Signed)
Pt was brought to [place] for diagnostic thoracentesis. Upon US examination, there was no fluid on L and a very small fluid pocket on R with no safe window of access.  Exam was ended and explained to patient.  Patient was in agreement with findings.  Care team made aware of findings.     Narda Rutherford, AGNP-BC 06/01/2022, 3:16 PM

## 2022-06-02 DIAGNOSIS — J441 Chronic obstructive pulmonary disease with (acute) exacerbation: Secondary | ICD-10-CM | POA: Diagnosis not present

## 2022-06-02 DIAGNOSIS — U071 COVID-19: Secondary | ICD-10-CM | POA: Diagnosis not present

## 2022-06-02 DIAGNOSIS — D509 Iron deficiency anemia, unspecified: Secondary | ICD-10-CM

## 2022-06-02 DIAGNOSIS — M4135 Thoracogenic scoliosis, thoracolumbar region: Secondary | ICD-10-CM | POA: Diagnosis not present

## 2022-06-02 LAB — RENAL FUNCTION PANEL
Albumin: 2.4 g/dL — ABNORMAL LOW (ref 3.5–5.0)
Anion gap: 6 (ref 5–15)
BUN: 29 mg/dL — ABNORMAL HIGH (ref 8–23)
CO2: 32 mmol/L (ref 22–32)
Calcium: 8.1 mg/dL — ABNORMAL LOW (ref 8.9–10.3)
Chloride: 97 mmol/L — ABNORMAL LOW (ref 98–111)
Creatinine, Ser: 0.71 mg/dL (ref 0.61–1.24)
GFR, Estimated: >60 mL/min (ref >60)
Glucose, Bld: 117 mg/dL — ABNORMAL HIGH (ref 70–99)
Phosphorus: 4.2 mg/dL (ref 2.5–4.6)
Potassium: 4.1 mmol/L (ref 3.5–5.1)
Sodium: 135 mmol/L (ref 135–145)

## 2022-06-02 LAB — CBC WITH DIFFERENTIAL/PLATELET
Abs Immature Granulocytes: 0.09 10*3/uL — ABNORMAL HIGH (ref 0.00–0.07)
Basophils Absolute: 0 10*3/uL (ref 0.0–0.1)
Basophils Relative: 0 %
Eosinophils Absolute: 0 10*3/uL (ref 0.0–0.5)
Eosinophils Relative: 0 %
HCT: 24.5 % — ABNORMAL LOW (ref 39.0–52.0)
Hemoglobin: 7.7 g/dL — ABNORMAL LOW (ref 13.0–17.0)
Immature Granulocytes: 1 %
Lymphocytes Relative: 4 %
Lymphs Abs: 0.7 10*3/uL (ref 0.7–4.0)
MCH: 26.5 pg (ref 26.0–34.0)
MCHC: 31.4 g/dL (ref 30.0–36.0)
MCV: 84.2 fL (ref 80.0–100.0)
Monocytes Absolute: 0.8 10*3/uL (ref 0.1–1.0)
Monocytes Relative: 5 %
Neutro Abs: 14.3 10*3/uL — ABNORMAL HIGH (ref 1.7–7.7)
Neutrophils Relative %: 90 %
Platelets: 636 10*3/uL — ABNORMAL HIGH (ref 150–400)
RBC: 2.91 MIL/uL — ABNORMAL LOW (ref 4.22–5.81)
RDW: 17.2 % — ABNORMAL HIGH (ref 11.5–15.5)
WBC: 15.9 10*3/uL — ABNORMAL HIGH (ref 4.0–10.5)
nRBC: 0 % (ref 0.0–0.2)

## 2022-06-02 LAB — PROCALCITONIN: Procalcitonin: 0.27 ng/mL

## 2022-06-02 LAB — C-REACTIVE PROTEIN: CRP: 7.9 mg/dL — ABNORMAL HIGH (ref <1.0)

## 2022-06-02 LAB — BRAIN NATRIURETIC PEPTIDE: B Natriuretic Peptide: 85.3 pg/mL (ref 0.0–100.0)

## 2022-06-02 LAB — MAGNESIUM: Magnesium: 2.3 mg/dL (ref 1.7–2.4)

## 2022-06-02 MED ORDER — SODIUM CHLORIDE 0.9 % IV SOLN: 25.0000 mg | Freq: Once | INTRAVENOUS | Status: DC

## 2022-06-02 MED ORDER — SODIUM CHLORIDE 0.9 % IV SOLN: 500.0000 mg | Freq: Once | INTRAVENOUS | Status: DC

## 2022-06-02 MED ORDER — SODIUM CHLORIDE 0.9 % IV SOLN
300.0000 mg | Freq: Once | INTRAVENOUS | Status: AC
  Administered 2022-06-02: 300 mg via INTRAVENOUS
  Filled 2022-06-02: qty 15

## 2022-06-02 NOTE — TOC Progression Note (Signed)
Transition of Care [facility]) - Progression Note    Patient Details  Name: Lucas Ramirez MRN: [id number] Date of Birth: 1956/01/30  Transition of Care [facility]) CM/SW Contact  Gerilyn Pilgrim, LCSW Phone Number: 06/02/2022, 1:44 PM  Clinical Narrative:   CSW spoke with Wells Guiles from [facility] who states she plans to admit patient tomorrow and will have hospital bed and oxygen delivered for admission tomorrow.     Expected Discharge Plan: Home w Hospice Care Barriers to Discharge: Continued Medical Work up  Expected Discharge Plan and Services   Discharge Planning Services: CM Consult Post Acute Care Choice: Hospice Living arrangements for the past 2 months: Single Family Home                           HH Arranged: RN, PT [facility] Agency: [place] Date [place]: 05/27/22 Time [place]: [place] Representative spoke with at [place]: [place] ([place]) Interventions SDOH Screenings   Food Insecurity: No Food Insecurity (05/24/2022)  Housing: Low Risk  (05/24/2022)  Transportation Needs: No Transportation Needs (05/24/2022)  Utilities: Not At Risk (01/24/2022)  Alcohol Screen: Low Risk  (05/06/2022)  Depression (PHQ2-9): Low Risk  (05/24/2022)  Recent Concern: Depression (PHQ2-9) - Medium Risk (05/06/2022)  Financial Resource Strain: Low Risk  (01/24/2022)  Physical Activity: Inactive (01/24/2022)  Social Connections: Moderately Isolated (01/24/2022)  Stress: No Stress Concern Present (01/24/2022)  Tobacco Use: High Risk (05/24/2022)    Readmission Risk Interventions     No data to display

## 2022-06-02 NOTE — Progress Notes (Signed)
Provided humidification to pt's oxygen  Lucas Ramirez

## 2022-06-02 NOTE — Care Management Important Message (Signed)
Important Message  Patient Details  Name: Lucas Ramirez MRN: [id number] Date of Birth: 05/01/1955   Medicare Important Message Given:  Other (see comment)  Disposition to discharge with hospice services.  Medicare IM withheld at this time out of respect for patient and family.    Dannette Barbara 06/02/2022, 2:41 PM

## 2022-06-02 NOTE — Progress Notes (Signed)
Entered the patient's room to find dried blood on pt's mouth and chin, and in the nares. The patient was asked to stick out his tongue which had blood on it. Nurse pulled down the blanket and uncovered patient's hands which had dried blood on the patient's hands. The patient was asked again, "what happened? The patient stated his nose was bleeding. The NT and the nurse cleaned the pts face, nares, and hands, and swabbed the patient's mouth and tongue. The nurse assessed the patient and there's no active bleed. Mervin Hack

## 2022-06-02 NOTE — Progress Notes (Addendum)
PROGRESS NOTE  Lucas Ramirez XHB:[id number] DOB: 1956/01/11   PCP: Birdie Sons, MD  Patient is from: Home.  Lives with his wife and stepson's.  DOA: 05/26/2022 LOS: 7  Chief complaints No chief complaint on file.    Brief Narrative / Interim history: 61M with hx of CVA, GERD, depression, HTN, cachexia of unclear etiology who is DNR and admitted with hypoxic respiratory failure due to COVID, possible bacterial pneumonia.  Required up to 3 L by nasal cannula.  Patient completed 5 days of IV Zosyn on 2/5.also completed 5 days of remdesivir.  He is on Decadron 6 mg daily.  Due to ongoing oxygen requirement and respiratory distress, chest x-ray obtained on 2/6 and showed large right pleural effusion.  IR consulted and did not feel there is enough fluid to aspirate safely.  Patient's oxygen requirement improved.  Able to wean to room air.  Patient to discharge home with home hospice on 2/9.    Subjective: Seen and examined earlier this morning.  Had an episode of epistaxis overnight.  Bleeding seems to have resolved.  H&H stable.  Reports some shortness of breath.   Objective: Vitals:   06/01/22 1638 06/01/22 2055 06/02/22 0438 06/02/22 0814  BP: (!) 121/57 132/63 132/70 134/65  Pulse: 100 (!) 110 98 97  Resp: '19 19 19 16  '$ Temp: 98.9 F (37.2 C) 99.2 F (37.3 C) 98.6 F (37 C) 98.1 F (36.7 C)  TempSrc:      SpO2: 100% 96% 100% 98%  Weight:      Height:        Examination:  GENERAL: No apparent distress.  Nontoxic. HEENT: MMM.  Vision and hearing grossly intact.  NECK: Supple.  No apparent JVD.  RESP:  No IWOB.  Diminished aeration bilaterally.  Scoliosis. CVS:  RRR. Heart sounds normal.  ABD/GI/GU: BS+. Abd soft, NTND.  MSK/EXT:   Significant thoracic scoliosis.  Significant muscle mass and subcu fat loss. SKIN: no apparent skin lesion or wound NEURO: Awake and alert. Oriented fairly.  Follows commands.  No apparent focal neuro deficit. PSYCH: Calm. Normal affect.    Procedures:  2/7-ultrasound-guided thoracocentesis  Microbiology summarized: 2/1-COVID-19 PCR positive. 2/1-influenza and RSV PCR nonreactive. 2/1-MRSA PCR nonreactive 2/1-blood cultures NGTD  Assessment and plan: Principal Problem:   COVID-19 Active Problems:   COPD exacerbation (HCC)   Back pain, chronic   Scoliosis   Acute respiratory failure with hypoxia (HCC)   Polyneuropathy   History of CVA (cerebrovascular accident)   Tobacco dependence   Hypertension, essential   Protein-calorie malnutrition, severe   Chronic diastolic CHF (congestive heart failure) (HCC)   Interstitial lung disease (HCC)   Recurrent right pleural effusion  Acute respiratory failure with hypoxia: Multifactorial including COVID-19 infection, bacterial infection, pleural effusion, scoliosis and underlying ILD.  COVID-19 PCR positive on 2/1.  Weaned to room air.  -Treat treatable causes -Encourage incentive spirometry as able  COVID-19 infection: Respiratory failure seems to have resolved. -Completed 5 days of remdesivir. -Remains on Decadron daily.  Bacterial pneumonia: Completed 5 days of antibiotics with IV Zosyn on 2/5. -Pulmonary toilet.  Large right pleural effusion?  CXR on 2/6 showed large right-sided pleural effusion.  IR consulted and did not feel there is enough fluid to safely perform thoracocentesis.  Respiratory status improved. -Will continue monitoring clinically  MCA territory CVA: CT head showed new acute to subacute infarction in the anterior MCA territory. -Continue aspirin, Plavix and Crestor.  Acute metabolic encephalopathy, resolved: Awake and oriented  to self, place and situation.   -Reorientation and delirium precautions. -Treat treatable causes.  Essential hypertension: Normotensive. - Lisinopril 20 mg PO daily    Chronic pain/thoracic scoliosis: Was on gabapentin 800 mg 3 times daily as needed but has been getting only once a day on average. -Schedule gabapentin  at 400 mg daily nightly. -Schedule Tylenol. -As needed oxycodone  Iron deficiency anemia: H&H stable after initial drop.  Anemia panel with some degree of iron deficiency. Recent Labs    04/21/22 0939 05/24/22 1121 05/26/22 1328 05/27/22 0230 05/28/22 0336 05/29/22 0713 05/30/22 0620 05/31/22 0648 06/01/22 0519 06/02/22 0406  HGB 9.2* 9.0* 9.2* 7.9* 8.0* 8.2* 8.2* 8.5* 7.8* 7.7*  -IV iron  Leukocytosis/bandemia: Due to steroid? -Continue monitoring.  Epistaxis: Seems to have resolved. -Minimize oxygen units   Severe malnutrition Body mass index is 13.74 kg/m. Nutrition Problem: Severe Malnutrition Etiology: chronic illness (CVA) Signs/Symptoms: percent weight loss, severe fat depletion, severe muscle depletion Interventions: Ensure Enlive (each supplement provides 350kcal and 20 grams of protein), MVI, Magic cup  Pressure skin injury: POA Pressure Injury 04/09/22 Coccyx Stage 2 -  Partial thickness loss of dermis presenting as a shallow open injury with a red, pink wound bed without slough. mostly closed red area but two small openings still (Active)  04/09/22 2015  Location: Coccyx  Location Orientation:   Staging: Stage 2 -  Partial thickness loss of dermis presenting as a shallow open injury with a red, pink wound bed without slough.  Wound Description (Comments): mostly closed red area but two small openings still  Present on Admission: Yes  Dressing Type Foam - Lift dressing to assess site every shift 06/01/22 2145   DVT prophylaxis:  enoxaparin (LOVENOX) injection 30 mg Start: 05/27/22 2200  Code Status: DNR/DNI Family Communication: None at bedside Level of care: Med-Surg Status is: Inpatient Remains inpatient appropriate because: Acute respiratory failure with hypoxia, COVID-19 infection and pleural effusion   Final disposition: Home with home hospice on 2/9. Consultants:  IR  35 minutes with more than 50% spent in reviewing records, counseling  patient/family and coordinating care.   Sch Meds:  Scheduled Meds:  acetaminophen  650 mg Oral Q6H WA   vitamin C  500 mg Oral BID   aspirin EC  81 mg Oral Daily   Chlorhexidine Gluconate Cloth  6 each Topical Daily   clopidogrel  75 mg Oral Daily   dexamethasone (DECADRON) injection  6 mg Intravenous Q1400   enoxaparin (LOVENOX) injection  30 mg Subcutaneous Q24H   feeding supplement  237 mL Oral TID BM   furosemide  20 mg Oral Daily   gabapentin  400 mg Oral QHS   guaiFENesin  600 mg Oral BID   Ipratropium-Albuterol  2 puff Inhalation Q6H   latanoprost  1 drop Both Eyes QHS   lisinopril  20 mg Oral Daily   mirtazapine  15 mg Oral QHS   multivitamin with minerals  1 tablet Oral Daily   rosuvastatin  20 mg Oral Daily   zinc sulfate  220 mg Oral Daily   Continuous Infusions:  sodium chloride 10 mL/hr at 05/28/22 1830   PRN Meds:.sodium chloride, hydrALAZINE, oxyCODONE  Antimicrobials: Anti-infectives (From admission, onward)    Start     Dose/Rate Route Frequency Ordered Stop   05/28/22 1000  remdesivir 100 mg in sodium chloride 0.9 % 100 mL IVPB       See Hyperspace for full Linked Orders Report.   100 mg 200 mL/hr  over 30 Minutes Intravenous Daily 05/28/22 0940 05/30/22 1242   05/27/22 1000  remdesivir 100 mg in sodium chloride 0.9 % 100 mL IVPB  Status:  Discontinued       See Hyperspace for full Linked Orders Report.   100 mg 200 mL/hr over 30 Minutes Intravenous Daily 05/26/22 1543 05/28/22 0940   05/26/22 2200  piperacillin-tazobactam (ZOSYN) IVPB 3.375 g        3.375 g 12.5 mL/hr over 240 Minutes Intravenous Every 8 hours 05/26/22 1551 05/30/22 2130   05/26/22 1545  remdesivir 200 mg in sodium chloride 0.9% 250 mL IVPB       See Hyperspace for full Linked Orders Report.   200 mg 580 mL/hr over 30 Minutes Intravenous Once 05/26/22 1543 05/26/22 1801   05/26/22 1500  vancomycin (VANCOREADY) IVPB 1250 mg/250 mL        1,250 mg 166.7 mL/hr over 90 Minutes  Intravenous  Once 05/26/22 1402 05/26/22 1646   05/26/22 1345  piperacillin-tazobactam (ZOSYN) IVPB 3.375 g        3.375 g 100 mL/hr over 30 Minutes Intravenous  Once 05/26/22 1333 05/26/22 1431   05/26/22 1345  azithromycin (ZITHROMAX) 500 mg in sodium chloride 0.9 % 250 mL IVPB        500 mg 250 mL/hr over 60 Minutes Intravenous  Once 05/26/22 1333 05/26/22 1528        I have personally reviewed the following labs and images: CBC: Recent Labs  Lab 05/26/22 1328 05/27/22 0230 05/29/22 0713 05/30/22 0620 05/31/22 0648 06/01/22 0519 06/02/22 0406  WBC 12.6*   < > 9.3 12.6* 19.0* 23.2* 15.9*  NEUTROABS 10.5*  --   --   --   --   --  14.3*  HGB 9.2*   < > 8.2* 8.2* 8.5* 7.8* 7.7*  HCT 30.6*   < > 26.2* 26.3* 26.9* 24.6* 24.5*  MCV 89.2   < > 85.3 84.3 84.3 83.7 84.2  PLT 412*   < > 521* 532* 618* 613* 636*   < > = values in this interval not displayed.   BMP &GFR Recent Labs  Lab 05/27/22 0230 05/28/22 0336 05/29/22 0713 05/30/22 0620 05/31/22 0648 06/01/22 0519 06/02/22 0406 06/02/22 0407  NA 138 137 137 135 136 133*  --  135  K 4.4 4.1 4.3 4.3 4.6 3.9  --  4.1  CL 105 102 101 98 96* 96*  --  97*  CO2 '30 28 31 29 '$ 32 30  --  32  GLUCOSE 158* 87 80 97 70 99  --  117*  BUN 17 19 24* 28* 26* 28*  --  29*  CREATININE 0.75 0.83 0.90 0.85 0.85 0.78  --  0.71  CALCIUM 6.9* 7.4* 7.7* 7.9* 8.1* 7.8*  --  8.1*  MG 1.9 1.9 2.1  --   --   --  2.3  --   PHOS 3.1 2.8 3.7  --   --   --   --  4.2   Estimated Creatinine Clearance: 46.6 mL/min (by C-G formula based on SCr of 0.71 mg/dL). Liver & Pancreas: Recent Labs  Lab 05/26/22 1328 05/27/22 0230 05/28/22 0336 05/29/22 0713 06/01/22 0831 06/02/22 0407  AST 29 26 33 20 21  --   ALT '26 24 29 24 19  '$ --   ALKPHOS 70 67 60 62 63  --   BILITOT 0.5 0.5 0.6 0.5 0.6  --   PROT 6.4* 5.7* 5.5* 5.4* 5.6*  --  ALBUMIN 2.6* 2.3* 2.3* 2.3* 2.3* 2.4*   No results for input(s): "LIPASE", "AMYLASE" in the last 168 hours. No  results for input(s): "AMMONIA" in the last 168 hours. Diabetic: No results for input(s): "HGBA1C" in the last 72 hours. No results for input(s): "GLUCAP" in the last 168 hours. Cardiac Enzymes: No results for input(s): "CKTOTAL", "CKMB", "CKMBINDEX", "TROPONINI" in the last 168 hours. No results for input(s): "PROBNP" in the last 8760 hours. Coagulation Profile: Recent Labs  Lab 05/26/22 1328  INR 1.1   Thyroid Function Tests: No results for input(s): "TSH", "T4TOTAL", "FREET4", "T3FREE", "THYROIDAB" in the last 72 hours. Lipid Profile: No results for input(s): "CHOL", "HDL", "LDLCALC", "TRIG", "CHOLHDL", "LDLDIRECT" in the last 72 hours. Anemia Panel: No results for input(s): "VITAMINB12", "FOLATE", "FERRITIN", "TIBC", "IRON", "RETICCTPCT" in the last 72 hours. Urine analysis:    Component Value Date/Time   COLORURINE YELLOW (A) 05/30/2022 0015   APPEARANCEUR HAZY (A) 05/30/2022 0015   APPEARANCEUR Clear 02/01/2014 1824   LABSPEC 1.021 05/30/2022 0015   LABSPEC 1.006 02/01/2014 1824   PHURINE 5.0 05/30/2022 0015   GLUCOSEU NEGATIVE 05/30/2022 0015   GLUCOSEU Negative 02/01/2014 1824   HGBUR NEGATIVE 05/30/2022 0015   BILIRUBINUR NEGATIVE 05/30/2022 0015   BILIRUBINUR Negative 02/01/2014 1824   KETONESUR NEGATIVE 05/30/2022 0015   PROTEINUR NEGATIVE 05/30/2022 0015   NITRITE NEGATIVE 05/30/2022 0015   LEUKOCYTESUR NEGATIVE 05/30/2022 0015   LEUKOCYTESUR Negative 02/01/2014 1824   Sepsis Labs: Invalid input(s): "PROCALCITONIN", "LACTICIDVEN"  Microbiology: Recent Results (from the past 240 hour(s))  Blood Culture (routine x 2)     Status: None   Collection Time: 05/26/22  1:28 PM   Specimen: BLOOD  Result Value Ref Range Status   Specimen Description BLOOD BLOOD LEFT ARM  Final   Special Requests   Final    BOTTLES DRAWN AEROBIC AND ANAEROBIC Blood Culture adequate volume   Culture   Final    NO GROWTH 5 DAYS Performed at [facility], [address] [place], [place] [address]    Report Status 05/31/2022 FINAL  Final  Blood Culture (routine x 2)     Status: None   Collection Time: 05/26/22  1:28 PM   Specimen: BLOOD  Result Value Ref Range Status   Specimen Description BLOOD BLOOD RIGHT ARM  Final   Special Requests   Final    BOTTLES DRAWN AEROBIC AND ANAEROBIC Blood Culture adequate volume   Culture   Final    NO GROWTH 5 DAYS Performed at [facility], [address] [place], [place] [address]    Report Status 05/31/2022 FINAL  Final  Resp panel by RT-PCR (RSV, Flu A&B, Covid) Anterior Nasal Swab     Status: Abnormal   Collection Time: 05/26/22  1:28 PM   Specimen: Anterior Nasal Swab  Result Value Ref Range Status   SARS Coronavirus 2 by RT PCR POSITIVE (A) NEGATIVE Final    Comment: (NOTE) SARS-CoV-2 target nucleic acids are DETECTED.  The SARS-CoV-2 RNA is generally detectable in upper respiratory specimens during the acute phase of infection. Positive results are indicative of the presence of the identified virus, but do not rule out bacterial infection or co-infection with other pathogens not detected by the test. Clinical correlation with patient history and other diagnostic information is necessary to determine patient infection status. The expected result is Negative.  Fact Sheet for Patients: EntrepreneurPulse.com.au  Fact Sheet for Healthcare Providers: IncredibleEmployment.be  This test is not yet approved or cleared  by the [place] and  has been authorized for detection and/or diagnosis of SARS-CoV-2 by FDA under an Emergency Use Authorization (EUA).  This EUA will remain in effect (meaning this test can be used) for the duration of  the COVID-19 declaration under Section 564(b)(1) of the A ct, 21 U.S.C. section 360bbb-3(b)(1), unless the authorization is terminated or revoked sooner.     Influenza A by PCR NEGATIVE NEGATIVE Final   Influenza B by  PCR NEGATIVE NEGATIVE Final    Comment: (NOTE) The Xpert Xpress SARS-CoV-2/FLU/RSV plus assay is intended as an aid in the diagnosis of influenza from Nasopharyngeal swab specimens and should not be used as a sole basis for treatment. Nasal washings and aspirates are unacceptable for Xpert Xpress SARS-CoV-2/FLU/RSV testing.  Fact Sheet for Patients: EntrepreneurPulse.com.au  Fact Sheet for Healthcare Providers: IncredibleEmployment.be  This test is not yet approved or cleared by the [place] FDA and has been authorized for detection and/or diagnosis of SARS-CoV-2 by FDA under an Emergency Use Authorization (EUA). This EUA will remain in effect (meaning this test can be used) for the duration of the COVID-19 declaration under Section 564(b)(1) of the Act, 21 U.S.C. section 360bbb-3(b)(1), unless the authorization is terminated or revoked.     Resp Syncytial Virus by PCR NEGATIVE NEGATIVE Final    Comment: (NOTE) Fact Sheet for Patients: EntrepreneurPulse.com.au  Fact Sheet for Healthcare Providers: IncredibleEmployment.be  This test is not yet approved or cleared by the [place] FDA and has been authorized for detection and/or diagnosis of SARS-CoV-2 by FDA under an Emergency Use Authorization (EUA). This EUA will remain in effect (meaning this test can be used) for the duration of the COVID-19 declaration under Section 564(b)(1) of the Act, 21 U.S.C. section 360bbb-3(b)(1), unless the authorization is terminated or revoked.  Performed at [facility], [address] [place], [place] [address]   MRSA Next Gen by PCR, Nasal     Status: None   Collection Time: 05/26/22  5:15 PM   Specimen: Nasal Mucosa; Nasal Swab  Result Value Ref Range Status   MRSA by PCR Next Gen NOT DETECTED NOT DETECTED Final    Comment: (NOTE) The GeneXpert MRSA Assay (FDA approved for NASAL specimens  only), is one component of a comprehensive MRSA colonization surveillance program. It is not intended to diagnose MRSA infection nor to guide or monitor treatment for MRSA infections. Test performance is not FDA approved in patients less than 54 years old. Performed at [facility], [address] [place], [place] [address]     Radiology Studies: [place] CHEST (PLEURAL EFFUSION)  Result Date: 06/01/2022 CLINICAL DATA:  Pleural effusion EXAM: CHEST ULTRASOUND COMPARISON:  chest x-ray 05/31/2022 FINDINGS: Sonographic interrogation of the right chest demonstrates trace pleural effusion. There was not a safe window for thoracentesis. Thoracentesis was deferred. IMPRESSION: Trace right pleural effusion, insufficient for thoracentesis. Electronically Signed   By: Jacqulynn Cadet M.D.   On: 06/01/2022 15:30      Luevenia Mcavoy T. [place]  If 7PM-7AM, please contact night-coverage [email or web address] 06/02/2022, 1:01 PM

## 2022-06-03 ENCOUNTER — Ambulatory Visit: Payer: Medicare PPO | Admitting: Family Medicine

## 2022-06-03 ENCOUNTER — Telehealth: Payer: Self-pay

## 2022-06-03 DIAGNOSIS — J9601 Acute respiratory failure with hypoxia: Secondary | ICD-10-CM | POA: Diagnosis not present

## 2022-06-03 DIAGNOSIS — U071 COVID-19: Secondary | ICD-10-CM | POA: Diagnosis not present

## 2022-06-03 DIAGNOSIS — J441 Chronic obstructive pulmonary disease with (acute) exacerbation: Secondary | ICD-10-CM | POA: Diagnosis not present

## 2022-06-03 DIAGNOSIS — M5441 Lumbago with sciatica, right side: Secondary | ICD-10-CM | POA: Diagnosis not present

## 2022-06-03 DIAGNOSIS — Z515 Encounter for palliative care: Secondary | ICD-10-CM

## 2022-06-03 LAB — CBC
HCT: 23.8 % — ABNORMAL LOW (ref 39.0–52.0)
Hemoglobin: 7.6 g/dL — ABNORMAL LOW (ref 13.0–17.0)
MCH: 26.7 pg (ref 26.0–34.0)
MCHC: 31.9 g/dL (ref 30.0–36.0)
MCV: 83.5 fL (ref 80.0–100.0)
Platelets: 685 10*3/uL — ABNORMAL HIGH (ref 150–400)
RBC: 2.85 MIL/uL — ABNORMAL LOW (ref 4.22–5.81)
RDW: 17.3 % — ABNORMAL HIGH (ref 11.5–15.5)
WBC: 15.2 10*3/uL — ABNORMAL HIGH (ref 4.0–10.5)
nRBC: 0 % (ref 0.0–0.2)

## 2022-06-03 LAB — PROCALCITONIN: Procalcitonin: 0.35 ng/mL

## 2022-06-03 MED ORDER — PANTOPRAZOLE SODIUM 40 MG PO TBEC: 40.0000 mg | DELAYED_RELEASE_TABLET | Freq: Every day | ORAL | 1 refills | Status: AC

## 2022-06-03 NOTE — Consult Note (Signed)
   [facility] Inpatient Consult   06/03/2022  [name] August 19, 1955 [id number]  [place] [facility] [ACO] Patient: Lucas Ramirez PPO  *[place] [facility] Liaison remote coverage review for patient admitted to [facility]   Primary Care Provider:  Birdie Sons, MD, with [facility] which is listed to provide the transition of care follow up   Patient screened for hospitalization with noted extreme high risk score for unplanned readmission risk and length of stay. Patient's electronic medical record was reviewed to assess for potential [place] Management service needs for post hospital transition for care coordination.  Review of patient's electronic medical record reveals patient has been reached by [place] Coordination team from last hospitalization and care coordination received. Review of inpatient TOC LCSW notes today patient is to transition to home with Home Hospice with [place]:   Referral request for community care coordination: If patient transitions home with home hospice the hospice team with provide patient with care coordination.  *Will sign off at transition  Of note, [place] does not replace or interfere with any arrangements made by the Inpatient Transition of Care team.  For questions contact:   Natividad Brood, RN BSN [place]  [phone] business mobile phone Toll free office [phone]  *[place]  [phone] Fax number: [phone] [email or web address]

## 2022-06-03 NOTE — Progress Notes (Deleted)
Established patient visit   Patient: Lucas Ramirez   DOB: 02-Dec-1955   67 y.o. Male  MRN: [id number] Visit Date: 06/03/2022  Today's healthcare provider: Lelon Huh, MD   No chief complaint on file.  Subjective    HPI   Follow up Hospitalization  Patient was admitted to [facility] on 05/27/2022 and discharged on ***. He was treated for COPD exacerbation.  He reports {excellent/good/fair:19992} compliance with treatment. He reports this condition is {resolved/improved/worsened:23923}.  ----------------------------------------------------------------------------------------- -    Medications: Facility-Administered Medications Prior to Visit  Medication Dose Route Frequency Provider   0.9 %  sodium chloride infusion   Intravenous PRN Cyndia Skeeters, Charlesetta Ivory, MD   acetaminophen (TYLENOL) tablet 650 mg  650 mg Oral Q6H WA Gonfa, Taye T, MD   ascorbic acid (VITAMIN C) tablet 500 mg  500 mg Oral BID Mercy Riding, MD   aspirin EC tablet 81 mg  81 mg Oral Daily Gonfa, Taye T, MD   Chlorhexidine Gluconate Cloth 2 % PADS 6 each  6 each Topical Daily Gonfa, Taye T, MD   clopidogrel (PLAVIX) tablet 75 mg  75 mg Oral Daily Gonfa, Taye T, MD   enoxaparin (LOVENOX) injection 30 mg  30 mg Subcutaneous Q24H Gonfa, Taye T, MD   feeding supplement (ENSURE ENLIVE / ENSURE PLUS) liquid 237 mL  237 mL Oral TID BM Gonfa, Taye T, MD   furosemide (LASIX) tablet 20 mg  20 mg Oral Daily Gonfa, Taye T, MD   gabapentin (NEURONTIN) capsule 400 mg  400 mg Oral QHS Gonfa, Taye T, MD   guaiFENesin (MUCINEX) 12 hr tablet 600 mg  600 mg Oral BID Gonfa, Taye T, MD   hydrALAZINE (APRESOLINE) injection 5 mg  5 mg Intravenous Q4H PRN Gonfa, Taye T, MD   Ipratropium-Albuterol (COMBIVENT) respimat 2 puff  2 puff Inhalation Q6H Gonfa, Taye T, MD   latanoprost (XALATAN) 0.005 % ophthalmic solution 1 drop  1 drop Both Eyes QHS Gonfa, Taye T, MD   lisinopril (ZESTRIL) tablet 20 mg  20 mg Oral Daily Gonfa, Taye T, MD    mirtazapine (REMERON) tablet 15 mg  15 mg Oral QHS Gonfa, Taye T, MD   multivitamin with minerals tablet 1 tablet  1 tablet Oral Daily Gonfa, Taye T, MD   oxyCODONE (Oxy IR/ROXICODONE) immediate release tablet 5 mg  5 mg Oral Q6H PRN Gonfa, Taye T, MD   rosuvastatin (CRESTOR) tablet 20 mg  20 mg Oral Daily Gonfa, Taye T, MD   zinc sulfate capsule 220 mg  220 mg Oral Daily Mercy Riding, MD   Outpatient Medications Prior to Visit  Medication Sig   acetaminophen (TYLENOL) 500 MG tablet Take 1 tablet (500 mg total) by mouth every 6 (six) hours as needed for mild pain.   albuterol (VENTOLIN HFA) 108 (90 Base) MCG/ACT inhaler Inhale 2 puffs into the lungs every 6 (six) hours as needed for wheezing or shortness of breath.   aspirin EC 81 MG tablet Take 1 tablet (81 mg total) by mouth daily. Swallow whole.   calcium carbonate (OSCAL) 1500 (600 Ca) MG TABS tablet Take 600 mg of elemental calcium by mouth 2 (two) times daily with a meal.   cholecalciferol (VITAMIN D) 1000 UNITS tablet Take 1,000 Units by mouth daily.   clopidogrel (PLAVIX) 75 MG tablet TAKE 1 TABLET BY MOUTH ONCE DAILY (Patient taking differently: Take 75 mg by mouth at bedtime.)   cyproheptadine (PERIACTIN) 4 MG tablet Take 1  tablet (4 mg total) by mouth 3 (three) times daily as needed (appetite).   denosumab (PROLIA) 60 MG/ML SOSY injection Inject 60 mg into the skin every 6 (six) months.   ferrous sulfate 325 (65 FE) MG tablet Take 1 tablet (325 mg total) by mouth every other day.   furosemide (LASIX) 20 MG tablet Take 1 tablet (20 mg total) by mouth daily.   gabapentin (NEURONTIN) 800 MG tablet TAKE 1 TABLET BY MOUTH 3 TIMES DAILY AS NEEDED AS DIRECTED   latanoprost (XALATAN) 0.005 % ophthalmic solution Place 1-2 drops into both eyes See admin instructions. Instill 1 drop into left eye at bedtime Instill 2 drops into right eye at bedtime   lisinopril (ZESTRIL) 20 MG tablet TAKE 1 TABLET BY MOUTH ONCE DAILY   megestrol (MEGACE) 400  MG/10ML suspension Take 20 mLs (800 mg total) by mouth daily.   mirtazapine (REMERON) 15 MG tablet TAKE 1/2-1 TABLET BY MOUTH AT BEDTIME   omeprazole (PRILOSEC) 20 MG capsule TAKE 1 CAPSULE BY MOUTH ONCE DAILY   oxyCODONE (OXY IR/ROXICODONE) 5 MG immediate release tablet TAKE 1 TABLET BY MOUTH EVERY 4 HOURS   pantoprazole (PROTONIX) 40 MG tablet Take 1 tablet (40 mg total) by mouth daily.   rosuvastatin (CRESTOR) 20 MG tablet TAKE 1 TABLET BY MOUTH AT BEDTIME    Review of Systems  {Labs  Heme  Chem  Endocrine  Serology  Results Review (optional):23779}   Objective    There were no vitals taken for this visit. {Show previous vital signs (optional):23777}  Physical Exam  ***  Results for orders placed or performed during the hospital encounter of 05/26/22  Blood Culture (routine x 2)   Specimen: BLOOD  Result Value Ref Range   Specimen Description BLOOD BLOOD LEFT ARM    Special Requests      BOTTLES DRAWN AEROBIC AND ANAEROBIC Blood Culture adequate volume   Culture      NO GROWTH 5 DAYS Performed at [facility] [place], [address] [place], [place] [address]    Report Status 05/31/2022 FINAL   Blood Culture (routine x 2)   Specimen: BLOOD  Result Value Ref Range   Specimen Description BLOOD BLOOD RIGHT ARM    Special Requests      BOTTLES DRAWN AEROBIC AND ANAEROBIC Blood Culture adequate volume   Culture      NO GROWTH 5 DAYS Performed at [facility], [address], [place], [place] [address]    Report Status 05/31/2022 FINAL   Resp panel by RT-PCR (RSV, Flu A&B, Covid) Anterior Nasal Swab   Specimen: Anterior Nasal Swab  Result Value Ref Range   SARS Coronavirus 2 by RT PCR POSITIVE (A) NEGATIVE   Influenza A by PCR NEGATIVE NEGATIVE   Influenza B by PCR NEGATIVE NEGATIVE   Resp Syncytial Virus by PCR NEGATIVE NEGATIVE  MRSA Next Gen by PCR, Nasal   Specimen: Nasal Mucosa; Nasal Swab  Result Value Ref Range   MRSA by PCR Next Gen NOT  DETECTED NOT DETECTED  Lactic acid, plasma  Result Value Ref Range   Lactic Acid, Venous 1.3 0.5 - 1.9 mmol/L  Lactic acid, plasma  Result Value Ref Range   Lactic Acid, Venous 0.7 0.5 - 1.9 mmol/L  Comprehensive metabolic panel  Result Value Ref Range   Sodium 138 135 - 145 mmol/L   Potassium 4.3 3.5 - 5.1 mmol/L   Chloride 101 98 - 111 mmol/L   CO2 28 22 - 32 mmol/L  Glucose, Bld 127 (H) 70 - 99 mg/dL   BUN 17 8 - 23 mg/dL   Creatinine, Ser 0.71 0.61 - 1.24 mg/dL   Calcium 7.7 (L) 8.9 - 10.3 mg/dL   Total Protein 6.4 (L) 6.5 - 8.1 g/dL   Albumin 2.6 (L) 3.5 - 5.0 g/dL   AST 29 15 - 41 U/L   ALT 26 0 - 44 U/L   Alkaline Phosphatase 70 38 - 126 U/L   Total Bilirubin 0.5 0.3 - 1.2 mg/dL   GFR, Estimated >60 >60 mL/min   Anion gap 9 5 - 15  CBC with Differential  Result Value Ref Range   WBC 12.6 (H) 4.0 - 10.5 K/uL   RBC 3.43 (L) 4.22 - 5.81 MIL/uL   Hemoglobin 9.2 (L) 13.0 - 17.0 g/dL   HCT 30.6 (L) 39.0 - 52.0 %   MCV 89.2 80.0 - 100.0 fL   MCH 26.8 26.0 - 34.0 pg   MCHC 30.1 30.0 - 36.0 g/dL   RDW 16.5 (H) 11.5 - 15.5 %   Platelets 412 (H) 150 - 400 K/uL   nRBC 0.0 0.0 - 0.2 %   Neutrophils Relative % 83 %   Neutro Abs 10.5 (H) 1.7 - 7.7 K/uL   Lymphocytes Relative 9 %   Lymphs Abs 1.1 0.7 - 4.0 K/uL   Monocytes Relative 7 %   Monocytes Absolute 0.9 0.1 - 1.0 K/uL   Eosinophils Relative 0 %   Eosinophils Absolute 0.0 0.0 - 0.5 K/uL   Basophils Relative 0 %   Basophils Absolute 0.0 0.0 - 0.1 K/uL   Immature Granulocytes 1 %   Abs Immature Granulocytes 0.09 (H) 0.00 - 0.07 K/uL  Protime-INR  Result Value Ref Range   Prothrombin Time 14.5 11.4 - 15.2 seconds   INR 1.1 0.8 - 1.2  APTT  Result Value Ref Range   aPTT 33 24 - 36 seconds  Brain natriuretic peptide  Result Value Ref Range   B Natriuretic Peptide 192.0 (H) 0.0 - 100.0 pg/mL  Blood gas, venous  Result Value Ref Range   pH, Ven 7.32 7.25 - 7.43   pCO2, Ven 65 (H) 44 - 60 mmHg   pO2, Ven 50 (H) 32  - 45 mmHg   Bicarbonate 33.5 (H) 20.0 - 28.0 mmol/L   Acid-Base Excess 5.3 (H) 0.0 - 2.0 mmol/L   O2 Saturation 83.5 %   Patient temperature 37.0    Collection site VEIN   CBC  Result Value Ref Range   WBC 12.8 (H) 4.0 - 10.5 K/uL   RBC 2.89 (L) 4.22 - 5.81 MIL/uL   Hemoglobin 7.9 (L) 13.0 - 17.0 g/dL   HCT 25.3 (L) 39.0 - 52.0 %   MCV 87.5 80.0 - 100.0 fL   MCH 27.3 26.0 - 34.0 pg   MCHC 31.2 30.0 - 36.0 g/dL   RDW 16.4 (H) 11.5 - 15.5 %   Platelets 370 150 - 400 K/uL   nRBC 0.0 0.0 - 0.2 %  Comprehensive metabolic panel  Result Value Ref Range   Sodium 138 135 - 145 mmol/L   Potassium 4.4 3.5 - 5.1 mmol/L   Chloride 105 98 - 111 mmol/L   CO2 30 22 - 32 mmol/L   Glucose, Bld 158 (H) 70 - 99 mg/dL   BUN 17 8 - 23 mg/dL   Creatinine, Ser 0.75 0.61 - 1.24 mg/dL   Calcium 6.9 (L) 8.9 - 10.3 mg/dL   Total Protein 5.7 (L) 6.5 -  8.1 g/dL   Albumin 2.3 (L) 3.5 - 5.0 g/dL   AST 26 15 - 41 U/L   ALT 24 0 - 44 U/L   Alkaline Phosphatase 67 38 - 126 U/L   Total Bilirubin 0.5 0.3 - 1.2 mg/dL   GFR, Estimated >60 >60 mL/min   Anion gap 3 (L) 5 - 15  Magnesium  Result Value Ref Range   Magnesium 1.9 1.7 - 2.4 mg/dL  Phosphorus  Result Value Ref Range   Phosphorus 3.1 2.5 - 4.6 mg/dL  C-reactive protein  Result Value Ref Range   CRP 9.8 (H) <1.0 mg/dL  CBC  Result Value Ref Range   WBC 9.9 4.0 - 10.5 K/uL   RBC 2.96 (L) 4.22 - 5.81 MIL/uL   Hemoglobin 8.0 (L) 13.0 - 17.0 g/dL   HCT 25.3 (L) 39.0 - 52.0 %   MCV 85.5 80.0 - 100.0 fL   MCH 27.0 26.0 - 34.0 pg   MCHC 31.6 30.0 - 36.0 g/dL   RDW 16.4 (H) 11.5 - 15.5 %   Platelets 438 (H) 150 - 400 K/uL   nRBC 0.0 0.0 - 0.2 %  Comprehensive metabolic panel  Result Value Ref Range   Sodium 137 135 - 145 mmol/L   Potassium 4.1 3.5 - 5.1 mmol/L   Chloride 102 98 - 111 mmol/L   CO2 28 22 - 32 mmol/L   Glucose, Bld 87 70 - 99 mg/dL   BUN 19 8 - 23 mg/dL   Creatinine, Ser 0.83 0.61 - 1.24 mg/dL   Calcium 7.4 (L) 8.9 - 10.3  mg/dL   Total Protein 5.5 (L) 6.5 - 8.1 g/dL   Albumin 2.3 (L) 3.5 - 5.0 g/dL   AST 33 15 - 41 U/L   ALT 29 0 - 44 U/L   Alkaline Phosphatase 60 38 - 126 U/L   Total Bilirubin 0.6 0.3 - 1.2 mg/dL   GFR, Estimated >60 >60 mL/min   Anion gap 7 5 - 15  Magnesium  Result Value Ref Range   Magnesium 1.9 1.7 - 2.4 mg/dL  Phosphorus  Result Value Ref Range   Phosphorus 2.8 2.5 - 4.6 mg/dL  C-reactive protein  Result Value Ref Range   CRP 5.5 (H) <1.0 mg/dL  CBC  Result Value Ref Range   WBC 9.3 4.0 - 10.5 K/uL   RBC 3.07 (L) 4.22 - 5.81 MIL/uL   Hemoglobin 8.2 (L) 13.0 - 17.0 g/dL   HCT 26.2 (L) 39.0 - 52.0 %   MCV 85.3 80.0 - 100.0 fL   MCH 26.7 26.0 - 34.0 pg   MCHC 31.3 30.0 - 36.0 g/dL   RDW 16.5 (H) 11.5 - 15.5 %   Platelets 521 (H) 150 - 400 K/uL   nRBC 0.0 0.0 - 0.2 %  Comprehensive metabolic panel  Result Value Ref Range   Sodium 137 135 - 145 mmol/L   Potassium 4.3 3.5 - 5.1 mmol/L   Chloride 101 98 - 111 mmol/L   CO2 31 22 - 32 mmol/L   Glucose, Bld 80 70 - 99 mg/dL   BUN 24 (H) 8 - 23 mg/dL   Creatinine, Ser 0.90 0.61 - 1.24 mg/dL   Calcium 7.7 (L) 8.9 - 10.3 mg/dL   Total Protein 5.4 (L) 6.5 - 8.1 g/dL   Albumin 2.3 (L) 3.5 - 5.0 g/dL   AST 20 15 - 41 U/L   ALT 24 0 - 44 U/L   Alkaline Phosphatase 62 38 - 126  U/L   Total Bilirubin 0.5 0.3 - 1.2 mg/dL   GFR, Estimated >60 >60 mL/min   Anion gap 5 5 - 15  Magnesium  Result Value Ref Range   Magnesium 2.1 1.7 - 2.4 mg/dL  Phosphorus  Result Value Ref Range   Phosphorus 3.7 2.5 - 4.6 mg/dL  C-reactive protein  Result Value Ref Range   CRP 3.6 (H) <1.0 mg/dL  CBC  Result Value Ref Range   WBC 12.6 (H) 4.0 - 10.5 K/uL   RBC 3.12 (L) 4.22 - 5.81 MIL/uL   Hemoglobin 8.2 (L) 13.0 - 17.0 g/dL   HCT 26.3 (L) 39.0 - 52.0 %   MCV 84.3 80.0 - 100.0 fL   MCH 26.3 26.0 - 34.0 pg   MCHC 31.2 30.0 - 36.0 g/dL   RDW 16.5 (H) 11.5 - 15.5 %   Platelets 532 (H) 150 - 400 K/uL   nRBC 0.0 0.0 - 0.2 %  Basic metabolic  panel  Result Value Ref Range   Sodium 135 135 - 145 mmol/L   Potassium 4.3 3.5 - 5.1 mmol/L   Chloride 98 98 - 111 mmol/L   CO2 29 22 - 32 mmol/L   Glucose, Bld 97 70 - 99 mg/dL   BUN 28 (H) 8 - 23 mg/dL   Creatinine, Ser 0.85 0.61 - 1.24 mg/dL   Calcium 7.9 (L) 8.9 - 10.3 mg/dL   GFR, Estimated >60 >60 mL/min   Anion gap 8 5 - 15  Urinalysis, w/ Reflex to Culture (Infection Suspected) -  Result Value Ref Range   Specimen Source URINE, CLEAN CATCH    Color, Urine YELLOW (A) YELLOW   APPearance HAZY (A) CLEAR   Specific Gravity, Urine 1.021 1.005 - 1.030   pH 5.0 5.0 - 8.0   Glucose, UA NEGATIVE NEGATIVE mg/dL   Hgb urine dipstick NEGATIVE NEGATIVE   Bilirubin Urine NEGATIVE NEGATIVE   Ketones, ur NEGATIVE NEGATIVE mg/dL   Protein, ur NEGATIVE NEGATIVE mg/dL   Nitrite NEGATIVE NEGATIVE   Leukocytes,Ua NEGATIVE NEGATIVE   RBC / HPF 0-5 0 - 5 RBC/hpf   WBC, UA 0-5 0 - 5 WBC/hpf   Bacteria, UA NONE SEEN NONE SEEN   Squamous Epithelial / HPF NONE SEEN 0 - 5 /HPF   Mucus PRESENT    Ca Oxalate Crys, UA PRESENT   CBC  Result Value Ref Range   WBC 19.0 (H) 4.0 - 10.5 K/uL   RBC 3.19 (L) 4.22 - 5.81 MIL/uL   Hemoglobin 8.5 (L) 13.0 - 17.0 g/dL   HCT 26.9 (L) 39.0 - 52.0 %   MCV 84.3 80.0 - 100.0 fL   MCH 26.6 26.0 - 34.0 pg   MCHC 31.6 30.0 - 36.0 g/dL   RDW 16.7 (H) 11.5 - 15.5 %   Platelets 618 (H) 150 - 400 K/uL   nRBC 0.0 0.0 - 0.2 %  Basic metabolic panel  Result Value Ref Range   Sodium 136 135 - 145 mmol/L   Potassium 4.6 3.5 - 5.1 mmol/L   Chloride 96 (L) 98 - 111 mmol/L   CO2 32 22 - 32 mmol/L   Glucose, Bld 70 70 - 99 mg/dL   BUN 26 (H) 8 - 23 mg/dL   Creatinine, Ser 0.85 0.61 - 1.24 mg/dL   Calcium 8.1 (L) 8.9 - 10.3 mg/dL   GFR, Estimated >60 >60 mL/min   Anion gap 8 5 - 15  CBC  Result Value Ref Range   WBC 23.2 (  H) 4.0 - 10.5 K/uL   RBC 2.94 (L) 4.22 - 5.81 MIL/uL   Hemoglobin 7.8 (L) 13.0 - 17.0 g/dL   HCT 24.6 (L) 39.0 - 52.0 %   MCV 83.7 80.0 -  100.0 fL   MCH 26.5 26.0 - 34.0 pg   MCHC 31.7 30.0 - 36.0 g/dL   RDW 16.9 (H) 11.5 - 15.5 %   Platelets 613 (H) 150 - 400 K/uL   nRBC 0.1 0.0 - 0.2 %  Basic metabolic panel  Result Value Ref Range   Sodium 133 (L) 135 - 145 mmol/L   Potassium 3.9 3.5 - 5.1 mmol/L   Chloride 96 (L) 98 - 111 mmol/L   CO2 30 22 - 32 mmol/L   Glucose, Bld 99 70 - 99 mg/dL   BUN 28 (H) 8 - 23 mg/dL   Creatinine, Ser 0.78 0.61 - 1.24 mg/dL   Calcium 7.8 (L) 8.9 - 10.3 mg/dL   GFR, Estimated >60 >60 mL/min   Anion gap 7 5 - 15  Procalcitonin - Baseline  Result Value Ref Range   Procalcitonin 0.25 ng/mL  Hepatic function panel  Result Value Ref Range   Total Protein 5.6 (L) 6.5 - 8.1 g/dL   Albumin 2.3 (L) 3.5 - 5.0 g/dL   AST 21 15 - 41 U/L   ALT 19 0 - 44 U/L   Alkaline Phosphatase 63 38 - 126 U/L   Total Bilirubin 0.6 0.3 - 1.2 mg/dL   Bilirubin, Direct <0.1 0.0 - 0.2 mg/dL   Indirect Bilirubin NOT CALCULATED 0.3 - 0.9 mg/dL  C-reactive protein  Result Value Ref Range   CRP 8.3 (H) <1.0 mg/dL  Lactate dehydrogenase  Result Value Ref Range   LDH 152 98 - 192 U/L  D-dimer, quantitative  Result Value Ref Range   D-Dimer, Quant 1.76 (H) 0.00 - 0.50 ug/mL-FEU  Procalcitonin  Result Value Ref Range   Procalcitonin 0.27 ng/mL  C-reactive protein  Result Value Ref Range   CRP 7.9 (H) <1.0 mg/dL  CBC with Differential/Platelet  Result Value Ref Range   WBC 15.9 (H) 4.0 - 10.5 K/uL   RBC 2.91 (L) 4.22 - 5.81 MIL/uL   Hemoglobin 7.7 (L) 13.0 - 17.0 g/dL   HCT 24.5 (L) 39.0 - 52.0 %   MCV 84.2 80.0 - 100.0 fL   MCH 26.5 26.0 - 34.0 pg   MCHC 31.4 30.0 - 36.0 g/dL   RDW 17.2 (H) 11.5 - 15.5 %   Platelets 636 (H) 150 - 400 K/uL   nRBC 0.0 0.0 - 0.2 %   Neutrophils Relative % 90 %   Neutro Abs 14.3 (H) 1.7 - 7.7 K/uL   Lymphocytes Relative 4 %   Lymphs Abs 0.7 0.7 - 4.0 K/uL   Monocytes Relative 5 %   Monocytes Absolute 0.8 0.1 - 1.0 K/uL   Eosinophils Relative 0 %   Eosinophils Absolute  0.0 0.0 - 0.5 K/uL   Basophils Relative 0 %   Basophils Absolute 0.0 0.0 - 0.1 K/uL   Immature Granulocytes 1 %   Abs Immature Granulocytes 0.09 (H) 0.00 - 0.07 K/uL  Renal function panel  Result Value Ref Range   Sodium 135 135 - 145 mmol/L   Potassium 4.1 3.5 - 5.1 mmol/L   Chloride 97 (L) 98 - 111 mmol/L   CO2 32 22 - 32 mmol/L   Glucose, Bld 117 (H) 70 - 99 mg/dL   BUN 29 (H) 8 - 23 mg/dL  Creatinine, Ser 0.71 0.61 - 1.24 mg/dL   Calcium 8.1 (L) 8.9 - 10.3 mg/dL   Phosphorus 4.2 2.5 - 4.6 mg/dL   Albumin 2.4 (L) 3.5 - 5.0 g/dL   GFR, Estimated >60 >60 mL/min   Anion gap 6 5 - 15  Magnesium  Result Value Ref Range   Magnesium 2.3 1.7 - 2.4 mg/dL  Brain natriuretic peptide  Result Value Ref Range   B Natriuretic Peptide 85.3 0.0 - 100.0 pg/mL  Procalcitonin  Result Value Ref Range   Procalcitonin 0.35 ng/mL  CBC  Result Value Ref Range   WBC 15.2 (H) 4.0 - 10.5 K/uL   RBC 2.85 (L) 4.22 - 5.81 MIL/uL   Hemoglobin 7.6 (L) 13.0 - 17.0 g/dL   HCT 23.8 (L) 39.0 - 52.0 %   MCV 83.5 80.0 - 100.0 fL   MCH 26.7 26.0 - 34.0 pg   MCHC 31.9 30.0 - 36.0 g/dL   RDW 17.3 (H) 11.5 - 15.5 %   Platelets 685 (H) 150 - 400 K/uL   nRBC 0.0 0.0 - 0.2 %  Troponin I (High Sensitivity)  Result Value Ref Range   Troponin I (High Sensitivity) 12 <18 ng/L  Troponin I (High Sensitivity)  Result Value Ref Range   Troponin I (High Sensitivity) 11 <18 ng/L    Assessment & Plan     ***  No follow-ups on file.      {provider attestation***:1}   Lelon Huh, MD  [facility] [phone] (phone) [phone] (fax)  [employer]

## 2022-06-03 NOTE — Discharge Summary (Signed)
Physician Discharge Summary  TALLEN COLAVITO [id number] DOB: 03-23-1956 DOA: 05/26/2022  PCP: Birdie Sons, MD  Admit date: 05/26/2022 Discharge date: 06/03/2022 Admitted From: Home. Disposition: Home with home hospice Recommendations for Outpatient Follow-up: Defer to hospice.   Discharge Condition: Stable but guarded prognosis CODE STATUS: DNR/DNI  Follow-up Information     Birdie Sons, MD. Schedule an appointment as soon as possible for a visit in 1 week(s).   Specialty: Family Medicine Contact information: [address] [place] [address] [facility] [phone] with hx of CVA, GERD, depression, HTN, cachexia of unclear etiology who is DNR and admitted with hypoxic respiratory failure due to COVID, possible bacterial pneumonia.  Required up to 3 L by nasal cannula.   Patient completed 5 days of IV Zosyn on 2/5.also completed 5 days of remdesivir.  He is on Decadron 6 mg daily.  Due to ongoing oxygen requirement and respiratory distress, chest x-ray obtained on 2/6 and showed large right pleural effusion.  IR consulted and did not feel there is enough fluid to aspirate safely.   Patient's oxygen requirement improved.  Able to wean to room air.  Patient to discharge home with home hospice on 2/9.  Hospice can decide about oxygen for comfort.   See individual problem list below for more.   Problems addressed during this hospitalization Principal Problem:   COVID-19 Active Problems:   COPD exacerbation (HCC)   Back pain, chronic   Scoliosis   Acute respiratory failure with hypoxia (HCC)   Polyneuropathy   History of CVA (cerebrovascular accident)   Tobacco dependence   Hypertension, essential   Protein-calorie malnutrition, severe   Chronic diastolic CHF (congestive heart failure) (HCC)   Interstitial lung disease (HCC)   Recurrent right pleural effusion   Acute respiratory failure with hypoxia: Multifactorial  including COVID-19 infection, bacterial infection, pleural effusion, scoliosis and underlying ILD.  COVID-19 PCR positive on 2/1.  Respiratory failure resolved.  Saturating at 95% on RA.   COVID-19 infection: Respiratory failure has resolved. -Completed 5 days of remdesivir and 8 days of Decadron..   Bacterial pneumonia: Completed 5 days of antibiotics with IV Zosyn on 2/5.   Large right pleural effusion?  CXR on 2/6 showed large right-sided pleural effusion.  IR consulted and did not feel there is enough fluid to safely perform thoracocentesis.  Respiratory failure resolved.   MCA territory CVA: CT head showed new acute to subacute infarction in the anterior MCA territory. -Continue aspirin, Plavix and Crestor.   Acute metabolic encephalopathy, resolved: Awake and oriented to self, place and situation.   -Reorientation and delirium precautions.   Essential hypertension: Normotensive. -Continue home lisinopril and Lasix.   Chronic pain/thoracic scoliosis: Was on gabapentin 800 mg 3 times daily as needed but has been getting only once a day on average. -Continue home gabapentin, Tylenol and oxycodone.   Iron deficiency anemia: H&H stable after initial drop.  Anemia panel with some degree of iron deficiency. -Received IV iron in house.  Leukocytosis/bandemia: Due to steroid?   Epistaxis: Seems to have resolved.   Severe malnutrition Nutrition Problem: Severe Malnutrition Etiology: chronic illness (CVA) Signs/Symptoms: percent weight loss, severe fat depletion, severe muscle depletion Interventions: Ensure Enlive (each supplement provides 350kcal and 20 grams of protein), MVI, Magic cup  Vital signs Vitals:   06/02/22 1704 06/02/22 2001 06/03/22 0622 06/03/22 0827  BP: 108/84 Marland Kitchen)  126/53 127/65 134/69  Pulse: 94 100 95 92  Temp: 98.9 F (37.2 C) 98.4 F (36.9 C) 98 F (36.7 C) 98.4 F (36.9 C)  Resp: 18 19 20 16  $ Height:      Weight:      SpO2: 92% 92% 90% 95%  TempSrc:  Oral  Oral Oral  BMI (Calculated):         Discharge exam  GENERAL: Appears frail..  No apparent distress. HEENT: MMM.  Vision and hearing grossly intact.  Temporal wasting. NECK: Supple.  No apparent JVD.  RESP:  No IWOB.  Fair aeration bilaterally. CVS:  RRR. Heart sounds normal.  ABD/GI/GU: BS+. Abd soft, NTND.  MSK/EXT: Significant muscle mass and subcu fat loss.  No edema. SKIN: no apparent skin lesion or wound NEURO: Awake and alert. Oriented appropriately.  No apparent focal neuro deficit. PSYCH: Calm. Normal affect.   Discharge Instructions Discharge Instructions     Diet general   Complete by: As directed    Increase activity slowly   Complete by: As directed    No wound care   Complete by: As directed       Allergies as of 06/03/2022       Reactions   Bupropion Hcl Other (See Comments)   seizures        Medication List     STOP taking these medications    omeprazole 20 MG capsule Commonly known as: PRILOSEC       TAKE these medications    acetaminophen 500 MG tablet Commonly known as: TYLENOL Take 1 tablet (500 mg total) by mouth every 6 (six) hours as needed for mild pain.   albuterol 108 (90 Base) MCG/ACT inhaler Commonly known as: VENTOLIN HFA Inhale 2 puffs into the lungs every 6 (six) hours as needed for wheezing or shortness of breath.   aspirin EC 81 MG tablet Take 1 tablet (81 mg total) by mouth daily. Swallow whole.   calcium carbonate 1500 (600 Ca) MG Tabs tablet Commonly known as: OSCAL Take 600 mg of elemental calcium by mouth 2 (two) times daily with a meal.   cholecalciferol 1000 units tablet Commonly known as: VITAMIN D Take 1,000 Units by mouth daily.   clopidogrel 75 MG tablet Commonly known as: PLAVIX TAKE 1 TABLET BY MOUTH ONCE DAILY What changed: when to take this   cyproheptadine 4 MG tablet Commonly known as: PERIACTIN Take 1 tablet (4 mg total) by mouth 3 (three) times daily as needed (appetite).   denosumab  60 MG/ML Sosy injection Commonly known as: PROLIA Inject 60 mg into the skin every 6 (six) months.   ferrous sulfate 325 (65 FE) MG tablet Take 1 tablet (325 mg total) by mouth every other day.   furosemide 20 MG tablet Commonly known as: LASIX Take 1 tablet (20 mg total) by mouth daily.   gabapentin 800 MG tablet Commonly known as: NEURONTIN TAKE 1 TABLET BY MOUTH 3 TIMES DAILY AS NEEDED AS DIRECTED   latanoprost 0.005 % ophthalmic solution Commonly known as: XALATAN Place 1-2 drops into both eyes See admin instructions. Instill 1 drop into left eye at bedtime Instill 2 drops into right eye at bedtime   lisinopril 20 MG tablet Commonly known as: ZESTRIL TAKE 1 TABLET BY MOUTH ONCE DAILY   megestrol 400 MG/10ML suspension Commonly known as: MEGACE Take 20 mLs (800 mg total) by mouth daily.   mirtazapine 15 MG tablet Commonly known as: REMERON TAKE 1/2-1 TABLET BY MOUTH AT  BEDTIME   oxyCODONE 5 MG immediate release tablet Commonly known as: Oxy IR/ROXICODONE TAKE 1 TABLET BY MOUTH EVERY 4 HOURS   pantoprazole 40 MG tablet Commonly known as: Protonix Take 1 tablet (40 mg total) by mouth daily.   rosuvastatin 20 MG tablet Commonly known as: CRESTOR TAKE 1 TABLET BY MOUTH AT BEDTIME        Consultations: Interventional radiology  Procedures/Studies:   [place] CHEST (PLEURAL EFFUSION)  Result Date: 06/01/2022 CLINICAL DATA:  Pleural effusion EXAM: CHEST ULTRASOUND COMPARISON:  chest x-ray 05/31/2022 FINDINGS: Sonographic interrogation of the right chest demonstrates trace pleural effusion. There was not a safe window for thoracentesis. Thoracentesis was deferred. IMPRESSION: Trace right pleural effusion, insufficient for thoracentesis. Electronically Signed   By: Jacqulynn Cadet M.D.   On: 06/01/2022 15:30   DG Chest 2 View  Result Date: 05/31/2022 CLINICAL DATA:  [id number] Acute hypoxic respiratory failure [facility]) [id number] EXAM: CHEST - 2 VIEW COMPARISON:  05/26/2022  FINDINGS: Cardiac silhouette is prominent. There is pulmonary interstitial prominence with vascular congestion. No focal consolidation. No pneumothorax. There are small pleural effusion on the left and large pleural effusion on the right. Aorta ectatic and calcified. IMPRESSION: Findings suggest CHF with interval worsening. Electronically Signed   By: Sammie Bench M.D.   On: 05/31/2022 08:43   CT HEAD WO CONTRAST (5MM)  Result Date: 05/26/2022 CLINICAL DATA:  Altered mental status EXAM: CT HEAD WITHOUT CONTRAST TECHNIQUE: Contiguous axial images were obtained from the base of the skull through the vertex without intravenous contrast. RADIATION DOSE REDUCTION: This exam was performed according to the departmental dose-optimization program which includes automated exposure control, adjustment of the mA and/or kV according to patient size and/or use of iterative reconstruction technique. COMPARISON:  04/09/2022 FINDINGS: Brain: Subacute to chronic cortical infarction of the posterior right upper lobe (series 3, image 26). New, larger acute to subacute appearing infarction of the more anterior right upper lobe in the anterior MCA territory (series 3, image 21). No definite evidence of hemorrhage hydrocephalus, extra-axial collection or mass lesion/mass effect. Vascular: No hyperdense vessel or unexpected calcification. Skull: Normal. Negative for fracture or focal lesion. Sinuses/Orbits: No acute finding. Other: None. IMPRESSION: 1. New, acute to subacute appearing infarction of the more anterior right upper lobe in the anterior MCA territory. 2. Previously seen subacute to chronic infarction of the posterior right upper lobe. Electronically Signed   By: Delanna Ahmadi M.D.   On: 05/26/2022 17:03   DG Chest Port 1 View  Result Date: 05/26/2022 CLINICAL DATA:  Concern for sepsis EXAM: PORTABLE CHEST 1 VIEW COMPARISON:  Chest x-ray dated April 09, 2022 FINDINGS: Cardiac and mediastinal contours are unchanged.  Small right-greater-than-left pleural effusions and bibasilar atelectasis, similar to prior. No evidence of pneumothorax. Scoliosis. IMPRESSION: Small right-greater-than-left pleural effusions and bibasilar atelectasis Electronically Signed   By: Yetta Glassman M.D.   On: 05/26/2022 13:37       The results of significant diagnostics from this hospitalization (including imaging, microbiology, ancillary and laboratory) are listed below for reference.     Microbiology: Recent Results (from the past 240 hour(s))  Blood Culture (routine x 2)     Status: None   Collection Time: 05/26/22  1:28 PM   Specimen: BLOOD  Result Value Ref Range Status   Specimen Description BLOOD BLOOD LEFT ARM  Final   Special Requests   Final    BOTTLES DRAWN AEROBIC AND ANAEROBIC Blood Culture adequate volume   Culture   Final  NO GROWTH 5 DAYS Performed at [facility], [address] [place], [place] [address]    Report Status 05/31/2022 FINAL  Final  Blood Culture (routine x 2)     Status: None   Collection Time: 05/26/22  1:28 PM   Specimen: BLOOD  Result Value Ref Range Status   Specimen Description BLOOD BLOOD RIGHT ARM  Final   Special Requests   Final    BOTTLES DRAWN AEROBIC AND ANAEROBIC Blood Culture adequate volume   Culture   Final    NO GROWTH 5 DAYS Performed at [facility], [address] [place], [place] [address]    Report Status 05/31/2022 FINAL  Final  Resp panel by RT-PCR (RSV, Flu A&B, Covid) Anterior Nasal Swab     Status: Abnormal   Collection Time: 05/26/22  1:28 PM   Specimen: Anterior Nasal Swab  Result Value Ref Range Status   SARS Coronavirus 2 by RT PCR POSITIVE (A) NEGATIVE Final    Comment: (NOTE) SARS-CoV-2 target nucleic acids are DETECTED.  The SARS-CoV-2 RNA is generally detectable in upper respiratory specimens during the acute phase of infection. Positive results are indicative of the presence of the identified virus, but do not  rule out bacterial infection or co-infection with other pathogens not detected by the test. Clinical correlation with patient history and other diagnostic information is necessary to determine patient infection status. The expected result is Negative.  Fact Sheet for Patients: EntrepreneurPulse.com.au  Fact Sheet for Healthcare Providers: IncredibleEmployment.be  This test is not yet approved or cleared by the [place] FDA and  has been authorized for detection and/or diagnosis of SARS-CoV-2 by FDA under an Emergency Use Authorization (EUA).  This EUA will remain in effect (meaning this test can be used) for the duration of  the COVID-19 declaration under Section 564(b)(1) of the A ct, 21 U.S.C. section 360bbb-3(b)(1), unless the authorization is terminated or revoked sooner.     Influenza A by PCR NEGATIVE NEGATIVE Final   Influenza B by PCR NEGATIVE NEGATIVE Final    Comment: (NOTE) The Xpert Xpress SARS-CoV-2/FLU/RSV plus assay is intended as an aid in the diagnosis of influenza from Nasopharyngeal swab specimens and should not be used as a sole basis for treatment. Nasal washings and aspirates are unacceptable for Xpert Xpress SARS-CoV-2/FLU/RSV testing.  Fact Sheet for Patients: EntrepreneurPulse.com.au  Fact Sheet for Healthcare Providers: IncredibleEmployment.be  This test is not yet approved or cleared by the [place] FDA and has been authorized for detection and/or diagnosis of SARS-CoV-2 by FDA under an Emergency Use Authorization (EUA). This EUA will remain in effect (meaning this test can be used) for the duration of the COVID-19 declaration under Section 564(b)(1) of the Act, 21 U.S.C. section 360bbb-3(b)(1), unless the authorization is terminated or revoked.     Resp Syncytial Virus by PCR NEGATIVE NEGATIVE Final    Comment: (NOTE) Fact Sheet for  Patients: EntrepreneurPulse.com.au  Fact Sheet for Healthcare Providers: IncredibleEmployment.be  This test is not yet approved or cleared by the [place] FDA and has been authorized for detection and/or diagnosis of SARS-CoV-2 by FDA under an Emergency Use Authorization (EUA). This EUA will remain in effect (meaning this test can be used) for the duration of the COVID-19 declaration under Section 564(b)(1) of the Act, 21 U.S.C. section 360bbb-3(b)(1), unless the authorization is terminated or revoked.  Performed at [facility], [address], [place], [place] [address]   MRSA Next Gen by PCR, Nasal  Status: None   Collection Time: 05/26/22  5:15 PM   Specimen: Nasal Mucosa; Nasal Swab  Result Value Ref Range Status   MRSA by PCR Next Gen NOT DETECTED NOT DETECTED Final    Comment: (NOTE) The GeneXpert MRSA Assay (FDA approved for NASAL specimens only), is one component of a comprehensive MRSA colonization surveillance program. It is not intended to diagnose MRSA infection nor to guide or monitor treatment for MRSA infections. Test performance is not FDA approved in patients less than 31 years old. Performed at [facility], [address] [place], [place] [address]      Labs:  CBC: Recent Labs  Lab 05/30/22 [phone] 05/31/22 [id number] 06/01/22 0519 06/02/22 0406 06/03/22 0356  WBC 12.6* 19.0* 23.2* 15.9* 15.2*  NEUTROABS  --   --   --  14.3*  --   HGB 8.2* 8.5* 7.8* 7.7* 7.6*  HCT 26.3* 26.9* 24.6* 24.5* 23.8*  MCV 84.3 84.3 83.7 84.2 83.5  PLT 532* 618* 613* 636* 685*   BMP &GFR Recent Labs  Lab 05/28/22 0336 05/29/22 0713 05/30/22 0620 05/31/22 0648 06/01/22 0519 06/02/22 0406 06/02/22 0407  NA 137 137 135 136 133*  --  135  K 4.1 4.3 4.3 4.6 3.9  --  4.1  CL 102 101 98 96* 96*  --  97*  CO2 28 31 29 $ 32 30  --  32  GLUCOSE 87 80 97 70 99  --  117*  BUN 19 24* 28* 26* 28*  --  29*  CREATININE  0.83 0.90 0.85 0.85 0.78  --  0.71  CALCIUM 7.4* 7.7* 7.9* 8.1* 7.8*  --  8.1*  MG 1.9 2.1  --   --   --  2.3  --   PHOS 2.8 3.7  --   --   --   --  4.2   Estimated Creatinine Clearance: 46.6 mL/min (by C-G formula based on SCr of 0.71 mg/dL). Liver & Pancreas: Recent Labs  Lab 05/28/22 0336 05/29/22 0713 06/01/22 0831 06/02/22 0407  AST 33 20 21  --   ALT 29 24 19  $ --   ALKPHOS 60 62 63  --   BILITOT 0.6 0.5 0.6  --   PROT 5.5* 5.4* 5.6*  --   ALBUMIN 2.3* 2.3* 2.3* 2.4*   No results for input(s): "LIPASE", "AMYLASE" in the last 168 hours. No results for input(s): "AMMONIA" in the last 168 hours. Diabetic: No results for input(s): "HGBA1C" in the last 72 hours. No results for input(s): "GLUCAP" in the last 168 hours. Cardiac Enzymes: No results for input(s): "CKTOTAL", "CKMB", "CKMBINDEX", "TROPONINI" in the last 168 hours. No results for input(s): "PROBNP" in the last 8760 hours. Coagulation Profile: No results for input(s): "INR", "PROTIME" in the last 168 hours. Thyroid Function Tests: No results for input(s): "TSH", "T4TOTAL", "FREET4", "T3FREE", "THYROIDAB" in the last 72 hours. Lipid Profile: No results for input(s): "CHOL", "HDL", "LDLCALC", "TRIG", "CHOLHDL", "LDLDIRECT" in the last 72 hours. Anemia Panel: No results for input(s): "VITAMINB12", "FOLATE", "FERRITIN", "TIBC", "IRON", "RETICCTPCT" in the last 72 hours. Urine analysis:    Component Value Date/Time   COLORURINE YELLOW (A) 05/30/2022 0015   APPEARANCEUR HAZY (A) 05/30/2022 0015   APPEARANCEUR Clear 02/01/2014 1824   LABSPEC 1.021 05/30/2022 0015   LABSPEC 1.006 02/01/2014 1824   PHURINE 5.0 05/30/2022 0015   GLUCOSEU NEGATIVE 05/30/2022 0015   GLUCOSEU Negative 02/01/2014 1824   HGBUR NEGATIVE 05/30/2022 0015   BILIRUBINUR NEGATIVE 05/30/2022 0015  BILIRUBINUR Negative 02/01/2014 1824   KETONESUR NEGATIVE 05/30/2022 0015   PROTEINUR NEGATIVE 05/30/2022 0015   NITRITE NEGATIVE 05/30/2022 0015    LEUKOCYTESUR NEGATIVE 05/30/2022 0015   LEUKOCYTESUR Negative 02/01/2014 1824   Sepsis Labs: Invalid input(s): "PROCALCITONIN", "LACTICIDVEN"   SIGNED:  Mercy Riding, MD  [facility] 06/03/2022, 12:31 PM

## 2022-06-03 NOTE — TOC Transition Note (Signed)
Transition of Care [facility]) - CM/SW Discharge Note   Patient Details  Name: Lucas Ramirez MRN: [id number] Date of Birth: Nov 13, 1955  [facility]) CM/SW Contact:  Laurena Slimmer, RN Phone Number: 06/03/2022, 11:17 AM   Clinical Narrative:    EMS arranged.   TOC signing off.    Final next level of care: Home w Hospice Care Barriers to Discharge: Barriers Resolved   Patient Goals and CMS Choice CMS Medicare.gov Compare Post Acute Care list provided to:: Patient Choice offered to / list presented to : Patient, Spouse  Discharge Placement                  Patient to be transferred to facility by: [facility]      Discharge Plan and Services Additional resources added to the After Visit Summary for     Discharge Planning Services: CM Consult Post Acute Care Choice: Hospice                    HH Arranged: RN, PT [facility] Agency: [place] Date [place]: 05/27/22 Time [place]: [id number] Representative spoke with at [place]: [place] ([place]) Interventions SDOH Screenings   Food Insecurity: No Food Insecurity (05/24/2022)  Housing: Low Risk  (05/24/2022)  Transportation Needs: No Transportation Needs (05/24/2022)  Utilities: Not At Risk (01/24/2022)  Alcohol Screen: Low Risk  (05/06/2022)  Depression (PHQ2-9): Low Risk  (05/24/2022)  Recent Concern: Depression (PHQ2-9) - Medium Risk (05/06/2022)  Financial Resource Strain: Low Risk  (01/24/2022)  Physical Activity: Inactive (01/24/2022)  Social Connections: Moderately Isolated (01/24/2022)  Stress: No Stress Concern Present (01/24/2022)  Tobacco Use: High Risk (05/24/2022)     Readmission Risk Interventions     No data to display

## 2022-06-03 NOTE — TOC Transition Note (Signed)
Transition of Care [facility]) - CM/SW Discharge Note   Patient Details  Name: Lucas Ramirez MRN: [id number] Date of Birth: [birth year]  [facility] of [facility]) CM/SW Contact:  Gerilyn Pilgrim, LCSW Phone Number: 06/03/2022, 9:09 AM   Clinical Narrative:  Pt has orders to discharge home with home hospice with [facility]. Blanca Friend asks that transport be set up around 11 am so that they can ensure the hospital bed and oxygen are at the pt's home. Medical necessity form printed to unit. CSW will call [facility] for transport home around 11 am.     Final next level of care: Home w Hospice Care Barriers to Discharge: Barriers Resolved   Patient Goals and CMS Choice CMS Medicare.gov Compare Post Acute Care list provided to:: Patient Choice offered to / list presented to : Patient, Spouse  Discharge Placement                  Patient to be transferred to facility by: [facility]      Discharge Plan and Services Additional resources added to the After Visit Summary for     Discharge Planning Services: CM Consult Post Acute Care Choice: Hospice                    HH Arranged: RN, PT [facility] Agency: [place] Date [place]: 05/27/22 Time [place]: [id number] Representative spoke with at [place]: [place] ([place]) Interventions SDOH Screenings   Food Insecurity: No Food Insecurity (05/24/2022)  Housing: Low Risk  (05/24/2022)  Transportation Needs: No Transportation Needs (05/24/2022)  Utilities: Not At Risk (01/24/2022)  Alcohol Screen: Low Risk  (05/06/2022)  Depression (PHQ2-9): Low Risk  (05/24/2022)  Recent Concern: Depression (PHQ2-9) - Medium Risk (05/06/2022)  Financial Resource Strain: Low Risk  (01/24/2022)  Physical Activity: Inactive (01/24/2022)  Social Connections: Moderately Isolated (01/24/2022)  Stress: No Stress Concern Present (01/24/2022)  Tobacco Use: High Risk (05/24/2022)     Readmission Risk Interventions     No data  to display

## 2022-06-03 NOTE — Telephone Encounter (Signed)
TC to spouse to follow up on patient. Call answered by another gentleman, whom shared that patient was anticipated to DC from hospital today and is expected to arrive via ambulance shortly.   SW will attempt to f/u with spouse again on Monday to schedule PC visit.

## 2022-06-06 ENCOUNTER — Telehealth: Payer: Self-pay

## 2022-06-16 ENCOUNTER — Telehealth: Payer: Self-pay | Admitting: Oncology

## 2022-06-16 NOTE — Telephone Encounter (Signed)
LVM for pt to give [place] a call to see if he can come in at 10:15 am on 3/1 instead of 12:15

## 2022-06-24 ENCOUNTER — Inpatient Hospital Stay: Payer: Medicare PPO | Attending: Oncology | Admitting: Oncology

## 2022-06-24 NOTE — Telephone Encounter (Signed)
Telephone outreach to patients spouse to schedule initial palliative care visit/consult. Spouse declined due to patient being followed by [facility].   Palliative care referral will be canceled.

## 2022-06-24 NOTE — Assessment & Plan Note (Deleted)
Possibly reactive due to chronic smoking.  Rule out other etiologies. Previous labs were reviewed and discussed with patient.  Check CBC, smear, LDH, peripheral blood flow cytometry, monoclonal gammopathy evaluation, hepatitis panel, JAK2 mutation with reflex, BCR-ABL 1 FISH

## 2022-06-24 DEATH — deceased

## 2022-06-27 ENCOUNTER — Other Ambulatory Visit (INDEPENDENT_AMBULATORY_CARE_PROVIDER_SITE_OTHER): Payer: Self-pay | Admitting: Nurse Practitioner

## 2022-06-27 DIAGNOSIS — I6523 Occlusion and stenosis of bilateral carotid arteries: Secondary | ICD-10-CM

## 2022-06-28 ENCOUNTER — Encounter (INDEPENDENT_AMBULATORY_CARE_PROVIDER_SITE_OTHER): Payer: Medicare PPO

## 2022-06-28 ENCOUNTER — Ambulatory Visit (INDEPENDENT_AMBULATORY_CARE_PROVIDER_SITE_OTHER): Payer: Medicare PPO | Admitting: Nurse Practitioner
